# Patient Record
Sex: Female | Born: 1940 | Race: White | Hispanic: Yes | Marital: Single | Smoking: Never smoker
Health system: Southern US, Community
[De-identification: ages and names within clinical notes are randomized; demographics above are authoritative.]

## PROBLEM LIST (undated history)

## (undated) DIAGNOSIS — E785 Hyperlipidemia, unspecified: Secondary | ICD-10-CM

## (undated) DIAGNOSIS — H409 Unspecified glaucoma: Secondary | ICD-10-CM

## (undated) DIAGNOSIS — T4145XA Adverse effect of unspecified anesthetic, initial encounter: Secondary | ICD-10-CM

## (undated) DIAGNOSIS — T8859XA Other complications of anesthesia, initial encounter: Secondary | ICD-10-CM

## (undated) DIAGNOSIS — K219 Gastro-esophageal reflux disease without esophagitis: Secondary | ICD-10-CM

## (undated) DIAGNOSIS — N189 Chronic kidney disease, unspecified: Secondary | ICD-10-CM

## (undated) DIAGNOSIS — M109 Gout, unspecified: Secondary | ICD-10-CM

## (undated) DIAGNOSIS — I10 Essential (primary) hypertension: Secondary | ICD-10-CM

## (undated) DIAGNOSIS — M1990 Unspecified osteoarthritis, unspecified site: Secondary | ICD-10-CM

## (undated) HISTORY — PX: BREAST SURGERY: SHX581

---

## 2004-07-12 ENCOUNTER — Ambulatory Visit: Payer: Self-pay

## 2004-11-08 ENCOUNTER — Ambulatory Visit: Payer: Self-pay | Admitting: Gastroenterology

## 2005-06-24 ENCOUNTER — Ambulatory Visit: Payer: Self-pay

## 2006-01-01 ENCOUNTER — Ambulatory Visit: Payer: Self-pay | Admitting: Family Medicine

## 2008-08-26 ENCOUNTER — Emergency Department: Payer: Self-pay | Admitting: Emergency Medicine

## 2008-08-30 ENCOUNTER — Ambulatory Visit: Payer: Self-pay | Admitting: Nephrology

## 2008-09-30 HISTORY — PX: BREAST EXCISIONAL BIOPSY: SUR124

## 2009-01-26 ENCOUNTER — Ambulatory Visit: Payer: Self-pay | Admitting: Family Medicine

## 2009-02-01 ENCOUNTER — Ambulatory Visit: Payer: Self-pay | Admitting: Family Medicine

## 2009-03-09 ENCOUNTER — Ambulatory Visit: Payer: Self-pay | Admitting: Surgery

## 2009-03-10 ENCOUNTER — Ambulatory Visit: Payer: Self-pay | Admitting: Gastroenterology

## 2009-03-14 ENCOUNTER — Ambulatory Visit: Payer: Self-pay | Admitting: Surgery

## 2009-07-04 DIAGNOSIS — R42 Dizziness and giddiness: Secondary | ICD-10-CM | POA: Insufficient documentation

## 2010-03-20 ENCOUNTER — Ambulatory Visit: Payer: Self-pay | Admitting: Family

## 2011-01-10 ENCOUNTER — Emergency Department: Payer: Self-pay | Admitting: Internal Medicine

## 2011-05-31 ENCOUNTER — Ambulatory Visit: Payer: Self-pay | Admitting: Family Medicine

## 2011-08-29 ENCOUNTER — Ambulatory Visit: Payer: Self-pay | Admitting: Family Medicine

## 2011-09-03 ENCOUNTER — Ambulatory Visit: Payer: Self-pay

## 2012-12-22 ENCOUNTER — Ambulatory Visit: Payer: Self-pay | Admitting: Primary Care

## 2013-12-01 DIAGNOSIS — L723 Sebaceous cyst: Secondary | ICD-10-CM | POA: Insufficient documentation

## 2013-12-21 DIAGNOSIS — R42 Dizziness and giddiness: Secondary | ICD-10-CM | POA: Insufficient documentation

## 2013-12-23 ENCOUNTER — Ambulatory Visit: Payer: Self-pay | Admitting: Primary Care

## 2015-03-14 ENCOUNTER — Ambulatory Visit
Admission: RE | Admit: 2015-03-14 | Discharge: 2015-03-14 | Disposition: A | Discharge status: Home / Self Care | Payer: Medicare Other | Source: Ambulatory Visit | Attending: Primary Care | Admitting: Primary Care

## 2015-03-14 ENCOUNTER — Ambulatory Visit
Admission: RE | Admit: 2015-03-14 | Discharge: 2015-03-14 | Disposition: A | Discharge status: Home / Self Care | Payer: Medicare Other | Source: Ambulatory Visit | Attending: Physician Assistant | Admitting: Physician Assistant

## 2015-03-14 ENCOUNTER — Other Ambulatory Visit: Payer: Self-pay | Admitting: Physician Assistant

## 2015-03-14 DIAGNOSIS — M25562 Pain in left knee: Secondary | ICD-10-CM

## 2015-03-14 DIAGNOSIS — M1712 Unilateral primary osteoarthritis, left knee: Secondary | ICD-10-CM | POA: Diagnosis not present

## 2015-03-14 DIAGNOSIS — M25462 Effusion, left knee: Secondary | ICD-10-CM | POA: Diagnosis not present

## 2015-03-23 DIAGNOSIS — M810 Age-related osteoporosis without current pathological fracture: Secondary | ICD-10-CM | POA: Insufficient documentation

## 2015-03-28 ENCOUNTER — Other Ambulatory Visit: Payer: Self-pay | Admitting: Primary Care

## 2015-03-28 DIAGNOSIS — M810 Age-related osteoporosis without current pathological fracture: Secondary | ICD-10-CM

## 2015-04-04 ENCOUNTER — Ambulatory Visit
Admission: RE | Admit: 2015-04-04 | Discharge: 2015-04-04 | Disposition: A | Discharge status: Home / Self Care | Payer: Medicare Other | Source: Ambulatory Visit | Attending: Primary Care | Admitting: Primary Care

## 2015-04-04 DIAGNOSIS — Z13820 Encounter for screening for osteoporosis: Secondary | ICD-10-CM | POA: Diagnosis not present

## 2015-04-04 DIAGNOSIS — M85852 Other specified disorders of bone density and structure, left thigh: Secondary | ICD-10-CM | POA: Diagnosis not present

## 2015-04-04 DIAGNOSIS — M810 Age-related osteoporosis without current pathological fracture: Secondary | ICD-10-CM

## 2015-04-06 DIAGNOSIS — M5489 Other dorsalgia: Secondary | ICD-10-CM | POA: Insufficient documentation

## 2015-05-17 ENCOUNTER — Other Ambulatory Visit: Payer: Self-pay | Admitting: Orthopedic Surgery

## 2015-05-17 DIAGNOSIS — M25562 Pain in left knee: Secondary | ICD-10-CM

## 2015-05-17 DIAGNOSIS — M1712 Unilateral primary osteoarthritis, left knee: Secondary | ICD-10-CM

## 2015-05-23 ENCOUNTER — Ambulatory Visit
Admission: RE | Admit: 2015-05-23 | Discharge: 2015-05-23 | Disposition: A | Discharge status: Home / Self Care | Payer: Medicare Other | Source: Ambulatory Visit | Attending: Orthopedic Surgery | Admitting: Orthopedic Surgery

## 2015-05-23 DIAGNOSIS — S83282A Other tear of lateral meniscus, current injury, left knee, initial encounter: Secondary | ICD-10-CM | POA: Diagnosis not present

## 2015-05-23 DIAGNOSIS — M25562 Pain in left knee: Secondary | ICD-10-CM | POA: Diagnosis present

## 2015-05-23 DIAGNOSIS — M1712 Unilateral primary osteoarthritis, left knee: Secondary | ICD-10-CM

## 2015-05-23 DIAGNOSIS — X58XXXA Exposure to other specified factors, initial encounter: Secondary | ICD-10-CM | POA: Diagnosis not present

## 2015-05-23 DIAGNOSIS — M25462 Effusion, left knee: Secondary | ICD-10-CM | POA: Diagnosis not present

## 2015-05-23 DIAGNOSIS — S83242A Other tear of medial meniscus, current injury, left knee, initial encounter: Secondary | ICD-10-CM | POA: Insufficient documentation

## 2015-06-21 DIAGNOSIS — L710 Perioral dermatitis: Secondary | ICD-10-CM | POA: Insufficient documentation

## 2015-09-07 ENCOUNTER — Other Ambulatory Visit: Payer: Self-pay | Admitting: Primary Care

## 2015-09-07 DIAGNOSIS — Z1231 Encounter for screening mammogram for malignant neoplasm of breast: Secondary | ICD-10-CM

## 2015-09-19 ENCOUNTER — Other Ambulatory Visit: Payer: Self-pay | Admitting: Primary Care

## 2015-09-19 ENCOUNTER — Ambulatory Visit
Admission: RE | Admit: 2015-09-19 | Discharge: 2015-09-19 | Disposition: A | Discharge status: Home / Self Care | Payer: Medicare Other | Source: Ambulatory Visit | Attending: Primary Care | Admitting: Primary Care

## 2015-09-19 DIAGNOSIS — Z1231 Encounter for screening mammogram for malignant neoplasm of breast: Secondary | ICD-10-CM

## 2015-09-27 ENCOUNTER — Inpatient Hospital Stay: Admission: RE | Admit: 2015-09-27 | Payer: Medicare Other | Source: Ambulatory Visit

## 2015-10-04 ENCOUNTER — Other Ambulatory Visit: Payer: Self-pay

## 2015-10-04 ENCOUNTER — Encounter
Admission: RE | Admit: 2015-10-04 | Discharge: 2015-10-04 | Disposition: A | Discharge status: Home / Self Care | Payer: Medicare Other | Source: Ambulatory Visit | Attending: Orthopedic Surgery | Admitting: Orthopedic Surgery

## 2015-10-04 DIAGNOSIS — M23201 Derangement of unspecified lateral meniscus due to old tear or injury, left knee: Secondary | ICD-10-CM | POA: Diagnosis not present

## 2015-10-04 DIAGNOSIS — M23222 Derangement of posterior horn of medial meniscus due to old tear or injury, left knee: Secondary | ICD-10-CM | POA: Diagnosis not present

## 2015-10-04 DIAGNOSIS — K219 Gastro-esophageal reflux disease without esophagitis: Secondary | ICD-10-CM | POA: Diagnosis not present

## 2015-10-04 DIAGNOSIS — Z79899 Other long term (current) drug therapy: Secondary | ICD-10-CM | POA: Diagnosis not present

## 2015-10-04 DIAGNOSIS — E785 Hyperlipidemia, unspecified: Secondary | ICD-10-CM | POA: Diagnosis not present

## 2015-10-04 DIAGNOSIS — M23232 Derangement of other medial meniscus due to old tear or injury, left knee: Secondary | ICD-10-CM | POA: Diagnosis not present

## 2015-10-04 DIAGNOSIS — Z881 Allergy status to other antibiotic agents status: Secondary | ICD-10-CM | POA: Diagnosis not present

## 2015-10-04 DIAGNOSIS — M1712 Unilateral primary osteoarthritis, left knee: Secondary | ICD-10-CM | POA: Diagnosis not present

## 2015-10-04 DIAGNOSIS — I10 Essential (primary) hypertension: Secondary | ICD-10-CM | POA: Diagnosis not present

## 2015-10-04 DIAGNOSIS — Z888 Allergy status to other drugs, medicaments and biological substances status: Secondary | ICD-10-CM | POA: Diagnosis not present

## 2015-10-04 DIAGNOSIS — S83012A Lateral subluxation of left patella, initial encounter: Secondary | ICD-10-CM | POA: Diagnosis not present

## 2015-10-04 DIAGNOSIS — M179 Osteoarthritis of knee, unspecified: Secondary | ICD-10-CM | POA: Diagnosis present

## 2015-10-04 DIAGNOSIS — X58XXXA Exposure to other specified factors, initial encounter: Secondary | ICD-10-CM | POA: Diagnosis not present

## 2015-10-04 HISTORY — DX: Adverse effect of unspecified anesthetic, initial encounter: T41.45XA

## 2015-10-04 HISTORY — DX: Essential (primary) hypertension: I10

## 2015-10-04 HISTORY — DX: Gastro-esophageal reflux disease without esophagitis: K21.9

## 2015-10-04 HISTORY — DX: Chronic kidney disease, unspecified: N18.9

## 2015-10-04 HISTORY — DX: Hyperlipidemia, unspecified: E78.5

## 2015-10-04 HISTORY — DX: Other complications of anesthesia, initial encounter: T88.59XA

## 2015-10-04 LAB — CBC
HCT: 33.6 % — ABNORMAL LOW (ref 35.0–47.0)
Hemoglobin: 11.2 g/dL — ABNORMAL LOW (ref 12.0–16.0)
MCH: 28 pg (ref 26.0–34.0)
MCHC: 33.5 g/dL (ref 32.0–36.0)
MCV: 83.5 fL (ref 80.0–100.0)
PLATELETS: 219 10*3/uL (ref 150–440)
RBC: 4.02 MIL/uL (ref 3.80–5.20)
RDW: 13.8 % (ref 11.5–14.5)
WBC: 7.6 10*3/uL (ref 3.6–11.0)

## 2015-10-04 NOTE — Patient Instructions (Signed)
  Your procedure is scheduled on: 10/05/15 Thurs Report to Day Surgery.2nd floor medical mall   Remember: Instructions that are not followed completely may result in serious medical risk, up to and including death, or upon the discretion of your surgeon and anesthesiologist your surgery may need to be rescheduled.    _x___ 1. Do not eat food or drink liquids after midnight. No gum chewing or hard candies.     ____ 2. No Alcohol for 24 hours before or after surgery.   ____ 3. Bring all medications with you on the day of surgery if instructed.    _x___ 4. Notify your doctor if there is any change in your medical condition     (cold, fever, infections).     Do not wear jewelry, make-up, hairpins, clips or nail polish.  Do not wear lotions, powders, or perfumes. You may wear deodorant.  Do not shave 48 hours prior to surgery. Men may shave face and neck.  Do not bring valuables to the hospital.    [facility] is not responsible for any belongings or valuables.               Contacts, dentures or bridgework may not be worn into surgery.  Leave your suitcase in the car. After surgery it may be brought to your room.  For patients admitted to the hospital, discharge time is determined by your                treatment team.   Patients discharged the day of surgery will not be allowed to drive home.   Please read over the following fact sheets that you were given:      _x___ Take these medicines the morning of surgery with A SIP OF WATER:    1. diltiazem (DILACOR XR) 240 MG 24 hr capsule  2. omeprazole (PRILOSEC) 40 MG capsule  3.   4.  5.  6.  ____ Fleet Enema (as directed)   _x___ Use CHG Soap as directed  ____ Use inhalers on the day of surgery  ____ Stop metformin 2 days prior to surgery    ____ Take 1/2 of usual insulin dose the night before surgery and none on the morning of surgery.   ____ Stop Coumadin/Plavix/aspirin on   __x__ Stop Anti-inflammatories on  today   __x__ Stop supplements until after surgery.    ____ Bring C-Pap to the hospital.

## 2015-10-05 ENCOUNTER — Encounter
Admission: RE | Disposition: A | Discharge status: Home / Self Care | Payer: Self-pay | Source: Ambulatory Visit | Attending: Orthopedic Surgery

## 2015-10-05 ENCOUNTER — Ambulatory Visit: Payer: Medicare Other | Admitting: Anesthesiology

## 2015-10-05 ENCOUNTER — Ambulatory Visit
Admission: RE | Admit: 2015-10-05 | Discharge: 2015-10-05 | Disposition: A | Discharge status: Home / Self Care | Payer: Medicare Other | Source: Ambulatory Visit | Attending: Orthopedic Surgery | Admitting: Orthopedic Surgery

## 2015-10-05 DIAGNOSIS — M23201 Derangement of unspecified lateral meniscus due to old tear or injury, left knee: Secondary | ICD-10-CM | POA: Insufficient documentation

## 2015-10-05 DIAGNOSIS — X58XXXA Exposure to other specified factors, initial encounter: Secondary | ICD-10-CM | POA: Insufficient documentation

## 2015-10-05 DIAGNOSIS — Z881 Allergy status to other antibiotic agents status: Secondary | ICD-10-CM | POA: Insufficient documentation

## 2015-10-05 DIAGNOSIS — Z79899 Other long term (current) drug therapy: Secondary | ICD-10-CM | POA: Insufficient documentation

## 2015-10-05 DIAGNOSIS — E785 Hyperlipidemia, unspecified: Secondary | ICD-10-CM | POA: Insufficient documentation

## 2015-10-05 DIAGNOSIS — M23232 Derangement of other medial meniscus due to old tear or injury, left knee: Secondary | ICD-10-CM | POA: Insufficient documentation

## 2015-10-05 DIAGNOSIS — M1712 Unilateral primary osteoarthritis, left knee: Secondary | ICD-10-CM | POA: Insufficient documentation

## 2015-10-05 DIAGNOSIS — K219 Gastro-esophageal reflux disease without esophagitis: Secondary | ICD-10-CM | POA: Insufficient documentation

## 2015-10-05 DIAGNOSIS — I10 Essential (primary) hypertension: Secondary | ICD-10-CM | POA: Insufficient documentation

## 2015-10-05 DIAGNOSIS — Z888 Allergy status to other drugs, medicaments and biological substances status: Secondary | ICD-10-CM | POA: Insufficient documentation

## 2015-10-05 DIAGNOSIS — M23222 Derangement of posterior horn of medial meniscus due to old tear or injury, left knee: Secondary | ICD-10-CM | POA: Insufficient documentation

## 2015-10-05 DIAGNOSIS — S83012A Lateral subluxation of left patella, initial encounter: Secondary | ICD-10-CM | POA: Insufficient documentation

## 2015-10-05 HISTORY — PX: KNEE ARTHROSCOPY: SHX127

## 2015-10-05 SURGERY — ARTHROSCOPY, KNEE
Anesthesia: General | Laterality: Left | Wound class: Clean

## 2015-10-05 MED ORDER — MIDAZOLAM HCL 2 MG/2ML IJ SOLN
INTRAMUSCULAR | Status: DC | PRN
  Administered 2015-10-05: 2 mg via INTRAVENOUS

## 2015-10-05 MED ORDER — LIDOCAINE HCL (CARDIAC) 20 MG/ML IV SOLN
INTRAVENOUS | Status: DC | PRN
  Administered 2015-10-05: 30 mg via INTRAVENOUS

## 2015-10-05 MED ORDER — DEXAMETHASONE SODIUM PHOSPHATE 10 MG/ML IJ SOLN
INTRAMUSCULAR | Status: DC | PRN
  Administered 2015-10-05: 4 mg via INTRAVENOUS

## 2015-10-05 MED ORDER — ONDANSETRON HCL 4 MG/2ML IJ SOLN: 4.0000 mg | Freq: Four times a day (QID) | INTRAMUSCULAR | Status: DC | PRN

## 2015-10-05 MED ORDER — PROMETHAZINE HCL 25 MG/ML IJ SOLN
INTRAMUSCULAR | Status: AC
  Filled 2015-10-05: qty 1

## 2015-10-05 MED ORDER — SODIUM CHLORIDE 0.9 % IJ SOLN
INTRAMUSCULAR | Status: AC
  Filled 2015-10-05: qty 10

## 2015-10-05 MED ORDER — KETOROLAC TROMETHAMINE 30 MG/ML IJ SOLN
INTRAMUSCULAR | Status: DC | PRN
  Administered 2015-10-05: 30 mg via INTRAVENOUS

## 2015-10-05 MED ORDER — BUPIVACAINE-EPINEPHRINE (PF) 0.5% -1:200000 IJ SOLN
INTRAMUSCULAR | Status: AC
  Filled 2015-10-05: qty 30

## 2015-10-05 MED ORDER — FENTANYL CITRATE (PF) 100 MCG/2ML IJ SOLN
INTRAMUSCULAR | Status: AC
  Administered 2015-10-05: 25 ug via INTRAVENOUS
  Filled 2015-10-05: qty 2

## 2015-10-05 MED ORDER — FENTANYL CITRATE (PF) 100 MCG/2ML IJ SOLN
INTRAMUSCULAR | Status: DC | PRN
  Administered 2015-10-05: 100 ug via INTRAVENOUS

## 2015-10-05 MED ORDER — ONDANSETRON HCL 4 MG PO TABS: 4.0000 mg | ORAL_TABLET | Freq: Four times a day (QID) | ORAL | Status: DC | PRN

## 2015-10-05 MED ORDER — HYDROCODONE-ACETAMINOPHEN 5-325 MG PO TABS: 1.0000 | ORAL_TABLET | ORAL | Status: DC | PRN

## 2015-10-05 MED ORDER — SODIUM CHLORIDE 0.9 % IV SOLN: INTRAVENOUS | Status: DC

## 2015-10-05 MED ORDER — METOCLOPRAMIDE HCL 5 MG/ML IJ SOLN: 5.0000 mg | Freq: Three times a day (TID) | INTRAMUSCULAR | Status: DC | PRN

## 2015-10-05 MED ORDER — LACTATED RINGERS IV SOLN
INTRAVENOUS | Status: DC
  Administered 2015-10-05 (×2): via INTRAVENOUS

## 2015-10-05 MED ORDER — FENTANYL CITRATE (PF) 100 MCG/2ML IJ SOLN
25.0000 ug | INTRAMUSCULAR | Status: DC | PRN
  Administered 2015-10-05 (×2): 25 ug via INTRAVENOUS

## 2015-10-05 MED ORDER — ONDANSETRON HCL 4 MG/2ML IJ SOLN
INTRAMUSCULAR | Status: DC | PRN
  Administered 2015-10-05: 4 mg via INTRAVENOUS

## 2015-10-05 MED ORDER — PROPOFOL 10 MG/ML IV BOLUS
INTRAVENOUS | Status: DC | PRN
  Administered 2015-10-05: 150 mg via INTRAVENOUS

## 2015-10-05 MED ORDER — HYDROCODONE-ACETAMINOPHEN 5-325 MG PO TABS: 1.0000 | ORAL_TABLET | Freq: Four times a day (QID) | ORAL | Status: DC | PRN

## 2015-10-05 MED ORDER — PROMETHAZINE HCL 25 MG/ML IJ SOLN
6.2500 mg | Freq: Once | INTRAMUSCULAR | Status: AC
  Administered 2015-10-05: 6.25 mg via INTRAVENOUS

## 2015-10-05 MED ORDER — PROMETHAZINE HCL 25 MG/ML IJ SOLN
INTRAMUSCULAR | Status: AC
  Administered 2015-10-05: 6.25 mg via INTRAVENOUS
  Filled 2015-10-05: qty 1

## 2015-10-05 MED ORDER — ONDANSETRON HCL 4 MG/2ML IJ SOLN
INTRAMUSCULAR | Status: AC
  Administered 2015-10-05: 4 mg via INTRAVENOUS
  Filled 2015-10-05: qty 2

## 2015-10-05 MED ORDER — ONDANSETRON HCL 4 MG/2ML IJ SOLN
4.0000 mg | Freq: Once | INTRAMUSCULAR | Status: AC | PRN
  Administered 2015-10-05: 4 mg via INTRAVENOUS

## 2015-10-05 MED ORDER — BUPIVACAINE-EPINEPHRINE (PF) 0.5% -1:200000 IJ SOLN
INTRAMUSCULAR | Status: DC | PRN
  Administered 2015-10-05: 30 mL

## 2015-10-05 MED ORDER — METOCLOPRAMIDE HCL 10 MG PO TABS: 5.0000 mg | ORAL_TABLET | Freq: Three times a day (TID) | ORAL | Status: DC | PRN

## 2015-10-05 SURGICAL SUPPLY — 28 items
BANDAGE ACE 4X5 VEL STRL LF (GAUZE/BANDAGES/DRESSINGS) ×3 IMPLANT
BANDAGE ELASTIC 4 LF NS (GAUZE/BANDAGES/DRESSINGS) ×3 IMPLANT
BLADE FULL RADIUS 3.5 (BLADE) IMPLANT
BLADE INCISOR PLUS 4.5 (BLADE) ×3 IMPLANT
BLADE SHAVER 4.5 DBL SERAT CV (CUTTER) IMPLANT
BLADE SHAVER 4.5X7 STR FR (MISCELLANEOUS) IMPLANT
CHLORAPREP W/TINT 26ML (MISCELLANEOUS) ×3 IMPLANT
CUTTER AGGRESSIVE+ 3.5 (CUTTER) IMPLANT
GAUZE PETRO XEROFOAM 1X8 (MISCELLANEOUS) ×3 IMPLANT
GAUZE SPONGE 4X4 12PLY STRL (GAUZE/BANDAGES/DRESSINGS) ×3 IMPLANT
GLOVE BIOGEL PI IND STRL 9 (GLOVE) ×1 IMPLANT
GLOVE BIOGEL PI INDICATOR 9 (GLOVE) ×2
GLOVE SURG ORTHO 9.0 STRL STRW (GLOVE) ×3 IMPLANT
GOWN SPECIALTY ULTRA XL (MISCELLANEOUS) ×3 IMPLANT
GOWN STRL REUS W/ TWL LRG LVL3 (GOWN DISPOSABLE) ×1 IMPLANT
GOWN STRL REUS W/TWL LRG LVL3 (GOWN DISPOSABLE) ×2
IV LACTATED RINGER IRRG 3000ML (IV SOLUTION) ×8
IV LR IRRIG 3000ML ARTHROMATIC (IV SOLUTION) ×4 IMPLANT
KIT RM TURNOVER STRD PROC AR (KITS) ×3 IMPLANT
MANIFOLD NEPTUNE II (INSTRUMENTS) ×3 IMPLANT
PACK ARTHROSCOPY KNEE (MISCELLANEOUS) ×3 IMPLANT
SET TUBE SUCT SHAVER OUTFL 24K (TUBING) ×3 IMPLANT
SET TUBE TIP INTRA-ARTICULAR (MISCELLANEOUS) ×3 IMPLANT
SUT ETHILON 4-0 (SUTURE) ×2
SUT ETHILON 4-0 FS2 18XMFL BLK (SUTURE) ×1
SUTURE ETHLN 4-0 FS2 18XMF BLK (SUTURE) ×1 IMPLANT
TUBING ARTHRO INFLOW-ONLY STRL (TUBING) ×3 IMPLANT
WAND HAND CNTRL MULTIVAC 50 (MISCELLANEOUS) ×3 IMPLANT

## 2015-10-05 NOTE — OR Nursing (Signed)
2+ pulses in both feet.

## 2015-10-05 NOTE — Transfer of Care (Signed)
Immediate Anesthesia Transfer of Care Note  Patient: Leah Davies  Procedure(s) Performed: Procedure(s): ARTHROSCOPY KNEE partial medial and lateral meniscectomy, lateral release for sublux patella (Left)  Patient Location: PACU  Anesthesia Type:General  Level of Consciousness: alert  and oriented  Airway & Oxygen Therapy: Patient Spontanous Breathing and Patient connected to face mask oxygen  Post-op Assessment: Report given to RN and Post -op Vital signs reviewed and stable  Post vital signs: Reviewed and stable  Last Vitals:  Filed Vitals:   10/05/15 1045 10/05/15 1415  BP: 141/59 114/52  Pulse: 80 68  Temp: 36.7 C 36.2 C  Resp: 16 16    Complications: No apparent anesthesia complications

## 2015-10-05 NOTE — Anesthesia Postprocedure Evaluation (Signed)
Anesthesia Post Note  Patient: Leah Davies  Procedure(s) Performed: Procedure(s) (LRB): ARTHROSCOPY KNEE partial medial and lateral meniscectomy, lateral release for sublux patella (Left)  Patient location during evaluation: PACU Anesthesia Type: General Level of consciousness: awake Pain management: pain level controlled Vital Signs Assessment: post-procedure vital signs reviewed and stable Respiratory status: spontaneous breathing Anesthetic complications: no    Last Vitals:  Filed Vitals:   10/05/15 1415 10/05/15 1430  BP: 114/52 133/57  Pulse: 68 67  Temp: 36.2 C   Resp: 16 15    Last Pain:  Filed Vitals:   10/05/15 1436  PainSc: Asleep                 VAN STAVEREN,Abbagail Scaff

## 2015-10-05 NOTE — Discharge Instructions (Addendum)
Keep dressing clean and dry. Weight-bear as tolerated on left leg Take one aspirin a day 81 or 325 mg.  Mantenga el vendaje limpio y seco.  Puede poner peso en la pierna izquierda segn lo tolere. Tome una aspirina de 81 o 325 mg. cada da.    Anestesia general, adultos, cuidados posteriores (General Anesthesia, Adult, Care After) Siga estas instrucciones durante las prximas semanas. Estas indicaciones le proporcionan informacin acerca de cmo deber cuidarse despus del procedimiento. El mdico tambin podr darle instrucciones ms especficas. El tratamiento se ha planificado de acuerdo a las prcticas mdicas actuales, pero a veces se producen problemas. Comunquese con el mdico si tiene algn problema o tiene dudas despus del procedimiento. QU ESPERAR DESPUS DEL PROCEDIMIENTO Despus del procedimiento es habitual experimentar:  Somnolencia.  Nuseas y vmitos. INSTRUCCIONES PARA EL CUIDADO EN EL HOGAR  Durante las primeras 24 horas luego de la anestesia general:  Haga que una persona responsable se quede con usted.  No conduzca un automvil. Si est solo, no viaje en transporte pblico.  No beba alcohol.  No tome medicamentos que no le haya recetado su mdico.  No firme documentos importantes ni tome decisiones trascendentes.  Puede reanudar su dieta y sus actividades normales segn le haya indicado el mdico.  Cambie los vendajes (apsitos) tal como se le indic.  Si tiene preguntas o se le presenta algn problema relacionado con la anestesia general, comunquese con el hospital y pida por el anestesista o anestesilogo de [place]. SOLICITE ATENCIN MDICA SI:  Tiene nuseas y [place] posterior a la anestesia.  Le aparece una erupcin cutnea. SOLICITE ATENCIN MDICA DE INMEDIATO SI:   Tiene dificultad para respirar.  Siente dolor en el pecho.  Tiene algn problema alrgico.   Esta informacin no tiene como fin reemplazar el consejo del  mdico. Asegrese de hacerle al mdico cualquier pregunta que tenga.   Document Released: 09/16/2005 Document Revised: 10/07/2014 Elsevier Interactive Patient Education [employer].

## 2015-10-05 NOTE — Anesthesia Preprocedure Evaluation (Addendum)
Anesthesia Evaluation  Patient identified by MRN, date of birth, ID band Patient awake    Reviewed: Allergy & Precautions, NPO status , Patient's Chart, lab work & pertinent test results  Airway Mallampati: III       Dental  (+) Implants   Pulmonary neg pulmonary ROS,    Pulmonary exam normal        Cardiovascular Exercise Tolerance: Good hypertension, Pt. on medications  Rhythm:Regular Rate:Normal     Neuro/Psych negative neurological ROS     GI/Hepatic Neg liver ROS, GERD  ,  Endo/Other  negative endocrine ROS  Renal/GU      Musculoskeletal   Abdominal Normal abdominal exam  (+)   Peds  Hematology negative hematology ROS (+)   Anesthesia Other Findings   Reproductive/Obstetrics                            Anesthesia Physical Anesthesia Plan  ASA: III  Anesthesia Plan: General   Post-op Pain Management:    Induction: Intravenous  Airway Management Planned: LMA  Additional Equipment:   Intra-op Plan:   Post-operative Plan: Extubation in OR  Informed Consent: I have reviewed the patients History and Physical, chart, labs and discussed the procedure including the risks, benefits and alternatives for the proposed anesthesia with the patient or authorized representative who has indicated his/her understanding and acceptance.     Plan Discussed with: CRNA  Anesthesia Plan Comments:         Anesthesia Quick Evaluation

## 2015-10-05 NOTE — OR Nursing (Signed)
Dr Rudene Christians in discussed consent for blood transfusion refusal. Patient and family consent to no blood products because she is Jehovah Witness.  Consent signed.

## 2015-10-05 NOTE — H&P (Signed)
Reviewed paper H+P, will be scanned into chart. No changes noted.  

## 2015-10-05 NOTE — Op Note (Signed)
10/05/2015  2:16 PM  PATIENT:  Leah Davies  75 y.o. 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 PROCEDURE:  Procedure(s): ARTHROSCOPY KNEE partial medial and lateral meniscectomy, lateral release for sublux patella (Left)  SURGEON: Laurene Footman, MD  ASSISTANTS: None  ANESTHESIA:   general  EBL:  Total I/O In: 700 [I.V.:700] Out: -   BLOOD ADMINISTERED:none  DRAINS: none   LOCAL MEDICATIONS USED:  MARCAINE     SPECIMEN:  No Specimen  DISPOSITION OF SPECIMEN:  N/A  COUNTS:  YES  TOURNIQUET:   13 minutes at 300 mmHg  IMPLANTS: None  DICTATION: .Dragon Dictation patient brought the operating room and after adequate general anesthesia was obtained the left leg was placed in the arthroscopic leg holder with a tourniquet and the leg prepped and draped in the usual sterile fashion. After appropriate patient identification and timeout procedures were completed, an inferolateral portal was made and on and initial inspection the patella was subluxed laterally with a tight lateral retinaculum with moderate central osteoarthritis with fissuring down to the bone. Coming around medially and inferior medial portal was made on probing there is a complex tear of the posterior and middle thirds of the meniscus with both radial and horizontal tears involving the inner half of the meniscus. The articular cartilage showed fissuring and grade 2-3 changes but no bone exposed. The notch showed anterior cruciate ligament intact with moderate synovitis. Going laterally there is a very complex tear there is of the lateral meniscus and had a  partial discoid appearance and had detachment in its midportion off the capsule so a partial meniscectomy was carried out resecting most of the middle third of the meniscus. The articular cartilage is relatively spared a few  areas of articular cartilage loss. At this point the lateral release was carried out and the patella tracked much better after lateral release. The knee was thoroughly irrigated until clear. Tourniquet was raised prior to the lateral release to allow for better visualization and tourniquet was let down prior to close the case. Wounds were closed with simple 4-0 nylon followed by injection of a total of 30 cc half percent Sensorcaine with epinephrine into the portals and area of the lateral release. Xeroform 4 x 4 web roll and Ace wrap applied   PLAN OF CARE: Discharge to home after PACU  PATIENT DISPOSITION:  PACU - hemodynamically stable.

## 2015-10-06 ENCOUNTER — Encounter: Payer: Self-pay | Admitting: Orthopedic Surgery

## 2016-06-26 DIAGNOSIS — R140 Abdominal distension (gaseous): Secondary | ICD-10-CM | POA: Insufficient documentation

## 2016-10-07 ENCOUNTER — Encounter: Payer: Self-pay | Admitting: Emergency Medicine

## 2016-10-07 ENCOUNTER — Inpatient Hospital Stay
Admission: EM | Admit: 2016-10-07 | Discharge: 2016-10-09 | DRG: 683 | Disposition: A | Discharge status: Home / Self Care | Payer: Medicare Other | Attending: Internal Medicine | Admitting: Internal Medicine

## 2016-10-07 ENCOUNTER — Emergency Department: Payer: Medicare Other

## 2016-10-07 ENCOUNTER — Inpatient Hospital Stay: Payer: Medicare Other

## 2016-10-07 DIAGNOSIS — R110 Nausea: Secondary | ICD-10-CM

## 2016-10-07 DIAGNOSIS — T464X5A Adverse effect of angiotensin-converting-enzyme inhibitors, initial encounter: Secondary | ICD-10-CM | POA: Diagnosis present

## 2016-10-07 DIAGNOSIS — J40 Bronchitis, not specified as acute or chronic: Secondary | ICD-10-CM | POA: Diagnosis present

## 2016-10-07 DIAGNOSIS — N183 Chronic kidney disease, stage 3 (moderate): Secondary | ICD-10-CM | POA: Diagnosis present

## 2016-10-07 DIAGNOSIS — I472 Ventricular tachycardia: Secondary | ICD-10-CM | POA: Diagnosis not present

## 2016-10-07 DIAGNOSIS — I129 Hypertensive chronic kidney disease with stage 1 through stage 4 chronic kidney disease, or unspecified chronic kidney disease: Secondary | ICD-10-CM | POA: Diagnosis present

## 2016-10-07 DIAGNOSIS — N179 Acute kidney failure, unspecified: Secondary | ICD-10-CM

## 2016-10-07 DIAGNOSIS — E875 Hyperkalemia: Secondary | ICD-10-CM | POA: Diagnosis present

## 2016-10-07 DIAGNOSIS — N289 Disorder of kidney and ureter, unspecified: Secondary | ICD-10-CM

## 2016-10-07 DIAGNOSIS — K219 Gastro-esophageal reflux disease without esophagitis: Secondary | ICD-10-CM | POA: Diagnosis present

## 2016-10-07 DIAGNOSIS — R55 Syncope and collapse: Secondary | ICD-10-CM | POA: Diagnosis not present

## 2016-10-07 DIAGNOSIS — R262 Difficulty in walking, not elsewhere classified: Secondary | ICD-10-CM

## 2016-10-07 DIAGNOSIS — I959 Hypotension, unspecified: Secondary | ICD-10-CM | POA: Diagnosis present

## 2016-10-07 DIAGNOSIS — Z23 Encounter for immunization: Secondary | ICD-10-CM | POA: Diagnosis present

## 2016-10-07 DIAGNOSIS — E860 Dehydration: Secondary | ICD-10-CM | POA: Diagnosis present

## 2016-10-07 LAB — COMPREHENSIVE METABOLIC PANEL
ALK PHOS: 101 U/L (ref 38–126)
ALT: 16 U/L (ref 14–54)
AST: 21 U/L (ref 15–41)
Albumin: 3.6 g/dL (ref 3.5–5.0)
Anion gap: 6 (ref 5–15)
BILIRUBIN TOTAL: 0.7 mg/dL (ref 0.3–1.2)
BUN: 61 mg/dL — AB (ref 6–20)
CHLORIDE: 111 mmol/L (ref 101–111)
CO2: 20 mmol/L — AB (ref 22–32)
CREATININE: 3.93 mg/dL — AB (ref 0.44–1.00)
Calcium: 8 mg/dL — ABNORMAL LOW (ref 8.9–10.3)
GFR calc Af Amer: 12 mL/min — ABNORMAL LOW (ref >60)
GFR calc non Af Amer: 10 mL/min — ABNORMAL LOW (ref >60)
GLUCOSE: 124 mg/dL — AB (ref 65–99)
Potassium: 6 mmol/L — ABNORMAL HIGH (ref 3.5–5.1)
SODIUM: 137 mmol/L (ref 135–145)
Total Protein: 7 g/dL (ref 6.5–8.1)

## 2016-10-07 LAB — CBC WITH DIFFERENTIAL/PLATELET
Basophils Absolute: 0 10*3/uL (ref 0–0.1)
Basophils Relative: 0 %
EOS ABS: 0 10*3/uL (ref 0–0.7)
Eosinophils Relative: 0 %
HEMATOCRIT: 33.3 % — AB (ref 35.0–47.0)
HEMOGLOBIN: 10.9 g/dL — AB (ref 12.0–16.0)
LYMPHS ABS: 3.4 10*3/uL (ref 1.0–3.6)
Lymphocytes Relative: 45 %
MCH: 28.6 pg (ref 26.0–34.0)
MCHC: 32.8 g/dL (ref 32.0–36.0)
MCV: 87.3 fL (ref 80.0–100.0)
MONO ABS: 0.6 10*3/uL (ref 0.2–0.9)
Monocytes Relative: 8 %
NEUTROS PCT: 47 %
Neutro Abs: 3.5 10*3/uL (ref 1.4–6.5)
Platelets: 184 10*3/uL (ref 150–440)
RBC: 3.81 MIL/uL (ref 3.80–5.20)
RDW: 13.8 % (ref 11.5–14.5)
WBC: 7.6 10*3/uL (ref 3.6–11.0)

## 2016-10-07 LAB — URINALYSIS, COMPLETE (UACMP) WITH MICROSCOPIC
BILIRUBIN URINE: NEGATIVE
Bacteria, UA: NONE SEEN
Ketones, ur: NEGATIVE mg/dL
NITRITE: NEGATIVE
PROTEIN: 30 mg/dL — AB
Specific Gravity, Urine: 1.006 (ref 1.005–1.030)
pH: 6 (ref 5.0–8.0)

## 2016-10-07 LAB — POTASSIUM
POTASSIUM: 5.2 mmol/L — AB (ref 3.5–5.1)
POTASSIUM: 4.5 mmol/L (ref 3.5–5.1)

## 2016-10-07 LAB — GLUCOSE, CAPILLARY: Glucose-Capillary: 204 mg/dL — ABNORMAL HIGH (ref 65–99)

## 2016-10-07 LAB — TROPONIN I

## 2016-10-07 LAB — MAGNESIUM: MAGNESIUM: 1.9 mg/dL (ref 1.7–2.4)

## 2016-10-07 MED ORDER — BISACODYL 5 MG PO TBEC: 5.0000 mg | DELAYED_RELEASE_TABLET | Freq: Every day | ORAL | Status: DC | PRN

## 2016-10-07 MED ORDER — SODIUM POLYSTYRENE SULFONATE 15 GM/60ML PO SUSP
30.0000 g | Freq: Once | ORAL | Status: AC
  Administered 2016-10-07: 30 g via ORAL
  Filled 2016-10-07: qty 120

## 2016-10-07 MED ORDER — DEXTROSE 50 % IV SOLN
50.0000 g | Freq: Once | INTRAVENOUS | Status: AC
  Administered 2016-10-07: 50 g via INTRAVENOUS
  Filled 2016-10-07: qty 100

## 2016-10-07 MED ORDER — SODIUM BICARBONATE 8.4 % IV SOLN
50.0000 meq | Freq: Once | INTRAVENOUS | Status: AC
  Administered 2016-10-07: 50 meq via INTRAVENOUS
  Filled 2016-10-07: qty 50

## 2016-10-07 MED ORDER — KETOROLAC TROMETHAMINE 15 MG/ML IJ SOLN: 15.0000 mg | Freq: Four times a day (QID) | INTRAMUSCULAR | Status: DC | PRN

## 2016-10-07 MED ORDER — PANTOPRAZOLE SODIUM 40 MG PO TBEC
80.0000 mg | DELAYED_RELEASE_TABLET | Freq: Every day | ORAL | Status: DC
  Administered 2016-10-08 – 2016-10-09 (×2): 80 mg via ORAL
  Filled 2016-10-07 (×2): qty 2

## 2016-10-07 MED ORDER — ACETAMINOPHEN 325 MG PO TABS
650.0000 mg | ORAL_TABLET | Freq: Four times a day (QID) | ORAL | Status: DC | PRN
  Administered 2016-10-08: 650 mg via ORAL
  Filled 2016-10-07: qty 2

## 2016-10-07 MED ORDER — DEXTROSE 5 % IV BOLUS
1000.0000 mL | Freq: Once | INTRAVENOUS | Status: AC
Start: 2016-10-07 — End: 2016-10-07
  Administered 2016-10-07: 1000 mL via INTRAVENOUS

## 2016-10-07 MED ORDER — ACETAMINOPHEN 650 MG RE SUPP
650.0000 mg | Freq: Four times a day (QID) | RECTAL | Status: DC | PRN
Start: 2016-10-07 — End: 2016-10-09

## 2016-10-07 MED ORDER — INSULIN ASPART 100 UNIT/ML IV SOLN
10.0000 [IU] | Freq: Once | INTRAVENOUS | Status: AC
  Administered 2016-10-07: 10 [IU] via INTRAVENOUS
  Filled 2016-10-07: qty 0.1

## 2016-10-07 MED ORDER — SODIUM CHLORIDE 0.9% FLUSH
3.0000 mL | Freq: Two times a day (BID) | INTRAVENOUS | Status: DC
  Administered 2016-10-07 – 2016-10-08 (×3): 3 mL via INTRAVENOUS

## 2016-10-07 MED ORDER — TRAMADOL HCL 50 MG PO TABS: 50.0000 mg | ORAL_TABLET | Freq: Four times a day (QID) | ORAL | Status: DC | PRN

## 2016-10-07 MED ORDER — ONDANSETRON HCL 4 MG/2ML IJ SOLN: 4.0000 mg | Freq: Four times a day (QID) | INTRAMUSCULAR | Status: DC | PRN

## 2016-10-07 MED ORDER — SODIUM CHLORIDE 0.9 % IV SOLN
INTRAVENOUS | Status: DC
  Administered 2016-10-07 – 2016-10-09 (×3): via INTRAVENOUS

## 2016-10-07 MED ORDER — MECLIZINE HCL 25 MG PO TABS: 25.0000 mg | ORAL_TABLET | Freq: Three times a day (TID) | ORAL | Status: DC | PRN

## 2016-10-07 MED ORDER — HEPARIN SODIUM (PORCINE) 5000 UNIT/ML IJ SOLN
5000.0000 [IU] | Freq: Three times a day (TID) | INTRAMUSCULAR | Status: DC
  Administered 2016-10-07 – 2016-10-09 (×4): 5000 [IU] via SUBCUTANEOUS
  Filled 2016-10-07 (×5): qty 1

## 2016-10-07 MED ORDER — HYDROCOD POLST-CPM POLST ER 10-8 MG/5ML PO SUER
5.0000 mL | Freq: Two times a day (BID) | ORAL | Status: DC | PRN
  Administered 2016-10-08 – 2016-10-09 (×2): 5 mL via ORAL
  Filled 2016-10-07 (×2): qty 5

## 2016-10-07 MED ORDER — AZITHROMYCIN 250 MG PO TABS
250.0000 mg | ORAL_TABLET | Freq: Every day | ORAL | Status: AC
  Administered 2016-10-08 – 2016-10-09 (×2): 250 mg via ORAL
  Filled 2016-10-07 (×2): qty 1

## 2016-10-07 MED ORDER — ONDANSETRON HCL 4 MG PO TABS: 4.0000 mg | ORAL_TABLET | Freq: Four times a day (QID) | ORAL | Status: DC | PRN

## 2016-10-07 MED ORDER — ONDANSETRON HCL 4 MG/2ML IJ SOLN
4.0000 mg | Freq: Once | INTRAMUSCULAR | Status: AC
  Administered 2016-10-07: 4 mg via INTRAVENOUS
  Filled 2016-10-07: qty 2

## 2016-10-07 MED ORDER — PNEUMOCOCCAL VAC POLYVALENT 25 MCG/0.5ML IJ INJ
0.5000 mL | INJECTION | INTRAMUSCULAR | Status: AC
  Administered 2016-10-08: 0.5 mL via INTRAMUSCULAR
  Filled 2016-10-07: qty 0.5

## 2016-10-07 MED ORDER — FAMOTIDINE IN NACL 20-0.9 MG/50ML-% IV SOLN
20.0000 mg | Freq: Once | INTRAVENOUS | Status: AC
  Administered 2016-10-07: 20 mg via INTRAVENOUS
  Filled 2016-10-07: qty 50

## 2016-10-07 MED ORDER — CALCIUM GLUCONATE 10 % IV SOLN
1.0000 g | Freq: Once | INTRAVENOUS | Status: AC
  Administered 2016-10-07: 1 g via INTRAVENOUS
  Filled 2016-10-07: qty 10

## 2016-10-07 MED ORDER — INSULIN ASPART 100 UNIT/ML ~~LOC~~ SOLN
SUBCUTANEOUS | Status: AC
  Filled 2016-10-07: qty 10

## 2016-10-07 MED ORDER — LACTATED RINGERS IV SOLN
INTRAVENOUS | Status: AC
  Administered 2016-10-07: 14:00:00 via INTRAVENOUS

## 2016-10-07 MED ORDER — AZITHROMYCIN 250 MG PO TABS: 250.0000 mg | ORAL_TABLET | Freq: Every day | ORAL | Status: DC

## 2016-10-07 MED ORDER — DEXTROSE 50 % IV SOLN
25.0000 mL | Freq: Once | INTRAVENOUS | Status: AC
  Administered 2016-10-07: 25 mL via INTRAVENOUS

## 2016-10-07 MED ORDER — SENNOSIDES-DOCUSATE SODIUM 8.6-50 MG PO TABS: 1.0000 | ORAL_TABLET | Freq: Every evening | ORAL | Status: DC | PRN

## 2016-10-07 MED ORDER — INSULIN ASPART 100 UNIT/ML ~~LOC~~ SOLN
10.0000 [IU] | Freq: Once | SUBCUTANEOUS | Status: AC
  Administered 2016-10-07: 10 [IU] via INTRAVENOUS
  Filled 2016-10-07: qty 10

## 2016-10-07 NOTE — ED Notes (Signed)
Given something to drink. Ok per dr Joni Fears

## 2016-10-07 NOTE — ED Triage Notes (Signed)
Arrived alert and pale via ems.  Reports syncopal episode

## 2016-10-07 NOTE — H&P (Addendum)
[place] [facility] [place] NAME: Leah Davies    MR#:  [id number]  DATE OF BIRTH:  August 09, 1941  DATE OF ADMISSION:  10/07/2016  PRIMARY CARE PHYSICIAN: Freddy Finner, NP   REQUESTING/REFERRING PHYSICIAN: Dr. Joni Fears  CHIEF COMPLAINT:   Weakness HISTORY OF PRESENT ILLNESS:  Leah Davies  is a 76 y.o. female with a known history of Chronic kidney disease stage III with a baseline creatinine of 2.2 and essential hypertension who presents with above complaint. Patient was seen at current total clinic about a week ago and prescribed Z-Pak, Tessalon and Zanaflex for sore throat, body aches and congestion. Since that time she has had decreased appetite, increasing cough, weakness in this morning she had a syncopal episode. Her blood pressure on arrival was 80/50. She is also noted to have acute kidney injury and hyperkalemia.. She reports she has been taking her medications despite not feeling well over the past week. She is given D50 and insulin in the emergency room as well as IV fluids.  She follows at [facility] nephrology. Yes  PAST MEDICAL HISTORY:   Past Medical History:  Diagnosis Date  . Chronic kidney disease    renal insufficiency  . Complication of anesthesia    Vital signs low during colonoscopy  . Elevated lipids   . GERD (gastroesophageal reflux disease)   . Hypertension     PAST SURGICAL HISTORY:   Past Surgical History:  Procedure Laterality Date  . BREAST EXCISIONAL BIOPSY Left 2010   benign  . BREAST SURGERY     Lt breast biopsy  . KNEE ARTHROSCOPY Left 10/05/2015   Procedure: ARTHROSCOPY KNEE partial medial and lateral meniscectomy, lateral release for sublux patella;  Surgeon: Hessie Knows, MD;  Location: [facility] ORS;  Service: Orthopedics;  Laterality: Left;    SOCIAL HISTORY:   Social History  Substance Use Topics  . Smoking status: Never Smoker  . Smokeless tobacco: Never Used  . Alcohol use Yes     Comment: rare    FAMILY  HISTORY:  No history of CAD  DRUG ALLERGIES:  No Known Allergies  REVIEW OF SYSTEMS:   Review of Systems  Constitutional: Positive for malaise/fatigue. Negative for chills and fever.  HENT: Negative.  Negative for ear discharge, ear pain, hearing loss, nosebleeds and sore throat.   Eyes: Negative.  Negative for blurred vision and pain.  Respiratory: Positive for cough. Negative for hemoptysis, shortness of breath and wheezing.   Cardiovascular: Negative.  Negative for chest pain, palpitations and leg swelling.  Gastrointestinal: Positive for nausea and vomiting. Negative for abdominal pain, blood in stool and diarrhea.  Genitourinary: Negative.  Negative for dysuria.  Musculoskeletal: Negative.  Negative for back pain.  Skin: Negative.   Neurological: Positive for weakness. Negative for dizziness, tremors, speech change, focal weakness, seizures and headaches.  Endo/Heme/Allergies: Negative.  Does not bruise/bleed easily.  Psychiatric/Behavioral: Negative.  Negative for depression, hallucinations and suicidal ideas.    MEDICATIONS AT HOME:   Prior to Admission medications   Medication Sig Start Date End Date Taking? Authorizing Provider  azithromycin (ZITHROMAX) 250 MG tablet Take 250-500 mg by mouth daily. Take 2 tablets (500 mg) by mouth on day 1, then take 1 tablet (250 mg) on days 2-5. 10/05/16 10/10/16 Yes Historical Provider, MD  chlorpheniramine-HYDROcodone (TUSSIONEX) 10-8 MG/5ML SUER Take 5 mLs by mouth every 12 (twelve) hours as needed for cough. 10/05/16 10/15/16 Yes Historical Provider, MD  lisinopril (PRINIVIL,ZESTRIL) 10 MG tablet Take 10 mg  by mouth daily.   Yes Historical Provider, MD  meclizine (ANTIVERT) 25 MG tablet Take 25 mg by mouth 3 (three) times daily as needed for dizziness. Reported on 10/05/2015   Yes Historical Provider, MD  omeprazole (PRILOSEC) 40 MG capsule Take 40 mg by mouth daily.   Yes Historical Provider, MD  tiZANidine (ZANAFLEX) 4 MG tablet Take 4 mg by  mouth at bedtime. 10/05/16  Yes Historical Provider, MD  HYDROcodone-acetaminophen (NORCO) 5-325 MG tablet Take 1-2 tablets by mouth every 6 (six) hours as needed for moderate pain. Patient not taking: Reported on 10/07/2016 10/05/15   Hessie Knows, MD      VITAL SIGNS:  Blood pressure (!) 148/62, pulse 68, temperature 97.9 F (36.6 C), temperature source Oral, resp. rate 12, height 5\' 2"  (1.575 m), weight 68 kg (150 lb), SpO2 98 %.  PHYSICAL EXAMINATION:   Physical Exam  Constitutional: She is oriented to person, place, and time and well-developed, well-nourished, and in no distress. No distress.  HENT:  Head: Normocephalic.  Eyes: No scleral icterus.  Neck: Normal range of motion. Neck supple. No JVD present. No tracheal deviation present.  Cardiovascular: Normal rate, regular rhythm and normal heart sounds.  Exam reveals no gallop and no friction rub.   No murmur heard. Pulmonary/Chest: Effort normal and breath sounds normal. No respiratory distress. She has no wheezes. She has no rales. She exhibits no tenderness.  Abdominal: Soft. Bowel sounds are normal. She exhibits no distension and no mass. There is no tenderness. There is no rebound and no guarding.  Musculoskeletal: Normal range of motion. She exhibits no edema.  Neurological: She is alert and oriented to person, place, and time.  Skin: Skin is warm. No rash noted. No erythema.  Psychiatric: Affect and judgment normal.      LABORATORY PANEL:   CBC  Recent Labs Lab 10/07/16 1155  WBC 7.6  HGB 10.9*  HCT 33.3*  PLT 184   ------------------------------------------------------------------------------------------------------------------  Chemistries   Recent Labs Lab 10/07/16 1155  NA 137  K 6.0*  CL 111  CO2 20*  GLUCOSE 124*  BUN 61*  CREATININE 3.93*  CALCIUM 8.0*  AST 21  ALT 16  ALKPHOS 101  BILITOT 0.7    ------------------------------------------------------------------------------------------------------------------  Cardiac Enzymes  Recent Labs Lab 10/07/16 1155  TROPONINI <0.03   ------------------------------------------------------------------------------------------------------------------  RADIOLOGY:  Dg Chest Portable 1 View  Result Date: 10/07/2016 CLINICAL DATA:  Initial evaluation for acute cough, congestion, sore throat, body aches. EXAM: PORTABLE CHEST 1 VIEW COMPARISON:  Previous radiograph from 03/09/2009. FINDINGS: Allowing for AP technique, cardiac and mediastinal silhouettes felt to be stable in size and contour, and within normal limits. Lungs normally inflated. No focal infiltrates. No pulmonary edema or pleural effusion. No pneumothorax. No acute osseus abnormality. IMPRESSION: No radiographic evidence for active cardiopulmonary disease. Electronically Signed   By: Jeannine Boga M.D.   On: 10/07/2016 13:06    EKG:   NSR no elevated T waves  IMPRESSION AND PLAN:   76 year old female with history of chronic kidney disease stage III and hypertension who presents with weakness and found to have acute kidney injury on chronic kidney failure and hyperkalemia.  1. Acute on chronic kidney failure: This is most likely due to decreased appetite and lisinopril. Continue IV fluids Stop lisinopril and avoid nephrotoxic agents If creatinine does not improve then order renal ultrasound and [facility] nephrology consult in a.m.  2 hyperkalemia: This is due to problem #1 and lisinopril  This has been treated  in the emergency room Repeat potassium later this afternoon 3. Hypotension: Start IV fluids and hold hypertensive medications   4. History of hypertension: Hold hypertensive medications  5. Vasovagal syncope due to dehydration and hypotension   All the records are reviewed and case discussed with ED provider. Management plans discussed with the patient and she is in  agreement  CODE STATUS: FULL  TOTAL TIME TAKING CARE OF THIS PATIENT: 45 minutes.    Ellan Tess M.D on 10/07/2016 at 2:17 PM  Between 7am to 6pm - Pager - [phone]  After 6pm go to [email or web address] - password [email or web address] [facility]  Office  [phone]  CC: Primary care physician; Freddy Finner, NP

## 2016-10-07 NOTE — Progress Notes (Signed)
Patient with NSVT asympto K still 6 repete insulin, dextrose kayexalate and K tx tele ECHO  And cards consult

## 2016-10-07 NOTE — ED Provider Notes (Signed)
[facility] Emergency Department Provider Note  ____________________________________________  Time seen: Approximately 1:58 PM  I have reviewed the triage vital signs and the nursing notes.   HISTORY  Chief Complaint Loss of Consciousness    HPI Leah Davies is a 76 y.o. female brought to the ED due to syncope today. Blood pressure 80/50 on arrival. Patient reports that she was seen by her doctor about a week ago due to cough congestion sore throat and body aches. Diagnosed with bronchitis and viral illness and given Zanaflex Tussionex and Z-Pak. Since then, she is been very low energy and felt sedated with the Zanaflex and Tussionex and has been unable to get up out of bed over the last couple of days. She has continued taking her antihypertensives even though she has not been able to eat or drink much last couple of days due to her overall sedation level.  This morning she got up to go to the bathroom, and shortly after standing and starting to walk she got lightheaded and passed out.  Denies chest pain or shortness of breath. Denies fever or chills. No vision changes numbness tingling or weakness. No neck pain.      Labs 09/03/16 in clinic: Component Name   Sodium 144  Potassium 5.7  Chloride 112  CO2 20  BUN 35   Creatinine 2.3  BUN/Creatinine Ratio 15  GFR MDRD Non Af Amer 21  GFR MDRD Af Amer 25  Anion Gap 12  Glucose 87  Calcium 8.6  Phosphorus 3.6   CBC Component Name Wbc 7 Hb 10.8 hct 33 plt 230    Past Medical History:  Diagnosis Date  . Chronic kidney disease    renal insufficiency  . Complication of anesthesia    Vital signs low during colonoscopy  . Elevated lipids   . GERD (gastroesophageal reflux disease)   . Hypertension      Patient Active Problem List   Diagnosis Date Noted  . Acute kidney insufficiency 10/07/2016     Past Surgical History:  Procedure Laterality Date  . BREAST EXCISIONAL BIOPSY Left 2010     benign  . BREAST SURGERY     Lt breast biopsy  . KNEE ARTHROSCOPY Left 10/05/2015   Procedure: ARTHROSCOPY KNEE partial medial and lateral meniscectomy, lateral release for sublux patella;  Surgeon: Hessie Knows, MD;  Location: [facility] ORS;  Service: Orthopedics;  Laterality: Left;     Prior to Admission medications   Medication Sig Start Date End Date Taking? Authorizing Provider  azithromycin (ZITHROMAX) 250 MG tablet Take 250-500 mg by mouth daily. Take 2 tablets (500 mg) by mouth on day 1, then take 1 tablet (250 mg) on days 2-5. 10/05/16 10/10/16 Yes Historical Provider, MD  chlorpheniramine-HYDROcodone (TUSSIONEX) 10-8 MG/5ML SUER Take 5 mLs by mouth every 12 (twelve) hours as needed for cough. 10/05/16 10/15/16 Yes Historical Provider, MD  lisinopril (PRINIVIL,ZESTRIL) 10 MG tablet Take 10 mg by mouth daily.   Yes Historical Provider, MD  meclizine (ANTIVERT) 25 MG tablet Take 25 mg by mouth 3 (three) times daily as needed for dizziness. Reported on 10/05/2015   Yes Historical Provider, MD  omeprazole (PRILOSEC) 40 MG capsule Take 40 mg by mouth daily.   Yes Historical Provider, MD  tiZANidine (ZANAFLEX) 4 MG tablet Take 4 mg by mouth at bedtime. 10/05/16  Yes Historical Provider, MD  HYDROcodone-acetaminophen (NORCO) 5-325 MG tablet Take 1-2 tablets by mouth every 6 (six) hours as needed for moderate pain. Patient not  taking: Reported on 10/07/2016 10/05/15   Hessie Knows, MD     Allergies Patient has no known allergies.   No family history on file.  Social History Social History  Substance Use Topics  . Smoking status: Never Smoker  . Smokeless tobacco: Never Used  . Alcohol use Yes     Comment: rare    Review of Systems  Constitutional:   No fever or chills.  ENT:   No sore throat. No rhinorrhea. Cardiovascular:   No chest pain. Respiratory:   No dyspnea Positive nonproductive cough. Gastrointestinal:   Negative for abdominal pain, positive vomiting.  Genitourinary:   Negative  for dysuria or difficulty urinating. Decreased urine output Musculoskeletal:   Negative for focal pain or swelling positive myalgia Neurological:   Negative for headaches 10-point ROS otherwise negative.  ____________________________________________   PHYSICAL EXAM:  VITAL SIGNS: ED Triage Vitals  Enc Vitals Group     BP 10/07/16 1148 (!) 95/52     Pulse Rate 10/07/16 1148 69     Resp 10/07/16 1148 14     Temp 10/07/16 1148 97.9 F (36.6 C)     Temp Source 10/07/16 1148 Oral     SpO2 10/07/16 1148 94 %     Weight 10/07/16 1149 150 lb (68 kg)     Height 10/07/16 1149 5\' 2"  (1.575 m)     Head Circumference --      Peak Flow --      Pain Score 10/07/16 1149 2     Pain Loc --      Pain Edu? --      Excl. in [place]? --     Vital signs reviewed, nursing assessments reviewed.   Constitutional:   Alert and oriented.Ill-appearing. Eyes:   No scleral icterus. No conjunctival pallor. PERRL. EOMI.  No nystagmus. ENT   Head:   Normocephalic and atraumatic.   Nose:   No congestion/rhinnorhea. No septal hematoma   Mouth/Throat:   Dry mucous membranes, no pharyngeal erythema. No peritonsillar mass.    Neck:   No stridor. No SubQ emphysema. No meningismus. Hematological/Lymphatic/Immunilogical:   No cervical lymphadenopathy. Cardiovascular:   RRR. Symmetric bilateral radial and DP pulses.  No murmurs.  Respiratory:   Normal respiratory effort without tachypnea nor retractions. Breath sounds are clear and equal bilaterally. No wheezes/rales/rhonchi. Gastrointestinal:   Soft and nontender. Non distended. There is no CVA tenderness.  No rebound, rigidity, or guarding. Genitourinary:   deferred Musculoskeletal:   Nontender with normal range of motion in all extremities. No joint effusions.  No lower extremity tenderness.  No edema. Neurologic:   Normal speech and language.  CN 2-10 normal. Motor grossly intact. No gross focal neurologic deficits are appreciated.  Skin:    Skin  is warm, dry and intact. No rash noted.  No petechiae, purpura, or bullae.  ____________________________________________    LABS (pertinent positives/negatives) (all labs ordered are listed, but only abnormal results are displayed) Labs Reviewed  CBC WITH DIFFERENTIAL/PLATELET - Abnormal; Notable for the following:       Result Value   Hemoglobin 10.9 (*)    HCT 33.3 (*)    All other components within normal limits  COMPREHENSIVE METABOLIC PANEL - Abnormal; Notable for the following:    Potassium 6.0 (*)    CO2 20 (*)    Glucose, Bld 124 (*)    BUN 61 (*)    Creatinine, Ser 3.93 (*)    Calcium 8.0 (*)    GFR calc non  Af Amer 10 (*)    GFR calc Af Amer 12 (*)    All other components within normal limits  GLUCOSE, CAPILLARY - Abnormal; Notable for the following:    Glucose-Capillary 204 (*)    All other components within normal limits  URINE CULTURE  TROPONIN I  URINALYSIS, COMPLETE (UACMP) WITH MICROSCOPIC   ____________________________________________   EKG  Interpreted by me  Date: 10/07/2016  Rate: 65  Rhythm: normal sinus rhythm  QRS Axis: normal  Intervals: normal  ST/T Wave abnormalities: normal  Conduction Disutrbances: none  Narrative Interpretation: unremarkable      ____________________________________________    RADIOLOGY  Chest x-ray unremarkable  ____________________________________________   PROCEDURES Procedures CRITICAL CARE Performed by: Joni Fears, Kelcy Baeten   Total critical care time: 35 minutes  Critical care time was exclusive of separately billable procedures and treating other patients.  Critical care was necessary to treat or prevent imminent or life-threatening deterioration.  Critical care was time spent personally by me on the following activities: development of treatment plan with patient and/or surrogate as well as nursing, discussions with consultants, evaluation of patient's response to treatment, examination of  patient, obtaining history from patient or surrogate, ordering and performing treatments and interventions, ordering and review of laboratory studies, ordering and review of radiographic studies, pulse oximetry and re-evaluation of patient's condition.  ____________________________________________   INITIAL IMPRESSION / ASSESSMENT AND PLAN / ED COURSE  Pertinent labs & imaging results that were available during my care of the patient were reviewed by me and considered in my medical decision making (see chart for details).  Patient presents with severe fatigue clinical dehydration in the setting of poor oral intake and likely viral illness. With syncope and hypotension, patient was given aggressive IV fluids while checking labs. She is not diabetic so I gave her D5 as well for calorie replacement since she has been having poor oral intake.  Labs reveal a creatinine of 3.9 from a baseline of about 2. Potassium is 6.0. Given the acute renal failure with elevated potassium, she's not having any dysrhythmia or EKG changes, but we will need to treat this with continued aggressive IV hydration as well as calcium bicarbonate and insulin and D50 to temporize until return of kidney function can clear out excess potassium. She does not appear to warrant emergent hemodialysis at this point.  Case discussed with the hospitalist at 2:00 PM for admission.   Clinical Course    ____________________________________________   FINAL CLINICAL IMPRESSION(S) / ED DIAGNOSES  Final diagnoses:  Acute renal failure, unspecified acute renal failure type (HCC)  Hyperkalemia  Dehydration  Syncope and collapse      New Prescriptions   No medications on file     Portions of this note were generated with dragon dictation software. Dictation errors may occur despite best attempts at proofreading.    Carrie Mew, MD 10/07/16 1440

## 2016-10-07 NOTE — ED Notes (Signed)
Pt arrived via EMS.  Reports cough, congestion, sore throat a\nd bodyaches.  Saw MD and put on xanaflex, tussinex and zpack.  .  Reports poor intake x 1 wk.  Took meds on an empty stomach this am and says "they are too strong". Pt had syncopal episode.  bp 80/50 on arrival. IV fluids going.

## 2016-10-07 NOTE — ED Notes (Signed)
Pt will now go to telemetry unit.

## 2016-10-07 NOTE — ED Notes (Signed)
Diet tray ordered for pt 

## 2016-10-07 NOTE — ED Notes (Signed)
Secretary paged dr mody to notify of runs of vtach.

## 2016-10-07 NOTE — ED Notes (Signed)
Dr. Stafford at bedside.  

## 2016-10-07 NOTE — ED Notes (Signed)
Pt up to restroom at this time to have BM.

## 2016-10-07 NOTE — ED Notes (Signed)
Spoke with dr mody. Will redraw K and keep pt in ED at this time per DR MODY

## 2016-10-07 NOTE — ED Notes (Signed)
Dr mody paged. Pt having runs of vtach

## 2016-10-07 NOTE — ED Notes (Signed)
Pt aware of need for urine. Has not urinated yet.

## 2016-10-08 ENCOUNTER — Inpatient Hospital Stay
Admit: 2016-10-08 | Discharge: 2016-10-08 | Disposition: A | Discharge status: Home / Self Care | Payer: Medicare Other | Attending: Internal Medicine | Admitting: Internal Medicine

## 2016-10-08 LAB — BASIC METABOLIC PANEL
Anion gap: 8 (ref 5–15)
BUN: 46 mg/dL — AB (ref 6–20)
CO2: 23 mmol/L (ref 22–32)
CREATININE: 2.91 mg/dL — AB (ref 0.44–1.00)
Calcium: 8.2 mg/dL — ABNORMAL LOW (ref 8.9–10.3)
Chloride: 110 mmol/L (ref 101–111)
GFR calc Af Amer: 17 mL/min — ABNORMAL LOW (ref >60)
GFR, EST NON AFRICAN AMERICAN: 15 mL/min — AB (ref >60)
GLUCOSE: 95 mg/dL (ref 65–99)
Potassium: 4.5 mmol/L (ref 3.5–5.1)
SODIUM: 141 mmol/L (ref 135–145)

## 2016-10-08 LAB — ECHOCARDIOGRAM COMPLETE
HEIGHTINCHES: 58 in
WEIGHTICAEL: 2411.2 [oz_av]

## 2016-10-08 LAB — URINE CULTURE

## 2016-10-08 MED ORDER — IPRATROPIUM-ALBUTEROL 0.5-2.5 (3) MG/3ML IN SOLN
3.0000 mL | Freq: Four times a day (QID) | RESPIRATORY_TRACT | Status: DC | PRN
  Administered 2016-10-08 (×2): 3 mL via RESPIRATORY_TRACT
  Filled 2016-10-08 (×2): qty 3

## 2016-10-08 MED ORDER — ENSURE ENLIVE PO LIQD
237.0000 mL | Freq: Two times a day (BID) | ORAL | Status: DC
  Administered 2016-10-08 – 2016-10-09 (×2): 237 mL via ORAL

## 2016-10-08 NOTE — Progress Notes (Signed)
[facility] [place] NAME: Leah Davies    MR#:  [id number]  DATE OF BIRTH:  03-Jul-1941  SUBJECTIVE:  CHIEF COMPLAINT:   Chief Complaint  Patient presents with  . Loss of Consciousness     Patient had some cough and shortness of breath with progressive weakness, found to have acute on chronic renal failure.   Hyperkalemia- which is resolved now. REVIEW OF SYSTEMS:  CONSTITUTIONAL: No fever, fatigue or weakness.  EYES: No blurred or double vision.  EARS, NOSE, AND THROAT: No tinnitus or ear pain.  RESPIRATORY: Positive for cough, shortness of breath, wheezing, no hemoptysis.  CARDIOVASCULAR: No chest pain, orthopnea, edema.  GASTROINTESTINAL: No nausea, vomiting, diarrhea or abdominal pain.  GENITOURINARY: No dysuria, hematuria.  ENDOCRINE: No polyuria, nocturia,  HEMATOLOGY: No anemia, easy bruising or bleeding SKIN: No rash or lesion. MUSCULOSKELETAL: No joint pain or arthritis.   NEUROLOGIC: No tingling, numbness, weakness.  PSYCHIATRY: No anxiety or depression.   ROS  DRUG ALLERGIES:  No Known Allergies  VITALS:  Blood pressure (!) 122/52, pulse 84, temperature 97.9 F (36.6 C), temperature source Oral, resp. rate 18, height 4\' 10"  (1.473 m), weight 68.4 kg (150 lb 11.2 oz), SpO2 91 %.  PHYSICAL EXAMINATION:  GENERAL:  76 y.o.-year-old patient lying in the bed with no acute distress.  EYES: Pupils equal, round, reactive to light and accommodation. No scleral icterus. Extraocular muscles intact.  HEENT: Head atraumatic, normocephalic. Oropharynx and nasopharynx clear.  NECK:  Supple, no jugular venous distention. No thyroid enlargement, no tenderness.  LUNGS: Normal breath sounds bilaterally, Some wheezing, no crepitation. No use of accessory muscles of respiration.  CARDIOVASCULAR: S1, S2 normal. No murmurs, rubs, or gallops.  ABDOMEN: Soft, nontender, nondistended. Bowel sounds present. No organomegaly or mass.  EXTREMITIES: No  pedal edema, cyanosis, or clubbing.  NEUROLOGIC: Cranial nerves II through XII are intact. Muscle strength 5/5 in all extremities. Sensation intact. Gait not checked.  PSYCHIATRIC: The patient is alert and oriented x 3.  SKIN: No obvious rash, lesion, or ulcer.   Physical Exam LABORATORY PANEL:   CBC  Recent Labs Lab 10/07/16 1155  WBC 7.6  HGB 10.9*  HCT 33.3*  PLT 184   ------------------------------------------------------------------------------------------------------------------  Chemistries   Recent Labs Lab 10/07/16 1155 10/07/16 1542  10/08/16 0443  NA 137  --   --  650  K DUPLICATE REQUEST  6.0* 5.2*  < > 4.5  CL 111  --   --  110  CO2 20*  --   --  23  GLUCOSE 124*  --   --  95  BUN 61*  --   --  46*  CREATININE 3.93*  --   --  2.91*  CALCIUM 8.0*  --   --  8.2*  MG DUPLICATE REQUEST 1.9  --   --   AST 21  --   --   --   ALT 16  --   --   --   ALKPHOS 101  --   --   --   BILITOT 0.7  --   --   --   < > = values in this interval not displayed. ------------------------------------------------------------------------------------------------------------------  Cardiac Enzymes  Recent Labs Lab 10/07/16 1155  TROPONINI <0.03   ------------------------------------------------------------------------------------------------------------------  RADIOLOGY:  Dg Abd 1 View  Result Date: 10/07/2016 CLINICAL DATA:  Acute onset of left lower quadrant abdominal pain and abdominal distention. Initial encounter. EXAM: ABDOMEN - 1 VIEW COMPARISON:  None. FINDINGS: The visualized bowel gas pattern is unremarkable. Scattered air and stool filled loops of colon are seen; no abnormal dilatation of small bowel loops is seen to suggest small bowel obstruction. No free intra-abdominal air is identified, though evaluation for free air is limited on a single supine view. The visualized osseous structures are within normal limits; the sacroiliac joints are unremarkable in  appearance. IMPRESSION: Unremarkable bowel gas pattern; no free intra-abdominal air seen. Small amount of stool noted in the colon. Electronically Signed   By: Garald Balding M.D.   On: 10/07/2016 23:07   Dg Chest Portable 1 View  Result Date: 10/07/2016 CLINICAL DATA:  Initial evaluation for acute cough, congestion, sore throat, body aches. EXAM: PORTABLE CHEST 1 VIEW COMPARISON:  Previous radiograph from 03/09/2009. FINDINGS: Allowing for AP technique, cardiac and mediastinal silhouettes felt to be stable in size and contour, and within normal limits. Lungs normally inflated. No focal infiltrates. No pulmonary edema or pleural effusion. No pneumothorax. No acute osseus abnormality. IMPRESSION: No radiographic evidence for active cardiopulmonary disease. Electronically Signed   By: Jeannine Boga M.D.   On: 10/07/2016 13:06    ASSESSMENT AND PLAN:   Active Problems:   Acute kidney insufficiency   1. Acute on chronic kidney failure: This is most likely due to decreased appetite and lisinopril. Continue IV fluids Stop lisinopril and avoid nephrotoxic agents Improved with IV fluid, continue monitoring.  2 hyperkalemia: This is due to problem #1 and lisinopril  This has been treated in the emergency room Repeat potassium came normal. 3. Hypotension: Start IV fluids and hold hypertensive medications    Stable now.  4. History of hypertension: Hold hypertensive medications  5. Vasovagal syncope due to dehydration and hypotension 6. Bronchitis- azithromycin and nebulized bronchodilator.    All the records are reviewed and case discussed with Care Management/Social Workerr. Management plans discussed with the patient, family and they are in agreement.  CODE STATUS: Full  TOTAL TIME TAKING CARE OF THIS PATIENT: 35 minutes.     POSSIBLE D/C IN 1-2 DAYS, DEPENDING ON CLINICAL CONDITION.   Vaughan Basta M.D on 10/08/2016   Between 7am to 6pm - Pager -  [phone]  After 6pm go to [email or web address] - password [email or web address] [facility]  Office  [phone]  CC: Primary care physician; Freddy Finner, NP  Note: This dictation was prepared with Dragon dictation along with smaller phrase technology. Any transcriptional errors that result from this process are unintentional.

## 2016-10-08 NOTE — Progress Notes (Signed)
*  PRELIMINARY RESULTS* Echocardiogram 2D Echocardiogram has been performed.  Leah Davies 10/08/2016, 9:51 AM

## 2016-10-08 NOTE — Evaluation (Signed)
Physical Therapy Evaluation Patient Details Name: Leah Davies MRN: [id number] DOB: 1940-12-06 Today's Date: 10/08/2016   History of Present Illness  Pt is a 76 y.o. female presenting to hospital s/p syncopal episode and weakness; pt admitted with hyperkalemia and acute kidney injury on chronic kidney failure.  PMH includes CKD stage III, htn.  Clinical Impression  Prior to hospital admission, pt was independent with ambulation.  Pt lives with 2 grandchildren and sleeps in bedroom on 2nd floor of home (6 steps to enter home and 12-13 steps to 2nd floor).  Currently pt is modified independent with bed mobility, SBA with transfers, and CGA with ambulation 160 feet (pt initially using B UE support on IV pole but then transitioned to no UE support and appeared steady without any loss of balance).  Pt's HR noted to be 86-96 bpm during ambulation.  Recommend pt discharge to home with support of family when medically appropriate.  Anticipate 1 more PT session for stairs.  No further PT needs anticipated upon discharge.    Follow Up Recommendations No PT follow up    Equipment Recommendations  None recommended by PT    Recommendations for Other Services       Precautions / Restrictions Precautions Precautions: Fall Restrictions Weight Bearing Restrictions: No      Mobility  Bed Mobility Overal bed mobility: Modified Independent             General bed mobility comments: Supine to/from sit with HOB elevated without difficulty.  Transfers Overall transfer level: Needs assistance Equipment used: None Transfers: Sit to/from Stand Sit to Stand: Supervision         General transfer comment: steady without loss of balance (x3 trials)  Ambulation/Gait Ambulation/Gait assistance: Min guard Ambulation Distance (Feet): 160 Feet Assistive device: None Gait Pattern/deviations: WFL(Within Functional Limits) Gait velocity: mildly decreased   General Gait Details: steady without loss of  balance; pt initially with B UE support on IV pole first 80 feet but then used no UE support next 80 feet  Stairs            Wheelchair Mobility    Modified Rankin (Stroke Patients Only)       Balance Overall balance assessment: Needs assistance Sitting-balance support: No upper extremity supported;Feet supported Sitting balance-Leahy Scale: Normal     Standing balance support: No upper extremity supported Standing balance-Leahy Scale: Good Standing balance comment: dynamic standing reaching within BOS                             Pertinent Vitals/Pain Pain Assessment: No/denies pain    Home Living Family/patient expects to be discharged to:: Private residence Living Arrangements: Other relatives (grandson and granddaughter) Available Help at Discharge: Family Type of Home: House Home Access: Stairs to enter Entrance Stairs-Rails: Psychiatric nurse of Steps: 6 (can only reach one rail at a time) Home Layout: Two level;Bed/bath upstairs Home Equipment: None      Prior Function Level of Independence: Independent         Comments: Pt with recent syncope but otherwise no falls in past 6 months.     Hand Dominance        Extremity/Trunk Assessment   Upper Extremity Assessment Upper Extremity Assessment: Overall WFL for tasks assessed    Lower Extremity Assessment Lower Extremity Assessment: Overall WFL for tasks assessed       Communication   Communication: Interpreter utilized Regulatory affairs officer [employer]  present for entire session)  Cognition Arousal/Alertness: Awake/alert Behavior During Therapy: WFL for tasks assessed/performed Overall Cognitive Status: Within Functional Limits for tasks assessed                      General Comments General comments (skin integrity, edema, etc.): Pt's family present during session.  Nursing cleared pt for participation in physical therapy and adjusted pt's telemetry leads  during session d/t not appearing to be reading appropriately initially.  Pt agreeable to PT session.     Exercises     Assessment/Plan    PT Assessment Patient needs continued PT services  PT Problem List Decreased mobility          PT Treatment Interventions Stair training;Therapeutic activities;Therapeutic exercise;Patient/family education    PT Goals (Current goals can be found in the Care Plan section)  Acute Rehab PT Goals Patient Stated Goal: to go home PT Goal Formulation: With patient Time For Goal Achievement: 10/22/16 Potential to Achieve Goals: Good    Frequency Min 2X/week   Barriers to discharge        Co-evaluation               End of Session Equipment Utilized During Treatment: Gait belt Activity Tolerance: Patient tolerated treatment well Patient left: in bed;with call bell/phone within reach;with bed alarm set;with family/visitor present Nurse Communication: Mobility status;Precautions         Time: 3570-1779 PT Time Calculation (min) (ACUTE ONLY): 27 min   Charges:   PT Evaluation $PT Eval Low Complexity: 1 Procedure     PT G CodesLeitha Bleak 15-Oct-2016, 3:49 PM Leitha Bleak, [place]

## 2016-10-08 NOTE — Consult Note (Signed)
[facility]  CARDIOLOGY CONSULT NOTE  Patient ID: Leah Davies MRN: [id number] DOB/AGE: 1941/04/17 76 y.o.  Admit date: 10/07/2016 Referring Physician Dr. Benjie Karvonen Primary Physician  Dr. 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 She had a syncopal episode and presented to the er where she was noted to have a blood pressure of 80 systollic with acute on chronic renal insuffiency and hyperkalemia. She was given iv fluids and d50 and insulin for her serum K of 6. Her serum troponin was normal. She denied chest pain  And felt somewhat better after the aforementioned maneuvers. cxr revealed no acute cardiopulmonary problems. ekg showed nsr with no arrhythmia or ischemia. She was felt to have had nonsustained vt on telemetry. Currently nsr with no arrhythmia  Review of Systems  Constitutional: Positive for fever, malaise/fatigue and weight loss.  HENT: Negative.   Eyes: Negative.   Respiratory: Negative.   Cardiovascular: Negative.   Gastrointestinal: Positive for abdominal pain.  Genitourinary: Negative.   Musculoskeletal: Negative.   Skin: Negative.   Neurological: Positive for dizziness, loss of consciousness and weakness.  Endo/Heme/Allergies: Negative.   Psychiatric/Behavioral: Negative.     Past Medical History:  Diagnosis Date  . Chronic kidney disease    renal insufficiency  . Complication of anesthesia    Vital signs low during colonoscopy  . Elevated lipids   . GERD (gastroesophageal reflux disease)   . Hypertension     History reviewed. No pertinent family history.  Social History   Social History  . Marital status: Single    Spouse name: N/A  . Number of children: N/A  . Years of education: N/A   Occupational History  . Not  on file.   Social History Main Topics  . Smoking status: Never Smoker  . Smokeless tobacco: Never Used  . Alcohol use Yes     Comment: rare  . Drug use: No  . Sexual activity: Not on file   Other Topics Concern  . Not on file   Social History Narrative  . No narrative on file    Past Surgical History:  Procedure Laterality Date  . BREAST EXCISIONAL BIOPSY Left 2010   benign  . BREAST SURGERY     Lt breast biopsy  . KNEE ARTHROSCOPY Left 10/05/2015   Procedure: ARTHROSCOPY KNEE partial medial and lateral meniscectomy, lateral release for sublux patella;  Surgeon: Hessie Knows, MD;  Location: [facility] ORS;  Service: Orthopedics;  Laterality: Left;     Prescriptions Prior to Admission  Medication Sig Dispense Refill Last Dose  . azithromycin (ZITHROMAX) 250 MG tablet Take 250-500 mg by mouth daily. Take 2 tablets (500 mg) by mouth on day 1, then take 1 tablet (250 mg) on days 2-5.   10/07/2016 at 0800  . chlorpheniramine-HYDROcodone (TUSSIONEX) 10-8 MG/5ML SUER Take 5 mLs by mouth every 12 (twelve) hours as needed for cough.   10/06/2016 at 2000  . lisinopril (PRINIVIL,ZESTRIL) 10 MG tablet Take 10 mg by mouth daily.   10/07/2016 at 0800  . meclizine (ANTIVERT) 25 MG tablet Take 25 mg by mouth 3 (three) times daily as needed for dizziness. Reported on 10/05/2015   prn at prn  . omeprazole (PRILOSEC) 40 MG capsule Take 40 mg by mouth daily.   10/07/2016 at 0800  .  tiZANidine (ZANAFLEX) 4 MG tablet Take 4 mg by mouth at bedtime.   10/06/2016 at 2000  . HYDROcodone-acetaminophen (NORCO) 5-325 MG tablet Take 1-2 tablets by mouth every 6 (six) hours as needed for moderate pain. (Patient not taking: Reported on 10/07/2016) 30 tablet 0 Completed Course at Unknown time    Physical Exam: Blood pressure (!) 122/52, pulse 84, temperature 97.9 F (36.6 C), temperature source Oral, resp. rate 18, height 4\' 10"  (1.473 m), weight 150 lb 11.2 oz (68.4 kg), SpO2 91 %.   Wt Readings from Last 1 Encounters:   10/07/16 150 lb 11.2 oz (68.4 kg)     General appearance: alert and cooperative Resp: clear to auscultation bilaterally Cardio: regular rate and rhythm GI: soft, non-tender; bowel sounds normal; no masses,  no organomegaly Extremities: extremities normal, atraumatic, no cyanosis or edema Pulses: 2+ and symmetric Neurologic: Grossly normal  Labs:   Lab Results  Component Value Date   WBC 7.6 10/07/2016   HGB 10.9 (L) 10/07/2016   HCT 33.3 (L) 10/07/2016   MCV 87.3 10/07/2016   PLT 184 10/07/2016    Recent Labs Lab 10/07/16 1155  10/08/16 0443  NA 314  --  970  K DUPLICATE REQUEST  6.0*  < > 4.5  CL 111  --  110  CO2 20*  --  23  BUN 61*  --  46*  CREATININE 3.93*  --  2.91*  CALCIUM 8.0*  --  8.2*  PROT 7.0  --   --   BILITOT 0.7  --   --   ALKPHOS 101  --   --   ALT 16  --   --   AST 21  --   --   GLUCOSE 124*  --  95  < > = values in this interval not displayed. Lab Results  Component Value Date   TROPONINI <0.03 10/07/2016        ASSESSMENT AND PLAN:  Pt with no prior cardiac history admitted with several days of anorexia, weakness and fatigue with abdominal pain and poor po intake. She had a syncopal episode prompting her presentation to the er where she was noted to have acute on chronic renal insuffiency, with hyperkalemia, hypotension. She was hydrated and given d50 and insulin. She had a brief run of nsvt but currently is in nsr. Will continue to follow electrolytes and hydrate. Echo showed normal lv funciton woth lvh. No high grade valvular abnormality.  Signed: Teodoro Spray MD, [facility] 10/08/2016, 3:18 PM

## 2016-10-08 NOTE — Progress Notes (Signed)
Initial Nutrition Assessment  DOCUMENTATION CODES:   Not applicable  INTERVENTION:  1. Ensure Enlive po BID, each supplement provides 350 kcal and 20 grams of protein  NUTRITION DIAGNOSIS:   Inadequate oral intake related to poor appetite as evidenced by per patient/family report.  GOAL:   Patient will meet greater than or equal to 90% of their needs  MONITOR:   PO intake, I & O's, Labs, Weight trends, Supplement acceptance  REASON FOR ASSESSMENT:   Malnutrition Screening Tool    ASSESSMENT:   Leah Davies  is a 76 y.o. female with a known history of Chronic kidney disease stage III with a baseline creatinine of 2.2 and essential hypertension who presents with weakness  Spoke with patient through daughter translating via cellphone. She exhibits poor appetite x4 days with patient having a "cold." Patient reports 4#/2.6% severe wt loss during this time span. Denies any nausea/vomiting or chewing/swallowing problems. Had eaten ~50% of her tray during my visit, Eggs, Grits, [place] sausage. Patient was agreeable to consuming chocolate ensure during stay.  Nutrition-Focused physical exam completed. Findings are moderate fat depletion, moderate muscle depletion, and no edema.   Labs and medications reviewed: NS @ 136mL/hr  Diet Order:  Diet regular Room service appropriate? Yes; Fluid consistency: Thin  Skin:  Reviewed, no issues  Last BM:  10/07/2016  Height:   Ht Readings from Last 1 Encounters:  10/07/16 4\' 10"  (1.473 m)    Weight:   Wt Readings from Last 1 Encounters:  10/07/16 150 lb 11.2 oz (68.4 kg)    Ideal Body Weight:  40.9 kg  BMI:  Body mass index is 31.5 kg/m.  Estimated Nutritional Needs:   Kcal:  1093-2355 (MSJ x1.2-1.4)  Protein:  48-54 gm (.6-.8 gm/kg)  Fluid:  >/= 1.3L  EDUCATION NEEDS:   No education needs identified at this time  Satira Anis. Alfred Harrel, MS, RD LDN Inpatient Clinical Dietitian Pager [phone]

## 2016-10-09 LAB — BASIC METABOLIC PANEL
Anion gap: 6 (ref 5–15)
Anion gap: 5 (ref 5–15)
BUN: 43 mg/dL — AB (ref 6–20)
BUN: 39 mg/dL — AB (ref 6–20)
CALCIUM: 7.7 mg/dL — AB (ref 8.9–10.3)
CALCIUM: 7.8 mg/dL — AB (ref 8.9–10.3)
CO2: 22 mmol/L (ref 22–32)
CO2: 25 mmol/L (ref 22–32)
CREATININE: 2.6 mg/dL — AB (ref 0.44–1.00)
CREATININE: 2.54 mg/dL — AB (ref 0.44–1.00)
Chloride: 115 mmol/L — ABNORMAL HIGH (ref 101–111)
Chloride: 112 mmol/L — ABNORMAL HIGH (ref 101–111)
GFR calc Af Amer: 20 mL/min — ABNORMAL LOW (ref >60)
GFR calc Af Amer: 20 mL/min — ABNORMAL LOW (ref >60)
GFR, EST NON AFRICAN AMERICAN: 17 mL/min — AB (ref >60)
GFR, EST NON AFRICAN AMERICAN: 18 mL/min — AB (ref >60)
GLUCOSE: 113 mg/dL — AB (ref 65–99)
Glucose, Bld: 97 mg/dL (ref 65–99)
Potassium: 4.6 mmol/L (ref 3.5–5.1)
Potassium: 4.5 mmol/L (ref 3.5–5.1)
SODIUM: 143 mmol/L (ref 135–145)
SODIUM: 142 mmol/L (ref 135–145)

## 2016-10-09 MED ORDER — AMLODIPINE BESYLATE 5 MG PO TABS
5.0000 mg | ORAL_TABLET | Freq: Every day | ORAL | 0 refills | Status: DC
Start: 2016-10-10 — End: 2017-01-02

## 2016-10-09 MED ORDER — AMLODIPINE BESYLATE 5 MG PO TABS
5.0000 mg | ORAL_TABLET | Freq: Every day | ORAL | Status: DC
  Administered 2016-10-09: 5 mg via ORAL
  Filled 2016-10-09: qty 1

## 2016-10-09 MED ORDER — DM-GUAIFENESIN ER 30-600 MG PO TB12: 1.0000 | ORAL_TABLET | Freq: Two times a day (BID) | ORAL | 0 refills | Status: DC

## 2016-10-09 MED ORDER — CEFUROXIME AXETIL 250 MG PO TABS: 250.0000 mg | ORAL_TABLET | Freq: Two times a day (BID) | ORAL | 0 refills | Status: DC

## 2016-10-09 NOTE — Progress Notes (Signed)
Patient BP 180/60. MD notified. Amlodipine 5mg  ordered. Will give and continue to monitor.

## 2016-10-09 NOTE — Progress Notes (Signed)
IV and tele removed from patient. Discharge instructions along with hard copy prescriptions given to patient and patients family. Verbalized understanding. No distress at this time. Family is at bedside and will be transporting patient home.

## 2016-10-12 ENCOUNTER — Emergency Department
Admission: EM | Admit: 2016-10-12 | Discharge: 2016-10-12 | Disposition: A | Discharge status: Home / Self Care | Payer: Medicare Other | Attending: Emergency Medicine | Admitting: Emergency Medicine

## 2016-10-12 ENCOUNTER — Encounter: Payer: Self-pay | Admitting: Emergency Medicine

## 2016-10-12 DIAGNOSIS — Z79899 Other long term (current) drug therapy: Secondary | ICD-10-CM | POA: Diagnosis not present

## 2016-10-12 DIAGNOSIS — N189 Chronic kidney disease, unspecified: Secondary | ICD-10-CM | POA: Diagnosis not present

## 2016-10-12 DIAGNOSIS — R531 Weakness: Secondary | ICD-10-CM | POA: Diagnosis present

## 2016-10-12 DIAGNOSIS — K2900 Acute gastritis without bleeding: Secondary | ICD-10-CM

## 2016-10-12 DIAGNOSIS — I129 Hypertensive chronic kidney disease with stage 1 through stage 4 chronic kidney disease, or unspecified chronic kidney disease: Secondary | ICD-10-CM | POA: Diagnosis not present

## 2016-10-12 DIAGNOSIS — E860 Dehydration: Secondary | ICD-10-CM | POA: Insufficient documentation

## 2016-10-12 LAB — HEPATIC FUNCTION PANEL
ALK PHOS: 115 U/L (ref 38–126)
ALT: 30 U/L (ref 14–54)
AST: 38 U/L (ref 15–41)
Albumin: 3.8 g/dL (ref 3.5–5.0)
Bilirubin, Direct: <0.1 mg/dL — ABNORMAL LOW (ref 0.1–0.5)
TOTAL PROTEIN: 7.4 g/dL (ref 6.5–8.1)
Total Bilirubin: 0.1 mg/dL — ABNORMAL LOW (ref 0.3–1.2)

## 2016-10-12 LAB — URINALYSIS, COMPLETE (UACMP) WITH MICROSCOPIC
BILIRUBIN URINE: NEGATIVE
Bacteria, UA: NONE SEEN
Glucose, UA: NEGATIVE mg/dL
HGB URINE DIPSTICK: NEGATIVE
KETONES UR: NEGATIVE mg/dL
LEUKOCYTES UA: NEGATIVE
NITRITE: NEGATIVE
PROTEIN: 100 mg/dL — AB
Specific Gravity, Urine: 1.01 (ref 1.005–1.030)
Squamous Epithelial / LPF: NONE SEEN
pH: 7 (ref 5.0–8.0)

## 2016-10-12 LAB — CBC
HCT: 33 % — ABNORMAL LOW (ref 35.0–47.0)
HEMOGLOBIN: 11.4 g/dL — AB (ref 12.0–16.0)
MCH: 29 pg (ref 26.0–34.0)
MCHC: 34.6 g/dL (ref 32.0–36.0)
MCV: 83.9 fL (ref 80.0–100.0)
Platelets: 247 10*3/uL (ref 150–440)
RBC: 3.93 MIL/uL (ref 3.80–5.20)
RDW: 12.9 % (ref 11.5–14.5)
WBC: 7.1 10*3/uL (ref 3.6–11.0)

## 2016-10-12 LAB — BASIC METABOLIC PANEL
ANION GAP: 10 (ref 5–15)
BUN: 37 mg/dL — ABNORMAL HIGH (ref 6–20)
CO2: 22 mmol/L (ref 22–32)
Calcium: 8.7 mg/dL — ABNORMAL LOW (ref 8.9–10.3)
Chloride: 106 mmol/L (ref 101–111)
Creatinine, Ser: 2.07 mg/dL — ABNORMAL HIGH (ref 0.44–1.00)
GFR, EST AFRICAN AMERICAN: 26 mL/min — AB (ref >60)
GFR, EST NON AFRICAN AMERICAN: 22 mL/min — AB (ref >60)
Glucose, Bld: 179 mg/dL — ABNORMAL HIGH (ref 65–99)
POTASSIUM: 4.6 mmol/L (ref 3.5–5.1)
Sodium: 138 mmol/L (ref 135–145)

## 2016-10-12 LAB — LIPASE, BLOOD: LIPASE: 42 U/L (ref 11–51)

## 2016-10-12 MED ORDER — SODIUM CHLORIDE 0.9 % IV BOLUS (SEPSIS)
1000.0000 mL | Freq: Once | INTRAVENOUS | Status: AC
  Administered 2016-10-12: 1000 mL via INTRAVENOUS

## 2016-10-12 MED ORDER — FAMOTIDINE IN NACL 20-0.9 MG/50ML-% IV SOLN
20.0000 mg | Freq: Once | INTRAVENOUS | Status: AC
  Administered 2016-10-12: 20 mg via INTRAVENOUS
  Filled 2016-10-12: qty 50

## 2016-10-12 MED ORDER — ALUM & MAG HYDROXIDE-SIMETH 200-200-20 MG/5ML PO SUSP
30.0000 mL | Freq: Once | ORAL | Status: AC
  Administered 2016-10-12: 30 mL via ORAL
  Filled 2016-10-12: qty 30

## 2016-10-12 MED ORDER — FAMOTIDINE 20 MG PO TABS: 20.0000 mg | ORAL_TABLET | Freq: Two times a day (BID) | ORAL | 0 refills | Status: DC

## 2016-10-12 MED ORDER — SUCRALFATE 1 G PO TABS: 1.0000 g | ORAL_TABLET | Freq: Four times a day (QID) | ORAL | 1 refills | Status: DC

## 2016-10-12 MED ORDER — ONDANSETRON 4 MG PO TBDP: 4.0000 mg | ORAL_TABLET | Freq: Three times a day (TID) | ORAL | 0 refills | Status: DC | PRN

## 2016-10-12 MED ORDER — ONDANSETRON HCL 4 MG/2ML IJ SOLN
4.0000 mg | Freq: Once | INTRAMUSCULAR | Status: AC
  Administered 2016-10-12: 4 mg via INTRAVENOUS
  Filled 2016-10-12: qty 2

## 2016-10-12 NOTE — Discharge Summary (Signed)
[facility] [place] NAME: Leah Davies    MR#:  [id number]  DATE OF BIRTH:  02/17/1941  DATE OF ADMISSION:  10/07/2016 ADMITTING PHYSICIAN: Bettey Costa, MD  DATE OF DISCHARGE: 10/09/2016  1:45 PM  PRIMARY CARE PHYSICIAN: Freddy Finner, NP    ADMISSION DIAGNOSIS:  Syncope and collapse [R55] Dehydration [E86.0] Hyperkalemia [E87.5] Acute renal failure, unspecified acute renal failure type ([place]) [N17.9]  DISCHARGE DIAGNOSIS:  Active Problems:   Acute kidney insufficiency   SECONDARY DIAGNOSIS:   Past Medical History:  Diagnosis Date  . Chronic kidney disease    renal insufficiency  . Complication of anesthesia    Vital signs low during colonoscopy  . Elevated lipids   . GERD (gastroesophageal reflux disease)   . Hypertension     HOSPITAL COURSE:   1. Acute on chronic kidney failure: This is most likely due to decreased appetite and lisinopril. Continue IV fluids Stop lisinopril and avoid nephrotoxic agents Improved with IV fluid,   advised to follow with PMD and check renal func in 1 week.  2hyperkalemia: This is due to problem #1 and lisinopril  This has been treated in the emergency room Repeat potassium came normal. 3. Hypotension: Start IV fluids and hold hypertensive medications    Stable now.  4. History of hypertension: Hold hypertensive medications  5. Vasovagal syncope due to dehydration and hypotension 6. Bronchitis- azithromycin and nebulized bronchodilator.  DISCHARGE CONDITIONS:   Stable.  CONSULTS OBTAINED:  Treatment Team:  Teodoro Spray, MD  DRUG ALLERGIES:  No Known Allergies  DISCHARGE MEDICATIONS:   Discharge Medication List as of 10/09/2016  1:00 PM    START taking these medications   Details  amLODipine (NORVASC) 5 MG tablet Take 1 tablet (5 mg total) by mouth daily., Starting Thu 10/10/2016, Print    cefUROXime (CEFTIN) 250 MG tablet Take 1 tablet (250 mg total) by mouth 2  (two) times daily with a meal., Starting Wed 10/09/2016, Print    dextromethorphan-guaiFENesin (MUCINEX DM) 30-600 MG 12hr tablet Take 1 tablet by mouth 2 (two) times daily., Starting Wed 10/09/2016, Print      CONTINUE these medications which have NOT CHANGED   Details  azithromycin (ZITHROMAX) 250 MG tablet Take 250-500 mg by mouth daily. Take 2 tablets (500 mg) by mouth on day 1, then take 1 tablet (250 mg) on days 2-5., Starting Sat 10/05/2016, Until Thu 10/10/2016, Historical Med    chlorpheniramine-HYDROcodone (TUSSIONEX) 10-8 MG/5ML SUER Take 5 mLs by mouth every 12 (twelve) hours as needed for cough., Starting Sat 10/05/2016, Until Tue 10/15/2016, Historical Med    meclizine (ANTIVERT) 25 MG tablet Take 25 mg by mouth 3 (three) times daily as needed for dizziness. Reported on 10/05/2015, Historical Med    omeprazole (PRILOSEC) 40 MG capsule Take 40 mg by mouth daily., Historical Med    tiZANidine (ZANAFLEX) 4 MG tablet Take 4 mg by mouth at bedtime., Starting Sat 10/05/2016, Historical Med      STOP taking these medications     HYDROcodone-acetaminophen (NORCO) 5-325 MG tablet      lisinopril (PRINIVIL,ZESTRIL) 10 MG tablet          DISCHARGE INSTRUCTIONS:    Follow with PMD.  If you experience worsening of your admission symptoms, develop shortness of breath, life threatening emergency, suicidal or homicidal thoughts you must seek medical attention immediately by calling 911 or calling your MD immediately  if symptoms less severe.  You Must read  complete instructions/literature along with all the possible adverse reactions/side effects for all the Medicines you take and that have been prescribed to you. Take any new Medicines after you have completely understood and accept all the possible adverse reactions/side effects.   Please note  You were cared for by a hospitalist during your hospital stay. If you have any questions about your discharge medications or the care you received  while you were in the hospital after you are discharged, you can call the unit and asked to speak with the hospitalist on call if the hospitalist that took care of you is not available. Once you are discharged, your primary care physician will handle any further medical issues. Please note that NO REFILLS for any discharge medications will be authorized once you are discharged, as it is imperative that you return to your primary care physician (or establish a relationship with a primary care physician if you do not have one) for your aftercare needs so that they can reassess your need for medications and monitor your lab values.    Today   CHIEF COMPLAINT:   Chief Complaint  Patient presents with  . Loss of Consciousness    HISTORY OF PRESENT ILLNESS:  Leah Davies  is a 76 y.o. female with a known history of Chronic kidney disease stage III with a baseline creatinine of 2.2 and essential hypertension who presents with above complaint. Patient was [facility] about a [facility] and prescribed Z-Pak, Tessalon and Zanaflex for sore throat, body aches and congestion. Since that time she has had decreased appetite, increasing cough, weakness in this morning she had a syncopal episode. Her blood pressure on arrival was 80/50. She is also noted to have acute kidney injury and hyperkalemia.. She reports she has been taking her medications despite not feeling well over the past week. She is given D50 and insulin in the emergency room as well as IV fluids.  She follows at [facility] nephrology. Yes  VITAL SIGNS:  Blood pressure (!) 158/56, pulse 79, temperature 98.4 F (36.9 C), temperature source Oral, resp. rate 16, height 4\' 10"  (1.473 m), weight 68.4 kg (150 lb 11.2 oz), SpO2 94 %.  I/O:  No intake or output data in the 24 hours ending 10/12/16 0716  PHYSICAL EXAMINATION:   GENERAL:  76 y.o.-year-old patient lying in the bed with no acute distress.  EYES: Pupils equal, round, reactive  to light and accommodation. No scleral icterus. Extraocular muscles intact.  HEENT: Head atraumatic, normocephalic. Oropharynx and nasopharynx clear.  NECK:  Supple, no jugular venous distention. No thyroid enlargement, no tenderness.  LUNGS: Normal breath sounds bilaterally, Some wheezing, no crepitation. No use of accessory muscles of respiration.  CARDIOVASCULAR: S1, S2 normal. No murmurs, rubs, or gallops.  ABDOMEN: Soft, nontender, nondistended. Bowel sounds present. No organomegaly or mass.  EXTREMITIES: No pedal edema, cyanosis, or clubbing.  NEUROLOGIC: Cranial nerves II through XII are intact. Muscle strength 5/5 in all extremities. Sensation intact. Gait not checked.  PSYCHIATRIC: The patient is alert and oriented x 3.  SKIN: No obvious rash, lesion, or ulcer.   DATA REVIEW:   CBC  Recent Labs Lab 10/07/16 1155  WBC 7.6  HGB 10.9*  HCT 33.3*  PLT 184    Chemistries   Recent Labs Lab 10/07/16 1155 10/07/16 1542  10/09/16 1104  NA 137  --   < > 194  K DUPLICATE REQUEST  6.0* 5.2*  < > 4.5  CL 111  --   < >  112*  CO2 20*  --   < > 25  GLUCOSE 124*  --   < > 113*  BUN 61*  --   < > 39*  CREATININE 3.93*  --   < > 2.54*  CALCIUM 8.0*  --   < > 7.8*  MG DUPLICATE REQUEST 1.9  --   --   AST 21  --   --   --   ALT 16  --   --   --   ALKPHOS 101  --   --   --   BILITOT 0.7  --   --   --   < > = values in this interval not displayed.  Cardiac Enzymes  Recent Labs Lab 10/07/16 1155  TROPONINI <0.03    Microbiology Results  Results for orders placed or performed during the hospital encounter of 10/07/16  Urine culture     Status: Abnormal   Collection Time: 10/07/16  2:45 PM  Result Value Ref Range Status   Specimen Description URINE, RANDOM  Final   Special Requests NONE  Final   Culture MULTIPLE SPECIES PRESENT, SUGGEST RECOLLECTION (A)  Final   Report Status 10/08/2016 FINAL  Final    RADIOLOGY:  No results found.  EKG:   Orders placed or  performed during the hospital encounter of 10/07/16  . ED EKG  . ED EKG  . EKG 12-Lead  . EKG 12-Lead  . EKG      Management plans discussed with the patient, family and they are in agreement.  CODE STATUS:  Code Status History    Date Active Date Inactive Code Status Order ID Comments User Context   10/07/2016 10:05 PM 10/09/2016  5:22 PM Full Code [id number]  Bettey Costa, MD Inpatient   10/05/2015  2:25 PM 10/05/2015  7:39 PM Full Code [id number]  Hessie Knows, MD Inpatient      TOTAL TIME TAKING CARE OF THIS PATIENT: 35 minutes.    Vaughan Basta M.D on 10/12/2016 at 7:16 AM  Between 7am to 6pm - Pager - [phone]  After 6pm go to [email or web address] - password [email or web address]  Office  [phone]  CC: Primary care physician; Freddy Finner, NP   Note: This dictation was prepared with Dragon dictation along with smaller phrase technology. Any transcriptional errors that result from this process are unintentional.

## 2016-10-12 NOTE — ED Triage Notes (Signed)
Patient presents to the ED with weakness, dizziness, "not feeling right" since being released from the hospital on Wednesday.  Patient was admitted for a liver/potassium problem.  Patient states she is not wanting to eat.  Family and patient denies any confusion.

## 2016-10-12 NOTE — Discharge Instructions (Signed)
Your blood and urine tests today were unremarkable. Your kidney function is at your normal baseline. Continue taking Zofran for nausea and famotidine for stomach irritation and follow up with your doctor.

## 2016-10-12 NOTE — ED Notes (Signed)
Patient states that she was discharged from our facility on Wednesday for a syncopal episode. Patient states that Thursday into Friday she started to feel weak, patient has had decreased appetite, and nausea.  Patient is also concerned that her blood pressure is high, patients blood pressure medication was recently changed.

## 2016-10-12 NOTE — ED Notes (Signed)
Interpreter requested for patient

## 2016-10-12 NOTE — ED Provider Notes (Signed)
[facility] Department Provider Note  ____________________________________________  Time seen: Approximately 12:34 PM  I have reviewed the triage vital signs and the nursing notes.   HISTORY  Chief Complaint Extremity Weakness; Fatigue; and Dizziness Encounter completed with Spanish interpreter at bedside   HPI Leah Davies is a 76 y.o. female brought to the ED due to generalized weakness today as well as a burning discomfort in her upper abdomen. Patient reports that she was recently hospitalized due to dehydration and poor kidney function and high potassium. In fact I saw her in the emergency department when she presented with this illness. It was primarily related to over sedation from medication she was given to relieve the symptoms of an upper respiratory illness. During the hospitalization, her kidney function and potassium returned to normal with IV fluid hydration. She felt much better. However, she reports that since going home she wants to eat but does not have an appetite. She reports that she feels some discomfort in her stomach when she whenever she eats. She does drink a lot of fruit juice. This morning she drank a glass of orange juice which made her stomach hurt worse. Abdominal pain is nonradiating. Mild to moderate in intensity.  Fever chills chest pain shortness of breath vomiting diarrhea or syncope. No lateralizing neurologic symptoms. No headache  Currently taking Mucinex DM, amlodipine 5, and cefuroxime.     Past Medical History:  Diagnosis Date  . Chronic kidney disease    renal insufficiency  . Complication of anesthesia    Vital signs low during colonoscopy  . Elevated lipids   . GERD (gastroesophageal reflux disease)   . Hypertension      Patient Active Problem List   Diagnosis Date Noted  . Acute kidney insufficiency 10/07/2016     Past Surgical History:  Procedure Laterality Date  . BREAST EXCISIONAL BIOPSY  Left 2010   benign  . BREAST SURGERY     Lt breast biopsy  . KNEE ARTHROSCOPY Left 10/05/2015   Procedure: ARTHROSCOPY KNEE partial medial and lateral meniscectomy, lateral release for sublux patella;  Surgeon: Hessie Knows, MD;  Location: [facility] ORS;  Service: Orthopedics;  Laterality: Left;     Prior to Admission medications   Medication Sig Start Date End Date Taking? Authorizing Provider  amLODipine (NORVASC) 5 MG tablet Take 1 tablet (5 mg total) by mouth daily. 10/10/16  Yes Vaughan Basta, MD  cefUROXime (CEFTIN) 250 MG tablet Take 1 tablet (250 mg total) by mouth 2 (two) times daily with a meal. 10/09/16  Yes Vaughan Basta, MD  chlorpheniramine-HYDROcodone (TUSSIONEX) 10-8 MG/5ML SUER Take 5 mLs by mouth every 12 (twelve) hours as needed for cough. 10/05/16 10/15/16 Yes Historical Provider, MD  dextromethorphan-guaiFENesin (MUCINEX DM) 30-600 MG 12hr tablet Take 1 tablet by mouth 2 (two) times daily. 10/09/16  Yes Vaughan Basta, MD  meclizine (ANTIVERT) 25 MG tablet Take 25 mg by mouth 3 (three) times daily as needed for dizziness. Reported on 10/05/2015   Yes Historical Provider, MD  omeprazole (PRILOSEC) 40 MG capsule Take 40 mg by mouth daily.   Yes Historical Provider, MD  tiZANidine (ZANAFLEX) 4 MG tablet Take 4 mg by mouth at bedtime. 10/05/16  Yes Historical Provider, MD  famotidine (PEPCID) 20 MG tablet Take 1 tablet (20 mg total) by mouth 2 (two) times daily. 10/12/16   Carrie Mew, MD  ondansetron (ZOFRAN ODT) 4 MG disintegrating tablet Take 1 tablet (4 mg total) by mouth every 8 (eight) hours  as needed for nausea or vomiting. 10/12/16   Carrie Mew, MD  sucralfate (CARAFATE) 1 g tablet Take 1 tablet (1 g total) by mouth 4 (four) times daily. 10/12/16   Carrie Mew, MD     Allergies Patient has no known allergies.   No family history on file.  Social History Social History  Substance Use Topics  . Smoking status: Never Smoker  . Smokeless  tobacco: Never Used  . Alcohol use Yes     Comment: rare    Review of Systems  Constitutional:   No fever or chills.  ENT:   No sore throat. No rhinorrhea.Positive sinus pressure. Cardiovascular:   No chest pain. Respiratory:   No dyspnea or cough. Gastrointestinal:   Abdominal pain as above without vomiting and diarrhea.  Genitourinary:   Negative for dysuria or difficulty urinating. Musculoskeletal:   Negative for focal pain or swelling Neurological:   Negative for headaches 10-point ROS otherwise negative.  ____________________________________________   PHYSICAL EXAM:  VITAL SIGNS: ED Triage Vitals  Enc Vitals Group     BP 10/12/16 1026 (!) 160/71     Pulse Rate 10/12/16 1026 89     Resp 10/12/16 1026 17     Temp 10/12/16 1026 98 F (36.7 C)     Temp Source 10/12/16 1026 Oral     SpO2 10/12/16 1026 97 %     Weight 10/12/16 1027 150 lb (68 kg)     Height 10/12/16 1027 4\' 10"  (1.473 m)     Head Circumference --      Peak Flow --      Pain Score 10/12/16 1026 0     Pain Loc --      Pain Edu? --      Excl. in [place]? --     Vital signs reviewed, nursing assessments reviewed.   Constitutional:   Alert and oriented. Well appearing and in no distress. Eyes:   No scleral icterus. No conjunctival pallor. PERRL. EOMI.  No nystagmus. ENT   Head:   Normocephalic and atraumatic.   Nose:   No congestion/rhinnorhea. No septal hematoma   Mouth/Throat:   MMM, no pharyngeal erythema. No peritonsillar mass.    Neck:   No stridor. No SubQ emphysema. No meningismus. Hematological/Lymphatic/Immunilogical:   No cervical lymphadenopathy. Cardiovascular:   RRR. Symmetric bilateral radial and DP pulses.  No murmurs.  Respiratory:   Normal respiratory effort without tachypnea nor retractions. Breath sounds are clear and equal bilaterally. No wheezes/rales/rhonchi. Gastrointestinal:   Soft with mild left upper quadrant tenderness. Non distended. There is no CVA tenderness.  No  rebound, rigidity, or guarding. Genitourinary:   deferred Musculoskeletal:   Nontender with normal range of motion in all extremities. No joint effusions.  No lower extremity tenderness.  No edema. Neurologic:   Normal speech and language.  CN 2-10 normal. Motor grossly intact. No gross focal neurologic deficits are appreciated.  Skin:    Skin is warm, dry and intact. No rash noted.  No petechiae, purpura, or bullae.  ____________________________________________    LABS (pertinent positives/negatives) (all labs ordered are listed, but only abnormal results are displayed) Labs Reviewed  BASIC METABOLIC PANEL - Abnormal; Notable for the following:       Result Value   Glucose, Bld 179 (*)    BUN 37 (*)    Creatinine, Ser 2.07 (*)    Calcium 8.7 (*)    GFR calc non Af Amer 22 (*)    GFR calc Af Wyvonnia Lora  26 (*)    All other components within normal limits  CBC - Abnormal; Notable for the following:    Hemoglobin 11.4 (*)    HCT 33.0 (*)    All other components within normal limits  URINALYSIS, COMPLETE (UACMP) WITH MICROSCOPIC - Abnormal; Notable for the following:    Color, Urine STRAW (*)    APPearance CLEAR (*)    Protein, ur 100 (*)    All other components within normal limits  HEPATIC FUNCTION PANEL - Abnormal; Notable for the following:    Total Bilirubin 0.1 (*)    Bilirubin, Direct <0.1 (*)    All other components within normal limits  LIPASE, BLOOD   ____________________________________________   EKG  Interpreted by me Normal sinus rhythm rate of 84, left axis, normal intervals. Normal QRS ST segments and T waves.  ____________________________________________    RADIOLOGY    ____________________________________________   PROCEDURES Procedures  ____________________________________________   INITIAL IMPRESSION / ASSESSMENT AND PLAN / ED COURSE  Pertinent labs & imaging results that were available during my care of the patient were reviewed by me and  considered in my medical decision making (see chart for details).  Patient well appearing no acute distress, presents with symptoms of gastritis and GERD, likely related to her convalescing viral illness, possibly side effect of the antibiotic she is taking as well. She is very well-appearing.Considering the patient's symptoms, medical history, and physical examination today, I have low suspicion for cholecystitis or biliary pathology, pancreatitis, perforation or bowel obstruction, hernia, intra-abdominal abscess, AAA or dissection, volvulus or intussusception, mesenteric ischemia, or appendicitis.  Labs today are reassuring. Entirely at baseline. Fluids, antacids and feeling better. I'll discharge home with continued symptomatically for Zofran sucralfate and Pepcid. Follow up with primary care. Patient and family counseled extensively on her current symptoms and how to manage them. Return precautions given.     Clinical Course    ____________________________________________   FINAL CLINICAL IMPRESSION(S) / ED DIAGNOSES  Final diagnoses:  Dehydration  Acute gastritis without hemorrhage, unspecified gastritis type      New Prescriptions   FAMOTIDINE (PEPCID) 20 MG TABLET    Take 1 tablet (20 mg total) by mouth 2 (two) times daily.   ONDANSETRON (ZOFRAN ODT) 4 MG DISINTEGRATING TABLET    Take 1 tablet (4 mg total) by mouth every 8 (eight) hours as needed for nausea or vomiting.   SUCRALFATE (CARAFATE) 1 G TABLET    Take 1 tablet (1 g total) by mouth 4 (four) times daily.     Portions of this note were generated with dragon dictation software. Dictation errors may occur despite best attempts at proofreading.    Carrie Mew, MD 10/12/16 [phone]

## 2016-11-18 ENCOUNTER — Encounter (INDEPENDENT_AMBULATORY_CARE_PROVIDER_SITE_OTHER): Payer: Self-pay | Admitting: Vascular Surgery

## 2016-11-18 ENCOUNTER — Ambulatory Visit (INDEPENDENT_AMBULATORY_CARE_PROVIDER_SITE_OTHER): Payer: Medicare Other | Admitting: Vascular Surgery

## 2016-11-18 DIAGNOSIS — I8311 Varicose veins of right lower extremity with inflammation: Secondary | ICD-10-CM | POA: Diagnosis not present

## 2016-11-18 DIAGNOSIS — M79609 Pain in unspecified limb: Secondary | ICD-10-CM | POA: Insufficient documentation

## 2016-11-18 DIAGNOSIS — I8312 Varicose veins of left lower extremity with inflammation: Secondary | ICD-10-CM

## 2016-11-18 DIAGNOSIS — M79604 Pain in right leg: Secondary | ICD-10-CM | POA: Diagnosis not present

## 2016-11-18 DIAGNOSIS — M79605 Pain in left leg: Secondary | ICD-10-CM

## 2016-11-18 DIAGNOSIS — I872 Venous insufficiency (chronic) (peripheral): Secondary | ICD-10-CM | POA: Diagnosis not present

## 2016-11-18 DIAGNOSIS — M7989 Other specified soft tissue disorders: Secondary | ICD-10-CM | POA: Diagnosis not present

## 2016-11-18 NOTE — Progress Notes (Signed)
MRN : [id number]  Leah Davies is a 76 y.o. (21-Nov-1940) female who presents with chief complaint of  Chief Complaint  Patient presents with  . New Evaluation    Varicose veins  .  History of Present Illness:The patient is seen for evaluation of symptomatic varicose veins. The patient relates burning and stinging which worsened steadily throughout the course of the day, particularly with standing. The patient also notes an aching and throbbing pain over the varicosities, particularly with prolonged dependent positions. The symptoms are significantly improved with elevation.  The patient also notes that during hot weather the symptoms are greatly intensified. The patient states the pain from the varicose veins interferes with work, daily exercise, shopping and household maintenance. At this point, the symptoms are persistent and severe enough that they're having a negative impact on lifestyle and are interfering with daily activities.  There is no history of DVT, PE or superficial thrombophlebitis. There is no history of ulceration or hemorrhage. The patient denies a significant family history of varicose veins.  The patient has not worn graduated compression in the past. At the present time the patient has not been using over-the-counter analgesics. There is no history of prior surgical intervention or sclerotherapy.   Current Meds  Medication Sig  . acetaminophen (TYLENOL) 500 MG tablet Take 500 mg by mouth as needed.  Marland Kitchen amLODipine (NORVASC) 5 MG tablet Take 1 tablet (5 mg total) by mouth daily.    Past Medical History:  Diagnosis Date  . Chronic kidney disease    renal insufficiency  . Complication of anesthesia    Vital signs low during colonoscopy  . Elevated lipids   . GERD (gastroesophageal reflux disease)   . Hypertension     Past Surgical History:  Procedure Laterality Date  . BREAST EXCISIONAL BIOPSY Left 2010   benign  . BREAST SURGERY     Lt breast biopsy  .  KNEE ARTHROSCOPY Left 10/05/2015   Procedure: ARTHROSCOPY KNEE partial medial and lateral meniscectomy, lateral release for sublux patella;  Surgeon: Hessie Knows, MD;  Location: [facility] ORS;  Service: Orthopedics;  Laterality: Left;    Social History Social History  Substance Use Topics  . Smoking status: Never Smoker  . Smokeless tobacco: Never Used  . Alcohol use Yes     Comment: rare    Family History No family history on file. No family history of bleeding/clotting disorders, porphyria or autoimmune disease   No Known Allergies   REVIEW OF SYSTEMS (Negative unless checked)  Constitutional: [] Weight loss  [] Fever  [] Chills Cardiac: [] Chest pain   [] Chest pressure   [] Palpitations   [] Shortness of breath when laying flat   [] Shortness of breath with exertion. Vascular:  [] Pain in legs with walking   [] Pain in legs at rest  [] History of DVT   [] Phlebitis   [x] Swelling in legs   [x] Varicose veins   [] Non-healing ulcers Pulmonary:   [] Uses home oxygen   [] Productive cough   [] Hemoptysis   [] Wheeze  [] COPD   [] Asthma Neurologic:  [] Dizziness   [] Seizures   [] History of stroke   [] History of TIA  [] Aphasia   [] Vissual changes   [] Weakness or numbness in arm   [] Weakness or numbness in leg Musculoskeletal:   [] Joint swelling   [] Joint pain   [] Low back pain Hematologic:  [] Easy bruising  [] Easy bleeding   [] Hypercoagulable state   [] Anemic Gastrointestinal:  [] Diarrhea   [] Vomiting  [] Gastroesophageal reflux/heartburn   [] Difficulty swallowing. Genitourinary:  []   Chronic kidney disease   [] Difficult urination  [] Frequent urination   [] Blood in urine Skin:  [] Rashes   [] Ulcers  Psychological:  [] History of anxiety   []  History of major depression.  Physical Examination  Vitals:   11/18/16 1322  BP: (!) 145/75  Pulse: 78  SpO2: (!) 16%  Weight: 157 lb (71.2 kg)  Height: 4\' 11"  (1.499 m)   Body mass index is 31.71 kg/m. Gen: WD/WN, NAD Head: [place]/AT, No temporalis wasting.    Ear/Nose/Throat: Hearing grossly intact, nares w/o erythema or drainage, poor dentition Eyes: PER, EOMI, sclera nonicteric.  Neck: Supple, no masses.  No bruit or JVD.  Pulmonary:  Good air movement, clear to auscultation bilaterally, no use of accessory muscles.  Cardiac: RRR, normal S1, S2, no Murmurs. Vascular: large varicose veins bilaterally Rt>Lt >3mm mild venous stasis changes of the skin Vessel Right Left  Radial Palpable Palpable  Ulnar Palpable Palpable  Brachial Palpable Palpable  Carotid Palpable Palpable  Femoral Palpable Palpable  Popliteal Palpable Palpable  PT Palpable Palpable  DP Palpable Palpable   Gastrointestinal: soft, non-distended. No guarding/no peritoneal signs.  Musculoskeletal: M/S 5/5 throughout.  No deformity or atrophy.  Neurologic: CN 2-12 intact. Pain and light touch intact in extremities.  Symmetrical.  Speech is fluent. Motor exam as listed above. Psychiatric: Judgment intact, Mood & affect appropriate for pt's clinical situation. Dermatologic: venous rashes but no ulcers noted.  No changes consistent with cellulitis. Lymph : No Cervical lymphadenopathy, no lichenification or skin changes of chronic lymphedema.  CBC Lab Results  Component Value Date   WBC 7.1 10/12/2016   HGB 11.4 (L) 10/12/2016   HCT 33.0 (L) 10/12/2016   MCV 83.9 10/12/2016   PLT 247 10/12/2016    BMET    Component Value Date/Time   NA 138 10/12/2016 1055   K 4.6 10/12/2016 1055   CL 106 10/12/2016 1055   CO2 22 10/12/2016 1055   GLUCOSE 179 (H) 10/12/2016 1055   BUN 37 (H) 10/12/2016 1055   CREATININE 2.07 (H) 10/12/2016 1055   CALCIUM 8.7 (L) 10/12/2016 1055   GFRNONAA 22 (L) 10/12/2016 1055   GFRAA 26 (L) 10/12/2016 1055   CrCl cannot be calculated (Patient's most recent lab result is older than the maximum 21 days allowed.).  COAG No results found for: INR, PROTIME  Radiology No results found.  Assessment/Plan 1. Varicose veins of both lower  extremities with inflammation  Recommend:  The patient has large symptomatic varicose veins that are painful and associated with swelling.  I have had a long discussion with the patient regarding  varicose veins and why they cause symptoms.  Patient will begin wearing graduated compression stockings class 1 on a daily basis, beginning first thing in the morning and removing them in the evening. The patient is instructed specifically not to sleep in the stockings.    The patient  will also begin using over-the-counter analgesics such as Motrin 600 mg po TID to help control the symptoms.    In addition, behavioral modification including elevation during the day will be initiated.    Pending the results of these changes the  patient will be reevaluated in three months.   An  ultrasound of the venous system will be obtained.   Further plans will be based on the ultrasound results and whether conservative therapies are successful at eliminating the pain and swelling.  - VAS [place] LOWER EXTREMITY VENOUS REFLUX; Future  2. Pain in both lower extremities See #1  3. Chronic venous insufficiency See #1  4. Swelling of limb See #1    Hortencia Pilar, MD  11/18/2016 2:39 PM

## 2017-01-02 ENCOUNTER — Ambulatory Visit (INDEPENDENT_AMBULATORY_CARE_PROVIDER_SITE_OTHER): Payer: Medicare Other

## 2017-01-02 ENCOUNTER — Ambulatory Visit (INDEPENDENT_AMBULATORY_CARE_PROVIDER_SITE_OTHER): Payer: Medicare Other | Admitting: Vascular Surgery

## 2017-01-02 ENCOUNTER — Encounter (INDEPENDENT_AMBULATORY_CARE_PROVIDER_SITE_OTHER): Payer: Self-pay | Admitting: Vascular Surgery

## 2017-01-02 VITALS — BP 160/79 | HR 79 | Resp 16 | Ht 58.5 in | Wt 162.0 lb

## 2017-01-02 DIAGNOSIS — M79604 Pain in right leg: Secondary | ICD-10-CM

## 2017-01-02 DIAGNOSIS — M79605 Pain in left leg: Secondary | ICD-10-CM

## 2017-01-02 DIAGNOSIS — I8312 Varicose veins of left lower extremity with inflammation: Secondary | ICD-10-CM | POA: Diagnosis not present

## 2017-01-02 DIAGNOSIS — I8311 Varicose veins of right lower extremity with inflammation: Secondary | ICD-10-CM

## 2017-01-02 DIAGNOSIS — I872 Venous insufficiency (chronic) (peripheral): Secondary | ICD-10-CM

## 2017-01-02 NOTE — Progress Notes (Signed)
Subjective:    Patient ID: Leah Davies, female    DOB: May 27, 1941, 76 y.o.   MRN: [id number] Chief Complaint  Patient presents with  . Re-evaluation    Ultrasound follow up   Patient last seen on 11/18/16 for evaluation of symptomatic varicose veins. The patient relates burning and stinging which worsens steadily throughout the course of the day, particularly with standing. The patient also notes an aching and throbbing pain over the varicosities, particularly with prolonged dependent positions. The patient also notes that during hot weather the symptoms are greatly intensified. Since her initial visit, the patient has been wearing medical grade one compression, elevating her legs and remaining active with minimal relief in symptoms requiring the use of OTC anti-inflammatories. The patient states the pain from the varicose veins interferes with work, daily exercise, shopping and household maintenance. At this point, the symptoms are persistent and severe enough that they're having a negative impact on lifestyle and are interfering with daily activities. The patient underwent a bilateral venous duplex which was notable for venous incompetence of the bilateral great saphenous veins. No DVT or SVT.    Review of Systems  Constitutional: Negative.   HENT: Negative.   Eyes: Negative.   Respiratory: Negative.   Cardiovascular: Positive for leg swelling.       Painful varicose veins.   Endocrine: Negative.   Genitourinary: Negative.   Musculoskeletal: Negative.   Skin: Negative.   Allergic/Immunologic: Negative.   Neurological: Negative.   Hematological: Negative.   Psychiatric/Behavioral: Negative.       Objective:   Physical Exam  Constitutional: She is oriented to person, place, and time. She appears well-developed and well-nourished. No distress.  HENT:  Head: Normocephalic and atraumatic.  Eyes: Conjunctivae are normal. Pupils are equal, round, and reactive to light.  Neck: Normal  range of motion.  Cardiovascular: Normal rate, regular rhythm, normal heart sounds and intact distal pulses.   Pulses:      Radial pulses are 2+ on the right side, and 2+ on the left side.       Dorsalis pedis pulses are 2+ on the right side, and 2+ on the left side.       Posterior tibial pulses are 2+ on the right side, and 2+ on the left side.  Pulmonary/Chest: Effort normal.  Musculoskeletal: Normal range of motion. She exhibits edema (Moderate Bilateral Edema).  Neurological: She is alert and oriented to person, place, and time.  Skin: She is not diaphoretic.  >1cm scattered varicose veins noted bilaterally  Psychiatric: She has a normal mood and affect. Her behavior is normal. Judgment and thought content normal.  Vitals reviewed.  BP (!) 160/79 (BP Location: Right Arm)   Pulse 79   Resp 16   Ht 4' 10.5" (1.486 m)   Wt 162 lb (73.5 kg)   BMI 33.28 kg/m   Past Medical History:  Diagnosis Date  . Chronic kidney disease    renal insufficiency  . Complication of anesthesia    Vital signs low during colonoscopy  . Elevated lipids   . GERD (gastroesophageal reflux disease)   . Hypertension    Social History   Social History  . Marital status: Single    Spouse name: N/A  . Number of children: N/A  . Years of education: N/A   Occupational History  . Not on file.   Social History Main Topics  . Smoking status: Never Smoker  . Smokeless tobacco: Never Used  . Alcohol use  Yes     Comment: rare  . Drug use: No  . Sexual activity: Not on file   Other Topics Concern  . Not on file   Social History Narrative  . No narrative on file   Past Surgical History:  Procedure Laterality Date  . BREAST EXCISIONAL BIOPSY Left 2010   benign  . BREAST SURGERY     Lt breast biopsy  . KNEE ARTHROSCOPY Left 10/05/2015   Procedure: ARTHROSCOPY KNEE partial medial and lateral meniscectomy, lateral release for sublux patella;  Surgeon: Hessie Knows, MD;  Location: [facility] ORS;   Service: Orthopedics;  Laterality: Left;   No family history on file.  Allergies  Allergen Reactions  . Amlodipine     Other reaction(s): Unknown  . Azithromycin     Other reaction(s): Unknown      Assessment & Plan:  Patient last seen on 11/18/16 for evaluation of symptomatic varicose veins. The patient relates burning and stinging which worsens steadily throughout the course of the day, particularly with standing. The patient also notes an aching and throbbing pain over the varicosities, particularly with prolonged dependent positions. The patient also notes that during hot weather the symptoms are greatly intensified. Since her initial visit, the patient has been wearing medical grade one compression, elevating her legs and remaining active with minimal relief in symptoms requiring the use of OTC anti-inflammatories. The patient states the pain from the varicose veins interferes with work, daily exercise, shopping and household maintenance. At this point, the symptoms are persistent and severe enough that they're having a negative impact on lifestyle and are interfering with daily activities. The patient underwent a bilateral venous duplex which was notable for venous incompetence of the bilateral great saphenous veins. No DVT or SVT.   1. Chronic venous insufficiency - New Continue conservative therapy for one more month for a total of three months. Patient seem to be failing conservative therapy. We discussed compression and elevation today. Follow up in one month to assess conservative therapy and see if laser ablation is appropriate.  2. Pain in both lower extremities - Stable Patient with significant reflux in bilateral GSV. Continue conservative therapy for one more month for a total of three months. Patient seem to be failing conservative therapy. We discussed compression and elevation today. Follow up in one month to assess conservative therapy and see if laser ablation is  appropriate.   3. Varicose veins of both lower extremities with inflammation - Stable Patient with significant reflux in bilateral GSV. Continue conservative therapy for one more month for a total of three months. Patient seem to be failing conservative therapy. We discussed compression and elevation today. Follow up in one month to assess conservative therapy and see if laser ablation is appropriate.  Current Outpatient Prescriptions on File Prior to Visit  Medication Sig Dispense Refill  . acetaminophen (TYLENOL) 500 MG tablet Take 500 mg by mouth as needed.    . cefUROXime (CEFTIN) 250 MG tablet Take 1 tablet (250 mg total) by mouth 2 (two) times daily with a meal. 10 tablet 0  . dextromethorphan-guaiFENesin (MUCINEX DM) 30-600 MG 12hr tablet Take 1 tablet by mouth 2 (two) times daily. 10 tablet 0  . famotidine (PEPCID) 20 MG tablet Take 1 tablet (20 mg total) by mouth 2 (two) times daily. 60 tablet 0  . meclizine (ANTIVERT) 25 MG tablet Take 25 mg by mouth 3 (three) times daily as needed for dizziness. Reported on 10/05/2015    . omeprazole (PRILOSEC)  40 MG capsule Take 40 mg by mouth daily.    . ondansetron (ZOFRAN ODT) 4 MG disintegrating tablet Take 1 tablet (4 mg total) by mouth every 8 (eight) hours as needed for nausea or vomiting. 20 tablet 0  . sucralfate (CARAFATE) 1 g tablet Take 1 tablet (1 g total) by mouth 4 (four) times daily. 120 tablet 1  . tiZANidine (ZANAFLEX) 4 MG tablet Take 4 mg by mouth at bedtime.     No current facility-administered medications on file prior to visit.     There are no Patient Instructions on file for this visit. No Follow-up on file.   Allisha Harter A Gianne Shugars, PA-C

## 2017-02-13 ENCOUNTER — Other Ambulatory Visit: Payer: Self-pay | Admitting: Neurology

## 2017-02-13 DIAGNOSIS — H9313 Tinnitus, bilateral: Secondary | ICD-10-CM

## 2017-02-17 ENCOUNTER — Ambulatory Visit (INDEPENDENT_AMBULATORY_CARE_PROVIDER_SITE_OTHER): Payer: Medicare Other | Admitting: Vascular Surgery

## 2017-02-17 NOTE — Progress Notes (Deleted)
MRN : [id number]  Leah Davies is a 76 y.o. ([birth year]) female who presents with chief complaint of No chief complaint on file. Marland Kitchen  History of Present Illness: The patient returns for followup evaluation 3 months after the initial visit. The patient continues to have pain in the lower extremities with dependency. The pain is lessened with elevation. Graduated compression stockings, Class I (20-30 mmHg), have been worn but the stockings do not eliminate the leg pain. Over-the-counter analgesics do not improve the symptoms. The degree of discomfort continues to interfere with daily activities. The patient notes the pain in the legs is causing problems with daily exercise, at the workplace and even with household activities and maintenance such as standing in the kitchen preparing meals and doing dishes.   Venous ultrasound shows normal deep venous system, no evidence of acute or chronic DVT.  Superficial reflux is present in the ***  No outpatient prescriptions have been marked as taking for the 02/17/17 encounter (Appointment) with Delana Meyer, Dolores Lory, MD.    Past Medical History:  Diagnosis Date  . Chronic kidney disease    renal insufficiency  . Complication of anesthesia    Vital signs low during colonoscopy  . Elevated lipids   . GERD (gastroesophageal reflux disease)   . Hypertension     Past Surgical History:  Procedure Laterality Date  . BREAST EXCISIONAL BIOPSY Left 2010   benign  . BREAST SURGERY     Lt breast biopsy  . KNEE ARTHROSCOPY Left 10/05/2015   Procedure: ARTHROSCOPY KNEE partial medial and lateral meniscectomy, lateral release for sublux patella;  Surgeon: Hessie Knows, MD;  Location: [facility] ORS;  Service: Orthopedics;  Laterality: Left;    Social History Social History  Substance Use Topics  . Smoking status: Never Smoker  . Smokeless tobacco: Never Used  . Alcohol use Yes     Comment: rare    Family History No family history on file.  Allergies    Allergen Reactions  . Amlodipine     Other reaction(s): Unknown  . Azithromycin     Other reaction(s): Unknown     REVIEW OF SYSTEMS (Negative unless checked)  Constitutional: [] Weight loss  [] Fever  [] Chills Cardiac: [] Chest pain   [] Chest pressure   [] Palpitations   [] Shortness of breath when laying flat   [] Shortness of breath with exertion. Vascular:  [] Pain in legs with walking   [x] Pain in legs with standing  [] History of DVT   [] Phlebitis   [x] Swelling in legs   [x] Varicose veins   [] Non-healing ulcers Pulmonary:   [] Uses home oxygen   [] Productive cough   [] Hemoptysis   [] Wheeze  [] COPD   [] Asthma Neurologic:  [] Dizziness   [] Seizures   [] History of stroke   [] History of TIA  [] Aphasia   [] Vissual changes   [] Weakness or numbness in arm   [] Weakness or numbness in leg Musculoskeletal:   [] Joint swelling   [] Joint pain   [] Low back pain Hematologic:  [] Easy bruising  [] Easy bleeding   [] Hypercoagulable state   [] Anemic Gastrointestinal:  [] Diarrhea   [] Vomiting  [] Gastroesophageal reflux/heartburn   [] Difficulty swallowing. Genitourinary:  [x] Chronic kidney disease   [] Difficult urination  [] Frequent urination   [] Blood in urine Skin:  [] Rashes   [] Ulcers  Psychological:  [] History of anxiety   []  History of major depression.  Physical Examination  There were no vitals filed for this visit. There is no height or weight on file to calculate BMI. Gen: WD/WN, NAD Head:  [place]/AT, No temporalis wasting.  Ear/Nose/Throat: Hearing grossly intact, nares w/o erythema or drainage Eyes: PER, EOMI, sclera nonicteric.  Neck: Supple, no large masses.   Pulmonary:  Good air movement, no audible wheezing bilaterally, no use of accessory muscles.  Cardiac: RRR, no JVD Vascular: *** Vessel Right Left  Radial Palpable Palpable  Ulnar Palpable Palpable  Brachial Palpable Palpable  Carotid Palpable Palpable  Femoral Palpable Palpable  Popliteal Palpable Palpable  PT Palpable Palpable  DP  Palpable Palpable  Gastrointestinal: Non-distended. No guarding/no peritoneal signs.  Musculoskeletal: M/S 5/5 throughout.  No deformity or atrophy.  Neurologic: CN 2-12 intact. Symmetrical.  Speech is fluent. Motor exam as listed above. Psychiatric: Judgment intact, Mood & affect appropriate for pt's clinical situation. Dermatologic: No rashes or ulcers noted.  No changes consistent with cellulitis. Lymph : No lichenification or skin changes of chronic lymphedema.  CBC Lab Results  Component Value Date   WBC 7.1 10/12/2016   HGB 11.4 (L) 10/12/2016   HCT 33.0 (L) 10/12/2016   MCV 83.9 10/12/2016   PLT 247 10/12/2016    BMET    Component Value Date/Time   NA 138 10/12/2016 1055   K 4.6 10/12/2016 1055   CL 106 10/12/2016 1055   CO2 22 10/12/2016 1055   GLUCOSE 179 (H) 10/12/2016 1055   BUN 37 (H) 10/12/2016 1055   CREATININE 2.07 (H) 10/12/2016 1055   CALCIUM 8.7 (L) 10/12/2016 1055   GFRNONAA 22 (L) 10/12/2016 1055   GFRAA 26 (L) 10/12/2016 1055   CrCl cannot be calculated (Patient's most recent lab result is older than the maximum 21 days allowed.).  COAG No results found for: INR, PROTIME  Radiology No results found.  Assessment/Plan 1. Chronic venous insufficiency ***  2. Varicose veins of both lower extremities with inflammation ***  3. Pain in both lower extremities ***  4. Swelling of limb ***    Hortencia Pilar, MD  02/17/2017 10:25 AM

## 2017-02-27 ENCOUNTER — Ambulatory Visit: Payer: Medicare Other

## 2018-03-17 ENCOUNTER — Other Ambulatory Visit: Payer: Self-pay | Admitting: Primary Care

## 2018-03-17 DIAGNOSIS — R42 Dizziness and giddiness: Secondary | ICD-10-CM

## 2018-03-23 ENCOUNTER — Ambulatory Visit
Admission: RE | Admit: 2018-03-23 | Discharge: 2018-03-23 | Disposition: A | Discharge status: Home / Self Care | Payer: Medicare HMO | Source: Ambulatory Visit | Attending: Primary Care | Admitting: Primary Care

## 2018-03-23 DIAGNOSIS — I6522 Occlusion and stenosis of left carotid artery: Secondary | ICD-10-CM | POA: Insufficient documentation

## 2018-03-23 DIAGNOSIS — R42 Dizziness and giddiness: Secondary | ICD-10-CM | POA: Diagnosis present

## 2018-08-06 ENCOUNTER — Other Ambulatory Visit: Payer: Self-pay | Admitting: Primary Care

## 2018-08-06 DIAGNOSIS — Z1231 Encounter for screening mammogram for malignant neoplasm of breast: Secondary | ICD-10-CM

## 2018-09-02 ENCOUNTER — Ambulatory Visit
Admission: RE | Admit: 2018-09-02 | Discharge: 2018-09-02 | Disposition: A | Discharge status: Home / Self Care | Payer: Medicare HMO | Source: Ambulatory Visit | Attending: Primary Care | Admitting: Primary Care

## 2018-09-02 DIAGNOSIS — Z1231 Encounter for screening mammogram for malignant neoplasm of breast: Secondary | ICD-10-CM | POA: Insufficient documentation

## 2019-05-27 ENCOUNTER — Other Ambulatory Visit: Payer: Self-pay | Admitting: Primary Care

## 2019-05-27 DIAGNOSIS — M810 Age-related osteoporosis without current pathological fracture: Secondary | ICD-10-CM

## 2020-02-19 ENCOUNTER — Other Ambulatory Visit: Payer: Self-pay

## 2020-02-19 ENCOUNTER — Emergency Department: Payer: Medicare HMO

## 2020-02-19 ENCOUNTER — Encounter: Payer: Self-pay | Admitting: Emergency Medicine

## 2020-02-19 ENCOUNTER — Inpatient Hospital Stay
Admission: EM | Admit: 2020-02-19 | Discharge: 2020-02-23 | DRG: 683 | Disposition: A | Discharge status: Home / Self Care | Payer: Medicare HMO | Attending: Internal Medicine | Admitting: Internal Medicine

## 2020-02-19 DIAGNOSIS — T461X5A Adverse effect of calcium-channel blockers, initial encounter: Secondary | ICD-10-CM | POA: Diagnosis present

## 2020-02-19 DIAGNOSIS — E875 Hyperkalemia: Secondary | ICD-10-CM | POA: Diagnosis present

## 2020-02-19 DIAGNOSIS — D631 Anemia in chronic kidney disease: Secondary | ICD-10-CM | POA: Diagnosis present

## 2020-02-19 DIAGNOSIS — N2581 Secondary hyperparathyroidism of renal origin: Secondary | ICD-10-CM | POA: Diagnosis present

## 2020-02-19 DIAGNOSIS — Z888 Allergy status to other drugs, medicaments and biological substances status: Secondary | ICD-10-CM

## 2020-02-19 DIAGNOSIS — Z803 Family history of malignant neoplasm of breast: Secondary | ICD-10-CM

## 2020-02-19 DIAGNOSIS — N390 Urinary tract infection, site not specified: Secondary | ICD-10-CM | POA: Diagnosis present

## 2020-02-19 DIAGNOSIS — B9620 Unspecified Escherichia coli [E. coli] as the cause of diseases classified elsewhere: Secondary | ICD-10-CM | POA: Diagnosis present

## 2020-02-19 DIAGNOSIS — Z20822 Contact with and (suspected) exposure to covid-19: Secondary | ICD-10-CM | POA: Diagnosis present

## 2020-02-19 DIAGNOSIS — Z87440 Personal history of urinary (tract) infections: Secondary | ICD-10-CM

## 2020-02-19 DIAGNOSIS — E872 Acidosis: Secondary | ICD-10-CM | POA: Diagnosis present

## 2020-02-19 DIAGNOSIS — I872 Venous insufficiency (chronic) (peripheral): Secondary | ICD-10-CM | POA: Diagnosis present

## 2020-02-19 DIAGNOSIS — K219 Gastro-esophageal reflux disease without esophagitis: Secondary | ICD-10-CM | POA: Diagnosis present

## 2020-02-19 DIAGNOSIS — N289 Disorder of kidney and ureter, unspecified: Secondary | ICD-10-CM | POA: Diagnosis not present

## 2020-02-19 DIAGNOSIS — R001 Bradycardia, unspecified: Secondary | ICD-10-CM | POA: Diagnosis present

## 2020-02-19 DIAGNOSIS — N179 Acute kidney failure, unspecified: Secondary | ICD-10-CM | POA: Diagnosis present

## 2020-02-19 DIAGNOSIS — I129 Hypertensive chronic kidney disease with stage 1 through stage 4 chronic kidney disease, or unspecified chronic kidney disease: Secondary | ICD-10-CM

## 2020-02-19 DIAGNOSIS — Z79899 Other long term (current) drug therapy: Secondary | ICD-10-CM | POA: Diagnosis not present

## 2020-02-19 DIAGNOSIS — N170 Acute kidney failure with tubular necrosis: Secondary | ICD-10-CM | POA: Diagnosis not present

## 2020-02-19 DIAGNOSIS — R531 Weakness: Secondary | ICD-10-CM

## 2020-02-19 DIAGNOSIS — N185 Chronic kidney disease, stage 5: Secondary | ICD-10-CM | POA: Diagnosis present

## 2020-02-19 DIAGNOSIS — R112 Nausea with vomiting, unspecified: Secondary | ICD-10-CM | POA: Diagnosis not present

## 2020-02-19 DIAGNOSIS — N184 Chronic kidney disease, stage 4 (severe): Secondary | ICD-10-CM

## 2020-02-19 DIAGNOSIS — I10 Essential (primary) hypertension: Secondary | ICD-10-CM | POA: Diagnosis not present

## 2020-02-19 DIAGNOSIS — Z881 Allergy status to other antibiotic agents status: Secondary | ICD-10-CM

## 2020-02-19 DIAGNOSIS — D638 Anemia in other chronic diseases classified elsewhere: Secondary | ICD-10-CM | POA: Diagnosis present

## 2020-02-19 DIAGNOSIS — I120 Hypertensive chronic kidney disease with stage 5 chronic kidney disease or end stage renal disease: Secondary | ICD-10-CM | POA: Diagnosis present

## 2020-02-19 DIAGNOSIS — N178 Other acute kidney failure: Secondary | ICD-10-CM | POA: Diagnosis not present

## 2020-02-19 LAB — CBC
HCT: 27.1 % — ABNORMAL LOW (ref 36.0–46.0)
Hemoglobin: 9 g/dL — ABNORMAL LOW (ref 12.0–15.0)
MCH: 28.5 pg (ref 26.0–34.0)
MCHC: 33.2 g/dL (ref 30.0–36.0)
MCV: 85.8 fL (ref 80.0–100.0)
Platelets: 231 10*3/uL (ref 150–400)
RBC: 3.16 MIL/uL — ABNORMAL LOW (ref 3.87–5.11)
RDW: 13.2 % (ref 11.5–15.5)
WBC: 12.6 10*3/uL — ABNORMAL HIGH (ref 4.0–10.5)
nRBC: 0 % (ref 0.0–0.2)

## 2020-02-19 LAB — URINALYSIS, COMPLETE (UACMP) WITH MICROSCOPIC
Bilirubin Urine: NEGATIVE
Glucose, UA: NEGATIVE mg/dL
Hgb urine dipstick: NEGATIVE
Ketones, ur: NEGATIVE mg/dL
Nitrite: NEGATIVE
Protein, ur: 100 mg/dL — AB
Specific Gravity, Urine: 1.014 (ref 1.005–1.030)
WBC, UA: >50 WBC/hpf — ABNORMAL HIGH (ref 0–5)
pH: 5 (ref 5.0–8.0)

## 2020-02-19 LAB — COMPREHENSIVE METABOLIC PANEL
ALT: 16 U/L (ref 0–44)
AST: 21 U/L (ref 15–41)
Albumin: 4 g/dL (ref 3.5–5.0)
Alkaline Phosphatase: 101 U/L (ref 38–126)
Anion gap: 12 (ref 5–15)
BUN: 75 mg/dL — ABNORMAL HIGH (ref 8–23)
CO2: 18 mmol/L — ABNORMAL LOW (ref 22–32)
Calcium: 7.7 mg/dL — ABNORMAL LOW (ref 8.9–10.3)
Chloride: 103 mmol/L (ref 98–111)
Creatinine, Ser: 5.33 mg/dL — ABNORMAL HIGH (ref 0.44–1.00)
GFR calc Af Amer: 8 mL/min — ABNORMAL LOW (ref >60)
GFR calc non Af Amer: 7 mL/min — ABNORMAL LOW (ref >60)
Glucose, Bld: 170 mg/dL — ABNORMAL HIGH (ref 70–99)
Potassium: 6.1 mmol/L — ABNORMAL HIGH (ref 3.5–5.1)
Sodium: 133 mmol/L — ABNORMAL LOW (ref 135–145)
Total Bilirubin: 0.7 mg/dL (ref 0.3–1.2)
Total Protein: 7.2 g/dL (ref 6.5–8.1)

## 2020-02-19 LAB — SARS CORONAVIRUS 2 BY RT PCR (HOSPITAL ORDER, PERFORMED IN ~~LOC~~ HOSPITAL LAB): SARS Coronavirus 2: NEGATIVE

## 2020-02-19 LAB — TROPONIN I (HIGH SENSITIVITY): Troponin I (High Sensitivity): 9 ng/L (ref <18)

## 2020-02-19 LAB — LIPASE, BLOOD: Lipase: 67 U/L — ABNORMAL HIGH (ref 11–51)

## 2020-02-19 LAB — GLUCOSE, CAPILLARY: Glucose-Capillary: 156 mg/dL — ABNORMAL HIGH (ref 70–99)

## 2020-02-19 MED ORDER — SODIUM CHLORIDE 0.9 % IV SOLN
1.0000 g | INTRAVENOUS | Status: DC
  Administered 2020-02-19 – 2020-02-22 (×4): 1 g via INTRAVENOUS
  Filled 2020-02-19: qty 10
  Filled 2020-02-19 (×2): qty 1
  Filled 2020-02-19: qty 10
  Filled 2020-02-19: qty 1

## 2020-02-19 MED ORDER — ALUM & MAG HYDROXIDE-SIMETH 200-200-20 MG/5ML PO SUSP
30.0000 mL | Freq: Once | ORAL | Status: AC
  Administered 2020-02-19: 30 mL via ORAL
  Filled 2020-02-19: qty 30

## 2020-02-19 MED ORDER — LIDOCAINE VISCOUS HCL 2 % MT SOLN
15.0000 mL | Freq: Once | OROMUCOSAL | Status: AC
  Administered 2020-02-19: 15 mL via ORAL
  Filled 2020-02-19: qty 15

## 2020-02-19 MED ORDER — SODIUM BICARBONATE 8.4 % IV SOLN
50.0000 meq | Freq: Once | INTRAVENOUS | Status: AC
  Administered 2020-02-19: 50 meq via INTRAVENOUS
  Filled 2020-02-19: qty 50

## 2020-02-19 MED ORDER — SODIUM CHLORIDE 0.9 % IV BOLUS
1000.0000 mL | Freq: Once | INTRAVENOUS | Status: AC
  Administered 2020-02-19: 1000 mL via INTRAVENOUS

## 2020-02-19 MED ORDER — ATORVASTATIN CALCIUM 80 MG PO TABS
80.0000 mg | ORAL_TABLET | Freq: Every day | ORAL | Status: DC
  Administered 2020-02-20 – 2020-02-23 (×4): 80 mg via ORAL
  Filled 2020-02-19 (×4): qty 1

## 2020-02-19 MED ORDER — PANTOPRAZOLE SODIUM 40 MG PO TBEC
40.0000 mg | DELAYED_RELEASE_TABLET | Freq: Every day | ORAL | Status: DC
  Administered 2020-02-20 – 2020-02-23 (×4): 40 mg via ORAL
  Filled 2020-02-19 (×4): qty 1

## 2020-02-19 MED ORDER — HEPARIN SODIUM (PORCINE) 5000 UNIT/ML IJ SOLN
5000.0000 [IU] | Freq: Three times a day (TID) | INTRAMUSCULAR | Status: DC
  Administered 2020-02-19 – 2020-02-23 (×11): 5000 [IU] via SUBCUTANEOUS
  Filled 2020-02-19 (×11): qty 1

## 2020-02-19 MED ORDER — INSULIN ASPART 100 UNIT/ML IV SOLN
5.0000 [IU] | Freq: Once | INTRAVENOUS | Status: AC
  Administered 2020-02-19: 5 [IU] via INTRAVENOUS
  Filled 2020-02-19: qty 0.05

## 2020-02-19 MED ORDER — SODIUM CHLORIDE 0.9 % IV SOLN
1.0000 g | Freq: Once | INTRAVENOUS | Status: AC
  Administered 2020-02-19: 1 g via INTRAVENOUS
  Filled 2020-02-19: qty 10

## 2020-02-19 MED ORDER — DEXTROSE 50 % IV SOLN
1.0000 | Freq: Once | INTRAVENOUS | Status: AC
  Administered 2020-02-19: 50 mL via INTRAVENOUS
  Filled 2020-02-19: qty 50

## 2020-02-19 MED ORDER — SODIUM CHLORIDE 0.9% FLUSH
3.0000 mL | Freq: Once | INTRAVENOUS | Status: AC
  Administered 2020-02-20: 3 mL via INTRAVENOUS

## 2020-02-19 NOTE — H&P (Signed)
History and Physical    Leah Davies MWU:[id number] DOB: 05/04/1941 DOA: 02/19/2020  PCP: Freddy Finner, NP  Patient coming from: Home, daughter at bedside  I have personally briefly reviewed patient's old medical records in [place]  Chief Complaint: Nausea, vomiting and diarrhea with dizziness  HPI: Leah Davies is a 79 y.o. female with medical history significant for Hx of HTN, CKD stage IV and chronic venous insufficiency who presents with concerns of nausea, vomiting diarrhea and dizziness.  Patient is Spanish-speaking but would like her daughter at bedside to translate rather than having an interpreter. Daughter reports that patient had increased to her diltiazem 2 days ago and has been compliant with it.  Then today prior to taking her morning dose she began to feel dizzy, nauseous and fatigue.  She then thought it was due to her high blood pressure and took her second dose of diltiazem and felt more dizzy and began to have nausea, vomiting and diarrhea.  She had about 3 episodes of each.  She also notes epigastric abdominal pain although that has been ongoing for about 3 weeks.  She has history of heartburn but does not take her medication for that daily.  Also notes dysuria when urinating in the ED today.  She denies any fever or sick contact.  Denies any chest pain or shortness of breath.  In the ED, She was afebrile, bradycardic down to high 40s showing a junctional rhythm with frequent PVCs, normotensive on room air.   CBC showed leukocytosis of 12.6, anemia with hemoglobin of 9 from 10.6 in December. Negative FOBT.   Sodium of 133, K of 6.1, glucose of 170, creatinine of 5.33 with last one in March at 4.38, calcium 7.7  UA shows large leukocyte and negative nitrite with rare bacteria and greater than 50 WBC.  The abdomen and pelvis showed no acute abdominal pelvic abnormalities.  5 units Insulin and D50, IV calcium gluconate and Bicarb 30meQ given in the ED. Also  received 1L of IV fluids.  Review of Systems:  Constitutional: No Weight Change, No Fever ENT/Mouth: No sore throat, No Rhinorrhea Eyes: No Eye Pain, No Vision Changes Cardiovascular: No Chest Pain, no SOB Respiratory: No Cough, No Sputum, No Wheezing, no Dyspnea  Gastrointestinal: + Nausea, + Vomiting, + Diarrhea, No Constipation, + Pain Genitourinary: no Urinary Incontinence, No Urgency, No Flank Pain Musculoskeletal: No Arthralgias, No Myalgias Skin: No Skin Lesions, No Pruritus, Neuro: no Weakness, No Numbness,   Psych: No Anxiety/Panic, No Depression, no decrease appetite Heme/Lymph: No Bruising, No Bleeding  Past Medical History:  Diagnosis Date  . Chronic kidney disease    renal insufficiency  . Complication of anesthesia    Vital signs low during colonoscopy  . Elevated lipids   . GERD (gastroesophageal reflux disease)   . Hypertension     Past Surgical History:  Procedure Laterality Date  . BREAST EXCISIONAL BIOPSY Left 2010   benign  . BREAST SURGERY     Lt breast biopsy  . KNEE ARTHROSCOPY Left 10/05/2015   Procedure: ARTHROSCOPY KNEE partial medial and lateral meniscectomy, lateral release for sublux patella;  Surgeon: Hessie Knows, MD;  Location: [facility] ORS;  Service: Orthopedics;  Laterality: Left;     reports that she has never smoked. She has never used smokeless tobacco. She reports current alcohol use. She reports that she does not use drugs.  Allergies  Allergen Reactions  . Amlodipine     Other reaction(s): Unknown  . Azithromycin  Other reaction(s): Unknown    Family History  Problem Relation Age of Onset  . Breast cancer Sister 58     Prior to Admission medications   Medication Sig Start Date End Date Taking? Authorizing Provider  acetaminophen (TYLENOL) 500 MG tablet Take 500 mg by mouth as needed.    [provider]  atorvastatin (LIPITOR) 80 MG tablet 80 mg. 05/19/13   [provider]  cefUROXime (CEFTIN) 250 MG tablet  Take 1 tablet (250 mg total) by mouth 2 (two) times daily with a meal. 10/09/16   Vaughan Basta, MD  colchicine 0.6 MG tablet 0.6 mg. Reported on 01/15/2016 01/27/14   [provider]  dextromethorphan-guaiFENesin (MUCINEX DM) 30-600 MG 12hr tablet Take 1 tablet by mouth 2 (two) times daily. 10/09/16   Vaughan Basta, MD  diltiazem (CARDIZEM CD) 300 MG 24 hr capsule 240 mg. 04/13/13   [provider]  DOCOSAHEXAENOIC ACID PO Take by mouth.    [provider]  famotidine (PEPCID) 20 MG tablet Take 1 tablet (20 mg total) by mouth 2 (two) times daily. 10/12/16   Carrie Mew, MD  lisinopril (PRINIVIL,ZESTRIL) 10 MG tablet Take by mouth.    [provider]  meclizine (ANTIVERT) 25 MG tablet Take 25 mg by mouth 3 (three) times daily as needed for dizziness. Reported on 10/05/2015    [provider]  omeprazole (PRILOSEC) 40 MG capsule Take 40 mg by mouth daily.    [provider]  ondansetron (ZOFRAN ODT) 4 MG disintegrating tablet Take 1 tablet (4 mg total) by mouth every 8 (eight) hours as needed for nausea or vomiting. 10/12/16   Carrie Mew, MD  pantoprazole (PROTONIX) 40 MG tablet Take by mouth. 09/10/16   [provider]  polyethylene glycol powder (GLYCOLAX/MIRALAX) powder Take by mouth. 09/10/16   [provider]  sucralfate (CARAFATE) 1 g tablet Take 1 tablet (1 g total) by mouth 4 (four) times daily. 10/12/16   Carrie Mew, MD  tiZANidine (ZANAFLEX) 4 MG tablet Take 4 mg by mouth at bedtime. 10/05/16   [provider]  traMADol (ULTRAM) 50 MG tablet Take by mouth. 05/15/15   [provider]    Physical Exam: Vitals:   02/19/20 1801 02/19/20 2007  BP: (!) 142/61 (!) 145/131  Pulse: (!) 59 (!) 48  Resp: 16 18  Temp: 98.2 F (36.8 C)   TempSrc: Oral   SpO2: 100% 97%    Constitutional: NAD, calm, comfortable, nontoxic fatigue appearing elderly female laying flat in bed Vitals:    02/19/20 1801 02/19/20 2007  BP: (!) 142/61 (!) 145/131  Pulse: (!) 59 (!) 48  Resp: 16 18  Temp: 98.2 F (36.8 C)   TempSrc: Oral   SpO2: 100% 97%   Eyes: PERRL, lids and conjunctivae normal ENMT: Mucous membranes are moist. Posterior pharynx clear of any exudate or lesions. Neck: normal, supple Respiratory: clear to auscultation bilaterally, no wheezing, no crackles. Normal respiratory effort on room air. No accessory muscle use.  Cardiovascular: Regular rate and rhythm although would be bradycardiac with PVCs at times on telemetry, no murmurs / rubs / gallops. No extremity edema. 2+ pedal pulses.  Abdomen: mild epigastric tenderness, no masses palpated. Bowel sounds positive.  Musculoskeletal: no clubbing / cyanosis. No joint deformity upper and lower extremities. Good ROM, no contractures. Normal muscle tone.  Skin: no rashes, lesions, ulcers. No induration Neurologic: CN 2-12 grossly intact. Sensation intact. Strength 5/5 in all 4.  Psychiatric: Normal judgment and insight.  Alert and oriented x 3. Normal mood.     Labs on Admission: I have personally reviewed following labs and imaging studies  CBC: Recent Labs  Lab 02/19/20 1810  WBC 12.6*  HGB 9.0*  HCT 27.1*  MCV 85.8  PLT 166   Basic Metabolic Panel: Recent Labs  Lab 02/19/20 1810  NA 133*  K 6.1*  CL 103  CO2 18*  GLUCOSE 170*  BUN 75*  CREATININE 5.33*  CALCIUM 7.7*   GFR: CrCl cannot be calculated (Unknown ideal weight.). Liver Function Tests: Recent Labs  Lab 02/19/20 1810  AST 21  ALT 16  ALKPHOS 101  BILITOT 0.7  PROT 7.2  ALBUMIN 4.0   Recent Labs  Lab 02/19/20 1810  LIPASE 67*   No results for input(s): AMMONIA in the last 168 hours. Coagulation Profile: No results for input(s): INR, PROTIME in the last 168 hours. Cardiac Enzymes: No results for input(s): CKTOTAL, CKMB, CKMBINDEX, TROPONINI in the last 168 hours. BNP (last 3 results) No results for input(s): PROBNP in the last  8760 hours. HbA1C: No results for input(s): HGBA1C in the last 72 hours. CBG: No results for input(s): GLUCAP in the last 168 hours. Lipid Profile: No results for input(s): CHOL, HDL, LDLCALC, TRIG, CHOLHDL, LDLDIRECT in the last 72 hours. Thyroid Function Tests: No results for input(s): TSH, T4TOTAL, FREET4, T3FREE, THYROIDAB in the last 72 hours. Anemia Panel: No results for input(s): VITAMINB12, FOLATE, FERRITIN, TIBC, IRON, RETICCTPCT in the last 72 hours. Urine analysis:    Component Value Date/Time   COLORURINE YELLOW (A) 02/19/2020 1809   APPEARANCEUR CLOUDY (A) 02/19/2020 1809   LABSPEC 1.014 02/19/2020 1809   PHURINE 5.0 02/19/2020 1809   GLUCOSEU NEGATIVE 02/19/2020 1809   HGBUR NEGATIVE 02/19/2020 1809   BILIRUBINUR NEGATIVE 02/19/2020 1809   KETONESUR NEGATIVE 02/19/2020 1809   PROTEINUR 100 (A) 02/19/2020 1809   NITRITE NEGATIVE 02/19/2020 1809   LEUKOCYTESUR LARGE (A) 02/19/2020 1809    Radiological Exams on Admission: CT ABDOMEN PELVIS WO CONTRAST  Result Date: 02/19/2020 CLINICAL DATA:  Nausea and vomiting. EXAM: CT ABDOMEN AND PELVIS WITHOUT CONTRAST TECHNIQUE: Multidetector CT imaging of the abdomen and pelvis was performed following the standard protocol without IV contrast. COMPARISON:  None. FINDINGS: Lower chest: The lung bases are clear. The heart size is normal. There are coronary artery calcifications. Hepatobiliary: The liver is normal. Normal gallbladder.There is no biliary ductal dilation. Pancreas: Normal contours without ductal dilatation. No peripancreatic fluid collection. Spleen: Unremarkable. Adrenals/Urinary Tract: --Adrenal glands: Unremarkable. --Right kidney/ureter: No hydronephrosis or radiopaque kidney stones. --Left kidney/ureter: No hydronephrosis or radiopaque kidney stones. --Urinary bladder: Unremarkable. Stomach/Bowel: --Stomach/Duodenum: No hiatal hernia or other gastric abnormality. Normal duodenal course and caliber. --Small bowel:  Unremarkable. --Colon: Rectosigmoid diverticulosis without acute inflammation. --Appendix: Normal. Vascular/Lymphatic: Atherosclerotic calcification is present within the non-aneurysmal abdominal aorta, without hemodynamically significant stenosis. --No retroperitoneal lymphadenopathy. --No mesenteric lymphadenopathy. --No pelvic or inguinal lymphadenopathy. Reproductive: Unremarkable Other: No ascites or free air. There is a fat containing umbilical hernia. Musculoskeletal. No acute displaced fractures. IMPRESSION: 1. No acute abdominopelvic abnormality. 2. Rectosigmoid diverticulosis without acute inflammation. Aortic Atherosclerosis (ICD10-I70.0). Electronically Signed   By: Constance Holster M.D.   On: 02/19/2020 20:30      Assessment/Plan  Bradycardia Due to elevated diltiazem dose. Hold diltiazem for now.  Monitor with continuous monitoring Also check orthostatic vital signs  Acute on chronic kidney disease stage IV  creatinine of 5.33 with last one in March at 4.38 Has received 1L  of NS fluid in ED Avoid nephrotoxic agent  Hyperkalemia  5 units Insulin and D50, IV calcium gluconate and Bicarb 73meQ given in the ED Repeat BMP in the morning  UTI Patient notes dysuria and UA showing large leukocyte, negative nitrite with rare bacteria and greater than 50 WBC Will start Rocephin.  Urine culture pending.  Hypertension Holding diltiazem. She did not tolerate lisinopril due to hyperkalemia or amlodipine due to lower extremity edema in the past. Might need to transition to hydralazine if increase diltiazem is causing bradycardia.   Hypocalcemia due to CKD On Vit D supplementation  GERD Likely cause of her epigastric pain.  Lipase was slightly elevated but had negative CT abdomen and pelvis scan. She received GI cocktail in the ED and had improvement with her pain Continue PPI  Anemia of chronic disease Stable with hemoglobin of 9  DVT prophylaxis:.Heparin subcu  code Status:  Full Family Communication: Plan discussed with patient and daughter at bedside  disposition Plan: Home with at least 2 midnight stays  Consults called:  Admission status: inpatient  Status is: Inpatient  Remains inpatient appropriate because:Inpatient level of care appropriate due to severity of illness   Dispo: The patient is from: Home              Anticipated d/c is to: Home              Anticipated d/c date is: 2 days              Patient currently is not medically stable to d/c.          Orene Desanctis DO [facility]   If 7PM-7AM, please contact night-coverage [email or web address]   02/19/2020, 10:06 PM

## 2020-02-19 NOTE — ED Notes (Signed)
Provider states no intervention needs to be given on BP of 136/42. RN will continue to monitor and assess pt.

## 2020-02-19 NOTE — ED Notes (Signed)
Provider notified of blood pressure of 136/42

## 2020-02-19 NOTE — ED Notes (Addendum)
Pt has daughter at bedside. Daughter states pt may need dialysis. Pt states abdominal pain. Daughter states pt has had nausuea, vomiting and diarrhea that started today. Daughter has stated pain and ringing to the right ear as well.

## 2020-02-19 NOTE — ED Triage Notes (Signed)
Pt to ED via POV for N/V/D. Pt states that she recently started a new BP medication. Pt took the third dose this morning and got very dizzy then she started having diarrhea and vomiting. Pt states that she feels very weak. Pt is in NAD and was ambulatory to triage.

## 2020-02-19 NOTE — ED Notes (Signed)
First nurse note- MD switched BP meds and now pt feeling sick with NVD per family.  Ambulatory.

## 2020-02-19 NOTE — ED Provider Notes (Signed)
[facility] Emergency Department Provider Note  ____________________________________________  Time seen: Approximately 7:48 PM  I have reviewed the triage vital signs and the nursing notes.   HISTORY  Chief Complaint Emesis and Diarrhea    HPI Leah Davies is a 79 y.o. female that presents to the emergency department for evaluation of nausea, vomiting, diarrhea, abdominal discomfort today.  Patient has had 3 episodes of vomiting and 3 episodes of loose stools today.  No blood in vomit or diarrhea.  No fevers.  Patient sees nephrology for CKD.  Last appointment was 2 days ago.  Her diltiazem was increased.  Yesterday she had medication with food.  She had medication around 10 AM without any food and thinks this may be what was causing her symptoms.  Patient has a headache.  Patient feels like she has GERD currently.  No fevers, shortness of breath, chest pain.  Past Medical History:  Diagnosis Date  . Chronic kidney disease    renal insufficiency  . Complication of anesthesia    Vital signs low during colonoscopy  . Elevated lipids   . GERD (gastroesophageal reflux disease)   . Hypertension     Patient Active Problem List   Diagnosis Date Noted  . Varicose veins of both lower extremities with inflammation 11/18/2016  . Pain in limb 11/18/2016  . Chronic venous insufficiency 11/18/2016  . Swelling of limb 11/18/2016  . Acute kidney insufficiency 10/07/2016    Past Surgical History:  Procedure Laterality Date  . BREAST EXCISIONAL BIOPSY Left 2010   benign  . BREAST SURGERY     Lt breast biopsy  . KNEE ARTHROSCOPY Left 10/05/2015   Procedure: ARTHROSCOPY KNEE partial medial and lateral meniscectomy, lateral release for sublux patella;  Surgeon: Hessie Knows, MD;  Location: [facility] ORS;  Service: Orthopedics;  Laterality: Left;    Prior to Admission medications   Medication Sig Start Date End Date Taking? Authorizing Provider  acetaminophen (TYLENOL)  500 MG tablet Take 500 mg by mouth as needed.    [provider]  atorvastatin (LIPITOR) 80 MG tablet 80 mg. 05/19/13   [provider]  cefUROXime (CEFTIN) 250 MG tablet Take 1 tablet (250 mg total) by mouth 2 (two) times daily with a meal. 10/09/16   Vaughan Basta, MD  colchicine 0.6 MG tablet 0.6 mg. Reported on 01/15/2016 01/27/14   [provider]  dextromethorphan-guaiFENesin (MUCINEX DM) 30-600 MG 12hr tablet Take 1 tablet by mouth 2 (two) times daily. 10/09/16   Vaughan Basta, MD  diltiazem (CARDIZEM CD) 300 MG 24 hr capsule 240 mg. 04/13/13   [provider]  DOCOSAHEXAENOIC ACID PO Take by mouth.    [provider]  famotidine (PEPCID) 20 MG tablet Take 1 tablet (20 mg total) by mouth 2 (two) times daily. 10/12/16   Carrie Mew, MD  lisinopril (PRINIVIL,ZESTRIL) 10 MG tablet Take by mouth.    [provider]  meclizine (ANTIVERT) 25 MG tablet Take 25 mg by mouth 3 (three) times daily as needed for dizziness. Reported on 10/05/2015    [provider]  omeprazole (PRILOSEC) 40 MG capsule Take 40 mg by mouth daily.    [provider]  ondansetron (ZOFRAN ODT) 4 MG disintegrating tablet Take 1 tablet (4 mg total) by mouth every 8 (eight) hours as needed for nausea or vomiting. 10/12/16   Carrie Mew, MD  pantoprazole (PROTONIX) 40 MG tablet Take by mouth. 09/10/16   [provider]  polyethylene glycol powder (GLYCOLAX/MIRALAX)  powder Take by mouth. 09/10/16   [provider]  sucralfate (CARAFATE) 1 g tablet Take 1 tablet (1 g total) by mouth 4 (four) times daily. 10/12/16   Carrie Mew, MD  tiZANidine (ZANAFLEX) 4 MG tablet Take 4 mg by mouth at bedtime. 10/05/16   [provider]  traMADol (ULTRAM) 50 MG tablet Take by mouth. 05/15/15   [provider]    Allergies Amlodipine and Azithromycin  Family History  Problem Relation Age of Onset  . Breast cancer  Sister 37    Social History Social History   Tobacco Use  . Smoking status: Never Smoker  . Smokeless tobacco: Never Used  Substance Use Topics  . Alcohol use: Yes    Comment: rare  . Drug use: No     Review of Systems  Constitutional: No fever/chills Cardiovascular: No chest pain. Respiratory: No cough. No SOB. Gastrointestinal: Positive for nausea, vomiting, abdominal discomfort. Genitourinary: Negative for dysuria. Musculoskeletal: Negative for musculoskeletal pain. Skin: Negative for rash, abrasions, lacerations, ecchymosis. Neurological: Positive for headache.   ____________________________________________   PHYSICAL EXAM:  VITAL SIGNS: ED Triage Vitals  Enc Vitals Group     BP 02/19/20 1801 (!) 142/61     Pulse Rate 02/19/20 1801 (!) 59     Resp 02/19/20 1801 16     Temp 02/19/20 1801 98.2 F (36.8 C)     Temp Source 02/19/20 1801 Oral     SpO2 02/19/20 1801 100 %     Weight --      Height --      Head Circumference --      Peak Flow --      Pain Score 02/19/20 1807 2     Pain Loc --      Pain Edu? --      Excl. in [place]? --      Constitutional: Alert and oriented. Well appearing and in no acute distress. Eyes: Conjunctivae are normal. PERRL. EOMI. Head: Atraumatic. ENT:      Ears:      Nose: No congestion/rhinnorhea.      Mouth/Throat: Mucous membranes are moist.  Neck: No stridor. Cardiovascular: Normal rate, regular rhythm.  Good peripheral circulation. Respiratory: Normal respiratory effort without tachypnea or retractions. Lungs CTAB. Good air entry to the bases with no decreased or absent breath sounds. Gastrointestinal: Bowel sounds 4 quadrants. Soft and nontender to palpation. No guarding or rigidity. No palpable masses. No distention.  Musculoskeletal: Full range of motion to all extremities. No gross deformities appreciated. Neurologic:  Normal speech and language. No gross focal neurologic deficits are appreciated.  Skin:  Skin is warm,  dry and intact. No rash noted. Psychiatric: Mood and affect are normal. Speech and behavior are normal. Patient exhibits appropriate insight and judgement.   ____________________________________________   LABS (all labs ordered are listed, but only abnormal results are displayed)  Labs Reviewed  LIPASE, BLOOD - Abnormal; Notable for the following components:      Result Value   Lipase 67 (*)    All other components within normal limits  COMPREHENSIVE METABOLIC PANEL - Abnormal; Notable for the following components:   Sodium 133 (*)    Potassium 6.1 (*)    CO2 18 (*)    Glucose, Bld 170 (*)    BUN 75 (*)    Creatinine, Ser 5.33 (*)    Calcium 7.7 (*)    GFR calc non Af Amer 7 (*)    GFR calc Af Amer 8 (*)  All other components within normal limits  CBC - Abnormal; Notable for the following components:   WBC 12.6 (*)    RBC 3.16 (*)    Hemoglobin 9.0 (*)    HCT 27.1 (*)    All other components within normal limits  URINALYSIS, COMPLETE (UACMP) WITH MICROSCOPIC - Abnormal; Notable for the following components:   Color, Urine YELLOW (*)    APPearance CLOUDY (*)    Protein, ur 100 (*)    Leukocytes,Ua LARGE (*)    WBC, UA >50 (*)    Bacteria, UA RARE (*)    Non Squamous Epithelial PRESENT (*)    All other components within normal limits  URINE CULTURE  TROPONIN I (HIGH SENSITIVITY)   ____________________________________________  EKG   ____________________________________________  RADIOLOGY Robinette Haines, personally viewed and evaluated these images (plain radiographs) as part of my medical decision making, as well as reviewing the written report by the radiologist.  CT ABDOMEN PELVIS WO CONTRAST  Result Date: 02/19/2020 CLINICAL DATA:  Nausea and vomiting. EXAM: CT ABDOMEN AND PELVIS WITHOUT CONTRAST TECHNIQUE: Multidetector CT imaging of the abdomen and pelvis was performed following the standard protocol without IV contrast. COMPARISON:  None. FINDINGS: Lower  chest: The lung bases are clear. The heart size is normal. There are coronary artery calcifications. Hepatobiliary: The liver is normal. Normal gallbladder.There is no biliary ductal dilation. Pancreas: Normal contours without ductal dilatation. No peripancreatic fluid collection. Spleen: Unremarkable. Adrenals/Urinary Tract: --Adrenal glands: Unremarkable. --Right kidney/ureter: No hydronephrosis or radiopaque kidney stones. --Left kidney/ureter: No hydronephrosis or radiopaque kidney stones. --Urinary bladder: Unremarkable. Stomach/Bowel: --Stomach/Duodenum: No hiatal hernia or other gastric abnormality. Normal duodenal course and caliber. --Small bowel: Unremarkable. --Colon: Rectosigmoid diverticulosis without acute inflammation. --Appendix: Normal. Vascular/Lymphatic: Atherosclerotic calcification is present within the non-aneurysmal abdominal aorta, without hemodynamically significant stenosis. --No retroperitoneal lymphadenopathy. --No mesenteric lymphadenopathy. --No pelvic or inguinal lymphadenopathy. Reproductive: Unremarkable Other: No ascites or free air. There is a fat containing umbilical hernia. Musculoskeletal. No acute displaced fractures. IMPRESSION: 1. No acute abdominopelvic abnormality. 2. Rectosigmoid diverticulosis without acute inflammation. Aortic Atherosclerosis (ICD10-I70.0). Electronically Signed   By: Constance Holster M.D.   On: 02/19/2020 20:30    ____________________________________________    PROCEDURES  Procedure(s) performed:    Procedures    Medications  sodium chloride flush (NS) 0.9 % injection 3 mL (0 mLs Intravenous Hold 02/19/20 2006)  sodium chloride 0.9 % bolus 1,000 mL (has no administration in time range)  alum & mag hydroxide-simeth (MAALOX/MYLANTA) 200-200-20 MG/5ML suspension 30 mL (30 mLs Oral Given 02/19/20 2048)    And  lidocaine (XYLOCAINE) 2 % viscous mouth solution 15 mL (15 mLs Oral Given 02/19/20 2048)      ____________________________________________   INITIAL IMPRESSION / ASSESSMENT AND PLAN / ED COURSE  Pertinent labs & imaging results that were available during my care of the patient were reviewed by me and considered in my medical decision making (see chart for details).  Review of the [place] CSRS was performed in accordance of the [place] prior to dispensing any controlled drugs.  Differential diagnosis includes, but is not limited to, biliary disease (biliary colic, acute cholecystitis, cholangitis, choledocholithiasis, etc), intrathoracic causes for epigastric abdominal pain including ACS, gastritis, duodenitis, pancreatitis, small bowel or large bowel obstruction, abdominal aortic aneurysm, hernia, and ulcer(s).  Patient presents to the emergency department for evaluation of nausea, vomiting, abdominal discomfort, diarrhea today.  Hemoglobin 9.0.  Hemoccult is negative.  Potassium elevated at 6.1.  BUN 75, creatinine 5.33, which are  increased from previous lab work in December of BUN of 7, creatinine of 4.11.  Current GFR is 7, decreased from previous GFR in December 2011.  EKG appears to be in a junctional rhythm.  Patient was given IV fluids, EKG was repeated.  Calcium, insulin, bicarb was ordered.  Patient will be admitted for AKI, hyperkalemia, EKG changes.   OLIA HINDERLITER was evaluated in Emergency Department on 02/19/2020 for the symptoms described in the history of present illness. She was evaluated in the context of the global COVID-19 pandemic, which necessitated consideration that the patient might be at risk for infection with the SARS-CoV-2 virus that causes COVID-19. Institutional protocols and algorithms that pertain to the evaluation of patients at risk for COVID-19 are in a state of rapid change based on information released by regulatory bodies including the CDC and federal and state organizations. These policies and algorithms were followed during the patient's care in the  ED.   ____________________________________________  FINAL CLINICAL IMPRESSION(S) / ED DIAGNOSES  Final diagnoses:  None      NEW MEDICATIONS STARTED DURING THIS VISIT:  ED Discharge Orders    None          This chart was dictated using voice recognition software/Dragon. Despite best efforts to proofread, errors can occur which can change the meaning. Any change was purely unintentional.    Laban Emperor, PA-C 02/19/20 2129    Nance Pear, MD 02/19/20 [id number]

## 2020-02-19 NOTE — ED Notes (Signed)
Pt given a blanket. Family notified to have pt wave at first nurse if she needs something. Family notified again they needed to wait outside per policy.

## 2020-02-20 ENCOUNTER — Other Ambulatory Visit: Payer: Self-pay

## 2020-02-20 ENCOUNTER — Inpatient Hospital Stay: Payer: Medicare HMO

## 2020-02-20 LAB — BASIC METABOLIC PANEL
Anion gap: 8 (ref 5–15)
BUN: 75 mg/dL — ABNORMAL HIGH (ref 8–23)
CO2: 22 mmol/L (ref 22–32)
Calcium: 8.1 mg/dL — ABNORMAL LOW (ref 8.9–10.3)
Chloride: 107 mmol/L (ref 98–111)
Creatinine, Ser: 5.5 mg/dL — ABNORMAL HIGH (ref 0.44–1.00)
GFR calc Af Amer: 8 mL/min — ABNORMAL LOW (ref >60)
GFR calc non Af Amer: 7 mL/min — ABNORMAL LOW (ref >60)
Glucose, Bld: 106 mg/dL — ABNORMAL HIGH (ref 70–99)
Potassium: 5.5 mmol/L — ABNORMAL HIGH (ref 3.5–5.1)
Sodium: 137 mmol/L (ref 135–145)

## 2020-02-20 LAB — VITAMIN B12: Vitamin B-12: 300 pg/mL (ref 180–914)

## 2020-02-20 LAB — RETICULOCYTES
Immature Retic Fract: 15.6 % (ref 2.3–15.9)
RBC.: 2.83 MIL/uL — ABNORMAL LOW (ref 3.87–5.11)
Retic Count, Absolute: 47.8 10*3/uL (ref 19.0–186.0)
Retic Ct Pct: 1.7 % (ref 0.4–3.1)

## 2020-02-20 LAB — IRON AND TIBC
Iron: 67 ug/dL (ref 28–170)
Saturation Ratios: 32 % — ABNORMAL HIGH (ref 10.4–31.8)
TIBC: 210 ug/dL — ABNORMAL LOW (ref 250–450)
UIBC: 143 ug/dL

## 2020-02-20 LAB — CBC
HCT: 24.4 % — ABNORMAL LOW (ref 36.0–46.0)
Hemoglobin: 8.1 g/dL — ABNORMAL LOW (ref 12.0–15.0)
MCH: 28.3 pg (ref 26.0–34.0)
MCHC: 33.2 g/dL (ref 30.0–36.0)
MCV: 85.3 fL (ref 80.0–100.0)
Platelets: 199 10*3/uL (ref 150–400)
RBC: 2.86 MIL/uL — ABNORMAL LOW (ref 3.87–5.11)
RDW: 13.2 % (ref 11.5–15.5)
WBC: 7.9 10*3/uL (ref 4.0–10.5)
nRBC: 0 % (ref 0.0–0.2)

## 2020-02-20 LAB — PHOSPHORUS: Phosphorus: 5.3 mg/dL — ABNORMAL HIGH (ref 2.5–4.6)

## 2020-02-20 LAB — FERRITIN: Ferritin: 90 ng/mL (ref 11–307)

## 2020-02-20 LAB — FOLATE: Folate: 10.4 ng/mL (ref >5.9)

## 2020-02-20 MED ORDER — ONDANSETRON 4 MG PO TBDP
4.0000 mg | ORAL_TABLET | Freq: Three times a day (TID) | ORAL | Status: DC | PRN
  Filled 2020-02-20: qty 1

## 2020-02-20 MED ORDER — HYDRALAZINE HCL 50 MG PO TABS
50.0000 mg | ORAL_TABLET | Freq: Three times a day (TID) | ORAL | Status: DC
  Administered 2020-02-20 – 2020-02-21 (×2): 50 mg via ORAL
  Filled 2020-02-20 (×2): qty 1

## 2020-02-20 MED ORDER — SODIUM ZIRCONIUM CYCLOSILICATE 10 G PO PACK
10.0000 g | PACK | Freq: Once | ORAL | Status: AC
  Administered 2020-02-20: 10 g via ORAL
  Filled 2020-02-20: qty 1

## 2020-02-20 MED ORDER — SODIUM CHLORIDE 0.9 % IV SOLN: INTRAVENOUS | Status: DC

## 2020-02-20 MED ORDER — ONDANSETRON HCL 4 MG/2ML IJ SOLN: 4.0000 mg | Freq: Three times a day (TID) | INTRAMUSCULAR | Status: DC | PRN

## 2020-02-20 MED ORDER — ACETAMINOPHEN 325 MG PO TABS
650.0000 mg | ORAL_TABLET | Freq: Four times a day (QID) | ORAL | Status: DC | PRN
  Administered 2020-02-20 – 2020-02-23 (×5): 650 mg via ORAL
  Filled 2020-02-20 (×5): qty 2

## 2020-02-20 NOTE — Progress Notes (Signed)
Patient off floor for Renal Ultrasound

## 2020-02-20 NOTE — Plan of Care (Signed)
Discussed with patient and family members regarding UTI and Renal disease evaluation. Teaching packet given to them in [place]. Patient seen by Renal consult and explanation for purpose of Renal Ultrasound today.

## 2020-02-20 NOTE — ED Notes (Signed)
Charge RN states she called report to the floor.

## 2020-02-20 NOTE — Consult Note (Signed)
Leah Davies MRN: [id number] DOB/AGE: 02/03/1941 79 y.o. Primary Care Physician:Rubio, Janett Billow, NP Admit date: 02/19/2020 Chief Complaint:  Chief Complaint  Patient presents with  . Emesis  . Diarrhea  Patient does not speak any English the patient daughter who is bilingual is present in the room and has complicated the patient  HPI: Patient is a 79 year old female of [place] descent with a past medical history of hypertension, CKD stage IV, chronic venous insufficiency who came to the ER with chief complaint of nausea vomiting diarrhea and dizziness.  History of present illness dates back to past to 3 days ago when patient correlates her symptoms to increase the dose of diltiazem. Patient says that she started feeling dizzy and nauseous after her diltiazem dose was increased.  Patient thought this was secondary to her increased blood pressure so she took the dose of diltiazem. Patient also complained of heartburn and dysuria.  Upon evaluation in the ER patient was found to be bradycardic with heart rate down into 40s and the UA showed large leukocytes. Nephrology was consulted for acute kidney injury. Patient was seen today on second floor Patient offers no complaint of chest pain or shortness of breath No complaint of fever cough or chills No complaint of recent Covid contacts No complaint of lower extremity edema No complaint of malaise No complaint of change in energy No complaint of metallic taste I discussed patient kidney related issues at length.  Patient daughter was present in the room.  She helped me communicate with the patient  Past Medical History:  Diagnosis Date  . Chronic kidney disease    renal insufficiency  . Complication of anesthesia    Vital signs low during colonoscopy  . Elevated lipids   . GERD (gastroesophageal reflux disease)   . Hypertension         Family History  Problem Relation Age of Onset  . Breast cancer Sister 15    Social History:   reports that she has never smoked. She has never used smokeless tobacco. She reports current alcohol use. She reports that she does not use drugs.   Allergies:  Allergies  Allergen Reactions  . Amlodipine     Other reaction(s): Unknown  . Azithromycin     Other reaction(s): Unknown    Medications Prior to Admission  Medication Sig Dispense Refill  . acetaminophen (TYLENOL) 500 MG tablet Take 500 mg by mouth as needed.    . meclizine (ANTIVERT) 25 MG tablet Take 25 mg by mouth 3 (three) times daily as needed for dizziness. Reported on 10/05/2015    . omeprazole (PRILOSEC) 40 MG capsule Take 40 mg by mouth daily as needed.     Marland Kitchen atorvastatin (LIPITOR) 80 MG tablet 80 mg.    . cefUROXime (CEFTIN) 250 MG tablet Take 1 tablet (250 mg total) by mouth 2 (two) times daily with a meal. (Patient not taking: Reported on 02/19/2020) 10 tablet 0  . colchicine 0.6 MG tablet 0.6 mg. Reported on 01/15/2016    . dextromethorphan-guaiFENesin (MUCINEX DM) 30-600 MG 12hr tablet Take 1 tablet by mouth 2 (two) times daily. (Patient not taking: Reported on 02/19/2020) 10 tablet 0  . diltiazem (CARDIZEM CD) 300 MG 24 hr capsule 240 mg.    . diltiazem (TIAZAC) 180 MG 24 hr capsule Take 1 capsule by mouth in the morning and at bedtime.    . DOCOSAHEXAENOIC ACID PO Take by mouth.    . famotidine (PEPCID) 20 MG tablet Take 1 tablet (20  mg total) by mouth 2 (two) times daily. (Patient not taking: Reported on 02/19/2020) 60 tablet 0  . lisinopril (PRINIVIL,ZESTRIL) 10 MG tablet Take by mouth.    . ondansetron (ZOFRAN ODT) 4 MG disintegrating tablet Take 1 tablet (4 mg total) by mouth every 8 (eight) hours as needed for nausea or vomiting. (Patient not taking: Reported on 02/19/2020) 20 tablet 0  . pantoprazole (PROTONIX) 40 MG tablet Take by mouth.    . polyethylene glycol powder (GLYCOLAX/MIRALAX) powder Take by mouth.    . sucralfate (CARAFATE) 1 g tablet Take 1 tablet (1 g total) by mouth 4 (four) times daily. (Patient  not taking: Reported on 02/19/2020) 120 tablet 1  . tiZANidine (ZANAFLEX) 4 MG tablet Take 4 mg by mouth at bedtime.    . traMADol (ULTRAM) 50 MG tablet Take by mouth.         [id number] from the symptoms mentioned above,there are no other symptoms referable to all systems reviewed.  Marland Kitchen atorvastatin  80 mg Oral Daily  . heparin  5,000 Units Subcutaneous Q8H  . pantoprazole  40 mg Oral Daily  . sodium zirconium cyclosilicate  10 g Oral Once       Physical Exam: Vital signs in last 24 hours: Temp:  [98.2 F (36.8 C)-98.4 F (36.9 C)] 98.4 F (36.9 C) (05/23 0545) Pulse Rate:  [44-120] 77 (05/23 0545) Resp:  [14-18] 14 (05/23 0545) BP: (136-174)/(42-131) 156/51 (05/23 0545) SpO2:  [91 %-100 %] 99 % (05/23 0545) Weight:  [74.3 kg] 74.3 kg (05/23 0025) Weight change:  Last BM Date: (No diarrhea/BM this morning)  Intake/Output from previous day: No intake/output data recorded. No intake/output data recorded.   Physical Exam: General- pt is awake,alert, oriented to time place and person Resp- No acute REsp distress, CTA B/L NO Rhonchi CVS- S1S2 regular in rate and rhythm GIT- BS+, soft, NT, ND EXT- NO LE Edema, Cyanosis CNS- CN 2-12 grossly intact. Moving all 4 extremities Psych- normal mood and affect    Lab Results: CBC Recent Labs    02/19/20 1810 02/20/20 0602  WBC 12.6* 7.9  HGB 9.0* 8.1*  HCT 27.1* 24.4*  PLT 231 199    BMET Recent Labs    02/19/20 1810 02/20/20 0602  NA 133* 137  K 6.1* 5.5*  CL 103 107  CO2 18* 22  GLUCOSE 170* 106*  BUN 75* 75*  CREATININE 5.33* 5.50*  CALCIUM 7.7* 8.1*     Creatinine trend 2021 5.3--5.5 2020 3.3 --4.1 2019 2.4--3.0 2018 2.6--3.9-AKI 2017 2.3  MICRO Recent Results (from the past 240 hour(s))  SARS Coronavirus 2 by RT PCR (hospital order, performed in [facility] hospital lab) Nasopharyngeal Nasopharyngeal Swab     Status: None   Collection Time: 02/19/20  9:27 PM   Specimen: Nasopharyngeal Swab   Result Value Ref Range Status   SARS Coronavirus 2 NEGATIVE NEGATIVE Final    Comment: (NOTE) SARS-CoV-2 target nucleic acids are NOT DETECTED. The SARS-CoV-2 RNA is generally detectable in upper and lower respiratory specimens during the acute phase of infection. The lowest concentration of SARS-CoV-2 viral copies this assay can detect is 250 copies / mL. A negative result does not preclude SARS-CoV-2 infection and should not be used as the sole basis for treatment or other patient management decisions.  A negative result may occur with improper specimen collection / handling, submission of specimen other than nasopharyngeal swab, presence of viral mutation(s) within the areas targeted by this assay, and inadequate number of  viral copies (<250 copies / mL). A negative result must be combined with clinical observations, patient history, and epidemiological information. Fact Sheet for Patients:   StrictlyIdeas.no Fact Sheet for Healthcare Providers: BankingDealers.co.za This test is not yet approved or cleared  by the [place] FDA and has been authorized for detection and/or diagnosis of SARS-CoV-2 by FDA under an Emergency Use Authorization (EUA).  This EUA will remain in effect (meaning this test can be used) for the duration of the COVID-19 declaration under Section 564(b)(1) of the Act, 21 U.S.C. section 360bbb-3(b)(1), unless the authorization is terminated or revoked sooner. Performed at [facility], [address] [place], [place] [address]       Lab Results  Component Value Date   CALCIUM 8.1 (L) 02/20/2020      Impression: 1)Renal  AKI secondary to ATN AKI secondary to hypotension and ACE on board AKI on CKD versus CKD progression Patient has CKD stage IV. Patient has CKD secondary to hypertension Possible contribution from age associated decline/unresolved ATN Patient CKD has been marked with  episodes of AKI. Patient CKD stage  4 going back to 2017. Patient follows up with [facility]  Educated patient about the current progression of her CKD and possible need for renal placement therapy  2)HTN Blood pressure is at goal for the acute state   3)Anemia of chronic disease  HGb is not at goal (9--11) We will initiate anemia work-up  4) secondary hyperparathyroidism CKD Mineral-Bone Disorder Patient has history of secondary hyperparathyroidism with PTH being above 170 going back 2017 We will check phosphorus levels  5) bradycardia Secondary to diltiazem Now better  6) electrolytes Hyperkalemia Now better   NOrmonatremic  7)Acid base Patient had non-anion gap acidosis Co2 now at goal     Plan:  Will initiate anemia work-up We will ask for phosphorus levels Agree with the current IV fluids Agree with stopping lisinopril We will ask for strict INO's We will ask for bladder scan to rule out possible causes as well We will ask for renal ultrasound No need for renal replacement therapy today I did educate patient about possible need elucidation and possible need for access placement as an outpatient patient and daughter voiced understanding      Manpreet s Theador Hawthorne 02/20/2020, 10:45 AM

## 2020-02-20 NOTE — Progress Notes (Signed)
PROGRESS NOTE    Leah Davies  XIP:[id number] DOB: 03-02-1941 DOA: 02/19/2020 PCP: Freddy Finner, NP      Assessment & Plan:   Active Problems:   Acute kidney insufficiency   Intractable vomiting with nausea   CKD stage 4 secondary to hypertension (HCC)   Hyperkalemia   Acute lower UTI   Essential hypertension   GERD (gastroesophageal reflux disease)   Anemia of chronic disease   Hypocalcemia   Bradycardia: secondary to diltiazem use. Hold diltiazem for now. Continue on tele  AKI on CKD IV: baseline Cr is unknown possibly around 2.5/2.6 as per EMR review. Continue on IVFs. Avoid nephrotoxic meds. Nephro consulted  Hyperkalemia: s/p insulin and D50, IV calcium gluconate and bicarb 11meQ given in the ED. Will give lokelma x 1   UTI: continue on IV rocephin. Urine culture pending.  Hypertension: holding diltiazem. Unable to tolerate lisinopril due to hyperkalemia or amlodipine due to lower extremity edema in the past. Started on hydralazine and will see how pt tolerates it   Hypocalcemia: due to CKD. S/p Ca gluconate given  GERD: likely cause of her epigastric pain.  Lipase was slightly elevated but had negative CT abdomen and pelvis scan. Continue PPI  Anemia of chronic disease: stable. No need for a transfusion at this time. Will continue to monitor    DVT prophylaxis: heparin  Code Status: full  Family Communication: discussed pt's care w/ pt's family who is at bedside and answered her questions  Disposition Plan: depends on PT/OT recs  Consultants:      Procedures:    Antimicrobials:   Subjective: Pt c/o fatigue   Objective: Vitals:   02/19/20 2350 02/20/20 0017 02/20/20 0025 02/20/20 0545  BP:  (!) 174/87  (!) 156/51  Pulse: (!) 50 (!) 120  77  Resp: 14   14  Temp:    98.4 F (36.9 C)  TempSrc:    Oral  SpO2: 96% 98%  99%  Weight:   74.3 kg   Height:   4\' 11"  (1.499 m)    No intake or output data in the 24 hours ending 02/20/20  0746 Filed Weights   02/20/20 0025  Weight: 74.3 kg    Examination:  General exam: Appears calm and comfortable  Respiratory system: Clear to auscultation. Respiratory effort normal. Cardiovascular system: S1 & S2 +. No rubs, gallops or clicks.  Gastrointestinal system: Abdomen is nondistended, soft and nontender. Normal bowel sounds heard. Central nervous system: Alert and oriented. Moves all 4 extremities  Psychiatry: Judgement and insight appear normal. Flat mood and affect.     Data Reviewed: I have personally reviewed following labs and imaging studies  CBC: Recent Labs  Lab 02/19/20 1810 02/20/20 0602  WBC 12.6* 7.9  HGB 9.0* 8.1*  HCT 27.1* 24.4*  MCV 85.8 85.3  PLT 231 673   Basic Metabolic Panel: Recent Labs  Lab 02/19/20 1810 02/20/20 0602  NA 133* 137  K 6.1* 5.5*  CL 103 107  CO2 18* 22  GLUCOSE 170* 106*  BUN 75* 75*  CREATININE 5.33* 5.50*  CALCIUM 7.7* 8.1*   GFR: Estimated Creatinine Clearance: 7.5 mL/min (A) (by C-G formula based on SCr of 5.5 mg/dL (H)). Liver Function Tests: Recent Labs  Lab 02/19/20 1810  AST 21  ALT 16  ALKPHOS 101  BILITOT 0.7  PROT 7.2  ALBUMIN 4.0   Recent Labs  Lab 02/19/20 1810  LIPASE 67*   No results for input(s): AMMONIA in the last  168 hours. Coagulation Profile: No results for input(s): INR, PROTIME in the last 168 hours. Cardiac Enzymes: No results for input(s): CKTOTAL, CKMB, CKMBINDEX, TROPONINI in the last 168 hours. BNP (last 3 results) No results for input(s): PROBNP in the last 8760 hours. HbA1C: No results for input(s): HGBA1C in the last 72 hours. CBG: Recent Labs  Lab 02/19/20 2258  GLUCAP 156*   Lipid Profile: No results for input(s): CHOL, HDL, LDLCALC, TRIG, CHOLHDL, LDLDIRECT in the last 72 hours. Thyroid Function Tests: No results for input(s): TSH, T4TOTAL, FREET4, T3FREE, THYROIDAB in the last 72 hours. Anemia Panel: No results for input(s): VITAMINB12, FOLATE, FERRITIN,  TIBC, IRON, RETICCTPCT in the last 72 hours. Sepsis Labs: No results for input(s): PROCALCITON, LATICACIDVEN in the last 168 hours.  Recent Results (from the past 240 hour(s))  SARS Coronavirus 2 by RT PCR (hospital order, performed in [facility] hospital lab) Nasopharyngeal Nasopharyngeal Swab     Status: None   Collection Time: 02/19/20  9:27 PM   Specimen: Nasopharyngeal Swab  Result Value Ref Range Status   SARS Coronavirus 2 NEGATIVE NEGATIVE Final    Comment: (NOTE) SARS-CoV-2 target nucleic acids are NOT DETECTED. The SARS-CoV-2 RNA is generally detectable in upper and lower respiratory specimens during the acute phase of infection. The lowest concentration of SARS-CoV-2 viral copies this assay can detect is 250 copies / mL. A negative result does not preclude SARS-CoV-2 infection and should not be used as the sole basis for treatment or other patient management decisions.  A negative result may occur with improper specimen collection / handling, submission of specimen other than nasopharyngeal swab, presence of viral mutation(s) within the areas targeted by this assay, and inadequate number of viral copies (<250 copies / mL). A negative result must be combined with clinical observations, patient history, and epidemiological information. Fact Sheet for Patients:   StrictlyIdeas.no Fact Sheet for Healthcare Providers: BankingDealers.co.za This test is not yet approved or cleared  by the [place] FDA and has been authorized for detection and/or diagnosis of SARS-CoV-2 by FDA under an Emergency Use Authorization (EUA).  This EUA will remain in effect (meaning this test can be used) for the duration of the COVID-19 declaration under Section 564(b)(1) of the Act, 21 U.S.C. section 360bbb-3(b)(1), unless the authorization is terminated or revoked sooner. Performed at [facility], [address], [place], [place]  [address]          Radiology Studies: CT ABDOMEN PELVIS WO CONTRAST  Result Date: 02/19/2020 CLINICAL DATA:  Nausea and vomiting. EXAM: CT ABDOMEN AND PELVIS WITHOUT CONTRAST TECHNIQUE: Multidetector CT imaging of the abdomen and pelvis was performed following the standard protocol without IV contrast. COMPARISON:  None. FINDINGS: Lower chest: The lung bases are clear. The heart size is normal. There are coronary artery calcifications. Hepatobiliary: The liver is normal. Normal gallbladder.There is no biliary ductal dilation. Pancreas: Normal contours without ductal dilatation. No peripancreatic fluid collection. Spleen: Unremarkable. Adrenals/Urinary Tract: --Adrenal glands: Unremarkable. --Right kidney/ureter: No hydronephrosis or radiopaque kidney stones. --Left kidney/ureter: No hydronephrosis or radiopaque kidney stones. --Urinary bladder: Unremarkable. Stomach/Bowel: --Stomach/Duodenum: No hiatal hernia or other gastric abnormality. Normal duodenal course and caliber. --Small bowel: Unremarkable. --Colon: Rectosigmoid diverticulosis without acute inflammation. --Appendix: Normal. Vascular/Lymphatic: Atherosclerotic calcification is present within the non-aneurysmal abdominal aorta, without hemodynamically significant stenosis. --No retroperitoneal lymphadenopathy. --No mesenteric lymphadenopathy. --No pelvic or inguinal lymphadenopathy. Reproductive: Unremarkable Other: No ascites or free air. There is a fat containing umbilical hernia. Musculoskeletal. No acute displaced  fractures. IMPRESSION: 1. No acute abdominopelvic abnormality. 2. Rectosigmoid diverticulosis without acute inflammation. Aortic Atherosclerosis (ICD10-I70.0). Electronically Signed   By: Constance Holster M.D.   On: 02/19/2020 20:30        Scheduled Meds: . atorvastatin  80 mg Oral Daily  . heparin  5,000 Units Subcutaneous Q8H  . pantoprazole  40 mg Oral Daily  . sodium chloride flush  3 mL Intravenous Once   Continuous  Infusions: . cefTRIAXone (ROCEPHIN)  IV Stopped (02/20/20 0001)     LOS: 1 day    Time spent: 31 mins     Wyvonnia Dusky, MD [facility] Pager [phone]  If 7PM-7AM, please contact night-coverage [email or web address] 02/20/2020, 7:46 AM

## 2020-02-21 LAB — BASIC METABOLIC PANEL
Anion gap: 10 (ref 5–15)
BUN: 57 mg/dL — ABNORMAL HIGH (ref 8–23)
CO2: 18 mmol/L — ABNORMAL LOW (ref 22–32)
Calcium: 8 mg/dL — ABNORMAL LOW (ref 8.9–10.3)
Chloride: 111 mmol/L (ref 98–111)
Creatinine, Ser: 5.06 mg/dL — ABNORMAL HIGH (ref 0.44–1.00)
GFR calc Af Amer: 9 mL/min — ABNORMAL LOW (ref >60)
GFR calc non Af Amer: 8 mL/min — ABNORMAL LOW (ref >60)
Glucose, Bld: 95 mg/dL (ref 70–99)
Potassium: 4.6 mmol/L (ref 3.5–5.1)
Sodium: 139 mmol/L (ref 135–145)

## 2020-02-21 LAB — CBC
HCT: 27.7 % — ABNORMAL LOW (ref 36.0–46.0)
Hemoglobin: 9 g/dL — ABNORMAL LOW (ref 12.0–15.0)
MCH: 28.6 pg (ref 26.0–34.0)
MCHC: 32.5 g/dL (ref 30.0–36.0)
MCV: 87.9 fL (ref 80.0–100.0)
Platelets: 201 10*3/uL (ref 150–400)
RBC: 3.15 MIL/uL — ABNORMAL LOW (ref 3.87–5.11)
RDW: 13.2 % (ref 11.5–15.5)
WBC: 8 10*3/uL (ref 4.0–10.5)
nRBC: 0 % (ref 0.0–0.2)

## 2020-02-21 LAB — MAGNESIUM: Magnesium: 2.3 mg/dL (ref 1.7–2.4)

## 2020-02-21 MED ORDER — HYDRALAZINE HCL 20 MG/ML IJ SOLN
10.0000 mg | Freq: Once | INTRAMUSCULAR | Status: AC
  Administered 2020-02-21: 10 mg via INTRAVENOUS
  Filled 2020-02-21: qty 1

## 2020-02-21 MED ORDER — DILTIAZEM HCL ER COATED BEADS 180 MG PO CP24
180.0000 mg | ORAL_CAPSULE | Freq: Every day | ORAL | Status: DC
  Administered 2020-02-21 – 2020-02-22 (×2): 180 mg via ORAL
  Filled 2020-02-21 (×2): qty 1

## 2020-02-21 MED ORDER — LABETALOL HCL 5 MG/ML IV SOLN
10.0000 mg | Freq: Once | INTRAVENOUS | Status: AC
  Administered 2020-02-21: 10 mg via INTRAVENOUS
  Filled 2020-02-21: qty 4

## 2020-02-21 MED ORDER — ISOSORBIDE MONONITRATE ER 30 MG PO TB24
30.0000 mg | ORAL_TABLET | Freq: Every day | ORAL | Status: DC
  Administered 2020-02-21 – 2020-02-23 (×3): 30 mg via ORAL
  Filled 2020-02-21 (×3): qty 1

## 2020-02-21 MED ORDER — HYDRALAZINE HCL 50 MG PO TABS
100.0000 mg | ORAL_TABLET | Freq: Three times a day (TID) | ORAL | Status: DC
  Administered 2020-02-21 – 2020-02-23 (×7): 100 mg via ORAL
  Filled 2020-02-21 (×7): qty 2

## 2020-02-21 NOTE — Progress Notes (Signed)
PROGRESS NOTE    Leah Davies  ZGY:[id number] DOB: 08-08-1941 DOA: 02/19/2020 PCP: Freddy Finner, NP      Assessment & Plan:   Active Problems:   Acute kidney insufficiency   Intractable vomiting with nausea   CKD stage 4 secondary to hypertension (HCC)   Hyperkalemia   Acute lower UTI   Essential hypertension   GERD (gastroesophageal reflux disease)   Anemia of chronic disease   Hypocalcemia   AKI on CKD IV: baseline Cr is unknown possibly around 2.5/2.6 as per EMR review. Continue on IVFs. Cr is trending down today. Avoid nephrotoxic meds. Renal US is consistent w/ medical renal disease & 2 cysts, 1 in each kidney. Nephro following and recs apprec.   Hyperkalemia: s/p insulin and D50, IV calcium gluconate and bicarb 46meQ given in the ED. S/p lokelma x 1. Resolved  UTI: continue on IV rocephin. Urine culture growing e. coli, sens pending  Hypertension: continue diltiazem at decreased dose, hydralazine & started on imdur. Unable to tolerate lisinopril due to hyperkalemia or amlodipine due to lower extremity edema in the past.   Bradycardia: resolved  Hypocalcemia: due to CKD. S/p Ca gluconate given  GERD: likely cause of her epigastric pain.  Lipase was slightly elevated but had negative CT abdomen and pelvis scan. Continue PPI  Anemia of chronic disease: stable. No need for a transfusion at this time. Will continue to monitor    DVT prophylaxis: heparin  Code Status: full  Family Communication: discussed pt's care w/ pt's family who is at bedside and answered her questions  Disposition Plan: depends on PT/OT recs  Status is: Inpatient  Remains inpatient appropriate because:Persistent severe electrolyte disturbances and IV treatments appropriate due to intensity of illness or inability to take PO   Dispo: The patient is from: Home              Anticipated d/c is to: Home vs home health vs SNF              Anticipated d/c date is: 2 days  Patient currently is not medically stable to d/c.         Consultants:      Procedures:    Antimicrobials:   Subjective: Pt c/o malaise    Objective: Vitals:   02/21/20 0216 02/21/20 0351 02/21/20 0414 02/21/20 0521  BP: (!) 184/55 (!) 195/60 (!) 180/56 (!) 148/52  Pulse: 82 83 81 71  Resp:  18 16 19   Temp:   98.2 F (36.8 C) 97.9 F (36.6 C)  TempSrc:   Oral Oral  SpO2:  98% 98% 97%  Weight:    74.7 kg  Height:        Intake/Output Summary (Last 24 hours) at 02/21/2020 0747 Last data filed at 02/20/2020 1956 Gross per 24 hour  Intake 753.83 ml  Output 700 ml  Net 53.83 ml   Filed Weights   02/20/20 0025 02/21/20 0521  Weight: 74.3 kg 74.7 kg    Examination:  General exam: Appears calm and comfortable  Respiratory system: Clear to auscultation. Respiratory effort normal. Cardiovascular system: S1 & S2 +. No rubs, gallops or clicks.  Gastrointestinal system: Abdomen is nondistended, soft and nontender. Hypoactive bowel sounds heard. Central nervous system: Alert and oriented. Moves all 4 extremities  Psychiatry: Judgement and insight appear normal. Flat mood and affect.     Data Reviewed: I have personally reviewed following labs and imaging studies  CBC: Recent Labs  Lab 02/19/20 1810 02/20/20 0602  02/21/20 0309  WBC 12.6* 7.9 8.0  HGB 9.0* 8.1* 9.0*  HCT 27.1* 24.4* 27.7*  MCV 85.8 85.3 87.9  PLT 231 199 767   Basic Metabolic Panel: Recent Labs  Lab 02/19/20 1810 02/20/20 0602 02/21/20 0309  NA 133* 137 139  K 6.1* 5.5* 4.6  CL 103 107 111  CO2 18* 22 18*  GLUCOSE 170* 106* 95  BUN 75* 75* 57*  CREATININE 5.33* 5.50* 5.06*  CALCIUM 7.7* 8.1* 8.0*  MG  --   --  2.3  PHOS  --  5.3*  --    GFR: Estimated Creatinine Clearance: 8.2 mL/min (A) (by C-G formula based on SCr of 5.06 mg/dL (H)). Liver Function Tests: Recent Labs  Lab 02/19/20 1810  AST 21  ALT 16  ALKPHOS 101  BILITOT 0.7  PROT 7.2  ALBUMIN 4.0   Recent  Labs  Lab 02/19/20 1810  LIPASE 67*   No results for input(s): AMMONIA in the last 168 hours. Coagulation Profile: No results for input(s): INR, PROTIME in the last 168 hours. Cardiac Enzymes: No results for input(s): CKTOTAL, CKMB, CKMBINDEX, TROPONINI in the last 168 hours. BNP (last 3 results) No results for input(s): PROBNP in the last 8760 hours. HbA1C: No results for input(s): HGBA1C in the last 72 hours. CBG: Recent Labs  Lab 02/19/20 2258  GLUCAP 156*   Lipid Profile: No results for input(s): CHOL, HDL, LDLCALC, TRIG, CHOLHDL, LDLDIRECT in the last 72 hours. Thyroid Function Tests: No results for input(s): TSH, T4TOTAL, FREET4, T3FREE, THYROIDAB in the last 72 hours. Anemia Panel: Recent Labs    02/20/20 0602  VITAMINB12 300  FOLATE 10.4  FERRITIN 90  TIBC 210*  IRON 67  RETICCTPCT 1.7   Sepsis Labs: No results for input(s): PROCALCITON, LATICACIDVEN in the last 168 hours.  Recent Results (from the past 240 hour(s))  Urine culture     Status: Abnormal (Preliminary result)   Collection Time: 02/19/20  8:38 PM   Specimen: Urine, Random  Result Value Ref Range Status   Specimen Description   Final    URINE, RANDOM Performed at [facility], [address], [place], [place] [address]    Special Requests   Final    NONE Performed at [facility], [address] [place], [place] [address]    Culture (A)  Final    >=100,000 COLONIES/mL Lonell Grandchild NEGATIVE RODS SUSCEPTIBILITIES TO FOLLOW Performed at [facility], [place] [address] [place], [place] [address]    Report Status PENDING  Incomplete  SARS Coronavirus 2 by RT PCR (hospital order, performed in [facility] hospital lab) Nasopharyngeal Nasopharyngeal Swab     Status: None   Collection Time: 02/19/20  9:27 PM   Specimen: Nasopharyngeal Swab  Result Value Ref Range Status   SARS Coronavirus 2 NEGATIVE NEGATIVE Final    Comment: (NOTE) SARS-CoV-2 target nucleic acids are NOT  DETECTED. The SARS-CoV-2 RNA is generally detectable in upper and lower respiratory specimens during the acute phase of infection. The lowest concentration of SARS-CoV-2 viral copies this assay can detect is 250 copies / mL. A negative result does not preclude SARS-CoV-2 infection and should not be used as the sole basis for treatment or other patient management decisions.  A negative result may occur with improper specimen collection / handling, submission of specimen other than nasopharyngeal swab, presence of viral mutation(s) within the areas targeted by this assay, and inadequate number of viral copies (<250 copies / mL). A negative  result must be combined with clinical observations, patient history, and epidemiological information. Fact Sheet for Patients:   StrictlyIdeas.no Fact Sheet for Healthcare Providers: BankingDealers.co.za This test is not yet approved or cleared  by the [place] FDA and has been authorized for detection and/or diagnosis of SARS-CoV-2 by FDA under an Emergency Use Authorization (EUA).  This EUA will remain in effect (meaning this test can be used) for the duration of the COVID-19 declaration under Section 564(b)(1) of the Act, 21 U.S.C. section 360bbb-3(b)(1), unless the authorization is terminated or revoked sooner. Performed at [facility], [address], [place], [place] [address]          Radiology Studies: CT ABDOMEN PELVIS WO CONTRAST  Result Date: 02/19/2020 CLINICAL DATA:  Nausea and vomiting. EXAM: CT ABDOMEN AND PELVIS WITHOUT CONTRAST TECHNIQUE: Multidetector CT imaging of the abdomen and pelvis was performed following the standard protocol without IV contrast. COMPARISON:  None. FINDINGS: Lower chest: The lung bases are clear. The heart size is normal. There are coronary artery calcifications. Hepatobiliary: The liver is normal. Normal gallbladder.There is no biliary ductal  dilation. Pancreas: Normal contours without ductal dilatation. No peripancreatic fluid collection. Spleen: Unremarkable. Adrenals/Urinary Tract: --Adrenal glands: Unremarkable. --Right kidney/ureter: No hydronephrosis or radiopaque kidney stones. --Left kidney/ureter: No hydronephrosis or radiopaque kidney stones. --Urinary bladder: Unremarkable. Stomach/Bowel: --Stomach/Duodenum: No hiatal hernia or other gastric abnormality. Normal duodenal course and caliber. --Small bowel: Unremarkable. --Colon: Rectosigmoid diverticulosis without acute inflammation. --Appendix: Normal. Vascular/Lymphatic: Atherosclerotic calcification is present within the non-aneurysmal abdominal aorta, without hemodynamically significant stenosis. --No retroperitoneal lymphadenopathy. --No mesenteric lymphadenopathy. --No pelvic or inguinal lymphadenopathy. Reproductive: Unremarkable Other: No ascites or free air. There is a fat containing umbilical hernia. Musculoskeletal. No acute displaced fractures. IMPRESSION: 1. No acute abdominopelvic abnormality. 2. Rectosigmoid diverticulosis without acute inflammation. Aortic Atherosclerosis (ICD10-I70.0). Electronically Signed   By: Constance Holster M.D.   On: 02/19/2020 20:30   US RENAL  Result Date: 02/20/2020 CLINICAL DATA:  ATN. EXAM: RENAL / URINARY TRACT ULTRASOUND COMPLETE COMPARISON:  None. FINDINGS: Right Kidney: Renal measurements: 10.1 x 5.5 x 5.2 cm = volume: 152 mL. Diffusely increased cortical echogenicity. There is a 2.2 x 2.1 x 2.1 cm complex cystic mass in the upper pole of the right kidney. No evidence of hydronephrosis. Left Kidney: Renal measurements: 8.8 x 5.9 x 4.1 cm = volume: 125 mL. Diffusely increased cortical echogenicity. There is a 1.2 x 0.9 x 1.1 cm benign-appearing cyst in the lower pole of the left kidney. No evidence of hydronephrosis. Bladder: Appears normal for degree of bladder distention. Other: None. IMPRESSION: Bilateral echogenic kidneys, consistent  with medical renal disease. 2.2 cm complicated benign appearing cyst in the upper pole of the right kidney. Benign-appearing cyst in the lower pole of the left kidney measures 1.2 cm. Electronically Signed   By: Fidela Salisbury M.D.   On: 02/20/2020 15:08        Scheduled Meds: . atorvastatin  80 mg Oral Daily  . heparin  5,000 Units Subcutaneous Q8H  . hydrALAZINE  50 mg Oral Q8H  . pantoprazole  40 mg Oral Daily   Continuous Infusions: . sodium chloride 75 mL/hr at 02/21/20 0126  . cefTRIAXone (ROCEPHIN)  IV 1 g (02/20/20 2128)     LOS: 2 days    Time spent: 30 mins     Wyvonnia Dusky, MD [facility] Pager [phone]  If 7PM-7AM, please contact night-coverage [email or web address] 02/21/2020, 7:47 AM

## 2020-02-21 NOTE — Progress Notes (Signed)
PT Cancellation Note  Patient Details Name: Leah Davies MRN: [id number] DOB: October 13, 1940   Cancelled Treatment:    Reason Eval/Treat Not Completed: Medical issues which prohibited therapy(Chart reviewed- most recent BP: 189/25mmHg. Will defer PT evaluation until later date/time once pt is medically appropriate.)  10:29 AM, 02/21/20 [name], PT, DPT Physical Therapist - [facility]  [phone] ([place])    [place] 02/21/2020, 10:29 AM

## 2020-02-21 NOTE — Progress Notes (Signed)
[place], [place] 02/21/20  Subjective:   Hospital day # 2  Conversation through [place] interpreter.  Daughter in the room Overall she is feeling well.  Able to eat half of her lunch today without nausea or vomiting. No shortness of breath No leg edema Serum creatinine remains critically elevated but stable Renal: 05/23 0701 - 05/24 0700 In: 753.8 [P.O.:360; I.V.:393.8] Out: 700 [Urine:700] Lab Results  Component Value Date   CREATININE 5.06 (H) 02/21/2020   CREATININE 5.50 (H) 02/20/2020   CREATININE 5.33 (H) 02/19/2020     Objective:  Vital signs in last 24 hours:  Temp:  [97.9 F (36.6 C)-98.4 F (36.9 C)] 98.4 F (36.9 C) (05/24 1152) Pulse Rate:  [71-83] 72 (05/24 1200) Resp:  [16-19] 18 (05/24 1152) BP: (148-207)/(52-157) 187/58 (05/24 1200) SpO2:  [97 %-99 %] 99 % (05/24 1152) Weight:  [74.7 kg] 74.7 kg (05/24 0521)  Weight change: 0.395 kg Filed Weights   02/20/20 0025 02/21/20 0521  Weight: 74.3 kg 74.7 kg    Intake/Output:    Intake/Output Summary (Last 24 hours) at 02/21/2020 1235 Last data filed at 02/21/2020 0935 Gross per 24 hour  Intake 1263.83 ml  Output 700 ml  Net 563.83 ml    Physical Exam: General:  No acute distress, laying in the bed  HEENT  anicteric, moist oral mucous membrane  Pulm/lungs  normal breathing effort, lungs are clear to auscultation  CVS/Heart  regular rhythm, no rub or gallop  Abdomen:   Soft, nontender  Extremities:  No peripheral edema  Neurologic:  Alert, oriented, able to follow commands  Skin:  No acute rashes     Basic Metabolic Panel:  Recent Labs  Lab 02/19/20 1810 02/20/20 0602 02/21/20 0309  NA 133* 137 139  K 6.1* 5.5* 4.6  CL 103 107 111  CO2 18* 22 18*  GLUCOSE 170* 106* 95  BUN 75* 75* 57*  CREATININE 5.33* 5.50* 5.06*  CALCIUM 7.7* 8.1* 8.0*  MG  --   --  2.3  PHOS  --  5.3*  --      CBC: Recent Labs  Lab 02/19/20 1810 02/20/20 0602 02/21/20 0309   WBC 12.6* 7.9 8.0  HGB 9.0* 8.1* 9.0*  HCT 27.1* 24.4* 27.7*  MCV 85.8 85.3 87.9  PLT 231 199 201     No results found for: HEPBSAG, HEPBSAB, HEPBIGM    Microbiology:  Recent Results (from the past 240 hour(s))  Urine culture     Status: Abnormal (Preliminary result)   Collection Time: 02/19/20  8:38 PM   Specimen: Urine, Random  Result Value Ref Range Status   Specimen Description   Final    URINE, RANDOM Performed at [facility], [address] [place], [place] [address]    Special Requests   Final    NONE Performed at [facility], [address], [place], [place] [address]    Culture (A)  Final    >=100,000 COLONIES/mL ESCHERICHIA COLI SUSCEPTIBILITIES TO FOLLOW Performed at [facility], [address] [place], [place] [address]    Report Status PENDING  Incomplete  SARS Coronavirus 2 by RT PCR (hospital order, performed in [facility] hospital lab) Nasopharyngeal Nasopharyngeal Swab     Status: None   Collection Time: 02/19/20  9:27 PM   Specimen: Nasopharyngeal Swab  Result Value Ref Range Status   SARS Coronavirus 2 NEGATIVE NEGATIVE Final    Comment: (NOTE) SARS-CoV-2 target nucleic acids are NOT  DETECTED. The SARS-CoV-2 RNA is generally detectable in upper and lower respiratory specimens during the acute phase of infection. The lowest concentration of SARS-CoV-2 viral copies this assay can detect is 250 copies / mL. A negative result does not preclude SARS-CoV-2 infection and should not be used as the sole basis for treatment or other patient management decisions.  A negative result may occur with improper specimen collection / handling, submission of specimen other than nasopharyngeal swab, presence of viral mutation(s) within the areas targeted by this assay, and inadequate number of viral copies (<250 copies / mL). A negative result must be combined with clinical observations, patient history, and epidemiological  information. Fact Sheet for Patients:   StrictlyIdeas.no Fact Sheet for Healthcare Providers: BankingDealers.co.za This test is not yet approved or cleared  by the [place] FDA and has been authorized for detection and/or diagnosis of SARS-CoV-2 by FDA under an Emergency Use Authorization (EUA).  This EUA will remain in effect (meaning this test can be used) for the duration of the COVID-19 declaration under Section 564(b)(1) of the Act, 21 U.S.C. section 360bbb-3(b)(1), unless the authorization is terminated or revoked sooner. Performed at [facility], [address] [place], [place] [address]     Coagulation Studies: No results for input(s): LABPROT, INR in the last 72 hours.  Urinalysis: Recent Labs    02/19/20 1809  COLORURINE YELLOW*  LABSPEC 1.014  PHURINE 5.0  GLUCOSEU NEGATIVE  HGBUR NEGATIVE  BILIRUBINUR NEGATIVE  KETONESUR NEGATIVE  PROTEINUR 100*  NITRITE NEGATIVE  LEUKOCYTESUR LARGE*      Imaging: CT ABDOMEN PELVIS WO CONTRAST  Result Date: 02/19/2020 CLINICAL DATA:  Nausea and vomiting. EXAM: CT ABDOMEN AND PELVIS WITHOUT CONTRAST TECHNIQUE: Multidetector CT imaging of the abdomen and pelvis was performed following the standard protocol without IV contrast. COMPARISON:  None. FINDINGS: Lower chest: The lung bases are clear. The heart size is normal. There are coronary artery calcifications. Hepatobiliary: The liver is normal. Normal gallbladder.There is no biliary ductal dilation. Pancreas: Normal contours without ductal dilatation. No peripancreatic fluid collection. Spleen: Unremarkable. Adrenals/Urinary Tract: --Adrenal glands: Unremarkable. --Right kidney/ureter: No hydronephrosis or radiopaque kidney stones. --Left kidney/ureter: No hydronephrosis or radiopaque kidney stones. --Urinary bladder: Unremarkable. Stomach/Bowel: --Stomach/Duodenum: No hiatal hernia or other gastric abnormality. Normal  duodenal course and caliber. --Small bowel: Unremarkable. --Colon: Rectosigmoid diverticulosis without acute inflammation. --Appendix: Normal. Vascular/Lymphatic: Atherosclerotic calcification is present within the non-aneurysmal abdominal aorta, without hemodynamically significant stenosis. --No retroperitoneal lymphadenopathy. --No mesenteric lymphadenopathy. --No pelvic or inguinal lymphadenopathy. Reproductive: Unremarkable Other: No ascites or free air. There is a fat containing umbilical hernia. Musculoskeletal. No acute displaced fractures. IMPRESSION: 1. No acute abdominopelvic abnormality. 2. Rectosigmoid diverticulosis without acute inflammation. Aortic Atherosclerosis (ICD10-I70.0). Electronically Signed   By: Constance Holster M.D.   On: 02/19/2020 20:30   US RENAL  Result Date: 02/20/2020 CLINICAL DATA:  ATN. EXAM: RENAL / URINARY TRACT ULTRASOUND COMPLETE COMPARISON:  None. FINDINGS: Right Kidney: Renal measurements: 10.1 x 5.5 x 5.2 cm = volume: 152 mL. Diffusely increased cortical echogenicity. There is a 2.2 x 2.1 x 2.1 cm complex cystic mass in the upper pole of the right kidney. No evidence of hydronephrosis. Left Kidney: Renal measurements: 8.8 x 5.9 x 4.1 cm = volume: 125 mL. Diffusely increased cortical echogenicity. There is a 1.2 x 0.9 x 1.1 cm benign-appearing cyst in the lower pole of the left kidney. No evidence of hydronephrosis. Bladder: Appears normal for degree of bladder distention. Other: None. IMPRESSION: Bilateral echogenic kidneys,  consistent with medical renal disease. 2.2 cm complicated benign appearing cyst in the upper pole of the right kidney. Benign-appearing cyst in the lower pole of the left kidney measures 1.2 cm. Electronically Signed   By: Fidela Salisbury M.D.   On: 02/20/2020 15:08     Medications:   . sodium chloride 75 mL/hr at 02/21/20 0126  . cefTRIAXone (ROCEPHIN)  IV 1 g (02/20/20 2128)   . atorvastatin  80 mg Oral Daily  . diltiazem  180 mg  Oral Daily  . heparin  5,000 Units Subcutaneous Q8H  . hydrALAZINE  100 mg Oral Q8H  . isosorbide mononitrate  30 mg Oral Daily  . pantoprazole  40 mg Oral Daily   acetaminophen, ondansetron (ZOFRAN) IV, ondansetron  Assessment/ Plan:  79 y.o. female with hypertension, chronic kidney disease stage IV, chronic venous insufficiency    admitted on 02/19/2020 for Hyperkalemia [E87.5] Weakness [R53.1] Acute kidney injury ([place]) [N17.9] Intractable vomiting with nausea [R11.2] Non-intractable vomiting with nausea, unspecified vomiting type [R11.2]  #Acute kidney injury on chronic kidney disease stage V  Last outpatient creatinine of 4.12/GFR 10 in December 2020 Patient was last seen at [facility] nephrology clinic in [place] in May 2021 Patient does not have an access at this time No overt uremic symptoms. No acute indication for dialysis She will follow-up with her nephrology team as outpatient  #Hypertension Patient has severe hypertension Blood pressure medications are being adjusted Current regimen includes diltiazem 180 mg daily Hydralazine 100 mg 3 times a day Imdur 30 mg daily    LOS: 2 Leah Davies 5/24/202112:35 PM  [place], [place]  Note: This note was prepared with Dragon dictation. Any transcription errors are unintentional

## 2020-02-21 NOTE — Progress Notes (Signed)
OT Cancellation Note  Patient Details Name: KEELYN MONJARAS MRN: [id number] DOB: 1941/01/09   Cancelled Treatment:    Reason Eval/Treat Not Completed: Medical issues which prohibited therapy. OT order received and chart reviewed. Most recent BP: 187/61mmHg. Will hold OT evaluation this date and initiate services as appropriate.   Dessie Coma, M.S. OTR/L  02/21/20, 1:56 PM

## 2020-02-22 LAB — URINE CULTURE: Culture: >=100000 — AB

## 2020-02-22 LAB — BASIC METABOLIC PANEL
Anion gap: 7 (ref 5–15)
BUN: 54 mg/dL — ABNORMAL HIGH (ref 8–23)
CO2: 21 mmol/L — ABNORMAL LOW (ref 22–32)
Calcium: 7.8 mg/dL — ABNORMAL LOW (ref 8.9–10.3)
Chloride: 113 mmol/L — ABNORMAL HIGH (ref 98–111)
Creatinine, Ser: 5.24 mg/dL — ABNORMAL HIGH (ref 0.44–1.00)
GFR calc Af Amer: 8 mL/min — ABNORMAL LOW (ref >60)
GFR calc non Af Amer: 7 mL/min — ABNORMAL LOW (ref >60)
Glucose, Bld: 95 mg/dL (ref 70–99)
Potassium: 4.7 mmol/L (ref 3.5–5.1)
Sodium: 141 mmol/L (ref 135–145)

## 2020-02-22 LAB — CBC
HCT: 25.4 % — ABNORMAL LOW (ref 36.0–46.0)
Hemoglobin: 8.1 g/dL — ABNORMAL LOW (ref 12.0–15.0)
MCH: 28.6 pg (ref 26.0–34.0)
MCHC: 31.9 g/dL (ref 30.0–36.0)
MCV: 89.8 fL (ref 80.0–100.0)
Platelets: 195 10*3/uL (ref 150–400)
RBC: 2.83 MIL/uL — ABNORMAL LOW (ref 3.87–5.11)
RDW: 13.2 % (ref 11.5–15.5)
WBC: 8.5 10*3/uL (ref 4.0–10.5)
nRBC: 0 % (ref 0.0–0.2)

## 2020-02-22 MED ORDER — HYDRALAZINE HCL 20 MG/ML IJ SOLN: 10.0000 mg | INTRAMUSCULAR | Status: DC | PRN

## 2020-02-22 MED ORDER — WITCH HAZEL-GLYCERIN EX PADS
MEDICATED_PAD | CUTANEOUS | Status: DC | PRN
  Filled 2020-02-22: qty 100

## 2020-02-22 MED ORDER — METOPROLOL SUCCINATE ER 50 MG PO TB24
50.0000 mg | ORAL_TABLET | Freq: Every day | ORAL | Status: DC
  Administered 2020-02-22 – 2020-02-23 (×2): 50 mg via ORAL
  Filled 2020-02-22 (×2): qty 1

## 2020-02-22 NOTE — Evaluation (Signed)
Physical Therapy Evaluation Patient Details Name: Leah Davies MRN: [id number] DOB: 1941-07-26 Today's Date: 02/22/2020   History of Present Illness  79 y.o. female with medical history significant for Hx of HTN, CKD stage IV and chronic venous insufficiency who presents with concerns of nausea, vomiting diarrhea and dizziness.  Clinical Impression  PT arrived to pt's room and (with assist from daughter) was finishing taking a shower.  She had no issues with balance, prolonged ambulation or strength and from a PT stand-point has no needs.  Daughter present and (per pt request) acting as interpreter t/o the session, states that she is at/near her baseline, feels good about going home (once medically cleared) and agrees that she does not require further PT intervention.  Will sign off and complete PT orders, no needs.    Follow Up Recommendations No PT follow up    Equipment Recommendations  None recommended by PT    Recommendations for Other Services       Precautions / Restrictions Precautions Precautions: None Restrictions Weight Bearing Restrictions: No      Mobility  Bed Mobility Overal bed mobility: Independent             General bed mobility comments: easily gets to EOB  Transfers Overall transfer level: Independent Equipment used: None                Ambulation/Gait Ambulation/Gait assistance: Modified independent (Device/Increase time) Gait Distance (Feet): 200 Feet Assistive device: None       General Gait Details: Pt able to ambulate with good confidence and safety, no fatigue or other issues  Stairs            Wheelchair Mobility    Modified Rankin (Stroke Patients Only)       Balance Overall balance assessment: Independent                                           Pertinent Vitals/Pain Pain Assessment: No/denies pain    Home Living Family/patient expects to be discharged to:: Private residence Living  Arrangements: Children;Other relatives Available Help at Discharge: Family;Available 24 hours/day   Home Access: Level entry     Home Layout: One level        Prior Function Level of Independence: Independent         Comments: Pt normally able to remain active and independent in the home     Hand Dominance        Extremity/Trunk Assessment   Upper Extremity Assessment Upper Extremity Assessment: Overall WFL for tasks assessed    Lower Extremity Assessment Lower Extremity Assessment: Overall WFL for tasks assessed       Communication   Communication: Prefers language other than English(requests to have daughter act as interp)  Cognition Arousal/Alertness: Awake/alert Behavior During Therapy: WFL for tasks assessed/performed Overall Cognitive Status: Within Functional Limits for tasks assessed                                        General Comments      Exercises     Assessment/Plan    PT Assessment Patent does not need any further PT services  PT Problem List         PT Treatment Interventions      PT Goals (  Current goals can be found in the Care Plan section)  Acute Rehab PT Goals Patient Stated Goal: go home PT Goal Formulation: All assessment and education complete, DC therapy    Frequency     Barriers to discharge        Co-evaluation               AM-PAC PT "6 Clicks" Mobility  Outcome Measure Help needed turning from your back to your side while in a flat bed without using bedrails?: None Help needed moving from lying on your back to sitting on the side of a flat bed without using bedrails?: None Help needed moving to and from a bed to a chair (including a wheelchair)?: None Help needed standing up from a chair using your arms (e.g., wheelchair or bedside chair)?: None Help needed to walk in hospital room?: None Help needed climbing 3-5 steps with a railing? : None 6 Click Score: 24    End of Session Equipment  Utilized During Treatment: Gait belt Activity Tolerance: Patient tolerated treatment well Patient left: in chair;with family/visitor present        Time: 1052-1110 PT Time Calculation (min) (ACUTE ONLY): 18 min   Charges:   PT Evaluation $PT Eval Low Complexity: 1 Low          Kreg Shropshire, DPT 02/22/2020, 12:37 PM

## 2020-02-22 NOTE — Progress Notes (Signed)
OT Cancellation Note  Patient Details Name: Leah Davies MRN: [id number] DOB: 04-05-1941   Cancelled Treatment:    Reason Eval/Treat Not Completed: OT screened, no needs identified, will sign off. OT received and chart reviewed order. Per conversation with PT, pt at baseline for ADL. No skilled acute OT needs identified. Will sign off, please re-consult if needs arise. Thanks!   Jerilynn Birkenhead, OTS 02/22/20, 11:36 AM

## 2020-02-22 NOTE — Progress Notes (Addendum)
PROGRESS NOTE    Leah Davies  ION:[id number] DOB: [birth year] DOA: 02/19/2020 PCP: Freddy Finner, NP      Assessment & Plan:   Active Problems:   Acute kidney insufficiency   Intractable vomiting with nausea   CKD stage 4 secondary to hypertension (HCC)   Hyperkalemia   Acute lower UTI   Essential hypertension   GERD (gastroesophageal reflux disease)   Anemia of chronic disease   Hypocalcemia   AKI on CKD IV: baseline Cr is unknown possibly around 2.5/2.6 as per EMR review.  Cr is labile. Avoid nephrotoxic meds. Renal US is consistent w/ medical renal disease & 2 cysts, 1 in each kidney. Nephro following and recs apprec.   Hyperkalemia: s/p insulin and D50, IV calcium gluconate and bicarb 74meQ given in the ED. S/p lokelma x 1. Resolved  UTI: continue on IV rocephin. Urine culture growing e. coli.  Hypertension: uncontrolled. Continue hydralazine, imdur & start metoprolol.  Unable to tolerate lisinopril due to hyperkalemia or amlodipine due to lower extremity edema in the past and unable to tolerate diltiazem now secondary to causing dizziness. Plan to d/c home on hydralazine, imdur & metoprolol if pt is able to tolerate all of these medications.    Bradycardia: resolved  Hypocalcemia: due to CKD. S/p Ca gluconate given  GERD: likely cause of her epigastric pain.  Lipase was slightly elevated but had negative CT abdomen and pelvis scan. Continue PPI  Anemia of chronic disease: stable. No need for a transfusion at this time. Will continue to monitor    DVT prophylaxis: heparin  Code Status: full  Family Communication: discussed pt's care w/ pt's family who is at bedside and answered her questions  Disposition Plan: d/c home. Unlikely any barriers  Status is: Inpatient  Remains inpatient appropriate because:Persistent severe electrolyte disturbances and IV treatments appropriate due to intensity of illness or inability to take PO   Dispo: The patient is from:  Home              Anticipated d/c is to: Home               Anticipated d/c date is: 1 day               Patient currently is not medically stable to d/c.         Consultants:      Procedures:    Antimicrobials:   Subjective: Pt c/o dizziness  Objective: Vitals:   02/21/20 2005 02/21/20 2316 02/22/20 0149 02/22/20 0500  BP: (!) 144/56 (!) 175/68 (!) 131/57 (!) 147/54  Pulse: 79 83 70 71  Resp:   18   Temp: 98.3 F (36.8 C)  98.7 F (37.1 C) 98.1 F (36.7 C)  TempSrc: Oral  Oral Oral  SpO2: 97%  100% 98%  Weight:    71.9 kg  Height:        Intake/Output Summary (Last 24 hours) at 02/22/2020 0750 Last data filed at 02/22/2020 0525 Gross per 24 hour  Intake 750 ml  Output 200 ml  Net 550 ml   Filed Weights   02/20/20 0025 02/21/20 0521 02/22/20 0500  Weight: 74.3 kg 74.7 kg 71.9 kg    Examination:  General exam: Appears calm and comfortable  Respiratory system: Clear to auscultation. Respiratory effort normal. No rales Cardiovascular system: S1 & S2 +. No rubs, gallops or clicks.  Gastrointestinal system: Abdomen is nondistended, soft and nontender. Normal bowel sounds heard. Central nervous system: Alert and oriented.  Moves all 4 extremities  Psychiatry: Judgement and insight appear normal. Flat mood and affect.     Data Reviewed: I have personally reviewed following labs and imaging studies  CBC: Recent Labs  Lab 02/19/20 1810 02/20/20 0602 02/21/20 0309 02/22/20 0423  WBC 12.6* 7.9 8.0 8.5  HGB 9.0* 8.1* 9.0* 8.1*  HCT 27.1* 24.4* 27.7* 25.4*  MCV 85.8 85.3 87.9 89.8  PLT 231 199 201 657   Basic Metabolic Panel: Recent Labs  Lab 02/19/20 1810 02/20/20 0602 02/21/20 0309 02/22/20 0423  NA 133* 137 139 141  K 6.1* 5.5* 4.6 4.7  CL 103 107 111 113*  CO2 18* 22 18* 21*  GLUCOSE 170* 106* 95 95  BUN 75* 75* 57* 54*  CREATININE 5.33* 5.50* 5.06* 5.24*  CALCIUM 7.7* 8.1* 8.0* 7.8*  MG  --   --  2.3  --   PHOS  --  5.3*  --   --      GFR: Estimated Creatinine Clearance: 7.8 mL/min (A) (by C-G formula based on SCr of 5.24 mg/dL (H)). Liver Function Tests: Recent Labs  Lab 02/19/20 1810  AST 21  ALT 16  ALKPHOS 101  BILITOT 0.7  PROT 7.2  ALBUMIN 4.0   Recent Labs  Lab 02/19/20 1810  LIPASE 67*   No results for input(s): AMMONIA in the last 168 hours. Coagulation Profile: No results for input(s): INR, PROTIME in the last 168 hours. Cardiac Enzymes: No results for input(s): CKTOTAL, CKMB, CKMBINDEX, TROPONINI in the last 168 hours. BNP (last 3 results) No results for input(s): PROBNP in the last 8760 hours. HbA1C: No results for input(s): HGBA1C in the last 72 hours. CBG: Recent Labs  Lab 02/19/20 2258  GLUCAP 156*   Lipid Profile: No results for input(s): CHOL, HDL, LDLCALC, TRIG, CHOLHDL, LDLDIRECT in the last 72 hours. Thyroid Function Tests: No results for input(s): TSH, T4TOTAL, FREET4, T3FREE, THYROIDAB in the last 72 hours. Anemia Panel: Recent Labs    02/20/20 0602  VITAMINB12 300  FOLATE 10.4  FERRITIN 90  TIBC 210*  IRON 67  RETICCTPCT 1.7   Sepsis Labs: No results for input(s): PROCALCITON, LATICACIDVEN in the last 168 hours.  Recent Results (from the past 240 hour(s))  Urine culture     Status: Abnormal (Preliminary result)   Collection Time: 02/19/20  8:38 PM   Specimen: Urine, Random  Result Value Ref Range Status   Specimen Description   Final    URINE, RANDOM Performed at [facility], [address], [place], [place] [address]    Special Requests   Final    NONE Performed at [facility], [address] [place], [place] [address]    Culture (A)  Final    >=100,000 COLONIES/mL ESCHERICHIA COLI SUSCEPTIBILITIES TO FOLLOW Performed at [facility], [place] [address], [place], [place] [address]    Report Status PENDING  Incomplete  SARS Coronavirus 2 by RT PCR (hospital order, performed in [facility] hospital lab) Nasopharyngeal  Nasopharyngeal Swab     Status: None   Collection Time: 02/19/20  9:27 PM   Specimen: Nasopharyngeal Swab  Result Value Ref Range Status   SARS Coronavirus 2 NEGATIVE NEGATIVE Final    Comment: (NOTE) SARS-CoV-2 target nucleic acids are NOT DETECTED. The SARS-CoV-2 RNA is generally detectable in upper and lower respiratory specimens during the acute phase of infection. The lowest concentration of SARS-CoV-2 viral copies this assay can detect is 250 copies / mL. A negative result  does not preclude SARS-CoV-2 infection and should not be used as the sole basis for treatment or other patient management decisions.  A negative result may occur with improper specimen collection / handling, submission of specimen other than nasopharyngeal swab, presence of viral mutation(s) within the areas targeted by this assay, and inadequate number of viral copies (<250 copies / mL). A negative result must be combined with clinical observations, patient history, and epidemiological information. Fact Sheet for Patients:   StrictlyIdeas.no Fact Sheet for Healthcare Providers: BankingDealers.co.za This test is not yet approved or cleared  by the [place] FDA and has been authorized for detection and/or diagnosis of SARS-CoV-2 by FDA under an Emergency Use Authorization (EUA).  This EUA will remain in effect (meaning this test can be used) for the duration of the COVID-19 declaration under Section 564(b)(1) of the Act, 21 U.S.C. section 360bbb-3(b)(1), unless the authorization is terminated or revoked sooner. Performed at [facility], [address], [place], [place] [address]          Radiology Studies: US RENAL  Result Date: 02/20/2020 CLINICAL DATA:  ATN. EXAM: RENAL / URINARY TRACT ULTRASOUND COMPLETE COMPARISON:  None. FINDINGS: Right Kidney: Renal measurements: 10.1 x 5.5 x 5.2 cm = volume: 152 mL. Diffusely increased cortical  echogenicity. There is a 2.2 x 2.1 x 2.1 cm complex cystic mass in the upper pole of the right kidney. No evidence of hydronephrosis. Left Kidney: Renal measurements: 8.8 x 5.9 x 4.1 cm = volume: 125 mL. Diffusely increased cortical echogenicity. There is a 1.2 x 0.9 x 1.1 cm benign-appearing cyst in the lower pole of the left kidney. No evidence of hydronephrosis. Bladder: Appears normal for degree of bladder distention. Other: None. IMPRESSION: Bilateral echogenic kidneys, consistent with medical renal disease. 2.2 cm complicated benign appearing cyst in the upper pole of the right kidney. Benign-appearing cyst in the lower pole of the left kidney measures 1.2 cm. Electronically Signed   By: Fidela Salisbury M.D.   On: 02/20/2020 15:08        Scheduled Meds: . atorvastatin  80 mg Oral Daily  . diltiazem  180 mg Oral Daily  . heparin  5,000 Units Subcutaneous Q8H  . hydrALAZINE  100 mg Oral Q8H  . isosorbide mononitrate  30 mg Oral Daily  . pantoprazole  40 mg Oral Daily   Continuous Infusions: . cefTRIAXone (ROCEPHIN)  IV 1 g (02/21/20 2008)     LOS: 3 days    Time spent: 33 mins     Wyvonnia Dusky, MD [facility] Pager [phone]  If 7PM-7AM, please contact night-coverage [email or web address] 02/22/2020, 7:50 AM

## 2020-02-22 NOTE — Care Management Important Message (Signed)
Important Message  Patient Details  Name: Leah Davies MRN: [id number] Date of Birth: 1941/08/14   Medicare Important Message Given:  Yes     Dannette Barbara 02/22/2020, 11:14 AM

## 2020-02-23 DIAGNOSIS — N178 Other acute kidney failure: Secondary | ICD-10-CM

## 2020-02-23 DIAGNOSIS — I10 Essential (primary) hypertension: Secondary | ICD-10-CM | POA: Diagnosis present

## 2020-02-23 DIAGNOSIS — N185 Chronic kidney disease, stage 5: Secondary | ICD-10-CM | POA: Diagnosis present

## 2020-02-23 LAB — CBC
HCT: 24.6 % — ABNORMAL LOW (ref 36.0–46.0)
Hemoglobin: 8 g/dL — ABNORMAL LOW (ref 12.0–15.0)
MCH: 28.5 pg (ref 26.0–34.0)
MCHC: 32.5 g/dL (ref 30.0–36.0)
MCV: 87.5 fL (ref 80.0–100.0)
Platelets: 178 10*3/uL (ref 150–400)
RBC: 2.81 MIL/uL — ABNORMAL LOW (ref 3.87–5.11)
RDW: 13.7 % (ref 11.5–15.5)
WBC: 8.6 10*3/uL (ref 4.0–10.5)
nRBC: 0 % (ref 0.0–0.2)

## 2020-02-23 LAB — BASIC METABOLIC PANEL
Anion gap: 8 (ref 5–15)
BUN: 52 mg/dL — ABNORMAL HIGH (ref 8–23)
CO2: 20 mmol/L — ABNORMAL LOW (ref 22–32)
Calcium: 7.9 mg/dL — ABNORMAL LOW (ref 8.9–10.3)
Chloride: 109 mmol/L (ref 98–111)
Creatinine, Ser: 5.34 mg/dL — ABNORMAL HIGH (ref 0.44–1.00)
GFR calc Af Amer: 8 mL/min — ABNORMAL LOW (ref >60)
GFR calc non Af Amer: 7 mL/min — ABNORMAL LOW (ref >60)
Glucose, Bld: 100 mg/dL — ABNORMAL HIGH (ref 70–99)
Potassium: 4.8 mmol/L (ref 3.5–5.1)
Sodium: 137 mmol/L (ref 135–145)

## 2020-02-23 MED ORDER — HYDRALAZINE HCL 100 MG PO TABS: 100.0000 mg | ORAL_TABLET | Freq: Three times a day (TID) | ORAL | 1 refills | Status: DC

## 2020-02-23 MED ORDER — ISOSORBIDE MONONITRATE ER 30 MG PO TB24: 30.0000 mg | ORAL_TABLET | Freq: Every day | ORAL | 1 refills | Status: DC

## 2020-02-23 MED ORDER — METOPROLOL SUCCINATE ER 50 MG PO TB24: 50.0000 mg | ORAL_TABLET | Freq: Every day | ORAL | 1 refills | Status: DC

## 2020-02-23 NOTE — Discharge Summary (Signed)
Physician Discharge Summary  Leah Davies OBS:[id number] DOB: 1941-01-07 DOA: 02/19/2020  PCP: Freddy Finner, NP  Admit date: 02/19/2020 Discharge date: 02/23/2020  Admitted From: Home Disposition: Home  Recommendations for Outpatient Follow-up:  1. Follow up with PCP and nephrology in 1-2 weeks.  Home Health: None Equipment/Devices: None  Discharge Condition: Fair CODE STATUS: Full code Diet recommendation: Renal/low-sodium    Discharge Diagnoses:  Principal Problem:   Acute renal failure superimposed on stage 5 chronic kidney disease, not on chronic dialysis (HCC)  Active Problems:   Intractable vomiting with nausea   Hyperkalemia   Acute lower UTI   GERD (gastroesophageal reflux disease)   Anemia of chronic disease   Hypocalcemia   Uncontrolled hypertension  Brief narrative/HPI 79 year old Hispanic female with chronic kidney disease stage IV (creatinine of 4.1 as per last labs in December 2020), hypertension, chronic venous insufficiency presented with nausea, vomiting, diarrhea and dizziness.  As per daughter patient had her Cardizem dose increased recently. In the ED she was found to be bradycardic with heart rate down to 40s with junctional rhythm and frequent PVCs.  Blood work showed potassium of 6.1, creatinine of 5.33, calcium 7.7, WBC of 12.6 and hemoglobin of nine (from 10.6 in 08/2019). [place] history of UTI.  Patient given IV insulin with D50, IV calcium gluconate, IV bicarb and admitted to medical floor for acute on chronic kidney disease stage V.   Hospital course  Principal problem Acute kidney injury on chronic kidney disease stage V Creatinine stays at 5.3 (was 4.1 in 08/2019).  Being followed by [facility] nephrology clinic in [place] (last seen this month).  Does not have HD access.  Spoke with daughter in length and they are undecided on patient getting dialysis.  No signs of uremia and nephrology recommends no indication for acute dialysis. Recommends that she  follow-up with her nephrologist as outpatient.  Informed daughter Leah Davies) on the phone and explained in detail.  She needs to follow-up with her PCP and nephrologist in 1-2 weeks need repeat renal function.  Instructed to avoid NSAIDs.  Active problems Uncontrolled hypertension Blood pressure medications adjusted and added Toprol 50 mg daily, hydralazine 100 mg 3 times daily and Imdur 30 mg daily.  Controlled at present.  Was on Cardizem as outpatient and dose recently increased with episode of bradycardia in the ED.  Cardizem now discontinued.  Acute hyperkalemia Secondary to AKI.  Resolved with intervention in the ED (IV insulin, calcium gluconate) followed by [facility].  Lisinopril discontinued.  UTI Treated with 5 days of IV Rocephin.  No further dysuria or abdominal pain.  GERD Continue PPI  Sinus bradycardia In the ED with heart rate going down to 40s.  Was taking higher dose of Cardizem.  Resolved and heart rate stable during hospital stay.  Now tolerating Toprol.  Hypocalcemia Secondary to CKD.  Received IV calcium gluconate.  Anemia of chronic kidney disease Stable.  No indication for transfusion.  Follow as outpatient  Procedure: Renal ultrasound Consult: Nephrology Family communication: Discussed at length with daughter on the phone  Disposition: Home  Discharge Instructions   Allergies as of 02/23/2020      Reactions   Amlodipine    Other reaction(s): Unknown   Azithromycin    Other reaction(s): Unknown      Medication List    STOP taking these medications   cefUROXime 250 MG tablet Commonly known as: Ceftin   colchicine 0.6 MG tablet   dextromethorphan-guaiFENesin 30-600 MG 12hr tablet Commonly known as: MUCINEX  DM   diltiazem 180 MG 24 hr capsule Commonly known as: TIAZAC   diltiazem 300 MG 24 hr capsule Commonly known as: CARDIZEM CD   DOCOSAHEXAENOIC ACID PO   famotidine 20 MG tablet Commonly known as: PEPCID   lisinopril 10 MG  tablet Commonly known as: ZESTRIL   meclizine 25 MG tablet Commonly known as: ANTIVERT   ondansetron 4 MG disintegrating tablet Commonly known as: Zofran ODT   pantoprazole 40 MG tablet Commonly known as: PROTONIX   polyethylene glycol powder 17 GM/SCOOP powder Commonly known as: GLYCOLAX/MIRALAX   sucralfate 1 g tablet Commonly known as: Carafate   traMADol 50 MG tablet Commonly known as: ULTRAM     TAKE these medications   acetaminophen 500 MG tablet Commonly known as: TYLENOL Take 500 mg by mouth as needed.   atorvastatin 80 MG tablet Commonly known as: LIPITOR 80 mg.   hydrALAZINE 100 MG tablet Commonly known as: APRESOLINE Take 1 tablet (100 mg total) by mouth every 8 (eight) hours.   isosorbide mononitrate 30 MG 24 hr tablet Commonly known as: IMDUR Take 1 tablet (30 mg total) by mouth daily. Start taking on: Feb 24, 2020   metoprolol succinate 50 MG 24 hr tablet Commonly known as: TOPROL-XL Take 1 tablet (50 mg total) by mouth daily. Take with or immediately following a meal. Start taking on: Feb 24, 2020   omeprazole 40 MG capsule Commonly known as: PRILOSEC Take 40 mg by mouth daily as needed.   tiZANidine 4 MG tablet Commonly known as: ZANAFLEX Take 4 mg by mouth at bedtime.      Follow-up Information    Freddy Finner, NP. Schedule an appointment as soon as possible for a visit in 1 week(s).   Specialty: Nurse Practitioner Contact information: [place] [address] [phone]        Murlean Iba, MD Follow up in 2 week(s).   Specialty: Nephrology Contact information: [place] [address] [phone]          Allergies  Allergen Reactions  . Amlodipine     Other reaction(s): Unknown  . Azithromycin     Other reaction(s): Unknown        Procedures/Studies: CT ABDOMEN PELVIS WO CONTRAST  Result Date: 02/19/2020 CLINICAL DATA:  Nausea and vomiting. EXAM: CT  ABDOMEN AND PELVIS WITHOUT CONTRAST TECHNIQUE: Multidetector CT imaging of the abdomen and pelvis was performed following the standard protocol without IV contrast. COMPARISON:  None. FINDINGS: Lower chest: The lung bases are clear. The heart size is normal. There are coronary artery calcifications. Hepatobiliary: The liver is normal. Normal gallbladder.There is no biliary ductal dilation. Pancreas: Normal contours without ductal dilatation. No peripancreatic fluid collection. Spleen: Unremarkable. Adrenals/Urinary Tract: --Adrenal glands: Unremarkable. --Right kidney/ureter: No hydronephrosis or radiopaque kidney stones. --Left kidney/ureter: No hydronephrosis or radiopaque kidney stones. --Urinary bladder: Unremarkable. Stomach/Bowel: --Stomach/Duodenum: No hiatal hernia or other gastric abnormality. Normal duodenal course and caliber. --Small bowel: Unremarkable. --Colon: Rectosigmoid diverticulosis without acute inflammation. --Appendix: Normal. Vascular/Lymphatic: Atherosclerotic calcification is present within the non-aneurysmal abdominal aorta, without hemodynamically significant stenosis. --No retroperitoneal lymphadenopathy. --No mesenteric lymphadenopathy. --No pelvic or inguinal lymphadenopathy. Reproductive: Unremarkable Other: No ascites or free air. There is a fat containing umbilical hernia. Musculoskeletal. No acute displaced fractures. IMPRESSION: 1. No acute abdominopelvic abnormality. 2. Rectosigmoid diverticulosis without acute inflammation. Aortic Atherosclerosis (ICD10-I70.0). Electronically Signed   By: Constance Holster M.D.   On: 02/19/2020 20:30   US RENAL  Result  Date: 02/20/2020 CLINICAL DATA:  ATN. EXAM: RENAL / URINARY TRACT ULTRASOUND COMPLETE COMPARISON:  None. FINDINGS: Right Kidney: Renal measurements: 10.1 x 5.5 x 5.2 cm = volume: 152 mL. Diffusely increased cortical echogenicity. There is a 2.2 x 2.1 x 2.1 cm complex cystic mass in the upper pole of the right kidney. No  evidence of hydronephrosis. Left Kidney: Renal measurements: 8.8 x 5.9 x 4.1 cm = volume: 125 mL. Diffusely increased cortical echogenicity. There is a 1.2 x 0.9 x 1.1 cm benign-appearing cyst in the lower pole of the left kidney. No evidence of hydronephrosis. Bladder: Appears normal for degree of bladder distention. Other: None. IMPRESSION: Bilateral echogenic kidneys, consistent with medical renal disease. 2.2 cm complicated benign appearing cyst in the upper pole of the right kidney. Benign-appearing cyst in the lower pole of the left kidney measures 1.2 cm. Electronically Signed   By: Fidela Salisbury M.D.   On: 02/20/2020 15:08       Subjective:  Seen and examined with daughter at bedside.  Denies any nausea, vomiting abdominal pain.  Denies any difficulty with urination.  Discharge Exam: Vitals:   02/23/20 1102 02/23/20 1505  BP: (!) 125/46 (!) 128/56  Pulse: 64 65  Resp: 16 16  Temp: 98.3 F (36.8 C) 98.8 F (37.1 C)  SpO2: 97% 98%   Vitals:   02/23/20 0456 02/23/20 0837 02/23/20 1102 02/23/20 1505  BP: (!) 134/46 133/63 (!) 125/46 (!) 128/56  Pulse: 68 72 64 65  Resp:  18 16 16   Temp: 98 F (36.7 C) 98.4 F (36.9 C) 98.3 F (36.8 C) 98.8 F (37.1 C)  TempSrc: Oral Oral Oral Oral  SpO2: 99% 96% 97% 98%  Weight: 71.7 kg     Height:        General: Elderly female not in distress HEENT: Moist mucosa, supple neck Chest: Clear bilaterally CVs: Normal S1-S2 GI: Soft, nondistended or nontender Musculoskeletal: Warm, trace edema CNS: Alert and oriented, no tremors    The results of significant diagnostics from this hospitalization (including imaging, microbiology, ancillary and laboratory) are listed below for reference.     Microbiology: Recent Results (from the past 240 hour(s))  Urine culture     Status: Abnormal   Collection Time: 02/19/20  8:38 PM   Specimen: Urine, Random  Result Value Ref Range Status   Specimen Description   Final    URINE,  RANDOM Performed at [facility], [address] [place], [place] [address]    Special Requests   Final    NONE Performed at [facility], [place], [place] [address]    Culture >=100,000 COLONIES/mL ESCHERICHIA COLI (A)  Final   Report Status 02/22/2020 FINAL  Final   Organism ID, Bacteria ESCHERICHIA COLI (A)  Final      Susceptibility   Escherichia coli - MIC*    AMPICILLIN <=2 SENSITIVE Sensitive     CEFAZOLIN <=4 SENSITIVE Sensitive     CEFTRIAXONE <=1 SENSITIVE Sensitive     CIPROFLOXACIN <=0.25 SENSITIVE Sensitive     GENTAMICIN >=16 RESISTANT Resistant     IMIPENEM <=0.25 SENSITIVE Sensitive     NITROFURANTOIN <=16 SENSITIVE Sensitive     TRIMETH/SULFA >=320 RESISTANT Resistant     AMPICILLIN/SULBACTAM <=2 SENSITIVE Sensitive     PIP/TAZO <=4 SENSITIVE Sensitive     * >=100,000 COLONIES/mL ESCHERICHIA COLI  SARS Coronavirus 2 by RT PCR (hospital order, performed in [facility] hospital lab) Nasopharyngeal Nasopharyngeal Swab  Status: None   Collection Time: 02/19/20  9:27 PM   Specimen: Nasopharyngeal Swab  Result Value Ref Range Status   SARS Coronavirus 2 NEGATIVE NEGATIVE Final    Comment: (NOTE) SARS-CoV-2 target nucleic acids are NOT DETECTED. The SARS-CoV-2 RNA is generally detectable in upper and lower respiratory specimens during the acute phase of infection. The lowest concentration of SARS-CoV-2 viral copies this assay can detect is 250 copies / mL. A negative result does not preclude SARS-CoV-2 infection and should not be used as the sole basis for treatment or other patient management decisions.  A negative result may occur with improper specimen collection / handling, submission of specimen other than nasopharyngeal swab, presence of viral mutation(s) within the areas targeted by this assay, and inadequate number of viral copies (<250 copies / mL). A negative result must be combined with clinical observations, patient  history, and epidemiological information. Fact Sheet for Patients:   StrictlyIdeas.no Fact Sheet for Healthcare Providers: BankingDealers.co.za This test is not yet approved or cleared  by the [place] FDA and has been authorized for detection and/or diagnosis of SARS-CoV-2 by FDA under an Emergency Use Authorization (EUA).  This EUA will remain in effect (meaning this test can be used) for the duration of the COVID-19 declaration under Section 564(b)(1) of the Act, 21 U.S.C. section 360bbb-3(b)(1), unless the authorization is terminated or revoked sooner. Performed at [facility], [address] [place],  [address]      Labs: BNP (last 3 results) No results for input(s): BNP in the last 8760 hours. Basic Metabolic Panel: Recent Labs  Lab 02/19/20 1810 02/20/20 0602 02/21/20 0309 02/22/20 0423 02/23/20 0546  NA 133* 137 139 141 137  K 6.1* 5.5* 4.6 4.7 4.8  CL 103 107 111 113* 109  CO2 18* 22 18* 21* 20*  GLUCOSE 170* 106* 95 95 100*  BUN 75* 75* 57* 54* 52*  CREATININE 5.33* 5.50* 5.06* 5.24* 5.34*  CALCIUM 7.7* 8.1* 8.0* 7.8* 7.9*  MG  --   --  2.3  --   --   PHOS  --  5.3*  --   --   --    Liver Function Tests: Recent Labs  Lab 02/19/20 1810  AST 21  ALT 16  ALKPHOS 101  BILITOT 0.7  PROT 7.2  ALBUMIN 4.0   Recent Labs  Lab 02/19/20 1810  LIPASE 67*   No results for input(s): AMMONIA in the last 168 hours. CBC: Recent Labs  Lab 02/19/20 1810 02/20/20 0602 02/21/20 0309 02/22/20 0423 02/23/20 0546  WBC 12.6* 7.9 8.0 8.5 8.6  HGB 9.0* 8.1* 9.0* 8.1* 8.0*  HCT 27.1* 24.4* 27.7* 25.4* 24.6*  MCV 85.8 85.3 87.9 89.8 87.5  PLT 231 199 201 195 178   Cardiac Enzymes: No results for input(s): CKTOTAL, CKMB, CKMBINDEX, TROPONINI in the last 168 hours. BNP: Invalid input(s): POCBNP CBG: Recent Labs  Lab 02/19/20 2258  GLUCAP 156*   D-Dimer No results for input(s): DDIMER in  the last 72 hours. Hgb A1c No results for input(s): HGBA1C in the last 72 hours. Lipid Profile No results for input(s): CHOL, HDL, LDLCALC, TRIG, CHOLHDL, LDLDIRECT in the last 72 hours. Thyroid function studies No results for input(s): TSH, T4TOTAL, T3FREE, THYROIDAB in the last 72 hours.  Invalid input(s): FREET3 Anemia work up No results for input(s): VITAMINB12, FOLATE, FERRITIN, TIBC, IRON, RETICCTPCT in the last 72 hours. Urinalysis    Component Value Date/Time   COLORURINE YELLOW (A) 02/19/2020 1809  APPEARANCEUR CLOUDY (A) 02/19/2020 1809   LABSPEC 1.014 02/19/2020 1809   PHURINE 5.0 02/19/2020 1809   GLUCOSEU NEGATIVE 02/19/2020 1809   HGBUR NEGATIVE 02/19/2020 1809   BILIRUBINUR NEGATIVE 02/19/2020 1809   KETONESUR NEGATIVE 02/19/2020 1809   PROTEINUR 100 (A) 02/19/2020 1809   NITRITE NEGATIVE 02/19/2020 1809   LEUKOCYTESUR LARGE (A) 02/19/2020 1809   Sepsis Labs Invalid input(s): PROCALCITONIN,  WBC,  LACTICIDVEN Microbiology Recent Results (from the past 240 hour(s))  Urine culture     Status: Abnormal   Collection Time: 02/19/20  8:38 PM   Specimen: Urine, Random  Result Value Ref Range Status   Specimen Description   Final    URINE, RANDOM Performed at [facility], [address] [place], [place] [address]    Special Requests   Final    NONE Performed at [facility], [address] [place], [place] [address]    Culture >=100,000 COLONIES/mL ESCHERICHIA COLI (A)  Final   Report Status 02/22/2020 FINAL  Final   Organism ID, Bacteria ESCHERICHIA COLI (A)  Final      Susceptibility   Escherichia coli - MIC*    AMPICILLIN <=2 SENSITIVE Sensitive     CEFAZOLIN <=4 SENSITIVE Sensitive     CEFTRIAXONE <=1 SENSITIVE Sensitive     CIPROFLOXACIN <=0.25 SENSITIVE Sensitive     GENTAMICIN >=16 RESISTANT Resistant     IMIPENEM <=0.25 SENSITIVE Sensitive     NITROFURANTOIN <=16 SENSITIVE Sensitive     TRIMETH/SULFA >=320 RESISTANT  Resistant     AMPICILLIN/SULBACTAM <=2 SENSITIVE Sensitive     PIP/TAZO <=4 SENSITIVE Sensitive     * >=100,000 COLONIES/mL ESCHERICHIA COLI  SARS Coronavirus 2 by RT PCR (hospital order, performed in [place] hospital lab) Nasopharyngeal Nasopharyngeal Swab     Status: None   Collection Time: 02/19/20  9:27 PM   Specimen: Nasopharyngeal Swab  Result Value Ref Range Status   SARS Coronavirus 2 NEGATIVE NEGATIVE Final    Comment: (NOTE) SARS-CoV-2 target nucleic acids are NOT DETECTED. The SARS-CoV-2 RNA is generally detectable in upper and lower respiratory specimens during the acute phase of infection. The lowest concentration of SARS-CoV-2 viral copies this assay can detect is 250 copies / mL. A negative result does not preclude SARS-CoV-2 infection and should not be used as the sole basis for treatment or other patient management decisions.  A negative result may occur with improper specimen collection / handling, submission of specimen other than nasopharyngeal swab, presence of viral mutation(s) within the areas targeted by this assay, and inadequate number of viral copies (<250 copies / mL). A negative result must be combined with clinical observations, patient history, and epidemiological information. Fact Sheet for Patients:   StrictlyIdeas.no Fact Sheet for Healthcare Providers: BankingDealers.co.za This test is not yet approved or cleared  by the [place] FDA and has been authorized for detection and/or diagnosis of SARS-CoV-2 by FDA under an Emergency Use Authorization (EUA).  This EUA will remain in effect (meaning this test can be used) for the duration of the COVID-19 declaration under Section 564(b)(1) of the Act, 21 U.S.C. section 360bbb-3(b)(1), unless the authorization is terminated or revoked sooner. Performed at [facility], [address], [place], [place] [address]      Time coordinating  discharge: 35 minutes  SIGNED:   Louellen Molder, MD  [facility] 02/23/2020, 3:34 PM Pager   If 7PM-7AM, please contact night-coverage [email or web address] Password [email or web address]

## 2020-02-23 NOTE — Progress Notes (Signed)
[place], [place] 02/23/20  Subjective:   Hospital day # 4  Conversation through [place] interpreter.  Daughter in the room Overall she is feeling well.  Able to eat better without nausea or vomiting. No shortness of breath No leg edema Serum creatinine remains critically elevated with small variation Renal: 05/25 0701 - 05/26 0700 In: 720 [P.O.:720] Out: 0  Lab Results  Component Value Date   CREATININE 5.34 (H) 02/23/2020   CREATININE 5.24 (H) 02/22/2020   CREATININE 5.06 (H) 02/21/2020     Objective:  Vital signs in last 24 hours:  Temp:  [98 F (36.7 C)-98.9 F (37.2 C)] 98.3 F (36.8 C) (05/26 1102) Pulse Rate:  [64-81] 64 (05/26 1102) Resp:  [16-18] 16 (05/26 1102) BP: (125-149)/(46-63) 125/46 (05/26 1102) SpO2:  [96 %-99 %] 97 % (05/26 1102) Weight:  [71.7 kg] 71.7 kg (05/26 0456)  Weight change: -0.181 kg Filed Weights   02/21/20 0521 02/22/20 0500 02/23/20 0456  Weight: 74.7 kg 71.9 kg 71.7 kg    Intake/Output:    Intake/Output Summary (Last 24 hours) at 02/23/2020 1137 Last data filed at 02/22/2020 2033 Gross per 24 hour  Intake 480 ml  Output 0 ml  Net 480 ml    Physical Exam: General:  No acute distress, laying in the bed  HEENT  anicteric, moist oral mucous membrane  Pulm/lungs  normal breathing effort, lungs are clear to auscultation  CVS/Heart  regular rhythm, no rub or gallop  Abdomen:   Soft, nontender  Extremities:  No peripheral edema  Neurologic:  Alert, oriented, able to follow commands  Skin:  No acute rashes     Basic Metabolic Panel:  Recent Labs  Lab 02/19/20 1810 02/19/20 1810 02/20/20 0602 02/20/20 0602 02/21/20 0309 02/22/20 0423 02/23/20 0546  NA 133*  --  137  --  139 141 137  K 6.1*  --  5.5*  --  4.6 4.7 4.8  CL 103  --  107  --  111 113* 109  CO2 18*  --  22  --  18* 21* 20*  GLUCOSE 170*  --  106*  --  95 95 100*  BUN 75*  --  75*  --  57* 54* 52*  CREATININE 5.33*  --   5.50*  --  5.06* 5.24* 5.34*  CALCIUM 7.7*   < > 8.1*   < > 8.0* 7.8* 7.9*  MG  --   --   --   --  2.3  --   --   PHOS  --   --  5.3*  --   --   --   --    < > = values in this interval not displayed.     CBC: Recent Labs  Lab 02/19/20 1810 02/20/20 0602 02/21/20 0309 02/22/20 0423 02/23/20 0546  WBC 12.6* 7.9 8.0 8.5 8.6  HGB 9.0* 8.1* 9.0* 8.1* 8.0*  HCT 27.1* 24.4* 27.7* 25.4* 24.6*  MCV 85.8 85.3 87.9 89.8 87.5  PLT 231 199 201 195 178     No results found for: HEPBSAG, HEPBSAB, HEPBIGM    Microbiology:  Recent Results (from the past 240 hour(s))  Urine culture     Status: Abnormal   Collection Time: 02/19/20  8:38 PM   Specimen: Urine, Random  Result Value Ref Range Status   Specimen Description   Final    URINE, RANDOM Performed at [facility], [address], [place], [place] [address]  Special Requests   Final    NONE Performed at [facility], [place], [place] [address]    Culture >=100,000 COLONIES/mL ESCHERICHIA COLI (A)  Final   Report Status 02/22/2020 FINAL  Final   Organism ID, Bacteria ESCHERICHIA COLI (A)  Final      Susceptibility   Escherichia coli - MIC*    AMPICILLIN <=2 SENSITIVE Sensitive     CEFAZOLIN <=4 SENSITIVE Sensitive     CEFTRIAXONE <=1 SENSITIVE Sensitive     CIPROFLOXACIN <=0.25 SENSITIVE Sensitive     GENTAMICIN >=16 RESISTANT Resistant     IMIPENEM <=0.25 SENSITIVE Sensitive     NITROFURANTOIN <=16 SENSITIVE Sensitive     TRIMETH/SULFA >=320 RESISTANT Resistant     AMPICILLIN/SULBACTAM <=2 SENSITIVE Sensitive     PIP/TAZO <=4 SENSITIVE Sensitive     * >=100,000 COLONIES/mL ESCHERICHIA COLI  SARS Coronavirus 2 by RT PCR (hospital order, performed in [place] hospital lab) Nasopharyngeal Nasopharyngeal Swab     Status: None   Collection Time: 02/19/20  9:27 PM   Specimen: Nasopharyngeal Swab  Result Value Ref Range Status   SARS Coronavirus 2 NEGATIVE NEGATIVE Final    Comment:  (NOTE) SARS-CoV-2 target nucleic acids are NOT DETECTED. The SARS-CoV-2 RNA is generally detectable in upper and lower respiratory specimens during the acute phase of infection. The lowest concentration of SARS-CoV-2 viral copies this assay can detect is 250 copies / mL. A negative result does not preclude SARS-CoV-2 infection and should not be used as the sole basis for treatment or other patient management decisions.  A negative result may occur with improper specimen collection / handling, submission of specimen other than nasopharyngeal swab, presence of viral mutation(s) within the areas targeted by this assay, and inadequate number of viral copies (<250 copies / mL). A negative result must be combined with clinical observations, patient history, and epidemiological information. Fact Sheet for Patients:   StrictlyIdeas.no Fact Sheet for Healthcare Providers: BankingDealers.co.za This test is not yet approved or cleared  by the [place] FDA and has been authorized for detection and/or diagnosis of SARS-CoV-2 by FDA under an Emergency Use Authorization (EUA).  This EUA will remain in effect (meaning this test can be used) for the duration of the COVID-19 declaration under Section 564(b)(1) of the Act, 21 U.S.C. section 360bbb-3(b)(1), unless the authorization is terminated or revoked sooner. Performed at [facility], [address] [place], [place] [address]     Coagulation Studies: No results for input(s): LABPROT, INR in the last 72 hours.  Urinalysis: No results for input(s): COLORURINE, LABSPEC, PHURINE, GLUCOSEU, HGBUR, BILIRUBINUR, KETONESUR, PROTEINUR, UROBILINOGEN, NITRITE, LEUKOCYTESUR in the last 72 hours.  Invalid input(s): APPERANCEUR    Imaging: No results found.   Medications:   . cefTRIAXone (ROCEPHIN)  IV 1 g (02/22/20 2131)   . atorvastatin  80 mg Oral Daily  . heparin  5,000 Units  Subcutaneous Q8H  . hydrALAZINE  100 mg Oral Q8H  . isosorbide mononitrate  30 mg Oral Daily  . metoprolol succinate  50 mg Oral Daily  . pantoprazole  40 mg Oral Daily   acetaminophen, hydrALAZINE, ondansetron (ZOFRAN) IV, ondansetron, witch hazel-glycerin  Assessment/ Plan:  79 y.o. female with hypertension, chronic kidney disease stage IV, chronic venous insufficiency    admitted on 02/19/2020 for Hyperkalemia [E87.5] Weakness [R53.1] Acute kidney injury ([place]) [N17.9] Intractable vomiting with nausea [R11.2] Non-intractable vomiting with nausea, unspecified vomiting type [R11.2]  #Acute kidney injury on chronic kidney  disease stage V  Last outpatient creatinine of 4.12/GFR 10 in December 2020 Patient was last seen at [facility] nephrology clinic in [place] in May 2021 Patient does not have an access at this time No overt uremic symptoms. No acute indication for dialysis She will follow-up with her nephrology team as outpatient  #Hypertension Patient has severe hypertension Blood pressure medications are being adjusted Current regimen includes Toprol-XL 50 mg daily Hydralazine 100 mg 3 times a day Imdur 30 mg daily Blood pressure and volume control are acceptable    LOS: [place] 5/26/202111:37 AM  [place], [place]  Note: This note was prepared with Dragon dictation. Any transcription errors are unintentional

## 2020-02-23 NOTE — Progress Notes (Addendum)
Discharge instructions explained to pt and pts daughter via interpreter / verbalized an understanding/ iv and tele removed/ pt refused wheelchair/ ambulated off unit with daughter

## 2020-02-23 NOTE — Discharge Instructions (Signed)
Enfermedad renal crnica en adultos Chronic Kidney Disease, Adult La enfermedad renal crnica (ERC) se produce cuando los riones se daan lentamente durante un perodo prolongado. Los riones son un par de rganos que desempean muchas funciones importantes en el cuerpo, que incluyen las siguientes:  Eliminar desechos y el exceso de lquido de la sangre para producir orina.  Producir hormonas que [employer] cantidad de lquido [employer] tejidos y los vasos sanguneos.  Mantener la cantidad correcta de lquidos y de sustancias qumicas en el cuerpo. Un pequeo dao renal puede no causar problemas, pero un dao mayor puede dificultar o impedir que los riones funcionen de la [place]. Si no se toman medidas para frenar el dao renal o evitar que empeore, los riones pueden dejar de funcionar de forma permanente (enfermedad renal terminal o ERT). [place] veces, la ERC no desaparece, pero a menudo puede controlarse. Por lo general, las personas con ERC pueden tener una vida normal. Cules son las causas? Las causas ms frecuentes de esta afeccin son la diabetes y la presin arterial alta (hipertensin). Algunas otras causas son las siguientes:  Enfermedades cardacas y de los vasos sanguneos (cardiovasculares).  Enfermedades renales, por ejemplo: ? Glomerulonefritis. ? Nefritis intersticial. ? Enfermedad renal poliqustica. ? Enfermedad vascular renal.  Enfermedades que afectan el sistema inmunitario.  Enfermedades genticas.  Medicamentos que daan los riones, como los antinflamatorios no esteroides Literberry).  Estar cerca de sustancias venenosas (txicas) o entrar en contacto con ellas.  Una infeccin urinaria o renal que se repite una y otra vez (recurrente).  Vasculitis. Es la hinchazn (inflamacin) de los vasos sanguneos.  Un problema con el flujo de orina, que puede ser causado por lo siguiente: ? Cncer. ? Clculos renales que se repiten. ? Prstata  agrandada en los hombres. Qu incrementa el riesgo? Es ms probable que tenga esta afeccin en los siguientes casos:  Tiene ms de 60aos.  Es mujer.  Es afroamericano, hispano, asitico, isleo del Pacfico o indgena norteamericano.  Es fumador actual o exfumador.  Es obeso.  Tiene antecedentes familiares de insuficiencia o enfermedad renal.  A menudo toma medicamentos que daan los riones. Cules son los signos o los sntomas? Los sntomas de esta afeccin incluyen los siguientes:  Hinchazn (edema) de la cara, las piernas, los tobillos o los pies.  Cansancio Engineer, manufacturing) y Dispensing optician.  Nuseas o vmitos.  Confusin o dificultad para concentrarse.  Problemas en la miccin, tales como: ? Sensacin de Social research officer, government o ardor al Garment/textile technologist. ? Disminucin de la produccin de [place]. ? Aumento de los deseos de Garment/textile technologist, especialmente por la noche. ? Sangre en la orina.  Contracciones y calambres musculares, especialmente en las piernas.  Falta de aire.  Debilidad.  Prdida del apetito.  Gusto metlico en la boca.  Dificultad para dormir.  Piel seca y que pica.  Bajo recuento de glbulos rojos (anemia).  Palidez en los prpados y la superficie del ojo (conjuntiva). Los sntomas se desarrollan lentamente y pueden no ser evidentes hasta que el dao renal es grave. Es posible tener una enfermedad renal durante aos sin presentar sntomas. Cmo se diagnostica? Esta afeccin se puede diagnosticar en funcin de lo siguiente:  Anlisis de sangre.  Anlisis de [place].  Estudios de diagnstico por imgenes, como una ecografa o una exploracin por tomografa computarizada (TC).  Un estudio en el que se toma una muestra de tejido de los riones para examinarla con un microscopio (biopsia de rin). Los resultados de este estudio ayudarn a  que el mdico determine la gravedad de la ERC. Cmo se trata? No hay cura para la [employer]ov[employer] Enterprises de esta afeccin, [place], en general,  el tratamiento [employer] sntomas y evita o retrasa el progreso de la enfermedad. El tratamiento puede incluir lo siguiente:  Actor en la dieta, como evitar el alcohol, los alimentos salados (sodio) y ricos en potasio, calcio y protenas.  Medicamentos administrados con estos fines: ? Disminuir la presin arterial. ? Controlar el nivel de glucemia. ? Mejorar la anemia. ? Disminuir la hinchazn. ? Proteger los Affiliated Computer Services. ? Mejorar el equilibrio de Environmental consultant.  Eliminar desechos txicos del organismo mediante tipos de dilisis si los riones ya no funcionan (insuficiencia renal).  Tratar otras afecciones que causan la ERC o que la [place]. Siga estas indicaciones en su casa: Medicamentos  Delphi de venta libre y los recetados solamente como se lo haya indicado el mdico. Es posible que sea necesario ajustar la dosis de algunos medicamentos que toma.  No tome ningn medicamento nuevo a menos que lo haya autorizado el mdico. Muchos medicamentos pueden empeorar el dao renal.  No tome ningn suplemento vitamnico y mineral, a menos que lo haya autorizado el mdico. Muchos suplementos nutricionales pueden empeorar el dao renal. Instrucciones generales  Siga la dieta indicada por el mdico.  No consuma ningn producto que contenga nicotina o tabaco, como cigarrillos y Psychologist, sport and exercise. Si necesita ayuda para dejar de fumar, consulte al [employer].  Controle y realice un seguimiento de su presin arterial en casa. Informe los cambios en la presin arterial como se lo haya indicado el mdico.  Si recibe tratamiento para la diabetes, controle y Recruitment consultant un seguimiento de los niveles de Location manager en la sangre (glucemia) como se lo haya indicado el mdico.  Mantenga un peso saludable. Si necesita ayuda para lograrlo, consulte a su mdico.  Comience o contine un plan de ejercicios. Haga ejercicios al menos 75mnutos al da, 5das a la  semana.  Mantenga sus inmunizaciones al dYUM! Brandsse lo haya indicado el mdico.  Concurra a todas las visitas de seguimiento como se lo haya indicado el mdico. Esto es importante. Dnde encontrar ms informacin  Asociacin Americana de Pacientes Renales (American Association of Kidney Patients, AAKP): wBombTimer.gl FLyle([place]: www.kidney.org  Fondo Americano para Problemas Renales ([place]: whttps://mathis.com/ Programa de rehabilitacin de Life Options: www.lifeoptions.org y www.kidneyschool.org Comunquese con un mdico si:  Los sntomas empeoran.  Presenta nuevos sntomas. Solicite ayuda de inmediato si:  Aparecen sntomas de ERT, por ejemplo: ? Dolores de [place] ? Entumecimiento en las manos o en los pies. ? Aparecen hematomas con facilidad. ? Hipo frecuente. ? Dolor en el pecho. ? Falta de aire. ? Falta de mThe PNC Financial  Tiene fiebre.  Disminuye la produccin de o[place]  Siente dolor o tiene sangre al oContinental Airlines Resumen  La enfermedad renal crnica (ERC) se produce cuando los riones se daan lentamente durante un perodo prolongado.  Las causas ms frecuentes de esta afeccin son la diabetes y la presin arterial alta (hipertensin).  No hay cura para la mHovnanian Enterprisesde esta afeccin, pBermuda Dunes en general, el tratamiento aTXU Corpsntomas y evita o retrasa el progreso de la enfermedad. El tratamiento puede incluir una combinacin de medicamentos y cambios en el estilo de vida. Esta informacin no tiene cMarine scientistel consejo del mdico. Asegrese de hacerle al mdico cualquier pregunta que tenga.  Document Revised: 01/14/2017 Document Reviewed: 01/06/2017 Elsevier Patient Education  [place].

## 2020-04-08 ENCOUNTER — Other Ambulatory Visit: Payer: Self-pay

## 2020-04-08 ENCOUNTER — Encounter: Payer: Self-pay | Admitting: Intensive Care

## 2020-04-08 ENCOUNTER — Inpatient Hospital Stay
Admission: EM | Admit: 2020-04-08 | Discharge: 2020-04-13 | DRG: 674 | Disposition: A | Discharge status: Home / Self Care | Payer: Medicare HMO | Attending: Internal Medicine | Admitting: Internal Medicine

## 2020-04-08 DIAGNOSIS — E785 Hyperlipidemia, unspecified: Secondary | ICD-10-CM | POA: Diagnosis present

## 2020-04-08 DIAGNOSIS — N179 Acute kidney failure, unspecified: Secondary | ICD-10-CM | POA: Diagnosis present

## 2020-04-08 DIAGNOSIS — I120 Hypertensive chronic kidney disease with stage 5 chronic kidney disease or end stage renal disease: Secondary | ICD-10-CM | POA: Diagnosis present

## 2020-04-08 DIAGNOSIS — R0989 Other specified symptoms and signs involving the circulatory and respiratory systems: Secondary | ICD-10-CM | POA: Diagnosis present

## 2020-04-08 DIAGNOSIS — Z803 Family history of malignant neoplasm of breast: Secondary | ICD-10-CM

## 2020-04-08 DIAGNOSIS — I10 Essential (primary) hypertension: Secondary | ICD-10-CM

## 2020-04-08 DIAGNOSIS — M109 Gout, unspecified: Secondary | ICD-10-CM | POA: Diagnosis present

## 2020-04-08 DIAGNOSIS — R519 Headache, unspecified: Secondary | ICD-10-CM

## 2020-04-08 DIAGNOSIS — R202 Paresthesia of skin: Secondary | ICD-10-CM | POA: Diagnosis present

## 2020-04-08 DIAGNOSIS — K5900 Constipation, unspecified: Secondary | ICD-10-CM

## 2020-04-08 DIAGNOSIS — E872 Acidosis, unspecified: Secondary | ICD-10-CM

## 2020-04-08 DIAGNOSIS — I160 Hypertensive urgency: Secondary | ICD-10-CM

## 2020-04-08 DIAGNOSIS — D631 Anemia in chronic kidney disease: Secondary | ICD-10-CM | POA: Diagnosis present

## 2020-04-08 DIAGNOSIS — N185 Chronic kidney disease, stage 5: Secondary | ICD-10-CM | POA: Diagnosis present

## 2020-04-08 DIAGNOSIS — Z79899 Other long term (current) drug therapy: Secondary | ICD-10-CM

## 2020-04-08 DIAGNOSIS — R0982 Postnasal drip: Secondary | ICD-10-CM | POA: Diagnosis present

## 2020-04-08 DIAGNOSIS — D638 Anemia in other chronic diseases classified elsewhere: Secondary | ICD-10-CM | POA: Diagnosis present

## 2020-04-08 DIAGNOSIS — R42 Dizziness and giddiness: Secondary | ICD-10-CM | POA: Diagnosis not present

## 2020-04-08 DIAGNOSIS — D696 Thrombocytopenia, unspecified: Secondary | ICD-10-CM | POA: Diagnosis present

## 2020-04-08 DIAGNOSIS — Z888 Allergy status to other drugs, medicaments and biological substances status: Secondary | ICD-10-CM

## 2020-04-08 DIAGNOSIS — E875 Hyperkalemia: Secondary | ICD-10-CM | POA: Diagnosis present

## 2020-04-08 DIAGNOSIS — N186 End stage renal disease: Secondary | ICD-10-CM

## 2020-04-08 DIAGNOSIS — Z20822 Contact with and (suspected) exposure to covid-19: Secondary | ICD-10-CM | POA: Diagnosis present

## 2020-04-08 DIAGNOSIS — N2581 Secondary hyperparathyroidism of renal origin: Secondary | ICD-10-CM | POA: Diagnosis present

## 2020-04-08 DIAGNOSIS — K219 Gastro-esophageal reflux disease without esophagitis: Secondary | ICD-10-CM | POA: Diagnosis present

## 2020-04-08 HISTORY — DX: Gout, unspecified: M10.9

## 2020-04-08 LAB — URINALYSIS, COMPLETE (UACMP) WITH MICROSCOPIC
Bacteria, UA: NONE SEEN
Bilirubin Urine: NEGATIVE
Glucose, UA: NEGATIVE mg/dL
Hgb urine dipstick: NEGATIVE
Ketones, ur: NEGATIVE mg/dL
Leukocytes,Ua: NEGATIVE
Nitrite: NEGATIVE
Protein, ur: 100 mg/dL — AB
Specific Gravity, Urine: 1.006 (ref 1.005–1.030)
Squamous Epithelial / HPF: NONE SEEN (ref 0–5)
pH: 5 (ref 5.0–8.0)

## 2020-04-08 LAB — CBC
HCT: 27.8 % — ABNORMAL LOW (ref 36.0–46.0)
Hemoglobin: 9 g/dL — ABNORMAL LOW (ref 12.0–15.0)
MCH: 28.5 pg (ref 26.0–34.0)
MCHC: 32.4 g/dL (ref 30.0–36.0)
MCV: 88 fL (ref 80.0–100.0)
Platelets: 190 10*3/uL (ref 150–400)
RBC: 3.16 MIL/uL — ABNORMAL LOW (ref 3.87–5.11)
RDW: 13.1 % (ref 11.5–15.5)
WBC: 7.4 10*3/uL (ref 4.0–10.5)
nRBC: 0 % (ref 0.0–0.2)

## 2020-04-08 LAB — BASIC METABOLIC PANEL
Anion gap: 7 (ref 5–15)
BUN: 85 mg/dL — ABNORMAL HIGH (ref 8–23)
CO2: 19 mmol/L — ABNORMAL LOW (ref 22–32)
Calcium: 8.1 mg/dL — ABNORMAL LOW (ref 8.9–10.3)
Chloride: 108 mmol/L (ref 98–111)
Creatinine, Ser: 5.64 mg/dL — ABNORMAL HIGH (ref 0.44–1.00)
GFR calc Af Amer: 8 mL/min — ABNORMAL LOW (ref >60)
GFR calc non Af Amer: 7 mL/min — ABNORMAL LOW (ref >60)
Glucose, Bld: 108 mg/dL — ABNORMAL HIGH (ref 70–99)
Potassium: 6 mmol/L — ABNORMAL HIGH (ref 3.5–5.1)
Sodium: 134 mmol/L — ABNORMAL LOW (ref 135–145)

## 2020-04-08 MED ORDER — SODIUM ZIRCONIUM CYCLOSILICATE 10 G PO PACK
10.0000 g | PACK | Freq: Once | ORAL | Status: AC
  Administered 2020-04-09: 10 g via ORAL
  Filled 2020-04-08: qty 1

## 2020-04-08 MED ORDER — FUROSEMIDE 10 MG/ML IJ SOLN
20.0000 mg | Freq: Once | INTRAMUSCULAR | Status: AC
  Administered 2020-04-09: 20 mg via INTRAVENOUS
  Filled 2020-04-08: qty 4

## 2020-04-08 NOTE — ED Triage Notes (Signed)
Patient c/o lightheadedness and hypertension. Stage 4 kidney disease. Not on dialysis at this time. Grandson reports some of her medicines were recently discontinued. Recent blood work showed high potassium. Patient spanish speaking only.

## 2020-04-08 NOTE — ED Notes (Signed)
Pt's son to registration desk, asking if patient can take her hydralazine, spoke with Dr. Joni Fears, pt Ok to take hydralazine while waiting.

## 2020-04-08 NOTE — ED Notes (Signed)
VS re-checked per son's request due to elevated BP previously. Pt had taken a dose of hydralazine around 20:00.

## 2020-04-09 DIAGNOSIS — E872 Acidosis, unspecified: Secondary | ICD-10-CM

## 2020-04-09 DIAGNOSIS — N185 Chronic kidney disease, stage 5: Secondary | ICD-10-CM | POA: Diagnosis not present

## 2020-04-09 DIAGNOSIS — N179 Acute kidney failure, unspecified: Secondary | ICD-10-CM | POA: Diagnosis not present

## 2020-04-09 DIAGNOSIS — E875 Hyperkalemia: Secondary | ICD-10-CM

## 2020-04-09 DIAGNOSIS — I160 Hypertensive urgency: Secondary | ICD-10-CM

## 2020-04-09 LAB — CBC
HCT: 24.5 % — ABNORMAL LOW (ref 36.0–46.0)
Hemoglobin: 8 g/dL — ABNORMAL LOW (ref 12.0–15.0)
MCH: 28.6 pg (ref 26.0–34.0)
MCHC: 32.7 g/dL (ref 30.0–36.0)
MCV: 87.5 fL (ref 80.0–100.0)
Platelets: 230 10*3/uL (ref 150–400)
RBC: 2.8 MIL/uL — ABNORMAL LOW (ref 3.87–5.11)
RDW: 13.1 % (ref 11.5–15.5)
WBC: 7.2 10*3/uL (ref 4.0–10.5)
nRBC: 0 % (ref 0.0–0.2)

## 2020-04-09 LAB — BASIC METABOLIC PANEL
Anion gap: 10 (ref 5–15)
BUN: 86 mg/dL — ABNORMAL HIGH (ref 8–23)
CO2: 18 mmol/L — ABNORMAL LOW (ref 22–32)
Calcium: 8.3 mg/dL — ABNORMAL LOW (ref 8.9–10.3)
Chloride: 110 mmol/L (ref 98–111)
Creatinine, Ser: 5.36 mg/dL — ABNORMAL HIGH (ref 0.44–1.00)
GFR calc Af Amer: 8 mL/min — ABNORMAL LOW (ref >60)
GFR calc non Af Amer: 7 mL/min — ABNORMAL LOW (ref >60)
Glucose, Bld: 108 mg/dL — ABNORMAL HIGH (ref 70–99)
Potassium: 5 mmol/L (ref 3.5–5.1)
Sodium: 138 mmol/L (ref 135–145)

## 2020-04-09 LAB — SARS CORONAVIRUS 2 BY RT PCR (HOSPITAL ORDER, PERFORMED IN ~~LOC~~ HOSPITAL LAB): SARS Coronavirus 2: NEGATIVE

## 2020-04-09 MED ORDER — ENOXAPARIN SODIUM 30 MG/0.3ML ~~LOC~~ SOLN
30.0000 mg | SUBCUTANEOUS | Status: DC
  Administered 2020-04-09: 30 mg via SUBCUTANEOUS
  Filled 2020-04-09: qty 0.3

## 2020-04-09 MED ORDER — SODIUM BICARBONATE 8.4 % IV SOLN
INTRAVENOUS | Status: DC
  Filled 2020-04-09 (×6): qty 100

## 2020-04-09 MED ORDER — METOPROLOL SUCCINATE ER 50 MG PO TB24
50.0000 mg | ORAL_TABLET | Freq: Every day | ORAL | Status: DC
  Administered 2020-04-09 – 2020-04-13 (×4): 50 mg via ORAL
  Filled 2020-04-09 (×5): qty 1

## 2020-04-09 MED ORDER — HYDRALAZINE HCL 50 MG PO TABS
25.0000 mg | ORAL_TABLET | Freq: Three times a day (TID) | ORAL | Status: DC
  Administered 2020-04-09 – 2020-04-13 (×11): 25 mg via ORAL
  Filled 2020-04-09 (×13): qty 1

## 2020-04-09 MED ORDER — SODIUM ZIRCONIUM CYCLOSILICATE 10 G PO PACK
10.0000 g | PACK | Freq: Every day | ORAL | Status: DC
  Filled 2020-04-09: qty 1

## 2020-04-09 MED ORDER — ACETAMINOPHEN 650 MG RE SUPP: 650.0000 mg | Freq: Four times a day (QID) | RECTAL | Status: DC | PRN

## 2020-04-09 MED ORDER — ACETAMINOPHEN 325 MG PO TABS
650.0000 mg | ORAL_TABLET | Freq: Four times a day (QID) | ORAL | Status: DC | PRN
  Administered 2020-04-09 – 2020-04-10 (×2): 650 mg via ORAL
  Filled 2020-04-09 (×2): qty 2

## 2020-04-09 MED ORDER — ONDANSETRON HCL 4 MG PO TABS: 4.0000 mg | ORAL_TABLET | Freq: Four times a day (QID) | ORAL | Status: DC | PRN

## 2020-04-09 MED ORDER — ONDANSETRON HCL 4 MG/2ML IJ SOLN: 4.0000 mg | Freq: Four times a day (QID) | INTRAMUSCULAR | Status: DC | PRN

## 2020-04-09 MED ORDER — PANTOPRAZOLE SODIUM 40 MG PO TBEC
40.0000 mg | DELAYED_RELEASE_TABLET | Freq: Every day | ORAL | Status: DC
  Administered 2020-04-09 – 2020-04-13 (×5): 40 mg via ORAL
  Filled 2020-04-09 (×5): qty 1

## 2020-04-09 NOTE — ED Notes (Signed)
Meal tray provided.

## 2020-04-09 NOTE — Progress Notes (Signed)
Patient ID: Leah Davies, female   DOB: May 10, 1941, 79 y.o.   MRN: [id number] [facility] PROGRESS NOTE  Leah Davies JYN:[id number] DOB: [birth year] DOA: 04/08/2020 PCP: Freddy Finner, NP  HPI/Subjective: Interpreter used.  Patient came in with high blood pressure and headache.  Found to have worsening kidney function and hyperkalemia.  She feels okay right now.  Objective: Vitals:   04/09/20 0909 04/09/20 1430  BP: (!) 157/51 (!) 189/60  Pulse: 83 62  Resp:  20  Temp:    SpO2:  98%   No intake or output data in the 24 hours ending 04/09/20 1710 Filed Weights   04/08/20 1525  Weight: 70.3 kg    ROS: Review of Systems  Respiratory: Negative for shortness of breath.   Cardiovascular: Negative for chest pain.  Gastrointestinal: Negative for abdominal pain.  Neurological: Negative for headaches.   Exam: Physical Exam HENT:     Nose: No mucosal edema.     Mouth/Throat:     Pharynx: No oropharyngeal exudate.  Eyes:     General: Lids are normal.     Conjunctiva/sclera: Conjunctivae normal.     Pupils: Pupils are equal, round, and reactive to light.  Cardiovascular:     Rate and Rhythm: Normal rate and regular rhythm.     Heart sounds: S1 normal and S2 normal.  Pulmonary:     Effort: No respiratory distress.     Breath sounds: Examination of the right-lower field reveals decreased breath sounds. Examination of the left-lower field reveals decreased breath sounds. Decreased breath sounds present. No wheezing, rhonchi or rales.  Abdominal:     Palpations: Abdomen is soft.     Tenderness: There is no abdominal tenderness.  Musculoskeletal:     Right ankle: No swelling.     Left ankle: No swelling.  Skin:    General: Skin is warm.     Findings: No rash.  Neurological:     Mental Status: She is alert and oriented to person, place, and time.     Cranial Nerves: No cranial nerve deficit.       Data Reviewed: Basic Metabolic Panel: Recent Labs  Lab 04/08/20 1535  04/09/20 0302  NA 134* 138  K 6.0* 5.0  CL 108 110  CO2 19* 18*  GLUCOSE 108* 108*  BUN 85* 86*  CREATININE 5.64* 5.36*  CALCIUM 8.1* 8.3*   CBC: Recent Labs  Lab 04/08/20 1535 04/09/20 0302  WBC 7.4 7.2  HGB 9.0* 8.0*  HCT 27.8* 24.5*  MCV 88.0 87.5  PLT 190 230     Recent Results (from the past 240 hour(s))  SARS Coronavirus 2 by RT PCR (hospital order, performed in [facility] hospital lab) Nasopharyngeal Nasopharyngeal Swab     Status: None   Collection Time: 04/09/20  3:02 AM   Specimen: Nasopharyngeal Swab  Result Value Ref Range Status   SARS Coronavirus 2 NEGATIVE NEGATIVE Final    Comment: (NOTE) SARS-CoV-2 target nucleic acids are NOT DETECTED.  The SARS-CoV-2 RNA is generally detectable in upper and lower respiratory specimens during the acute phase of infection. The lowest concentration of SARS-CoV-2 viral copies this assay can detect is 250 copies / mL. A negative result does not preclude SARS-CoV-2 infection and should not be used as the sole basis for treatment or other patient management decisions.  A negative result may occur with improper specimen collection / handling, submission of specimen other than nasopharyngeal swab, presence of viral mutation(s) within the areas targeted by  this assay, and inadequate number of viral copies (<250 copies / mL). A negative result must be combined with clinical observations, patient history, and epidemiological information.  Fact Sheet for Patients:   StrictlyIdeas.no  Fact Sheet for Healthcare Providers: BankingDealers.co.za  This test is not yet approved or  cleared by the [place] FDA and has been authorized for detection and/or diagnosis of SARS-CoV-2 by FDA under an Emergency Use Authorization (EUA).  This EUA will remain in effect (meaning this test can be used) for the duration of the COVID-19 declaration under Section 564(b)(1) of the Act, 21  U.S.C. section 360bbb-3(b)(1), unless the authorization is terminated or revoked sooner.  Performed at [facility], [address] [place], [place] [address]      Studies: No results found.  Scheduled Meds:  enoxaparin (LOVENOX) injection  30 mg Subcutaneous Q24H   hydrALAZINE  25 mg Oral Q8H   metoprolol succinate  50 mg Oral Daily   pantoprazole  40 mg Oral Daily   [START ON 04/10/2020] sodium zirconium cyclosilicate  10 g Oral Daily   Continuous Infusions:   sodium bicarbonate  infusion 1000 mL 75 mL/hr at 04/09/20 0308    Assessment/Plan:  1. Acute kidney injury on chronic kidney disease stage V.  GFR in dialysis range.  Patient also has hyperkalemia which was treated with Lokelma.  Continue Lokelma.  Nephrology to speak to the patient on whether she wants to start dialysis.  Patient on sodium bicarb. 2. Headache secondary to elevated hypertension 3. Hypertensive urgency initially restarted oral medications.  Blood pressure trending better. 4. Anemia of chronic disease.  Hemoglobin 8.  Continue to monitor    Code Status:     Code Status Orders  (From admission, onward)         Start     Ordered   04/09/20 0136  Full code  Continuous        04/09/20 0139        Code Status History    Date Active Date Inactive Code Status Order ID Comments User Context   02/19/2020 2204 02/23/2020 2327 Full Code [id number]  Orene Desanctis, DO ED   10/07/2016 2205 10/09/2016 1722 Full Code [id number]  Bettey Costa, MD Inpatient   10/05/2015 1425 10/05/2015 1939 Full Code [id number]  Hessie Knows, MD Inpatient   Advance Care Planning Activity     Family Communication: Family at the bedside Disposition Plan: Status is: Observation  Dispo: The patient is from: Home              Anticipated d/c is to: Home              Anticipated d/c date is: Will be dependent on whether nephrology wants to start dialysis on this admission or not.  If they want her to start dialysis on this  admission will need 3 dialysis sessions in an outpatient slot prior to disposition.  If they do not want to start dialysis on this admission will check labs tomorrow morning and then decide upon disposition              Patient currently being treated for acute on chronic kidney injury and hyperkalemia.  Consultants:  Nephrology  Time spent: 28 minutes, via translator  [place]

## 2020-04-09 NOTE — ED Provider Notes (Signed)
[facility] Emergency Department Provider Note  ____________________________________________  Time seen: Approximately 12:37 AM  I have reviewed the triage vital signs and the nursing notes.   HISTORY  Chief Complaint Dizziness and Hypertension   HPI Leah Davies is a 79 y.o. female with a history of chronic kidney disease not on dialysis and, hypertension, hyperlipidemia, gout, GERD who presents for evaluation of elevated blood pressure.  Patient reports that she was feeling unwell today with a sensation of "inflammation of the head" associated with lightheadedness.  When she feels like that is usually because her blood pressure was elevated.  She took her blood pressure which was in the 200s and that prompted the visit to the emergency room.  She denies headache, chest pain, shortness of breath.  Patient has had several of her antihypertensive medications adjusted recently due to hypotension and hyperkalemia.  She is currently on hydralazine 50 mg twice daily and metoprolol once a day only.  She reports normal urine output.  No slurred speech, no facial droop, no unilateral weakness or numbness.   Past Medical History:  Diagnosis Date  . Chronic kidney disease    renal insufficiency  . Complication of anesthesia    Vital signs low during colonoscopy  . Elevated lipids   . GERD (gastroesophageal reflux disease)   . Gout   . Hypertension     Patient Active Problem List   Diagnosis Date Noted  . Acute renal failure superimposed on stage 5 chronic kidney disease, not on chronic dialysis ([place]) 02/23/2020  . Uncontrolled hypertension 02/23/2020  . Intractable vomiting with nausea 02/19/2020  . Hyperkalemia 02/19/2020  . Acute lower UTI 02/19/2020  . Essential hypertension 02/19/2020  . GERD (gastroesophageal reflux disease) 02/19/2020  . Anemia of chronic disease 02/19/2020  . Hypocalcemia 02/19/2020  . Varicose veins of both lower extremities with  inflammation 11/18/2016  . Pain in limb 11/18/2016  . Chronic venous insufficiency 11/18/2016  . Swelling of limb 11/18/2016    Past Surgical History:  Procedure Laterality Date  . BREAST EXCISIONAL BIOPSY Left 2010   benign  . BREAST SURGERY     Lt breast biopsy  . KNEE ARTHROSCOPY Left 10/05/2015   Procedure: ARTHROSCOPY KNEE partial medial and lateral meniscectomy, lateral release for sublux patella;  Surgeon: Hessie Knows, MD;  Location: [facility] ORS;  Service: Orthopedics;  Laterality: Left;    Prior to Admission medications   Medication Sig Start Date End Date Taking? Authorizing Provider  acetaminophen (TYLENOL) 500 MG tablet Take 500 mg by mouth as needed.    [provider]  atorvastatin (LIPITOR) 80 MG tablet 80 mg. 05/19/13   [provider]  hydrALAZINE (APRESOLINE) 100 MG tablet Take 1 tablet (100 mg total) by mouth every 8 (eight) hours. 02/23/20   Dhungel, Flonnie Overman, MD  isosorbide mononitrate (IMDUR) 30 MG 24 hr tablet Take 1 tablet (30 mg total) by mouth daily. 02/24/20   Dhungel, Flonnie Overman, MD  metoprolol succinate (TOPROL-XL) 50 MG 24 hr tablet Take 1 tablet (50 mg total) by mouth daily. Take with or immediately following a meal. 02/24/20   Dhungel, Nishant, MD  omeprazole (PRILOSEC) 40 MG capsule Take 40 mg by mouth daily as needed.     [provider]  tiZANidine (ZANAFLEX) 4 MG tablet Take 4 mg by mouth at bedtime. 10/05/16   [provider]    Allergies Amlodipine and Azithromycin  Family History  Problem Relation Age of Onset  . Breast cancer Sister 62  Social History Social History   Tobacco Use  . Smoking status: Never Smoker  . Smokeless tobacco: Never Used  Substance Use Topics  . Alcohol use: Yes    Comment: rare  . Drug use: No    Review of Systems  Constitutional: Negative for fever. + Lightheadedness Eyes: Negative for visual changes. ENT: Negative for sore throat. Neck: No neck pain  Cardiovascular: Negative  for chest pain. Respiratory: Negative for shortness of breath. Gastrointestinal: Negative for abdominal pain, vomiting or diarrhea. Genitourinary: Negative for dysuria. Musculoskeletal: Negative for back pain. Skin: Negative for rash. Neurological: Negative for headaches, weakness or numbness. Psych: No SI or HI  ____________________________________________   PHYSICAL EXAM:  VITAL SIGNS: Vitals:   04/08/20 1931 04/08/20 2139  BP: (!) 195/68 (!) 183/61  Pulse: 79 77  Resp: 18 18  Temp:    SpO2: 100% 99%    Constitutional: Alert and oriented. Well appearing and in no apparent distress. HEENT:      Head: Normocephalic and atraumatic.         Eyes: Conjunctivae are normal. Sclera is non-icteric.       Mouth/Throat: Mucous membranes are moist.       Neck: Supple with no signs of meningismus. Cardiovascular: Regular rate and rhythm. No murmurs, gallops, or rubs. 2+ symmetrical distal pulses are present in all extremities. No JVD. Respiratory: Normal respiratory effort. Lungs are clear to auscultation bilaterally. No wheezes, crackles, or rhonchi.  Gastrointestinal: Soft, non tender Musculoskeletal: Nontender with normal range of motion in all extremities. No edema, cyanosis, or erythema of extremities. Neurologic: Normal speech and language. Face is symmetric. Moving all extremities. No gross focal neurologic deficits are appreciated. Skin: Skin is warm, dry and intact. No rash noted. Psychiatric: Mood and affect are normal. Speech and behavior are normal.  ____________________________________________   LABS (all labs ordered are listed, but only abnormal results are displayed)  Labs Reviewed  BASIC METABOLIC PANEL - Abnormal; Notable for the following components:      Result Value   Sodium 134 (*)    Potassium 6.0 (*)    CO2 19 (*)    Glucose, Bld 108 (*)    BUN 85 (*)    Creatinine, Ser 5.64 (*)    Calcium 8.1 (*)    GFR calc non Af Amer 7 (*)    GFR calc Af Amer 8  (*)    All other components within normal limits  CBC - Abnormal; Notable for the following components:   RBC 3.16 (*)    Hemoglobin 9.0 (*)    HCT 27.8 (*)    All other components within normal limits  URINALYSIS, COMPLETE (UACMP) WITH MICROSCOPIC - Abnormal; Notable for the following components:   Color, Urine COLORLESS (*)    APPearance CLEAR (*)    Protein, ur 100 (*)    All other components within normal limits   ____________________________________________  EKG  ED ECG REPORT I, Rudene Re, the attending physician, personally viewed and interpreted this ECG.  Normal sinus rhythm, rate of 80, normal intervals, no peak T waves, normal axis, no ST elevations or depressions. ____________________________________________  RADIOLOGY  none  ____________________________________________   PROCEDURES  Procedure(s) performed:yes .1-3 Lead EKG Interpretation Performed by: Rudene Re, MD Authorized by: Rudene Re, MD     Interpretation: non-specific     ECG rate assessment: normal     Rhythm: sinus rhythm     Ectopy: none     Critical Care performed:  None ____________________________________________  INITIAL IMPRESSION / ASSESSMENT AND PLAN / ED COURSE  79 y.o. female with a history of chronic kidney disease not on dialysis and, hypertension, hyperlipidemia, gout, GERD who presents for evaluation of elevated blood pressure.  History is gathered from patient and her son was at bedside.  Old medical records reviewed.  Several recent changes on her antihypertensives.  Patient is well-appearing with systolics between 459 and 205.  She is neurologically intact.  No chest pain or shortness of breath.  EKG showing no acute ischemic changes.  Labs showing hyperkalemia (K = 6) with no EKG changes.  Stable creatinine at 5.6.  Will give a dose of IV Lasix and Lokelma and bring patient in for further management since he will take find balance to be able to get  her pressure under control, electrolytes under control without worsening her kidney function.  Patient placed on telemetry for close monitoring.       _____________________________________________ Please note:  Patient was evaluated in Emergency Department today for the symptoms described in the history of present illness. Patient was evaluated in the context of the global COVID-19 pandemic, which necessitated consideration that the patient might be at risk for infection with the SARS-CoV-2 virus that causes COVID-19. Institutional protocols and algorithms that pertain to the evaluation of patients at risk for COVID-19 are in a state of rapid change based on information released by regulatory bodies including the CDC and federal and state organizations. These policies and algorithms were followed during the patient's care in the ED.  Some ED evaluations and interventions may be delayed as a result of limited staffing during the pandemic.   [place] Controlled Substance Database was reviewed by me. ____________________________________________   FINAL CLINICAL IMPRESSION(S) / ED DIAGNOSES   Final diagnoses:  Hyperkalemia  Malignant hypertension      NEW MEDICATIONS STARTED DURING THIS VISIT:  ED Discharge Orders    None       Note:  This document was prepared using Dragon voice recognition software and may include unintentional dictation errors.    Alfred Levins, [place], MD 04/09/20 [phone]

## 2020-04-09 NOTE — H&P (Signed)
History and Physical    Leah Davies:[id number] DOB: Sep 13, 1941 DOA: 04/08/2020  PCP: Freddy Finner, NP   Patient coming from: home  I have personally briefly reviewed patient's old medical records in [place]  Chief Complaint: Dizziness  HPI: Leah Davies is a 79 y.o. 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  She checked her blood pressure with systolic over 497 and presented to the emergency room.  She denied chest pains or shortness of breath.  Denies one-sided weakness numbness tingling.  Had a mild headache, denies blurred vision.  She states that her blood pressure has been difficult control in the outpatient and has had several recent medication adjustments ED Course: On arrival, blood pressure was 195/68 with otherwise normal vitals.  EKG showed normal sinus rhythm.  Blood work significant for creatinine 5.64, above baseline of 5, with bicarb of 19 and potassium of 6.  Hemoglobin 9 which is about her baseline.  Urinalysis unremarkable.  Patient was treated with [facility] in the emergency room.  Hospitalist consulted for admission.  Review of Systems: As per HPI otherwise all other systems on review of systems negative.    Past Medical History:  Diagnosis Date   Chronic kidney disease    renal insufficiency   Complication of anesthesia    Vital signs low during colonoscopy   Elevated lipids    GERD (gastroesophageal reflux disease)    Gout    Hypertension     Past Surgical History:  Procedure Laterality Date   BREAST EXCISIONAL BIOPSY Left 2010   benign   BREAST SURGERY     Lt breast biopsy   KNEE ARTHROSCOPY Left 10/05/2015   Procedure: ARTHROSCOPY KNEE partial medial and lateral meniscectomy, lateral release for sublux patella;  Surgeon: Hessie Knows, MD;   Location: [facility] ORS;  Service: Orthopedics;  Laterality: Left;     reports that she has never smoked. She has never used smokeless tobacco. She reports current alcohol use. She reports that she does not use drugs.  Allergies  Allergen Reactions   Amlodipine     Other reaction(s): Unknown   Azithromycin     Other reaction(s): Unknown    Family History  Problem Relation Age of Onset   Breast cancer Sister 42      Prior to Admission medications   Medication Sig Start Date End Date Taking? Authorizing Provider  acetaminophen (TYLENOL) 500 MG tablet Take 500 mg by mouth as needed.    [provider]  atorvastatin (LIPITOR) 80 MG tablet 80 mg. 05/19/13   [provider]  hydrALAZINE (APRESOLINE) 100 MG tablet Take 1 tablet (100 mg total) by mouth every 8 (eight) hours. 02/23/20   Dhungel, Flonnie Overman, MD  isosorbide mononitrate (IMDUR) 30 MG 24 hr tablet Take 1 tablet (30 mg total) by mouth daily. 02/24/20   Dhungel, Flonnie Overman, MD  metoprolol succinate (TOPROL-XL) 50 MG 24 hr tablet Take 1 tablet (50 mg total) by mouth daily. Take with or immediately following a meal. 02/24/20   Dhungel, Nishant, MD  omeprazole (PRILOSEC) 40 MG capsule Take 40 mg by mouth daily as needed.     [provider]  tiZANidine (ZANAFLEX) 4 MG tablet Take 4 mg by mouth at bedtime. 10/05/16   [provider]    Physical Exam: Vitals:  04/08/20 2352 04/09/20 0010 04/09/20 0030 04/09/20 0100  BP: (!) 206/71 (!) 178/64 (!) 182/60 (!) 189/69  Pulse: 77 72 73 76  Resp: 17 16 19 20   Temp:      TempSrc:      SpO2: 99% 99% 99% 100%  Weight:         Vitals:   04/08/20 2352 04/09/20 0010 04/09/20 0030 04/09/20 0100  BP: (!) 206/71 (!) 178/64 (!) 182/60 (!) 189/69  Pulse: 77 72 73 76  Resp: 17 16 19 20   Temp:      TempSrc:      SpO2: 99% 99% 99% 100%  Weight:          Constitutional: Alert and oriented x 3 . Not in any apparent distress HEENT:      Head: Normocephalic and  atraumatic.         Eyes: PERLA, EOMI, Conjunctivae are normal. Sclera is non-icteric.       Mouth/Throat: Mucous membranes are moist.       Neck: Supple with no signs of meningismus. Cardiovascular: Regular rate and rhythm. No murmurs, gallops, or rubs. 2+ symmetrical distal pulses are present . No JVD. No LE edema Respiratory: Respiratory effort normal .Lungs sounds clear bilaterally. No wheezes, crackles, or rhonchi.  Gastrointestinal: Soft, non tender, and non distended with positive bowel sounds. No rebound or guarding. Genitourinary: No CVA tenderness. Musculoskeletal: Nontender with normal range of motion in all extremities. No cyanosis, or erythema of extremities. Neurologic: Normal speech and language. Face is symmetric. Moving all extremities. No gross focal neurologic deficits . Skin: Skin is warm, dry.  No rash or ulcers Psychiatric: Mood and affect are normal Speech and behavior are normal   Labs on Admission: I have personally reviewed following labs and imaging studies  CBC: Recent Labs  Lab 04/08/20 1535  WBC 7.4  HGB 9.0*  HCT 27.8*  MCV 88.0  PLT 283   Basic Metabolic Panel: Recent Labs  Lab 04/08/20 1535  NA 134*  K 6.0*  CL 108  CO2 19*  GLUCOSE 108*  BUN 85*  CREATININE 5.64*  CALCIUM 8.1*   GFR: Estimated Creatinine Clearance: 7.1 mL/min (A) (by C-G formula based on SCr of 5.64 mg/dL (H)). Liver Function Tests: No results for input(s): AST, ALT, ALKPHOS, BILITOT, PROT, ALBUMIN in the last 168 hours. No results for input(s): LIPASE, AMYLASE in the last 168 hours. No results for input(s): AMMONIA in the last 168 hours. Coagulation Profile: No results for input(s): INR, PROTIME in the last 168 hours. Cardiac Enzymes: No results for input(s): CKTOTAL, CKMB, CKMBINDEX, TROPONINI in the last 168 hours. BNP (last 3 results) No results for input(s): PROBNP in the last 8760 hours. HbA1C: No results for input(s): HGBA1C in the last 72 hours. CBG: No  results for input(s): GLUCAP in the last 168 hours. Lipid Profile: No results for input(s): CHOL, HDL, LDLCALC, TRIG, CHOLHDL, LDLDIRECT in the last 72 hours. Thyroid Function Tests: No results for input(s): TSH, T4TOTAL, FREET4, T3FREE, THYROIDAB in the last 72 hours. Anemia Panel: No results for input(s): VITAMINB12, FOLATE, FERRITIN, TIBC, IRON, RETICCTPCT in the last 72 hours. Urine analysis:    Component Value Date/Time   COLORURINE COLORLESS (A) 04/08/2020 1535   APPEARANCEUR CLEAR (A) 04/08/2020 1535   LABSPEC 1.006 04/08/2020 1535   PHURINE 5.0 04/08/2020 1535   GLUCOSEU NEGATIVE 04/08/2020 1535   HGBUR NEGATIVE 04/08/2020 1535   BILIRUBINUR NEGATIVE 04/08/2020 1535   KETONESUR NEGATIVE 04/08/2020 1535   PROTEINUR  100 (A) 04/08/2020 1535   NITRITE NEGATIVE 04/08/2020 1535   LEUKOCYTESUR NEGATIVE 04/08/2020 1535    Radiological Exams on Admission: No results found.  EKG: Independently reviewed. Interpretation : Normal sinus rhythm with no acute ST-T wave changes.  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  No peaking of T waves -Received Lokelma in the emergency room, as well as a dose of Lasix 20 -Followed outpatient by nephrology -IV hydration with bicarb -Nephrology consult requested    Anemia of chronic kidney disease -Hemoglobin of 9 which is her baseline    Hypertensive urgency, with history of labile blood pressure -SBP at home over 200.  195 by arrival -Given patient reported labile hypertension, will start with low-dose hydralazine 25, 3 times daily and continue home  metoprolol     DVT prophylaxis: Renally adjusted Lovenox Code Status: full code  Family Communication:  none  Disposition Plan: Back to previous home environment Consults called: Nephrology Status: Observation    Athena Masse MD [facility]     04/09/2020, 1:40 AM

## 2020-04-09 NOTE — Consult Note (Signed)
[facility] CONSULT NOTE    Date: 04/09/2020                  Patient Name:  Leah Davies  MRN: [id number]  DOB: September 30, 1941  Age / Sex: 79 y.o., female         PCP: Freddy Finner, NP                 Service Requesting Consult:  Hospitalist                 Reason for Consult:  Evaluation management of chronic kidney disease stage V and hypertension            History of Present Illness: Patient is a 79 y.o. female with a PMHx of chronic kidney disease stage V seen both by [facility] nephrology as well as [facility], anemia of chronic kidney disease, hyperlipidemia, GERD, gout, hypertension, who was admitted to [facility] on 04/08/2020 for evaluation of dizziness and headache.  At home the patient noticed systolic blood pressure greater than 200.  Upon arrival here her blood pressure was 195/68.  Her creatinine was found to be 5.64 with an EGFR of 7.  She is following with [facility] nephrology as well as [facility] recently.  She has been told that she would likely need renal placement therapy in the relative near future.  She does not have an underlying AV access in place.  Interview was conducted with the aid of an online interpreter.   Medications: Outpatient medications: Medications Prior to Admission  Medication Sig Dispense Refill Last Dose  . acetaminophen (TYLENOL) 500 MG tablet Take 500 mg by mouth as needed.   prn at prn  . alum & mag hydroxide-simeth (MAALOX/MYLANTA) 200-200-20 MG/5ML suspension Take 5 mLs by mouth every 6 (six) hours as needed.   prn at prn  . colchicine 0.6 MG tablet Take 0.6 mg by mouth daily as needed.   prn at prn  . hydrALAZINE (APRESOLINE) 100 MG tablet Take 1 tablet (100 mg total) by mouth every 8 (eight) hours. 90 tablet 1 04/08/2020 at 1800  . isosorbide mononitrate (IMDUR) 30 MG 24 hr tablet Take 1 tablet (30 mg total) by mouth daily. 30 tablet 1 04/08/2020 at 1000  . Omeprazole 20 MG TBEC Take 20 mg by mouth daily as needed.    prn at  prn  . aliskiren (TEKTURNA) 150 MG tablet Take 150 mg by mouth daily. (Patient not taking: Reported on 04/09/2020)   Not Taking at Unknown time  . atorvastatin (LIPITOR) 80 MG tablet 80 mg. (Patient not taking: Reported on 04/09/2020)   Not Taking at Unknown time  . metoprolol succinate (TOPROL-XL) 50 MG 24 hr tablet Take 1 tablet (50 mg total) by mouth daily. Take with or immediately following a meal. (Patient not taking: Reported on 04/09/2020) 30 tablet 1 Not Taking at Unknown time  . tiZANidine (ZANAFLEX) 4 MG tablet Take 4 mg by mouth at bedtime. (Patient not taking: Reported on 04/09/2020)   Not Taking at Unknown time    Current medications: Current Facility-Administered Medications  Medication Dose Route Frequency Provider Last Rate Last Admin  . acetaminophen (TYLENOL) tablet 650 mg  650 mg Oral Q6H PRN Athena Masse, MD       Or  . acetaminophen (TYLENOL) suppository 650 mg  650 mg Rectal Q6H PRN Athena Masse, MD      . enoxaparin (LOVENOX) injection 30 mg  30 mg Subcutaneous Q24H  Athena Masse, MD   30 mg at 04/09/20 0303  . hydrALAZINE (APRESOLINE) tablet 25 mg  25 mg Oral Q8H Athena Masse, MD   25 mg at 04/09/20 1431  . metoprolol succinate (TOPROL-XL) 24 hr tablet 50 mg  50 mg Oral Daily Athena Masse, MD   50 mg at 04/09/20 0907  . ondansetron (ZOFRAN) tablet 4 mg  4 mg Oral Q6H PRN Athena Masse, MD       Or  . ondansetron [facility]) injection 4 mg  4 mg Intravenous Q6H PRN Athena Masse, MD      . pantoprazole (PROTONIX) EC tablet 40 mg  40 mg Oral Daily Loletha Grayer, MD   40 mg at 04/09/20 1431  . sodium bicarbonate 100 mEq in dextrose 5 % 1,000 mL infusion   Intravenous Continuous Athena Masse, MD 75 mL/hr at 04/09/20 1800 Rate Verify at 04/09/20 1800  . [START ON 04/10/2020] sodium zirconium cyclosilicate (LOKELMA) packet 10 g  10 g Oral Daily Loletha Grayer, MD          Allergies: Allergies  Allergen Reactions  . Amlodipine     Other reaction(s):  Unknown  . Azithromycin     Other reaction(s): Unknown      Past Medical History: Past Medical History:  Diagnosis Date  . Chronic kidney disease    renal insufficiency  . Complication of anesthesia    Vital signs low during colonoscopy  . Elevated lipids   . GERD (gastroesophageal reflux disease)   . Gout   . Hypertension      Past Surgical History: Past Surgical History:  Procedure Laterality Date  . BREAST EXCISIONAL BIOPSY Left 2010   benign  . BREAST SURGERY     Lt breast biopsy  . KNEE ARTHROSCOPY Left 10/05/2015   Procedure: ARTHROSCOPY KNEE partial medial and lateral meniscectomy, lateral release for sublux patella;  Surgeon: Hessie Knows, MD;  Location: [facility] ORS;  Service: Orthopedics;  Laterality: Left;     Family History: Family History  Problem Relation Age of Onset  . Breast cancer Sister 36     Social History: Social History   Socioeconomic History  . Marital status: Single    Spouse name: Not on file  . Number of children: Not on file  . Years of education: Not on file  . Highest education level: Not on file  Occupational History  . Not on file  Tobacco Use  . Smoking status: Never Smoker  . Smokeless tobacco: Never Used  Substance and Sexual Activity  . Alcohol use: Yes    Comment: rare  . Drug use: No  . Sexual activity: Not on file  Other Topics Concern  . Not on file  Social History Narrative  . Not on file   Social Determinants of Health   Financial Resource Strain:   . Difficulty of Paying Living Expenses:   Food Insecurity:   . Worried About Charity fundraiser in the Last Year:   . Arboriculturist in the Last Year:   Transportation Needs:   . Film/video editor (Medical):   Marland Kitchen Lack of Transportation (Non-Medical):   Physical Activity:   . Days of Exercise per Week:   . Minutes of Exercise per Session:   Stress:   . Feeling of Stress :   Social Connections:   . Frequency of Communication with Friends and Family:    . Frequency of Social Gatherings with Friends and Family:   .  Attends Religious Services:   . Active Member of Clubs or Organizations:   . Attends Archivist Meetings:   Marland Kitchen Marital Status:   Intimate Partner Violence:   . Fear of Current or Ex-Partner:   . Emotionally Abused:   Marland Kitchen Physically Abused:   . Sexually Abused:      Review of Systems: Review of Systems  Constitutional: Positive for malaise/fatigue. Negative for chills and fever.  HENT: Negative for congestion, hearing loss and tinnitus.   Eyes: Negative for blurred vision and double vision.  Respiratory: Negative for cough, sputum production and shortness of breath.   Cardiovascular: Negative for chest pain, palpitations and orthopnea.  Gastrointestinal: Negative for diarrhea, nausea and vomiting.  Genitourinary: Negative for dysuria, frequency and urgency.  Musculoskeletal: Negative for myalgias.  Skin: Negative for itching and rash.  Neurological: Positive for dizziness and headaches. Negative for focal weakness.  Endo/Heme/Allergies: Negative for polydipsia. Does not bruise/bleed easily.  Psychiatric/Behavioral: Negative for depression. The patient is not nervous/anxious.      Vital Signs: Blood pressure (!) 189/60, pulse 62, temperature 98.5 F (36.9 C), temperature source Oral, resp. rate 20, weight 70.3 kg, SpO2 98 %.  Weight trends: Filed Weights   04/08/20 1525  Weight: 70.3 kg    Physical Exam: General: NAD, laying in bed comfortably  Head: Normocephalic, atraumatic.  Eyes: Anicteric, EOMI  Nose: Mucous membranes moist, not inflammed, nonerythematous.  Throat: Oropharynx nonerythematous, no exudate appreciated.   Neck: Supple, trachea midline.  Lungs:  Normal respiratory effort. Clear to auscultation BL without crackles or wheezes.  Heart: RRR. S1 and S2 normal without gallop, murmur, or rubs.  Abdomen:  BS normoactive. Soft, Nondistended, non-tender.  No masses or organomegaly.   Extremities: No pretibial edema.  Neurologic: A&O X3, Motor strength is 5/5 in the all 4 extremities  Skin: No visible rashes, scars.    Lab results: Basic Metabolic Panel: Recent Labs  Lab 04/08/20 1535 04/09/20 0302  NA 134* 138  K 6.0* 5.0  CL 108 110  CO2 19* 18*  GLUCOSE 108* 108*  BUN 85* 86*  CREATININE 5.64* 5.36*  CALCIUM 8.1* 8.3*    Liver Function Tests: No results for input(s): AST, ALT, ALKPHOS, BILITOT, PROT, ALBUMIN in the last 168 hours. No results for input(s): LIPASE, AMYLASE in the last 168 hours. No results for input(s): AMMONIA in the last 168 hours.  CBC: Recent Labs  Lab 04/08/20 1535 04/09/20 0302  WBC 7.4 7.2  HGB 9.0* 8.0*  HCT 27.8* 24.5*  MCV 88.0 87.5  PLT 190 230    Cardiac Enzymes: No results for input(s): CKTOTAL, CKMB, CKMBINDEX, TROPONINI in the last 168 hours.  BNP: Invalid input(s): POCBNP  CBG: No results for input(s): GLUCAP in the last 168 hours.  Microbiology: Results for orders placed or performed during the hospital encounter of 04/08/20  SARS Coronavirus 2 by RT PCR (hospital order, performed in [place] hospital lab) Nasopharyngeal Nasopharyngeal Swab     Status: None   Collection Time: 04/09/20  3:02 AM   Specimen: Nasopharyngeal Swab  Result Value Ref Range Status   SARS Coronavirus 2 NEGATIVE NEGATIVE Final    Comment: (NOTE) SARS-CoV-2 target nucleic acids are NOT DETECTED.  The SARS-CoV-2 RNA is generally detectable in upper and lower respiratory specimens during the acute phase of infection. The lowest concentration of SARS-CoV-2 viral copies this assay can detect is 250 copies / mL. A negative result does not preclude SARS-CoV-2 infection and should not be used as  the sole basis for treatment or other patient management decisions.  A negative result may occur with improper specimen collection / handling, submission of specimen other than nasopharyngeal swab, presence of viral mutation(s) within  the areas targeted by this assay, and inadequate number of viral copies (<250 copies / mL). A negative result must be combined with clinical observations, patient history, and epidemiological information.  Fact Sheet for Patients:   StrictlyIdeas.no  Fact Sheet for Healthcare Providers: BankingDealers.co.za  This test is not yet approved or  cleared by the [place] FDA and has been authorized for detection and/or diagnosis of SARS-CoV-2 by FDA under an Emergency Use Authorization (EUA).  This EUA will remain in effect (meaning this test can be used) for the duration of the COVID-19 declaration under Section 564(b)(1) of the Act, 21 U.S.C. section 360bbb-3(b)(1), unless the authorization is terminated or revoked sooner.  Performed at [facility], [address] [place], [place] [address]     Coagulation Studies: No results for input(s): LABPROT, INR in the last 72 hours.  Urinalysis: Recent Labs    04/08/20 1535  COLORURINE COLORLESS*  LABSPEC 1.006  PHURINE 5.0  GLUCOSEU NEGATIVE  HGBUR NEGATIVE  BILIRUBINUR NEGATIVE  KETONESUR NEGATIVE  PROTEINUR 100*  NITRITE NEGATIVE  LEUKOCYTESUR NEGATIVE      Imaging:  No results found.   Assessment & Plan: Pt is a 79 y.o. female with a PMHx of chronic kidney disease stage V seen both by [facility] nephrology as well as [facility], anemia of chronic kidney disease, hyperlipidemia, GERD, gout, hypertension, who was admitted to [facility] on 04/08/2020 for evaluation of dizziness and headache.   1.  Chronic kidney disease stage V.  Patient has been followed both by [facility] nephrology as well as [place] nephrology recently.  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  We spent considerable time today talking about the need for renal replacement therapy given difficult to control hypertension.  We have recommended initiation of  renal replacement therapy.  Therefore we will request vascular surgery to place PermCath.  She is also considering the possibility of peritoneal dialysis.  Patient and her family were informed regarding this.    2.  Hypertension.  Blood pressure still quite high.  She is currently on hydralazine and metoprolol.  We will discontinue sodium bicarbonate infusion as this may be contributing to her hypertension now.  Patient with allergy to amlodipine.  3.  Anemia of chronic kidney disease.  Hemoglobin currently 8.0.  Consider Epogen once we start the patient on dialysis.  4.  Secondary hyperparathyroidism.  We will more fully evaluate bone mineral metabolism parameters.

## 2020-04-10 ENCOUNTER — Inpatient Hospital Stay
Admission: EM | Disposition: A | Discharge status: Home / Self Care | Payer: Self-pay | Source: Home / Self Care | Attending: Internal Medicine

## 2020-04-10 ENCOUNTER — Other Ambulatory Visit (INDEPENDENT_AMBULATORY_CARE_PROVIDER_SITE_OTHER): Payer: Self-pay | Admitting: Vascular Surgery

## 2020-04-10 DIAGNOSIS — R519 Headache, unspecified: Secondary | ICD-10-CM | POA: Diagnosis not present

## 2020-04-10 DIAGNOSIS — Z888 Allergy status to other drugs, medicaments and biological substances status: Secondary | ICD-10-CM | POA: Diagnosis not present

## 2020-04-10 DIAGNOSIS — I120 Hypertensive chronic kidney disease with stage 5 chronic kidney disease or end stage renal disease: Secondary | ICD-10-CM | POA: Diagnosis present

## 2020-04-10 DIAGNOSIS — R42 Dizziness and giddiness: Secondary | ICD-10-CM | POA: Diagnosis present

## 2020-04-10 DIAGNOSIS — K219 Gastro-esophageal reflux disease without esophagitis: Secondary | ICD-10-CM | POA: Diagnosis present

## 2020-04-10 DIAGNOSIS — Z79899 Other long term (current) drug therapy: Secondary | ICD-10-CM | POA: Diagnosis not present

## 2020-04-10 DIAGNOSIS — E785 Hyperlipidemia, unspecified: Secondary | ICD-10-CM | POA: Diagnosis present

## 2020-04-10 DIAGNOSIS — R0982 Postnasal drip: Secondary | ICD-10-CM | POA: Diagnosis present

## 2020-04-10 DIAGNOSIS — M109 Gout, unspecified: Secondary | ICD-10-CM | POA: Diagnosis present

## 2020-04-10 DIAGNOSIS — D638 Anemia in other chronic diseases classified elsewhere: Secondary | ICD-10-CM | POA: Diagnosis not present

## 2020-04-10 DIAGNOSIS — E875 Hyperkalemia: Secondary | ICD-10-CM | POA: Diagnosis not present

## 2020-04-10 DIAGNOSIS — Z20822 Contact with and (suspected) exposure to covid-19: Secondary | ICD-10-CM | POA: Diagnosis present

## 2020-04-10 DIAGNOSIS — R202 Paresthesia of skin: Secondary | ICD-10-CM | POA: Diagnosis present

## 2020-04-10 DIAGNOSIS — I160 Hypertensive urgency: Secondary | ICD-10-CM | POA: Diagnosis present

## 2020-04-10 DIAGNOSIS — D631 Anemia in chronic kidney disease: Secondary | ICD-10-CM | POA: Diagnosis present

## 2020-04-10 DIAGNOSIS — N2581 Secondary hyperparathyroidism of renal origin: Secondary | ICD-10-CM | POA: Diagnosis present

## 2020-04-10 DIAGNOSIS — Z803 Family history of malignant neoplasm of breast: Secondary | ICD-10-CM | POA: Diagnosis not present

## 2020-04-10 DIAGNOSIS — N186 End stage renal disease: Secondary | ICD-10-CM

## 2020-04-10 DIAGNOSIS — K5900 Constipation, unspecified: Secondary | ICD-10-CM | POA: Diagnosis present

## 2020-04-10 DIAGNOSIS — N179 Acute kidney failure, unspecified: Secondary | ICD-10-CM | POA: Diagnosis present

## 2020-04-10 DIAGNOSIS — D696 Thrombocytopenia, unspecified: Secondary | ICD-10-CM | POA: Diagnosis present

## 2020-04-10 DIAGNOSIS — I10 Essential (primary) hypertension: Secondary | ICD-10-CM | POA: Diagnosis not present

## 2020-04-10 DIAGNOSIS — R0989 Other specified symptoms and signs involving the circulatory and respiratory systems: Secondary | ICD-10-CM | POA: Diagnosis present

## 2020-04-10 DIAGNOSIS — N185 Chronic kidney disease, stage 5: Secondary | ICD-10-CM | POA: Diagnosis not present

## 2020-04-10 DIAGNOSIS — E872 Acidosis: Secondary | ICD-10-CM | POA: Diagnosis present

## 2020-04-10 HISTORY — PX: DIALYSIS/PERMA CATHETER INSERTION: CATH118288

## 2020-04-10 LAB — BASIC METABOLIC PANEL
Anion gap: 10 (ref 5–15)
BUN: 75 mg/dL — ABNORMAL HIGH (ref 8–23)
CO2: 28 mmol/L (ref 22–32)
Calcium: 7.9 mg/dL — ABNORMAL LOW (ref 8.9–10.3)
Chloride: 102 mmol/L (ref 98–111)
Creatinine, Ser: 5.19 mg/dL — ABNORMAL HIGH (ref 0.44–1.00)
GFR calc Af Amer: 9 mL/min — ABNORMAL LOW (ref >60)
GFR calc non Af Amer: 7 mL/min — ABNORMAL LOW (ref >60)
Glucose, Bld: 115 mg/dL — ABNORMAL HIGH (ref 70–99)
Potassium: 4.3 mmol/L (ref 3.5–5.1)
Sodium: 140 mmol/L (ref 135–145)

## 2020-04-10 LAB — HEPATITIS B CORE ANTIBODY, TOTAL: Hep B Core Total Ab: NONREACTIVE

## 2020-04-10 LAB — PHOSPHORUS: Phosphorus: 4.1 mg/dL (ref 2.5–4.6)

## 2020-04-10 SURGERY — DIALYSIS/PERMA CATHETER INSERTION
Anesthesia: Moderate Sedation

## 2020-04-10 MED ORDER — LIDOCAINE HCL (PF) 1 % IJ SOLN
5.0000 mL | INTRAMUSCULAR | Status: DC | PRN
  Filled 2020-04-10: qty 5

## 2020-04-10 MED ORDER — MIDAZOLAM HCL 5 MG/5ML IJ SOLN
INTRAMUSCULAR | Status: AC
  Filled 2020-04-10: qty 5

## 2020-04-10 MED ORDER — HEPARIN SODIUM (PORCINE) 5000 UNIT/ML IJ SOLN
5000.0000 [IU] | Freq: Three times a day (TID) | INTRAMUSCULAR | Status: DC
  Administered 2020-04-11 – 2020-04-13 (×7): 5000 [IU] via SUBCUTANEOUS
  Filled 2020-04-10 (×7): qty 1

## 2020-04-10 MED ORDER — HEPARIN SODIUM (PORCINE) 10000 UNIT/ML IJ SOLN
INTRAMUSCULAR | Status: AC
  Filled 2020-04-10: qty 1

## 2020-04-10 MED ORDER — FENTANYL CITRATE (PF) 100 MCG/2ML IJ SOLN
INTRAMUSCULAR | Status: DC | PRN
  Administered 2020-04-10: 50 ug via INTRAVENOUS

## 2020-04-10 MED ORDER — SODIUM CHLORIDE 0.9 % IV SOLN: 100.0000 mL | INTRAVENOUS | Status: DC | PRN

## 2020-04-10 MED ORDER — FLUTICASONE PROPIONATE 50 MCG/ACT NA SUSP
2.0000 | Freq: Every day | NASAL | Status: DC
  Administered 2020-04-11 – 2020-04-13 (×3): 2 via NASAL
  Filled 2020-04-10: qty 16

## 2020-04-10 MED ORDER — ONDANSETRON HCL 4 MG/2ML IJ SOLN: 4.0000 mg | Freq: Four times a day (QID) | INTRAMUSCULAR | Status: DC | PRN

## 2020-04-10 MED ORDER — LIDOCAINE-PRILOCAINE 2.5-2.5 % EX CREA
1.0000 "application " | TOPICAL_CREAM | CUTANEOUS | Status: DC | PRN
  Filled 2020-04-10: qty 5

## 2020-04-10 MED ORDER — HYDRALAZINE HCL 20 MG/ML IJ SOLN
10.0000 mg | Freq: Once | INTRAMUSCULAR | Status: AC
  Administered 2020-04-10: 12:00:00 10 mg via INTRAVENOUS
  Filled 2020-04-10: qty 1

## 2020-04-10 MED ORDER — CHLORHEXIDINE GLUCONATE CLOTH 2 % EX PADS
6.0000 | MEDICATED_PAD | Freq: Every day | CUTANEOUS | Status: DC
  Administered 2020-04-10 – 2020-04-13 (×3): 6 via TOPICAL

## 2020-04-10 MED ORDER — ONDANSETRON HCL 4 MG/2ML IJ SOLN
INTRAMUSCULAR | Status: AC
  Administered 2020-04-10: 4 mg via INTRAVENOUS
  Filled 2020-04-10: qty 2

## 2020-04-10 MED ORDER — DIPHENHYDRAMINE HCL 50 MG/ML IJ SOLN: 50.0000 mg | Freq: Once | INTRAMUSCULAR | Status: DC | PRN

## 2020-04-10 MED ORDER — HEPARIN SODIUM (PORCINE) 1000 UNIT/ML DIALYSIS
1000.0000 [IU] | INTRAMUSCULAR | Status: DC | PRN
  Administered 2020-04-12: 1000 [IU] via INTRAVENOUS_CENTRAL
  Filled 2020-04-10 (×2): qty 1

## 2020-04-10 MED ORDER — ALTEPLASE 2 MG IJ SOLR
2.0000 mg | Freq: Once | INTRAMUSCULAR | Status: DC | PRN
  Filled 2020-04-10: qty 2

## 2020-04-10 MED ORDER — PENTAFLUOROPROP-TETRAFLUOROETH EX AERO
1.0000 "application " | INHALATION_SPRAY | CUTANEOUS | Status: DC | PRN
  Filled 2020-04-10: qty 30

## 2020-04-10 MED ORDER — MIDAZOLAM HCL 2 MG/ML PO SYRP: 8.0000 mg | ORAL_SOLUTION | Freq: Once | ORAL | Status: DC | PRN

## 2020-04-10 MED ORDER — FAMOTIDINE 20 MG PO TABS: 40.0000 mg | ORAL_TABLET | Freq: Once | ORAL | Status: DC | PRN

## 2020-04-10 MED ORDER — FENTANYL CITRATE (PF) 100 MCG/2ML IJ SOLN
INTRAMUSCULAR | Status: AC
  Filled 2020-04-10: qty 2

## 2020-04-10 MED ORDER — SODIUM CHLORIDE 0.9 % IV SOLN: INTRAVENOUS | Status: DC

## 2020-04-10 MED ORDER — HYDROMORPHONE HCL 1 MG/ML IJ SOLN: 1.0000 mg | Freq: Once | INTRAMUSCULAR | Status: DC | PRN

## 2020-04-10 MED ORDER — METHYLPREDNISOLONE SODIUM SUCC 125 MG IJ SOLR: 125.0000 mg | Freq: Once | INTRAMUSCULAR | Status: DC | PRN

## 2020-04-10 MED ORDER — CEFAZOLIN SODIUM-DEXTROSE 1-4 GM/50ML-% IV SOLN
1.0000 g | Freq: Once | INTRAVENOUS | Status: AC
  Administered 2020-04-10: 1 g via INTRAVENOUS

## 2020-04-10 MED ORDER — MIDAZOLAM HCL 2 MG/2ML IJ SOLN
INTRAMUSCULAR | Status: DC | PRN
  Administered 2020-04-10: 2 mg via INTRAVENOUS

## 2020-04-10 SURGICAL SUPPLY — 9 items
CATH CANNON HEMO 15FR 19 (HEMODIALYSIS SUPPLIES) ×2 IMPLANT
DERMABOND ADVANCED (GAUZE/BANDAGES/DRESSINGS) ×2
DERMABOND ADVANCED .7 DNX12 (GAUZE/BANDAGES/DRESSINGS) IMPLANT
PACK ANGIOGRAPHY (CUSTOM PROCEDURE TRAY) ×2 IMPLANT
SUT MNCRL 4-0 (SUTURE) ×2
SUT MNCRL 4-0 27XMFL (SUTURE) ×1
SUT PROLENE 0 CT 1 30 (SUTURE) ×2 IMPLANT
SUTURE MNCRL 4-0 27XMF (SUTURE) IMPLANT
TOWEL OR 17X26 4PK STRL BLUE (TOWEL DISPOSABLE) ×2 IMPLANT

## 2020-04-10 NOTE — Progress Notes (Signed)
Pt returned to unit from dialysis. Pt complains of nausea and pain at new perma cath site. Stable condition.

## 2020-04-10 NOTE — Progress Notes (Signed)
Pt transported off unit in stable condition with daughter at bedside to Vascular for permacath insertion.

## 2020-04-10 NOTE — Progress Notes (Signed)
[facility]  ROUNDING NOTE   Subjective:  Patient to have permcath placed today as well as first HD treatment. In good spirits. Interview conducted with use of online interpreter.    Objective:  Vital signs in last 24 hours:  Temp:  [97.9 F (36.6 C)-98.7 F (37.1 C)] 98.4 F (36.9 C) (07/12 1334) Pulse Rate:  [61-91] 81 (07/12 1515) Resp:  [8-20] 15 (07/12 1500) BP: (125-190)/(48-102) 133/48 (07/12 1515) SpO2:  [93 %-100 %] 96 % (07/12 1519)  Weight change:  Filed Weights   04/08/20 1525  Weight: 70.3 kg    Intake/Output: I/O last 3 completed shifts: In: 1979.4 [I.V.:1979.4] Out: -    Intake/Output this shift:  No intake/output data recorded.  Physical Exam: General: No acute distress  Head: Normocephalic, atraumatic. Moist oral mucosal membranes  Eyes: Anicteric  Neck: Supple, trachea midline  Lungs:  Clear to auscultation, normal effort  Heart: S1S2 no rubs  Abdomen:  Soft, nontender, bowel sounds present  Extremities: No peripheral edema.  Neurologic: Awake, alert, following commands  Skin: No lesions       Basic Metabolic Panel: Recent Labs  Lab 04/08/20 1535 04/09/20 0302 04/10/20 0736 04/10/20 1234  NA 134* 138 140  --   K 6.0* 5.0 4.3  --   CL 108 110 102  --   CO2 19* 18* 28  --   GLUCOSE 108* 108* 115*  --   BUN 85* 86* 75*  --   CREATININE 5.64* 5.36* 5.19*  --   CALCIUM 8.1* 8.3* 7.9*  --   PHOS  --   --   --  4.1    Liver Function Tests: No results for input(s): AST, ALT, ALKPHOS, BILITOT, PROT, ALBUMIN in the last 168 hours. No results for input(s): LIPASE, AMYLASE in the last 168 hours. No results for input(s): AMMONIA in the last 168 hours.  CBC: Recent Labs  Lab 04/08/20 1535 04/09/20 0302  WBC 7.4 7.2  HGB 9.0* 8.0*  HCT 27.8* 24.5*  MCV 88.0 87.5  PLT 190 230    Cardiac Enzymes: No results for input(s): CKTOTAL, CKMB, CKMBINDEX, TROPONINI in the last 168 hours.  BNP: Invalid input(s):  POCBNP  CBG: No results for input(s): GLUCAP in the last 168 hours.  Microbiology: Results for orders placed or performed during the hospital encounter of 04/08/20  SARS Coronavirus 2 by RT PCR (hospital order, performed in [facility] hospital lab) Nasopharyngeal Nasopharyngeal Swab     Status: None   Collection Time: 04/09/20  3:02 AM   Specimen: Nasopharyngeal Swab  Result Value Ref Range Status   SARS Coronavirus 2 NEGATIVE NEGATIVE Final    Comment: (NOTE) SARS-CoV-2 target nucleic acids are NOT DETECTED.  The SARS-CoV-2 RNA is generally detectable in upper and lower respiratory specimens during the acute phase of infection. The lowest concentration of SARS-CoV-2 viral copies this assay can detect is 250 copies / mL. A negative result does not preclude SARS-CoV-2 infection and should not be used as the sole basis for treatment or other patient management decisions.  A negative result may occur with improper specimen collection / handling, submission of specimen other than nasopharyngeal swab, presence of viral mutation(s) within the areas targeted by this assay, and inadequate number of viral copies (<250 copies / mL). A negative result must be combined with clinical observations, patient history, and epidemiological information.  Fact Sheet for Patients:   StrictlyIdeas.no  Fact Sheet for Healthcare Providers: BankingDealers.co.za  This test is not yet  approved or  cleared by the [place] and has been authorized for detection and/or diagnosis of SARS-CoV-2 by FDA under an Emergency Use Authorization (EUA).  This EUA will remain in effect (meaning this test can be used) for the duration of the COVID-19 declaration under Section 564(b)(1) of the Act, 21 U.S.C. section 360bbb-3(b)(1), unless the authorization is terminated or revoked sooner.  Performed at [facility], [address] [place], [place]  [address]     Coagulation Studies: No results for input(s): LABPROT, INR in the last 72 hours.  Urinalysis: Recent Labs    04/08/20 1535  COLORURINE COLORLESS*  LABSPEC 1.006  PHURINE 5.0  GLUCOSEU NEGATIVE  HGBUR NEGATIVE  BILIRUBINUR NEGATIVE  KETONESUR NEGATIVE  PROTEINUR 100*  NITRITE NEGATIVE  LEUKOCYTESUR NEGATIVE      Imaging: No results found.   Medications:   . sodium chloride    . sodium chloride     . Chlorhexidine Gluconate Cloth  6 each Topical Q0600  . fentaNYL      . fluticasone  2 spray Each Nare Daily  . heparin      . [START ON 04/11/2020] heparin injection (subcutaneous)  5,000 Units Subcutaneous Q8H  . hydrALAZINE  25 mg Oral Q8H  . metoprolol succinate  50 mg Oral Daily  . midazolam      . pantoprazole  40 mg Oral Daily   sodium chloride, sodium chloride, acetaminophen **OR** acetaminophen, alteplase, heparin, HYDROmorphone (DILAUDID) injection, lidocaine (PF), lidocaine-prilocaine, ondansetron **OR** ondansetron (ZOFRAN) IV, ondansetron (ZOFRAN) IV, pentafluoroprop-tetrafluoroeth  Assessment/ Plan:  79 y.o. female with a PMHx of chronic kidney disease stage V seen both by [facility] nephrology as well as [place] nephrology, anemia of chronic kidney disease, hyperlipidemia, GERD, gout, hypertension, who was admitted to [facility] on 04/08/2020 for evaluation of dizziness and headache.   1.  Chronic kidney disease stage V.  Patient has been followed both by [facility] nephrology as well as [place] nephrology recently.   Patient had PermCath placed today. She will have dialysis treatment thereafter. Outpatient hemodialysis center placement pending.  2. Hypertension. Blood pressure currently improved. Maintain the patient on hydralazine and metoprolol. Consider adding ARB once on dialysis.  3. Anemia of chronic kidney disease. Lab Results  Component Value Date   HGB 8.0 (L) 04/09/2020  Once she is on a regular dialysis schedule we will consider starting the patient on  Epogen.  4. Secondary hyperparathyroidism. Phosphorus 4.1 at last check. Continue to monitor bone mineral metabolism parameters.      LOS: 0 Ivey Nembhard 7/12/20213:34 PM

## 2020-04-10 NOTE — Op Note (Signed)
OPERATIVE NOTE    PRE-OPERATIVE DIAGNOSIS: 1. ESRD   POST-OPERATIVE DIAGNOSIS: same as above  PROCEDURE: 1. Ultrasound guidance for vascular access to the right internal jugular vein 2. Fluoroscopic guidance for placement of catheter 3. Placement of a 19 cm tip to cuff tunneled hemodialysis catheter via the right internal jugular vein  SURGEON: Leotis Pain, MD  ANESTHESIA:  Local with Moderate conscious sedation for approximately 15 minutes using 2 mg of Versed and 50 mcg of Fentanyl  ESTIMATED BLOOD LOSS: 5 cc  FLUORO TIME: less than one minute  CONTRAST: none  FINDING(S): 1.  Patent right internal jugular vein  SPECIMEN(S):  None  INDICATIONS:   Leah Davies is a 79 y.o.female who presents with renal failure.  The patient needs long term dialysis access for their ESRD, and a Permcath is necessary.  Risks and benefits are discussed and informed consent is obtained.    DESCRIPTION: After obtaining full informed written consent, the patient was brought back to the vascular suited. The patient's right neck and chest were sterilely prepped and draped in a sterile surgical field was created. Moderate conscious sedation was administered during a face to face encounter with the patient throughout the procedure with my supervision of the RN administering medicines and monitoring the patient's vital signs, pulse oximetry, telemetry and mental status throughout from the start of the procedure until the patient was taken to the recovery room.  The right internal jugular vein was visualized with ultrasound and found to be patent. It was then accessed under direct ultrasound guidance and a permanent image was recorded. A wire was placed. After skin nick and dilatation, the peel-away sheath was placed over the wire. I then turned my attention to an area under the clavicle. Approximately 1-2 fingerbreadths below the clavicle a small counterincision was created and tunneled from the subclavicular  incision to the access site. Using fluoroscopic guidance, a 19 centimeter tip to cuff tunneled hemodialysis catheter was selected, and tunneled from the subclavicular incision to the access site. It was then placed through the peel-away sheath and the peel-away sheath was removed. Using fluoroscopic guidance the catheter tips were parked in the right atrium. The appropriate distal connectors were placed. It withdrew blood well and flushed easily with heparinized saline and a concentrated heparin solution was then placed. It was secured to the chest wall with 2 Prolene sutures. The access incision was closed single 4-0 Monocryl. A 4-0 Monocryl pursestring suture was placed around the exit site. Sterile dressings were placed. The patient tolerated the procedure well and was taken to the recovery room in stable condition.  COMPLICATIONS: None  CONDITION: Stable  Leotis Pain, MD 04/10/2020 2:41 PM   This note was created with Dragon Medical transcription system. Any errors in dictation are purely unintentional.

## 2020-04-10 NOTE — H&P (Signed)
[facility] History & Physical Update  The patient was interviewed and re-examined.  The patient's previous History and Physical has been reviewed and is unchanged.  There is no change in the plan of care. We plan to proceed with the scheduled procedure.  Leotis Pain, MD  04/10/2020, 1:32 PM

## 2020-04-10 NOTE — Progress Notes (Signed)
Patient ID: Leah Davies, female   DOB: 1940-10-10, 79 y.o.   MRN: [id number] [facility] PROGRESS NOTE  ANTIONETTA ATOR JME:[id number] DOB: May 26, 1941 DOA: 04/08/2020 PCP: Freddy Finner, NP  HPI/Subjective: Interpreter used.  Patient complains of a little headache.  Patient complains of little postnasal drip.  Patient had a long discussion last night with nephrologist and they will start dialysis.  Vascular surgery was contacted for dialysis catheter.  Objective: Vitals:   04/10/20 0759 04/10/20 1004  BP: (!) 190/68 (!) 184/62  Pulse: 62 61  Resp: 20 16  Temp: 97.9 F (36.6 C) 98.5 F (36.9 C)  SpO2: 98% 96%    Intake/Output Summary (Last 24 hours) at 04/10/2020 1153 Last data filed at 04/10/2020 0533 Gross per 24 hour  Intake 1979.43 ml  Output --  Net 1979.43 ml   Filed Weights   04/08/20 1525  Weight: 70.3 kg    ROS: Review of Systems  HENT: Positive for congestion.   Respiratory: Negative for shortness of breath.   Cardiovascular: Negative for chest pain.  Gastrointestinal: Negative for abdominal pain.  Neurological: Positive for headaches.   Exam: Physical Exam HENT:     Head: Normocephalic.     Nose: No mucosal edema.     Mouth/Throat:     Pharynx: No oropharyngeal exudate.  Eyes:     General: Lids are normal.     Conjunctiva/sclera: Conjunctivae normal.     Pupils: Pupils are equal, round, and reactive to light.  Cardiovascular:     Rate and Rhythm: Normal rate and regular rhythm.     Heart sounds: Normal heart sounds, S1 normal and S2 normal.  Pulmonary:     Breath sounds: No decreased breath sounds, wheezing, rhonchi or rales.  Abdominal:     Palpations: Abdomen is soft.     Tenderness: There is no abdominal tenderness.  Musculoskeletal:     Right ankle: No swelling.     Left ankle: No swelling.  Skin:    General: Skin is warm.     Findings: No rash.  Neurological:     Mental Status: She is alert and oriented to person, place, and time.        Data Reviewed: Basic Metabolic Panel: Recent Labs  Lab 04/08/20 1535 04/09/20 0302 04/10/20 0736  NA 134* 138 140  K 6.0* 5.0 4.3  CL 108 110 102  CO2 19* 18* 28  GLUCOSE 108* 108* 115*  BUN 85* 86* 75*  CREATININE 5.64* 5.36* 5.19*  CALCIUM 8.1* 8.3* 7.9*   CBC: Recent Labs  Lab 04/08/20 1535 04/09/20 0302  WBC 7.4 7.2  HGB 9.0* 8.0*  HCT 27.8* 24.5*  MCV 88.0 87.5  PLT 190 230     Recent Results (from the past 240 hour(s))  SARS Coronavirus 2 by RT PCR (hospital order, performed in [facility] hospital lab) Nasopharyngeal Nasopharyngeal Swab     Status: None   Collection Time: 04/09/20  3:02 AM   Specimen: Nasopharyngeal Swab  Result Value Ref Range Status   SARS Coronavirus 2 NEGATIVE NEGATIVE Final    Comment: (NOTE) SARS-CoV-2 target nucleic acids are NOT DETECTED.  The SARS-CoV-2 RNA is generally detectable in upper and lower respiratory specimens during the acute phase of infection. The lowest concentration of SARS-CoV-2 viral copies this assay can detect is 250 copies / mL. A negative result does not preclude SARS-CoV-2 infection and should not be used as the sole basis for treatment or other patient management decisions.  A  negative result may occur with improper specimen collection / handling, submission of specimen other than nasopharyngeal swab, presence of viral mutation(s) within the areas targeted by this assay, and inadequate number of viral copies (<250 copies / mL). A negative result must be combined with clinical observations, patient history, and epidemiological information.  Fact Sheet for Patients:   StrictlyIdeas.no  Fact Sheet for Healthcare Providers: BankingDealers.co.za  This test is not yet approved or  cleared by the [place] FDA and has been authorized for detection and/or diagnosis of SARS-CoV-2 by FDA under an Emergency Use Authorization (EUA).  This EUA will  remain in effect (meaning this test can be used) for the duration of the COVID-19 declaration under Section 564(b)(1) of the Act, 21 U.S.C. section 360bbb-3(b)(1), unless the authorization is terminated or revoked sooner.  Performed at [facility], [address], [place], [place] [address]      Studies: No results found.  Scheduled Meds: . Chlorhexidine Gluconate Cloth  6 each Topical Q0600  . enoxaparin (LOVENOX) injection  30 mg Subcutaneous Q24H  . hydrALAZINE  25 mg Oral Q8H  . metoprolol succinate  50 mg Oral Daily  . pantoprazole  40 mg Oral Daily   Continuous Infusions:   Assessment/Plan:  1. End-stage renal disease requiring dialysis.  Dialysis catheter will be placed today and first dialysis hopefully this evening.  Patient will require 3 dialysis sessions in an outpatient dialysis slot prior to disposition.  Stop sodium bicarb drip.  Stop Lokelma since potassium is better today and patient will likely have dialysis. 2. Headache secondary to elevated blood pressure 3. Hypertensive urgency on presentation.  Restarted oral medications.  Blood pressure trended better yesterday but more elevated today.  Continue to monitor especially with starting dialysis. 4. Anemia of chronic disease continue to monitor.    Code Status:     Code Status Orders  (From admission, onward)         Start     Ordered   04/09/20 0136  Full code  Continuous        04/09/20 0139        Code Status History    Date Active Date Inactive Code Status Order ID Comments User Context   02/19/2020 2204 02/23/2020 2327 Full Code [id number]  Orene Desanctis, DO ED   10/07/2016 2205 10/09/2016 1722 Full Code [id number]  Bettey Costa, MD Inpatient   10/05/2015 1425 10/05/2015 1939 Full Code [id number]  Hessie Knows, MD Inpatient   Advance Care Planning Activity     Family Communication: Daughter at bedside Disposition Plan: Status is: Inpatient  Dispo: The patient is from: Home               Anticipated d/c is to: Home              Anticipated d/c date is: Earliest would be Wednesday afternoon after third dialysis session but will also need an outpatient dialysis slot.              Patient currently initiated on new dialysis.  Consultants:  Nephrology  Time spent: 27 minutes, via translator.   [place]  [facility] [employer]

## 2020-04-10 NOTE — Consult Note (Signed)
[facility] Vascular Consult Note  MRN : [id number]  Leah Davies is a 79 y.o. (1941/02/11) female who presents with chief complaint of  Chief Complaint  Patient presents with  . Dizziness  . Hypertension   History of Present Illness: Leah Davies is a 79 y.o. female with a history of chronic kidney disease not on dialysis and, hypertension, hyperlipidemia, gout, GERD who presents for evaluation of elevated blood pressure.  Patient reports that she was feeling unwell today with a sensation of "inflammation of the head" associated with lightheadedness.  When she feels like that is usually because her blood pressure was elevated.  She took her blood pressure which was in the 200s and that prompted the visit to the emergency room.  Patient was seen by nephrology yesterday who note known history of chronic kidney disease stage V seen both by [facility] nephrology as well as nephrology. Her creatinine was found to be 5.64 with an EGFR of 7.  Nephrology would like to start dialysis at this time however she does not have an adequate dialysis access.  Vascular surgery was consulted by Dr. Holley Raring for placement of a PermCath.  Current Facility-Administered Medications  Medication Dose Route Frequency Provider Last Rate Last Admin  . fentaNYL (SUBLIMAZE) 100 MCG/2ML injection           . midazolam (VERSED) 5 MG/5ML injection           . [MAR Hold] 0.9 %  sodium chloride infusion  100 mL Intravenous PRN Lateef, Munsoor, MD      . Doug Sou Hold] 0.9 %  sodium chloride infusion  100 mL Intravenous PRN Lateef, Munsoor, MD      . 0.9 %  sodium chloride infusion   Intravenous Continuous Tysen Roesler A, PA-C      . [MAR Hold] acetaminophen (TYLENOL) tablet 650 mg  650 mg Oral Q6H PRN Athena Masse, MD   650 mg at 04/09/20 2049   Or  . [MAR Hold] acetaminophen (TYLENOL) suppository 650 mg  650 mg Rectal Q6H PRN Athena Masse, MD      . Doug Sou Hold] alteplase (CATHFLO ACTIVASE)  injection 2 mg  2 mg Intracatheter Once PRN Holley Raring, Munsoor, MD      . Derrill Memo ON 04/11/2020] ceFAZolin (ANCEF) IVPB 1 g/50 mL premix  1 g Intravenous Once Randee Upchurch A, PA-C      . [MAR Hold] Chlorhexidine Gluconate Cloth 2 % PADS 6 each  6 each Topical Q0600 Holley Raring, Munsoor, MD   6 each at 04/10/20 1135  . diphenhydrAMINE (BENADRYL) injection 50 mg  50 mg Intravenous Once PRN Nawaal Alling A, PA-C      . famotidine (PEPCID) tablet 40 mg  40 mg Oral Once PRN Ceciley Buist, Janalyn Harder, PA-C      . [MAR Hold] fluticasone (FLONASE) 50 MCG/ACT nasal spray 2 spray  2 spray Each Nare Daily Wieting, Richard, MD      . Doug Sou Hold] heparin injection 1,000 Units  1,000 Units Dialysis PRN Anthonette Legato, MD      . Doug Sou Hold] heparin injection 5,000 Units  5,000 Units Subcutaneous Q8H Wieting, Richard, MD      . Doug Sou Hold] hydrALAZINE (APRESOLINE) tablet 25 mg  25 mg Oral Q8H Athena Masse, MD   25 mg at 04/10/20 0759  . [MAR Hold] HYDROmorphone (DILAUDID) injection 1 mg  1 mg Intravenous Once PRN Necie Wilcoxson, Janalyn Harder, PA-C      . [MAR Hold] lidocaine (  PF) (XYLOCAINE) 1 % injection 5 mL  5 mL Intradermal PRN Lateef, Munsoor, MD      . Doug Sou Hold] lidocaine-prilocaine (EMLA) cream 1 application  1 application Topical PRN Lateef, Munsoor, MD      . methylPREDNISolone sodium succinate (SOLU-MEDROL) 125 mg/2 mL injection 125 mg  125 mg Intravenous Once PRN Tracy Gerken, Janalyn Harder, PA-C      . [MAR Hold] metoprolol succinate (TOPROL-XL) 24 hr tablet 50 mg  50 mg Oral Daily Athena Masse, MD   50 mg at 04/09/20 2979  . midazolam (VERSED) 2 MG/ML syrup 8 mg  8 mg Oral Once PRN Asta Corbridge, Janalyn Harder, PA-C      . [MAR Hold] ondansetron (ZOFRAN) tablet 4 mg  4 mg Oral Q6H PRN Athena Masse, MD       Or  . Doug Sou Hold] ondansetron [facility]) injection 4 mg  4 mg Intravenous Q6H PRN Athena Masse, MD      . Doug Sou Hold] ondansetron [facility]) injection 4 mg  4 mg Intravenous Q6H PRN Sherrill Mckamie, Janalyn Harder, PA-C       . [MAR Hold] pantoprazole (PROTONIX) EC tablet 40 mg  40 mg Oral Daily Loletha Grayer, MD   40 mg at 04/10/20 0800  . [MAR Hold] pentafluoroprop-tetrafluoroeth (GEBAUERS) aerosol 1 application  1 application Topical PRN Anthonette Legato, MD       Past Medical History:  Diagnosis Date  . Chronic kidney disease    renal insufficiency  . Complication of anesthesia    Vital signs low during colonoscopy  . Elevated lipids   . GERD (gastroesophageal reflux disease)   . Gout   . Hypertension    Past Surgical History:  Procedure Laterality Date  . BREAST EXCISIONAL BIOPSY Left 2010   benign  . BREAST SURGERY     Lt breast biopsy  . KNEE ARTHROSCOPY Left 10/05/2015   Procedure: ARTHROSCOPY KNEE partial medial and lateral meniscectomy, lateral release for sublux patella;  Surgeon: Hessie Knows, MD;  Location: [facility] ORS;  Service: Orthopedics;  Laterality: Left;   Social History Social History   Tobacco Use  . Smoking status: Never Smoker  . Smokeless tobacco: Never Used  Substance Use Topics  . Alcohol use: Yes    Comment: rare  . Drug use: No   Family History Family History  Problem Relation Age of Onset  . Breast cancer Sister 23  Denies family history of peripheral artery disease, venous disease or renal disease.  Allergies  Allergen Reactions  . Amlodipine     Other reaction(s): Unknown  . Azithromycin     Other reaction(s): Unknown   REVIEW OF SYSTEMS (Negative unless checked)  Constitutional: [] Weight loss  [] Fever  [] Chills Cardiac: [] Chest pain   [] Chest pressure   [] Palpitations   [] Shortness of breath when laying flat   [] Shortness of breath at rest   [] Shortness of breath with exertion. Vascular:  [] Pain in legs with walking   [] Pain in legs at rest   [] Pain in legs when laying flat   [] Claudication   [] Pain in feet when walking  [] Pain in feet at rest  [] Pain in feet when laying flat   [] History of DVT   [] Phlebitis   [] Swelling in legs   [] Varicose veins    [] Non-healing ulcers Pulmonary:   [] Uses home oxygen   [] Productive cough   [] Hemoptysis   [] Wheeze  [] COPD   [] Asthma Neurologic:  [] Dizziness  [] Blackouts   [] Seizures   [] History of stroke   []   History of TIA  [] Aphasia   [] Temporary blindness   [] Dysphagia   [] Weakness or numbness in arms   [] Weakness or numbness in legs Musculoskeletal:  [x] Arthritis   [] Joint swelling   [] Joint pain   [] Low back pain Hematologic:  [] Easy bruising  [] Easy bleeding   [] Hypercoagulable state   [x] Anemic  [] Hepatitis Gastrointestinal:  [] Blood in stool   [] Vomiting blood  [] Gastroesophageal reflux/heartburn   [] Difficulty swallowing. Genitourinary:  [x] Chronic kidney disease   [] Difficult urination  [] Frequent urination  [] Burning with urination   [] Blood in urine Skin:  [] Rashes   [] Ulcers   [] Wounds Psychological:  [] History of anxiety   []  History of major depression.  Physical Examination  Vitals:   04/10/20 0011 04/10/20 0359 04/10/20 0759 04/10/20 1004  BP: (!) 155/65 (!) 161/60 (!) 190/68 (!) 184/62  Pulse: 67 64 62 61  Resp: 17 17 20 16   Temp: 98.2 F (36.8 C) 98 F (36.7 C) 97.9 F (36.6 C) 98.5 F (36.9 C)  TempSrc:   Oral Oral  SpO2: 98% 99% 98% 96%  Weight:       Body mass index is 31.31 kg/m. Gen:  WD/WN, NAD Head: [place]/AT, No temporalis wasting. Prominent temp pulse not noted. Ear/Nose/Throat: Hearing grossly intact, nares w/o erythema or drainage, oropharynx w/o Erythema/Exudate Eyes: Sclera non-icteric, conjunctiva clear Neck: Trachea midline.  No JVD.  Pulmonary:  Good air movement, respirations not labored, equal bilaterally.  Cardiac: RRR, normal S1, S2. Vascular:  Vessel Right Left  Radial Palpable Palpable  Ulnar Palpable Palpable  Brachial Palpable Palpable  Carotid Palpable, without bruit Palpable, without bruit  Aorta Not palpable N/A  Femoral Palpable Palpable  Popliteal Palpable Palpable  PT Palpable Palpable  DP Palpable Palpable   Gastrointestinal: soft,  non-tender/non-distended. No guarding/reflex.  Musculoskeletal: M/S 5/5 throughout.  Extremities without ischemic changes.  No deformity or atrophy. No edema. Neurologic: Sensation grossly intact in extremities.  Symmetrical.  Speech is fluent. Motor exam as listed above. Psychiatric: Judgment intact, Mood & affect appropriate for pt's clinical situation. Dermatologic: No rashes or ulcers noted.  No cellulitis or open wounds. Lymph : No Cervical, Axillary, or Inguinal lymphadenopathy.  CBC Lab Results  Component Value Date   WBC 7.2 04/09/2020   HGB 8.0 (L) 04/09/2020   HCT 24.5 (L) 04/09/2020   MCV 87.5 04/09/2020   PLT 230 04/09/2020   BMET    Component Value Date/Time   NA 140 04/10/2020 0736   K 4.3 04/10/2020 0736   CL 102 04/10/2020 0736   CO2 28 04/10/2020 0736   GLUCOSE 115 (H) 04/10/2020 0736   BUN 75 (H) 04/10/2020 0736   CREATININE 5.19 (H) 04/10/2020 0736   CALCIUM 7.9 (L) 04/10/2020 0736   GFRNONAA 7 (L) 04/10/2020 0736   GFRAA 9 (L) 04/10/2020 0736   Estimated Creatinine Clearance: 7.7 mL/min (A) (by C-G formula based on SCr of 5.19 mg/dL (H)).  COAG No results found for: INR, PROTIME  Radiology No results found.  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  End-stage renal disease: Patient with known history of chronic kidney disease which has progressed to end-stage renal.  Nephrology would like to start dialysis at this time.  The patient does not have an adequate dialysis access to allow for dialysis therefore with we will place a PermCath to allow her to  dialyze in the inpatient outpatient setting while awaiting a more permanent access.  Procedure, risks and benefits explained to the patient.  All questions answered.  Patient wishes to proceed.  2.  Anemia of chronic  disease: Asymptomatic at this time.  This is followed by the patient's primary care physician and nephrologist.  3.  Hypertensive urgency: Patient was originally seen in the emergency department for this. Still hypertensive hopefully will improve after dialysis is started.  Discussed with Dr. Mayme Genta, PA-C  04/10/2020 1:33 PM  This note was created with Dragon medical transcription system.  Any error is purely unintentional

## 2020-04-11 ENCOUNTER — Encounter: Payer: Self-pay | Admitting: Vascular Surgery

## 2020-04-11 ENCOUNTER — Inpatient Hospital Stay: Payer: Medicare HMO

## 2020-04-11 DIAGNOSIS — K5900 Constipation, unspecified: Secondary | ICD-10-CM

## 2020-04-11 LAB — HEPATITIS B SURFACE ANTIBODY, QUANTITATIVE
Hep B S AB Quant (Post): <3.1 m[IU]/mL — ABNORMAL LOW (ref >9.9)
Hep B S AB Quant (Post): <3.1 m[IU]/mL — ABNORMAL LOW (ref >9.9)

## 2020-04-11 LAB — PARATHYROID HORMONE, INTACT (NO CA): PTH: 336 pg/mL — ABNORMAL HIGH (ref 15–65)

## 2020-04-11 LAB — HEPATITIS B SURFACE ANTIGEN: Hepatitis B Surface Ag: NONREACTIVE

## 2020-04-11 MED ORDER — POLYETHYLENE GLYCOL 3350 17 G PO PACK
17.0000 g | PACK | Freq: Every day | ORAL | Status: DC
  Administered 2020-04-11 – 2020-04-13 (×3): 17 g via ORAL
  Filled 2020-04-11 (×3): qty 1

## 2020-04-11 MED ORDER — LOSARTAN POTASSIUM 50 MG PO TABS
50.0000 mg | ORAL_TABLET | Freq: Every day | ORAL | Status: DC
  Administered 2020-04-11 – 2020-04-13 (×3): 50 mg via ORAL
  Filled 2020-04-11 (×3): qty 1

## 2020-04-11 NOTE — Progress Notes (Signed)
HD Start

## 2020-04-11 NOTE — Progress Notes (Signed)
[facility]  ROUNDING NOTE   Subjective:  Patient due for second dialysis treatment today. Tolerated PermCath placement well yesterday.   Objective:  Vital signs in last 24 hours:  Temp:  [97.8 F (36.6 C)-98.9 F (37.2 C)] 97.9 F (36.6 C) (07/13 1010) Pulse Rate:  [64-91] 72 (07/13 1045) Resp:  [8-17] 12 (07/13 1045) BP: (125-182)/(43-102) 176/65 (07/13 1045) SpO2:  [93 %-100 %] 96 % (07/13 1010)  Weight change:  Filed Weights   04/08/20 1525  Weight: 70.3 kg    Intake/Output: I/O last 3 completed shifts: In: 866.3 [I.V.:866.3] Out: 0    Intake/Output this shift:  No intake/output data recorded.  Physical Exam: General: No acute distress  Head: Normocephalic, atraumatic. Moist oral mucosal membranes  Eyes: Anicteric  Neck: Supple, trachea midline  Lungs:  Clear to auscultation, normal effort  Heart: S1S2 no rubs  Abdomen:  Soft, nontender, bowel sounds present  Extremities: No peripheral edema.  Neurologic: Awake, alert, following commands  Skin: No lesions  Access: Right IJ PermCath placed 2/29/7989    Basic Metabolic Panel: Recent Labs  Lab 04/08/20 1535 04/09/20 0302 04/10/20 0736 04/10/20 1234  NA 134* 138 140  --   K 6.0* 5.0 4.3  --   CL 108 110 102  --   CO2 19* 18* 28  --   GLUCOSE 108* 108* 115*  --   BUN 85* 86* 75*  --   CREATININE 5.64* 5.36* 5.19*  --   CALCIUM 8.1* 8.3* 7.9*  --   PHOS  --   --   --  4.1    Liver Function Tests: No results for input(s): AST, ALT, ALKPHOS, BILITOT, PROT, ALBUMIN in the last 168 hours. No results for input(s): LIPASE, AMYLASE in the last 168 hours. No results for input(s): AMMONIA in the last 168 hours.  CBC: Recent Labs  Lab 04/08/20 1535 04/09/20 0302  WBC 7.4 7.2  HGB 9.0* 8.0*  HCT 27.8* 24.5*  MCV 88.0 87.5  PLT 190 230    Cardiac Enzymes: No results for input(s): CKTOTAL, CKMB, CKMBINDEX, TROPONINI in the last 168 hours.  BNP: Invalid input(s): POCBNP  CBG: No  results for input(s): GLUCAP in the last 168 hours.  Microbiology: Results for orders placed or performed during the hospital encounter of 04/08/20  SARS Coronavirus 2 by RT PCR (hospital order, performed in [facility] hospital lab) Nasopharyngeal Nasopharyngeal Swab     Status: None   Collection Time: 04/09/20  3:02 AM   Specimen: Nasopharyngeal Swab  Result Value Ref Range Status   SARS Coronavirus 2 NEGATIVE NEGATIVE Final    Comment: (NOTE) SARS-CoV-2 target nucleic acids are NOT DETECTED.  The SARS-CoV-2 RNA is generally detectable in upper and lower respiratory specimens during the acute phase of infection. The lowest concentration of SARS-CoV-2 viral copies this assay can detect is 250 copies / mL. A negative result does not preclude SARS-CoV-2 infection and should not be used as the sole basis for treatment or other patient management decisions.  A negative result may occur with improper specimen collection / handling, submission of specimen other than nasopharyngeal swab, presence of viral mutation(s) within the areas targeted by this assay, and inadequate number of viral copies (<250 copies / mL). A negative result must be combined with clinical observations, patient history, and epidemiological information.  Fact Sheet for Patients:   StrictlyIdeas.no  Fact Sheet for Healthcare Providers: BankingDealers.co.za  This test is not yet approved or  cleared by the [place]  States FDA and has been authorized for detection and/or diagnosis of SARS-CoV-2 by FDA under an Emergency Use Authorization (EUA).  This EUA will remain in effect (meaning this test can be used) for the duration of the COVID-19 declaration under Section 564(b)(1) of the Act, 21 U.S.C. section 360bbb-3(b)(1), unless the authorization is terminated or revoked sooner.  Performed at [facility], [address] [place], [place] [address]      Coagulation Studies: No results for input(s): LABPROT, INR in the last 72 hours.  Urinalysis: Recent Labs    04/08/20 [place] 1.006  PHURINE 5.0  GLUCOSEU NEGATIVE  HGBUR NEGATIVE  BILIRUBINUR NEGATIVE  KETONESUR NEGATIVE  PROTEINUR 100*  NITRITE NEGATIVE  LEUKOCYTESUR NEGATIVE      Imaging: PERIPHERAL VASCULAR CATHETERIZATION  Result Date: 04/10/2020 See op note    Medications:    sodium chloride     sodium chloride      Chlorhexidine Gluconate Cloth  6 each Topical Q0600   fluticasone  2 spray Each Nare Daily   heparin injection (subcutaneous)  5,000 Units Subcutaneous Q8H   hydrALAZINE  25 mg Oral Q8H   metoprolol succinate  50 mg Oral Daily   pantoprazole  40 mg Oral Daily   sodium chloride, sodium chloride, acetaminophen **OR** acetaminophen, alteplase, heparin, HYDROmorphone (DILAUDID) injection, lidocaine (PF), lidocaine-prilocaine, ondansetron **OR** ondansetron (ZOFRAN) IV, ondansetron (ZOFRAN) IV, pentafluoroprop-tetrafluoroeth  Assessment/ Plan:  79 y.o. female with a PMHx of chronic kidney disease stage V seen both by [facility] nephrology as well as [place] nephrology, anemia of chronic kidney disease, hyperlipidemia, GERD, gout, hypertension, who was admitted to [facility] on 04/08/2020 for evaluation of dizziness and headache.   1.  Chronic kidney disease stage V.  Patient has been followed both by [facility] nephrology as well as [place] nephrology recently.   -PermCath placed yesterday.  Tolerated first dialysis treatment well yesterday.  She will have second dialysis treatment performed today.  Outpatient hemodialysis center placement pending at [place]. [place]. under [facility] nephrology.  2. Hypertension.  Currently maintained on hydralazine and metoprolol.  Add losartan 50 mg daily.  3. Anemia of chronic kidney disease. Lab Results  Component Value Date   HGB 8.0 (L) 04/09/2020  Recommend initiation of Epogen once her normal  dialysis days are known.  4. Secondary hyperparathyroidism.  Continue to periodically monitor bone mineral metabolism parameters.      LOS: 1 Laquon Emel 7/13/202110:59 AM

## 2020-04-11 NOTE — Progress Notes (Signed)
Down to dialysis at this time via  bed

## 2020-04-11 NOTE — Progress Notes (Signed)
Patient ID: Leah Davies, female   DOB: 1940-12-10, 79 y.o.   MRN: [id number] [facility] PROGRESS NOTE  DARCIA LAMPI NIO:[id number] DOB: 12/14/1940 DOA: 04/08/2020 PCP: Freddy Finner, NP  HPI/Subjective: Patient admitted with hyperkalemia headache and accelerated hypertension.  Patient feeling better now.  Blood pressure medications will be given after dialysis today.  Patient sometimes feels some tingling on the top of her head which lasts seconds at a time.  Occasionally has headache.  Having some constipation.  Objective: Vitals:   04/11/20 1230 04/11/20 1240  BP: 119/86 (!) 184/84  Pulse:  67  Resp: 14 14  Temp:  98.1 F (36.7 C)  SpO2:  99%    Intake/Output Summary (Last 24 hours) at 04/11/2020 1413 Last data filed at 04/11/2020 1240 Gross per 24 hour  Intake --  Output 0 ml  Net 0 ml   Filed Weights   04/08/20 1525  Weight: 70.3 kg    ROS: Review of Systems  Respiratory: Negative for shortness of breath.   Cardiovascular: Negative for chest pain.  Gastrointestinal: Positive for constipation. Negative for abdominal pain, nausea and vomiting.  Neurological: Positive for headaches.   Exam: Physical Exam HENT:     Head: Normocephalic.  Eyes:     General: Lids are normal.     Conjunctiva/sclera: Conjunctivae normal.  Cardiovascular:     Rate and Rhythm: Normal rate and regular rhythm.     Heart sounds: Normal heart sounds, S1 normal and S2 normal.  Pulmonary:     Breath sounds: No decreased breath sounds, wheezing, rhonchi or rales.  Abdominal:     Palpations: Abdomen is soft.     Tenderness: There is no abdominal tenderness.  Musculoskeletal:     Right ankle: No swelling.     Left ankle: No swelling.  Skin:    General: Skin is warm.     Findings: No rash.  Neurological:     Mental Status: She is alert and oriented to person, place, and time.       Data Reviewed: Basic Metabolic Panel: Recent Labs  Lab 04/08/20 1535 04/09/20 0302  04/10/20 0736 04/10/20 1234  NA 134* 138 140  --   K 6.0* 5.0 4.3  --   CL 108 110 102  --   CO2 19* 18* 28  --   GLUCOSE 108* 108* 115*  --   BUN 85* 86* 75*  --   CREATININE 5.64* 5.36* 5.19*  --   CALCIUM 8.1* 8.3* 7.9*  --   PHOS  --   --   --  4.1   CBC: Recent Labs  Lab 04/08/20 1535 04/09/20 0302  WBC 7.4 7.2  HGB 9.0* 8.0*  HCT 27.8* 24.5*  MCV 88.0 87.5  PLT 190 230     Recent Results (from the past 240 hour(s))  SARS Coronavirus 2 by RT PCR (hospital order, performed in [facility] hospital lab) Nasopharyngeal Nasopharyngeal Swab     Status: None   Collection Time: 04/09/20  3:02 AM   Specimen: Nasopharyngeal Swab  Result Value Ref Range Status   SARS Coronavirus 2 NEGATIVE NEGATIVE Final    Comment: (NOTE) SARS-CoV-2 target nucleic acids are NOT DETECTED.  The SARS-CoV-2 RNA is generally detectable in upper and lower respiratory specimens during the acute phase of infection. The lowest concentration of SARS-CoV-2 viral copies this assay can detect is 250 copies / mL. A negative result does not preclude SARS-CoV-2 infection and should not be used as the sole  basis for treatment or other patient management decisions.  A negative result may occur with improper specimen collection / handling, submission of specimen other than nasopharyngeal swab, presence of viral mutation(s) within the areas targeted by this assay, and inadequate number of viral copies (<250 copies / mL). A negative result must be combined with clinical observations, patient history, and epidemiological information.  Fact Sheet for Patients:   StrictlyIdeas.no  Fact Sheet for Healthcare Providers: BankingDealers.co.za  This test is not yet approved or  cleared by the [place] FDA and has been authorized for detection and/or diagnosis of SARS-CoV-2 by FDA under an Emergency Use Authorization (EUA).  This EUA will remain in effect  (meaning this test can be used) for the duration of the COVID-19 declaration under Section 564(b)(1) of the Act, 21 U.S.C. section 360bbb-3(b)(1), unless the authorization is terminated or revoked sooner.  Performed at [facility], [address] [place], [place] [address]      Studies: PERIPHERAL VASCULAR CATHETERIZATION  Result Date: 04/10/2020 See op note   Scheduled Meds: . Chlorhexidine Gluconate Cloth  6 each Topical Q0600  . fluticasone  2 spray Each Nare Daily  . heparin injection (subcutaneous)  5,000 Units Subcutaneous Q8H  . hydrALAZINE  25 mg Oral Q8H  . losartan  50 mg Oral Daily  . metoprolol succinate  50 mg Oral Daily  . pantoprazole  40 mg Oral Daily  . polyethylene glycol  17 g Oral Daily   Continuous Infusions: . sodium chloride    . sodium chloride      Assessment/Plan:  1. End-stage renal disease requiring dialysis.  Patient had dialysis catheter placed yesterday.  Seen after second dialysis session here in the hospital today.  Patient will need 1 more dialysis session here in the hospital and an outpatient dialysis slot prior to disposition. 2. Headache likely secondary to hypertensive urgency on presentation.  Patient also feels like he has some tingling on the top of her head that lasts seconds at a time. 3. Hypertensive urgency on presentation.  Patient on oral blood pressure medications.  Blood pressure little high with dialysis today.  Blood pressure medications will be given now. 4. Constipation start MiraLAX 5. Anemia of chronic disease.    Code Status:     Code Status Orders  (From admission, onward)         Start     Ordered   04/09/20 0136  Full code  Continuous        04/09/20 0139        Code Status History    Date Active Date Inactive Code Status Order ID Comments User Context   02/19/2020 2204 02/23/2020 2327 Full Code [id number]  Orene Desanctis, DO ED   10/07/2016 2205 10/09/2016 1722 Full Code [id number]  Bettey Costa, MD  Inpatient   10/05/2015 1425 10/05/2015 1939 Full Code [id number]  Hessie Knows, MD Inpatient   Advance Care Planning Activity     Family Communication: Granddaughter at the bedside Disposition Plan: Status is: Inpatient  Dispo: The patient is from: Home              Anticipated d/c is to: Home              Anticipated d/c date is: Earliest potential will be Wednesday afternoon but must have an outpatient dialysis slot in order to make a disposition              Patient currently being treated for new  dialysis start here in the hospital and will receive third dialysis session tomorrow.  Consultants:  Nephrology  Vascular surgery  Time spent: 28 minutes, via translator.   [place]  [facility] [employer]

## 2020-04-11 NOTE — Progress Notes (Signed)
Aware of this patient, starting hemodialysis outpatient placement. Please contact me with any dialysis placement concerns.  Elvera Bicker Dialysis Coordinator [phone]

## 2020-04-12 DIAGNOSIS — N185 Chronic kidney disease, stage 5: Secondary | ICD-10-CM

## 2020-04-12 DIAGNOSIS — I10 Essential (primary) hypertension: Secondary | ICD-10-CM

## 2020-04-12 DIAGNOSIS — D638 Anemia in other chronic diseases classified elsewhere: Secondary | ICD-10-CM

## 2020-04-12 LAB — BASIC METABOLIC PANEL
Anion gap: 12 (ref 5–15)
BUN: 34 mg/dL — ABNORMAL HIGH (ref 8–23)
CO2: 26 mmol/L (ref 22–32)
Calcium: 8.3 mg/dL — ABNORMAL LOW (ref 8.9–10.3)
Chloride: 102 mmol/L (ref 98–111)
Creatinine, Ser: 3.91 mg/dL — ABNORMAL HIGH (ref 0.44–1.00)
GFR calc Af Amer: 12 mL/min — ABNORMAL LOW (ref >60)
GFR calc non Af Amer: 10 mL/min — ABNORMAL LOW (ref >60)
Glucose, Bld: 94 mg/dL (ref 70–99)
Potassium: 4.5 mmol/L (ref 3.5–5.1)
Sodium: 140 mmol/L (ref 135–145)

## 2020-04-12 LAB — CBC
HCT: 28.6 % — ABNORMAL LOW (ref 36.0–46.0)
Hemoglobin: 9.6 g/dL — ABNORMAL LOW (ref 12.0–15.0)
MCH: 28.9 pg (ref 26.0–34.0)
MCHC: 33.6 g/dL (ref 30.0–36.0)
MCV: 86.1 fL (ref 80.0–100.0)
Platelets: 167 10*3/uL (ref 150–400)
RBC: 3.32 MIL/uL — ABNORMAL LOW (ref 3.87–5.11)
RDW: 13.2 % (ref 11.5–15.5)
WBC: 6.6 10*3/uL (ref 4.0–10.5)
nRBC: 0 % (ref 0.0–0.2)

## 2020-04-12 NOTE — Progress Notes (Signed)
[facility]  ROUNDING NOTE   Subjective:  Patient underwent third dialysis treatment today. Tolerated well. Overall improving.  Objective:  Vital signs in last 24 hours:  Temp:  [97.9 F (36.6 C)-98.9 F (37.2 C)] 98.4 F (36.9 C) (07/14 1250) Pulse Rate:  [64-73] 69 (07/14 1250) Resp:  [12-20] 15 (07/14 1250) BP: (91-180)/(49-87) 113/54 (07/14 1250) SpO2:  [94 %-97 %] 97 % (07/14 0905)  Weight change:  Filed Weights   04/08/20 1525  Weight: 70.3 kg    Intake/Output: No intake/output data recorded.   Intake/Output this shift:  Total I/O In: -  Out: 890 [Other:890]  Physical Exam: General: No acute distress  Head: Normocephalic, atraumatic. Moist oral mucosal membranes  Eyes: Anicteric  Neck: Supple, trachea midline  Lungs:  Clear to auscultation, normal effort  Heart: S1S2 no rubs  Abdomen:  Soft, nontender, bowel sounds present  Extremities: No peripheral edema.  Neurologic: Awake, alert, following commands  Skin: No lesions  Access: Right IJ PermCath placed 3/79/0240    Basic Metabolic Panel: Recent Labs  Lab 04/08/20 1535 04/08/20 1535 04/09/20 0302 04/10/20 0736 04/10/20 1234 04/12/20 0755  NA 134*  --  138 140  --  140  K 6.0*  --  5.0 4.3  --  4.5  CL 108  --  110 102  --  102  CO2 19*  --  18* 28  --  26  GLUCOSE 108*  --  108* 115*  --  94  BUN 85*  --  86* 75*  --  34*  CREATININE 5.64*  --  5.36* 5.19*  --  3.91*  CALCIUM 8.1*   < > 8.3* 7.9*  --  8.3*  PHOS  --   --   --   --  4.1  --    < > = values in this interval not displayed.    Liver Function Tests: No results for input(s): AST, ALT, ALKPHOS, BILITOT, PROT, ALBUMIN in the last 168 hours. No results for input(s): LIPASE, AMYLASE in the last 168 hours. No results for input(s): AMMONIA in the last 168 hours.  CBC: Recent Labs  Lab 04/08/20 1535 04/09/20 0302 04/12/20 0755  WBC 7.4 7.2 6.6  HGB 9.0* 8.0* 9.6*  HCT 27.8* 24.5* 28.6*  MCV 88.0 87.5 86.1  PLT  190 230 167    Cardiac Enzymes: No results for input(s): CKTOTAL, CKMB, CKMBINDEX, TROPONINI in the last 168 hours.  BNP: Invalid input(s): POCBNP  CBG: No results for input(s): GLUCAP in the last 168 hours.  Microbiology: Results for orders placed or performed during the hospital encounter of 04/08/20  SARS Coronavirus 2 by RT PCR (hospital order, performed in [facility] hospital lab) Nasopharyngeal Nasopharyngeal Swab     Status: None   Collection Time: 04/09/20  3:02 AM   Specimen: Nasopharyngeal Swab  Result Value Ref Range Status   SARS Coronavirus 2 NEGATIVE NEGATIVE Final    Comment: (NOTE) SARS-CoV-2 target nucleic acids are NOT DETECTED.  The SARS-CoV-2 RNA is generally detectable in upper and lower respiratory specimens during the acute phase of infection. The lowest concentration of SARS-CoV-2 viral copies this assay can detect is 250 copies / mL. A negative result does not preclude SARS-CoV-2 infection and should not be used as the sole basis for treatment or other patient management decisions.  A negative result may occur with improper specimen collection / handling, submission of specimen other than nasopharyngeal swab, presence of viral mutation(s) within the areas targeted  by this assay, and inadequate number of viral copies (<250 copies / mL). A negative result must be combined with clinical observations, patient history, and epidemiological information.  Fact Sheet for Patients:   StrictlyIdeas.no  Fact Sheet for Healthcare Providers: BankingDealers.co.za  This test is not yet approved or  cleared by the [place] FDA and has been authorized for detection and/or diagnosis of SARS-CoV-2 by FDA under an Emergency Use Authorization (EUA).  This EUA will remain in effect (meaning this test can be used) for the duration of the COVID-19 declaration under Section 564(b)(1) of the Act, 21 U.S.C. section  360bbb-3(b)(1), unless the authorization is terminated or revoked sooner.  Performed at [facility], [address] [place], [place] [address]     Coagulation Studies: No results for input(s): LABPROT, INR in the last 72 hours.  Urinalysis: No results for input(s): COLORURINE, LABSPEC, PHURINE, GLUCOSEU, HGBUR, BILIRUBINUR, KETONESUR, PROTEINUR, UROBILINOGEN, NITRITE, LEUKOCYTESUR in the last 72 hours.  Invalid input(s): APPERANCEUR    Imaging: DG Chest 2 View  Result Date: 04/11/2020 CLINICAL DATA:  End-stage renal disease.  New dialysis starch. EXAM: CHEST - 2 VIEW COMPARISON:  One-view chest x-ray 10/07/2016 FINDINGS: Heart is enlarged, exaggerated by low lung volumes. Atherosclerotic changes are noted at the aortic arch. No edema or effusion is present. No focal airspace disease is evident. Right IJ dialysis catheter terminates at the right atrium. No pneumothorax is present. Soft tissues are otherwise unremarkable. IMPRESSION: 1. Cardiomegaly without failure. 2. Right IJ dialysis catheter terminates at the right atrium. Electronically Signed   By: San Morelle M.D.   On: 04/11/2020 16:39   PERIPHERAL VASCULAR CATHETERIZATION  Result Date: 04/10/2020 See op note    Medications:   . sodium chloride    . sodium chloride     . Chlorhexidine Gluconate Cloth  6 each Topical Q0600  . fluticasone  2 spray Each Nare Daily  . heparin injection (subcutaneous)  5,000 Units Subcutaneous Q8H  . hydrALAZINE  25 mg Oral Q8H  . losartan  50 mg Oral Daily  . metoprolol succinate  50 mg Oral Daily  . pantoprazole  40 mg Oral Daily  . polyethylene glycol  17 g Oral Daily   sodium chloride, sodium chloride, acetaminophen **OR** acetaminophen, alteplase, heparin, HYDROmorphone (DILAUDID) injection, lidocaine (PF), lidocaine-prilocaine, ondansetron **OR** ondansetron (ZOFRAN) IV, ondansetron (ZOFRAN) IV, pentafluoroprop-tetrafluoroeth  Assessment/ Plan:  79 y.o. female  with a PMHx of chronic kidney disease stage V seen both by [facility] nephrology as well as [facility], anemia of chronic kidney disease, hyperlipidemia, GERD, gout, hypertension, who was admitted to [facility] on 04/08/2020 for evaluation of dizziness and headache.   1.  Chronic kidney disease stage V.  Patient has been followed both by [facility] nephrology as well as [place] [facility] nephrology recently.   -Patient underwent third dialysis treatment today.  Tolerated well.  Outpatient hemodialysis center placement pending.  2. Hypertension.  Blood pressure much improved.  Continue hydralazine, metoprolol, and losartan.  3. Anemia of chronic kidney disease. Lab Results  Component Value Date   HGB 9.6 (L) 04/12/2020  Consider starting Epogen once transition to outpatient care.  4. Secondary hyperparathyroidism.  Phosphorus 4.1 and at target..      LOS: 2 Leah Davies 7/14/20212:21 PM

## 2020-04-12 NOTE — Progress Notes (Signed)
PT Cancellation Note  Patient Details Name: EMARI HREHA MRN: [id number] DOB: 03/30/1941   Cancelled Treatment:    Reason Eval/Treat Not Completed: Other (comment). Consult received. Pt currently at HD, unavailable for therapy at this time. Will re-attempt tomorrow.   Chaundra Abreu 04/12/2020, 12:47 PM  Greggory Stallion, PT, DPT [phone]

## 2020-04-12 NOTE — Progress Notes (Signed)
PROGRESS NOTE    Leah Davies  DVV:[id number] DOB: 02-Feb-1941 DOA: 04/08/2020 PCP: Freddy Finner, NP  Assessment & Plan:   Principal Problem:   Acute renal failure superimposed on stage 5 chronic kidney disease, not on chronic dialysis [facility]) Active Problems:   Hyperkalemia   Anemia of chronic disease   Metabolic acidosis   Hypertensive urgency   AKI (acute kidney injury) ([place])   ESRD (end stage renal disease) ([place])   Nonintractable headache   Constipation  ESRD: on HD. Permcath placed on 04/10/20. Nephro following and recs apprec  HTN urgency: resolved but still w/ HTN. 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 No need for a transfusion at this time. Will continue to monitor   GERD: continue on PPI    DVT prophylaxis: heparin  Code Status: full  Family Communication: discussed pt's care w/ Erlene Quan (pt's grandson) via phone and answered his questions Disposition Plan: depends on PT recs   Consultants:   nephro  Vascular surg    Procedures: permcath placement     Antimicrobials:    Subjective: Pt c/o malaise   Objective: Vitals:   04/11/20 1926 04/11/20 2359 04/12/20 0551 04/12/20 0551  BP: (!) 118/53 (!) 175/51 (!) 180/55 (!) 180/55  Pulse: 66 66  64  Resp: 16 16  16   Temp: 98.9 F (37.2 C) 97.9 F (36.6 C)  98.8 F (37.1 C)  TempSrc: Oral Oral  Oral  SpO2: 96% 95%  96%  Weight:        Intake/Output Summary (Last 24 hours) at 04/12/2020 0751 Last data filed at 04/11/2020 1240 Gross per 24 hour  Intake --  Output 0 ml  Net 0 ml   Filed Weights   04/08/20 1525  Weight: 70.3 kg    Examination:  General exam: Appears calm and comfortable  Respiratory system: diminished breath sounds b/l. No wheezes Cardiovascular system: S1 & S2 +. No  rubs, gallops or clicks.  Gastrointestinal system: Abdomen is nondistended, soft and nontender. Normal bowel sounds heard. Central nervous  system: Alert and oriented. Moves all 4 extremities  Psychiatry: Judgement and insight appear normal. Flat mood and affect    Data Reviewed: I have personally reviewed following labs and imaging studies  CBC: Recent Labs  Lab 04/08/20 1535 04/09/20 0302  WBC 7.4 7.2  HGB 9.0* 8.0*  HCT 27.8* 24.5*  MCV 88.0 87.5  PLT 190 626   Basic Metabolic Panel: Recent Labs  Lab 04/08/20 1535 04/09/20 0302 04/10/20 0736 04/10/20 1234  NA 134* 138 140  --   K 6.0* 5.0 4.3  --   CL 108 110 102  --   CO2 19* 18* 28  --   GLUCOSE 108* 108* 115*  --   BUN 85* 86* 75*  --   CREATININE 5.64* 5.36* 5.19*  --   CALCIUM 8.1* 8.3* 7.9*  --   PHOS  --   --   --  4.1   GFR: Estimated Creatinine Clearance: 7.7 mL/min (A) (by C-G formula based on SCr of 5.19 mg/dL (H)). Liver Function Tests: No results for input(s): AST, ALT, ALKPHOS, BILITOT, PROT, ALBUMIN in the last 168 hours. No results for input(s): LIPASE, AMYLASE in the last 168 hours. No results for input(s): AMMONIA in the last 168 hours. Coagulation Profile: No results for input(s): INR, PROTIME in the last 168 hours. Cardiac Enzymes: No results for input(s): CKTOTAL, CKMB, CKMBINDEX, TROPONINI in the last 168 hours.  BNP (last 3 results) No results for input(s): PROBNP in the last 8760 hours. HbA1C: No results for input(s): HGBA1C in the last 72 hours. CBG: No results for input(s): GLUCAP in the last 168 hours. Lipid Profile: No results for input(s): CHOL, HDL, LDLCALC, TRIG, CHOLHDL, LDLDIRECT in the last 72 hours. Thyroid Function Tests: No results for input(s): TSH, T4TOTAL, FREET4, T3FREE, THYROIDAB in the last 72 hours. Anemia Panel: No results for input(s): VITAMINB12, FOLATE, FERRITIN, TIBC, IRON, RETICCTPCT in the last 72 hours. Sepsis Labs: No results for input(s): PROCALCITON, LATICACIDVEN in the last 168 hours.  Recent Results (from the past 240 hour(s))  SARS Coronavirus 2 by RT PCR (hospital order, performed in  [facility] hospital lab) Nasopharyngeal Nasopharyngeal Swab     Status: None   Collection Time: 04/09/20  3:02 AM   Specimen: Nasopharyngeal Swab  Result Value Ref Range Status   SARS Coronavirus 2 NEGATIVE NEGATIVE Final    Comment: (NOTE) SARS-CoV-2 target nucleic acids are NOT DETECTED.  The SARS-CoV-2 RNA is generally detectable in upper and lower respiratory specimens during the acute phase of infection. The lowest concentration of SARS-CoV-2 viral copies this assay can detect is 250 copies / mL. A negative result does not preclude SARS-CoV-2 infection and should not be used as the sole basis for treatment or other patient management decisions.  A negative result may occur with improper specimen collection / handling, submission of specimen other than nasopharyngeal swab, presence of viral mutation(s) within the areas targeted by this assay, and inadequate number of viral copies (<250 copies / mL). A negative result must be combined with clinical observations, patient history, and epidemiological information.  Fact Sheet for Patients:   StrictlyIdeas.no  Fact Sheet for Healthcare Providers: BankingDealers.co.za  This test is not yet approved or  cleared by the [place] FDA and has been authorized for detection and/or diagnosis of SARS-CoV-2 by FDA under an Emergency Use Authorization (EUA).  This EUA will remain in effect (meaning this test can be used) for the duration of the COVID-19 declaration under Section 564(b)(1) of the Act, 21 U.S.C. section 360bbb-3(b)(1), unless the authorization is terminated or revoked sooner.  Performed at [facility], [address], [place], [place] [address]          Radiology Studies: DG Chest 2 View  Result Date: 04/11/2020 CLINICAL DATA:  End-stage renal disease.  New dialysis starch. EXAM: CHEST - 2 VIEW COMPARISON:  One-view chest x-ray 10/07/2016 FINDINGS: Heart is  enlarged, exaggerated by low lung volumes. Atherosclerotic changes are noted at the aortic arch. No edema or effusion is present. No focal airspace disease is evident. Right IJ dialysis catheter terminates at the right atrium. No pneumothorax is present. Soft tissues are otherwise unremarkable. IMPRESSION: 1. Cardiomegaly without failure. 2. Right IJ dialysis catheter terminates at the right atrium. Electronically Signed   By: San Morelle M.D.   On: 04/11/2020 16:39   PERIPHERAL VASCULAR CATHETERIZATION  Result Date: 04/10/2020 See op note       Scheduled Meds: . Chlorhexidine Gluconate Cloth  6 each Topical Q0600  . fluticasone  2 spray Each Nare Daily  . heparin injection (subcutaneous)  5,000 Units Subcutaneous Q8H  . hydrALAZINE  25 mg Oral Q8H  . losartan  50 mg Oral Daily  . metoprolol succinate  50 mg Oral Daily  . pantoprazole  40 mg Oral Daily  . polyethylene glycol  17 g Oral Daily   Continuous Infusions: . sodium chloride    .  sodium chloride       LOS: 2 days    Time spent: 34 mins   Wyvonnia Dusky, MD [facility] Pager [phone]  If 7PM-7AM, please contact night-coverage [email or web address] 04/12/2020, 7:51 AM

## 2020-04-13 LAB — CBC
HCT: 27.4 % — ABNORMAL LOW (ref 36.0–46.0)
Hemoglobin: 9 g/dL — ABNORMAL LOW (ref 12.0–15.0)
MCH: 28.6 pg (ref 26.0–34.0)
MCHC: 32.8 g/dL (ref 30.0–36.0)
MCV: 87 fL (ref 80.0–100.0)
Platelets: 138 10*3/uL — ABNORMAL LOW (ref 150–400)
RBC: 3.15 MIL/uL — ABNORMAL LOW (ref 3.87–5.11)
RDW: 13 % (ref 11.5–15.5)
WBC: 6.7 10*3/uL (ref 4.0–10.5)
nRBC: 0 % (ref 0.0–0.2)

## 2020-04-13 LAB — BASIC METABOLIC PANEL
Anion gap: 9 (ref 5–15)
BUN: 23 mg/dL (ref 8–23)
CO2: 33 mmol/L — ABNORMAL HIGH (ref 22–32)
Calcium: 8 mg/dL — ABNORMAL LOW (ref 8.9–10.3)
Chloride: 95 mmol/L — ABNORMAL LOW (ref 98–111)
Creatinine, Ser: 3.09 mg/dL — ABNORMAL HIGH (ref 0.44–1.00)
GFR calc Af Amer: 16 mL/min — ABNORMAL LOW (ref >60)
GFR calc non Af Amer: 14 mL/min — ABNORMAL LOW (ref >60)
Glucose, Bld: 102 mg/dL — ABNORMAL HIGH (ref 70–99)
Potassium: 4.1 mmol/L (ref 3.5–5.1)
Sodium: 137 mmol/L (ref 135–145)

## 2020-04-13 MED ORDER — LOSARTAN POTASSIUM 50 MG PO TABS: 50.0000 mg | ORAL_TABLET | Freq: Every day | ORAL | 0 refills | Status: DC

## 2020-04-13 MED ORDER — HYDRALAZINE HCL 25 MG PO TABS: 25.0000 mg | ORAL_TABLET | Freq: Three times a day (TID) | ORAL | 0 refills | Status: DC

## 2020-04-13 NOTE — Discharge Summary (Signed)
Physician Discharge Summary  [name] BSW:[id number] DOB: 02/26/1941 DOA: 04/08/2020  PCP: Freddy Finner, NP  Admit date: 04/08/2020 Discharge date: 04/13/2020  Admitted From: home  Disposition:  home  Recommendations for Outpatient Follow-up:  1. Follow up with PCP in 1-2 weeks 2. F/u nephro in 1 week   Home Health: no Equipment/Devices:  Discharge Condition: stable CODE STATUS: full  Diet recommendation: Heart Healthy  Brief/Interim Summary: HPI was taken from Dr. Damita Dunnings: Leah Davies is a 79 y.o. 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  She checked her blood pressure with systolic over 466 and presented to the emergency room.  She denied chest pains or shortness of breath.  Denies one-sided weakness numbness tingling.  Had a mild headache, denies blurred vision.  She states that her blood pressure has been difficult control in the outpatient and has had several recent medication adjustments ED Course: On arrival, blood pressure was 195/68 with otherwise normal vitals.  EKG showed normal sinus rhythm.  Blood work significant for creatinine 5.64, above baseline of 5, with bicarb of 19 and potassium of 6.  Hemoglobin 9 which is about her baseline.  Urinalysis unremarkable.  Patient was treated with [facility] in the emergency room.  Hospitalist consulted for admission.  Hospital Course from Dr. Lenise Herald 7/14-7/15/21: Pt presented w/ HTN urgency as well AKI on CKD. HTN urgency resolved w/ po anti-HTN meds & HD. Pt was started on HD after a permcath was placed by vascular surg. Pt has tolerated HD pretty well so far. Pt was given outpatient HD spot prior to d/c will likely have HD MWF. For more information, please see previous progress notes.   Discharge Diagnoses:  Principal Problem:   Acute  renal failure superimposed on stage 5 chronic kidney disease, not on chronic dialysis (HCC) Active Problems:   Hyperkalemia   Anemia of chronic disease   Metabolic acidosis   Hypertensive urgency   AKI (acute kidney injury) ([place])   ESRD (end stage renal disease) (HCC)   Nonintractable headache   Constipation  ESRD: on HD. Permcath placed on 04/10/20. Given outpatient HD spot today. Nephro following and recs apprec  HTN urgency: resolved but still w/ HTN. 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 No need for a transfusion at this time. Will continue to monitor   GERD: continue on PPI   Thrombocytopenia: etiology unclear. Will continue to monitor   Discharge Instructions  Discharge Instructions    Diet - low sodium heart healthy   Complete by: As directed    Discharge instructions   Complete by: As directed    F/u PCP/regular physician in 1 -2 weeks. F/u nephro in 1 week.   Increase activity slowly   Complete by: As directed    No wound care   Complete by: As directed      Allergies as of 04/13/2020      Reactions   Amlodipine    Other reaction(s): Unknown   Azithromycin    Other reaction(s): Unknown      Medication List    STOP taking these medications   aliskiren 150 MG tablet Commonly known as: TEKTURNA     TAKE these medications   acetaminophen 500 MG tablet Commonly known as: TYLENOL Take 500 mg by  mouth as needed.   alum & mag hydroxide-simeth 200-200-20 MG/5ML suspension Commonly known as: MAALOX/MYLANTA Take 5 mLs by mouth every 6 (six) hours as needed.   atorvastatin 80 MG tablet Commonly known as: LIPITOR 80 mg.   colchicine 0.6 MG tablet Take 0.6 mg by mouth daily as needed.   hydrALAZINE 25 MG tablet Commonly known as: APRESOLINE Take 1 tablet (25 mg total) by mouth every 8 (eight) hours. What changed:   medication strength  how much to take   isosorbide mononitrate 30  MG 24 hr tablet Commonly known as: IMDUR Take 1 tablet (30 mg total) by mouth daily.   losartan 50 MG tablet Commonly known as: COZAAR Take 1 tablet (50 mg total) by mouth daily. Start taking on: April 14, 2020   metoprolol succinate 50 MG 24 hr tablet Commonly known as: TOPROL-XL Take 1 tablet (50 mg total) by mouth daily. Take with or immediately following a meal.   Omeprazole 20 MG Tbec Take 20 mg by mouth daily as needed.   tiZANidine 4 MG tablet Commonly known as: ZANAFLEX Take 4 mg by mouth at bedtime.       Allergies  Allergen Reactions  . Amlodipine     Other reaction(s): Unknown  . Azithromycin     Other reaction(s): Unknown    Consultations:  Nephro, Dr. Holley Raring  Vascular surg, Dr. Lucky Cowboy    Procedures/Studies: DG Chest 2 View  Result Date: 04/11/2020 CLINICAL DATA:  End-stage renal disease.  New dialysis starch. EXAM: CHEST - 2 VIEW COMPARISON:  One-view chest x-ray 10/07/2016 FINDINGS: Heart is enlarged, exaggerated by low lung volumes. Atherosclerotic changes are noted at the aortic arch. No edema or effusion is present. No focal airspace disease is evident. Right IJ dialysis catheter terminates at the right atrium. No pneumothorax is present. Soft tissues are otherwise unremarkable. IMPRESSION: 1. Cardiomegaly without failure. 2. Right IJ dialysis catheter terminates at the right atrium. Electronically Signed   By: San Morelle M.D.   On: 04/11/2020 16:39   PERIPHERAL VASCULAR CATHETERIZATION  Result Date: 04/10/2020 See op note     Subjective: Pt c/o intermittent headaches    Discharge Exam: Vitals:   04/13/20 0850 04/13/20 0853  BP: 120/64 120/64  Pulse: 68 74  Resp:  18  Temp:  98.7 F (37.1 C)  SpO2:  96%   Vitals:   04/13/20 0047 04/13/20 0415 04/13/20 0850 04/13/20 0853  BP: (!) 113/52 (!) 150/53 120/64 120/64  Pulse: 66 63 68 74  Resp: 16 16  18   Temp: 97.9 F (36.6 C) 98.5 F (36.9 C)  98.7 F (37.1 C)  TempSrc: Oral  Oral  Oral  SpO2: 95% 95%  96%  Weight:        General: Pt is alert, awake, not in acute distress Cardiovascular:S1/S2 +, no rubs, no gallops Respiratory: CTA bilaterally, no wheezing, no rhonchi Abdominal: Soft, NT, ND, bowel sounds + Extremities: , no cyanosis    The results of significant diagnostics from this hospitalization (including imaging, microbiology, ancillary and laboratory) are listed below for reference.     Microbiology: Recent Results (from the past 240 hour(s))  SARS Coronavirus 2 by RT PCR (hospital order, performed in [facility] hospital lab) Nasopharyngeal Nasopharyngeal Swab     Status: None   Collection Time: 04/09/20  3:02 AM   Specimen: Nasopharyngeal Swab  Result Value Ref Range Status   SARS Coronavirus 2 NEGATIVE NEGATIVE Final    Comment: (NOTE) SARS-CoV-2 target nucleic acids are  NOT DETECTED.  The SARS-CoV-2 RNA is generally detectable in upper and lower respiratory specimens during the acute phase of infection. The lowest concentration of SARS-CoV-2 viral copies this assay can detect is 250 copies / mL. A negative result does not preclude SARS-CoV-2 infection and should not be used as the sole basis for treatment or other patient management decisions.  A negative result may occur with improper specimen collection / handling, submission of specimen other than nasopharyngeal swab, presence of viral mutation(s) within the areas targeted by this assay, and inadequate number of viral copies (<250 copies / mL). A negative result must be combined with clinical observations, patient history, and epidemiological information.  Fact Sheet for Patients:   StrictlyIdeas.no  Fact Sheet for Healthcare Providers: BankingDealers.co.za  This test is not yet approved or  cleared by the [place] FDA and has been authorized for detection and/or diagnosis of SARS-CoV-2 by FDA under an Emergency Use  Authorization (EUA).  This EUA will remain in effect (meaning this test can be used) for the duration of the COVID-19 declaration under Section 564(b)(1) of the Act, 21 U.S.C. section 360bbb-3(b)(1), unless the authorization is terminated or revoked sooner.  Performed at [facility], [address] [place], [place] [address]      Labs: BNP (last 3 results) No results for input(s): BNP in the last 8760 hours. Basic Metabolic Panel: Recent Labs  Lab 04/08/20 1535 04/09/20 0302 04/10/20 0736 04/10/20 1234 04/12/20 0755 04/13/20 0313  NA 134* 138 140  --  140 137  K 6.0* 5.0 4.3  --  4.5 4.1  CL 108 110 102  --  102 95*  CO2 19* 18* 28  --  26 33*  GLUCOSE 108* 108* 115*  --  94 102*  BUN 85* 86* 75*  --  34* 23  CREATININE 5.64* 5.36* 5.19*  --  3.91* 3.09*  CALCIUM 8.1* 8.3* 7.9*  --  8.3* 8.0*  PHOS  --   --   --  4.1  --   --    Liver Function Tests: No results for input(s): AST, ALT, ALKPHOS, BILITOT, PROT, ALBUMIN in the last 168 hours. No results for input(s): LIPASE, AMYLASE in the last 168 hours. No results for input(s): AMMONIA in the last 168 hours. CBC: Recent Labs  Lab 04/08/20 1535 04/09/20 0302 04/12/20 0755 04/13/20 0313  WBC 7.4 7.2 6.6 6.7  HGB 9.0* 8.0* 9.6* 9.0*  HCT 27.8* 24.5* 28.6* 27.4*  MCV 88.0 87.5 86.1 87.0  PLT 190 230 167 138*   Cardiac Enzymes: No results for input(s): CKTOTAL, CKMB, CKMBINDEX, TROPONINI in the last 168 hours. BNP: Invalid input(s): POCBNP CBG: No results for input(s): GLUCAP in the last 168 hours. D-Dimer No results for input(s): DDIMER in the last 72 hours. Hgb A1c No results for input(s): HGBA1C in the last 72 hours. Lipid Profile No results for input(s): CHOL, HDL, LDLCALC, TRIG, CHOLHDL, LDLDIRECT in the last 72 hours. Thyroid function studies No results for input(s): TSH, T4TOTAL, T3FREE, THYROIDAB in the last 72 hours.  Invalid input(s): FREET3 Anemia work up No results for input(s):  VITAMINB12, FOLATE, FERRITIN, TIBC, IRON, RETICCTPCT in the last 72 hours. Urinalysis    Component Value Date/Time   COLORURINE COLORLESS (A) 04/08/2020 1535   APPEARANCEUR CLEAR (A) 04/08/2020 1535   LABSPEC 1.006 04/08/2020 1535   PHURINE 5.0 04/08/2020 1535   GLUCOSEU NEGATIVE 04/08/2020 1535   HGBUR NEGATIVE 04/08/2020 [place] NEGATIVE 04/08/2020 1535  KETONESUR NEGATIVE 04/08/2020 1535   PROTEINUR 100 (A) 04/08/2020 1535   NITRITE NEGATIVE 04/08/2020 1535   LEUKOCYTESUR NEGATIVE 04/08/2020 1535   Sepsis Labs Invalid input(s): PROCALCITONIN,  WBC,  LACTICIDVEN Microbiology Recent Results (from the past 240 hour(s))  SARS Coronavirus 2 by RT PCR (hospital order, performed in [facility] hospital lab) Nasopharyngeal Nasopharyngeal Swab     Status: None   Collection Time: 04/09/20  3:02 AM   Specimen: Nasopharyngeal Swab  Result Value Ref Range Status   SARS Coronavirus 2 NEGATIVE NEGATIVE Final    Comment: (NOTE) SARS-CoV-2 target nucleic acids are NOT DETECTED.  The SARS-CoV-2 RNA is generally detectable in upper and lower respiratory specimens during the acute phase of infection. The lowest concentration of SARS-CoV-2 viral copies this assay can detect is 250 copies / mL. A negative result does not preclude SARS-CoV-2 infection and should not be used as the sole basis for treatment or other patient management decisions.  A negative result may occur with improper specimen collection / handling, submission of specimen other than nasopharyngeal swab, presence of viral mutation(s) within the areas targeted by this assay, and inadequate number of viral copies (<250 copies / mL). A negative result must be combined with clinical observations, patient history, and epidemiological information.  Fact Sheet for Patients:   StrictlyIdeas.no  Fact Sheet for Healthcare Providers: BankingDealers.co.za  This test is not yet  approved or  cleared by the [place] FDA and has been authorized for detection and/or diagnosis of SARS-CoV-2 by FDA under an Emergency Use Authorization (EUA).  This EUA will remain in effect (meaning this test can be used) for the duration of the COVID-19 declaration under Section 564(b)(1) of the Act, 21 U.S.C. section 360bbb-3(b)(1), unless the authorization is terminated or revoked sooner.  Performed at [facility], [address], [place], [place] [address]      Time coordinating discharge: Over 30 minutes  SIGNED:   Wyvonnia Dusky, MD  [facility] 04/13/2020, 11:28 AM Pager   If 7PM-7AM, please contact night-coverage [email or web address]

## 2020-04-13 NOTE — Care Management Important Message (Signed)
Important Message  Patient Details  Name: Leah Davies MRN: [id number] Date of Birth: [birth year]   Medicare Important Message Given:  Yes     [name] 04/13/2020, 11:17 AM

## 2020-04-13 NOTE — Evaluation (Signed)
Physical Therapy Evaluation Patient Details Name: Leah Davies MRN: [id number] DOB: 1941/03/23 Today's Date: 04/13/2020   History of Present Illness  79 y.o. female with medical history significant for Hx of HTN, CKD stage IV and chronic venous insufficiency who presents with concerns of nausea, vomiting diarrhea and dizziness.  Clinical Impression  Pt did well with mobility, ambulation, stair negotiation and PT, pt and grandson feel good about her ability to safely return home.  BP 152/57 in supine, 101/58 on standing with mild transient lightheadedness.  Pt safe and appropriate with mobility, family available 24/7.  No needs, PT signing off.    Follow Up Recommendations No PT follow up    Equipment Recommendations  None recommended by PT    Recommendations for Other Services       Precautions / Restrictions Precautions Precautions: Fall Restrictions Weight Bearing Restrictions: No      Mobility  Bed Mobility Overal bed mobility: Independent             General bed mobility comments: Pt able to easily and confidently get to EOB  Transfers Overall transfer level: Modified independent Equipment used: Rolling walker (2 wheeled)             General transfer comment: able to rise w/o assist or excessive cuing, good confidence and safety  Ambulation/Gait Ambulation/Gait assistance: Supervision Gait Distance (Feet): 200 Feet Assistive device: Rolling walker (2 wheeled)       General Gait Details: Pt with confident and consistsent speed and cadence, good overall effort with no overt safety issues, vitals stable and appropriate.  Stairs Stairs: Yes Stairs assistance: Supervision Stair Management: One rail Left;Forwards Number of Stairs: 16 General stair comments: Pt was able to negotiate up/down steps with single rail with good safety and confidence.   Wheelchair Mobility    Modified Rankin (Stroke Patients Only)       Balance Overall balance  assessment: Modified Independent                                           Pertinent Vitals/Pain Pain Assessment: No/denies pain    Home Living Family/patient expects to be discharged to:: Private residence Living Arrangements: Children;Other relatives Available Help at Discharge: Family;Available 24 hours/day   Home Access: Stairs to enter Entrance Stairs-Rails: Left Entrance Stairs-Number of Steps: flight Home Layout: One level Home Equipment: Walker - 2 wheels      Prior Function Level of Independence: Independent with assistive device(s)         Comments: Pt normally able to remain active and independent in the home     Hand Dominance        Extremity/Trunk Assessment   Upper Extremity Assessment Upper Extremity Assessment: Overall WFL for tasks assessed    Lower Extremity Assessment Lower Extremity Assessment: Overall WFL for tasks assessed       Communication   Communication: Prefers language other than English (request to have grandson act as interpreter)  Cognition Arousal/Alertness: Awake/alert Behavior During Therapy: WFL for tasks assessed/performed Overall Cognitive Status: Within Functional Limits for tasks assessed                                        General Comments      Exercises     Assessment/Plan  PT Assessment Patent does not need any further PT services  PT Problem List         PT Treatment Interventions      PT Goals (Current goals can be found in the Care Plan section)  Acute Rehab PT Goals Patient Stated Goal: go home PT Goal Formulation: Patient unable to participate in goal setting    Frequency     Barriers to discharge        Co-evaluation               AM-PAC PT "6 Clicks" Mobility  Outcome Measure Help needed turning from your back to your side while in a flat bed without using bedrails?: None Help needed moving from lying on your back to sitting on the side of a  flat bed without using bedrails?: None Help needed moving to and from a bed to a chair (including a wheelchair)?: None Help needed standing up from a chair using your arms (e.g., wheelchair or bedside chair)?: None Help needed to walk in hospital room?: None Help needed climbing 3-5 steps with a railing? : None 6 Click Score: 24    End of Session Equipment Utilized During Treatment: Gait belt Activity Tolerance: Patient tolerated treatment well Patient left: in chair;with call bell/phone within reach;with family/visitor present Nurse Communication: Mobility status PT Visit Diagnosis: Difficulty in walking, not elsewhere classified (R26.2);Muscle weakness (generalized) (M62.81)    Time: 2197-5883 PT Time Calculation (min) (ACUTE ONLY): 24 min   Charges:   PT Evaluation $PT Eval Low Complexity: 1 Low          Kreg Shropshire, DPT 04/13/2020, 11:21 AM

## 2020-04-13 NOTE — Progress Notes (Signed)
Patient accepted at [facility] MWF 6:15am. Patient can start with clinic on Friday 7/16 if medically ready.

## 2020-04-13 NOTE — Progress Notes (Signed)
Patient discharged via private vehicle. Discharge instructions explained and given to patient's family member. No further questions at this time. No s/s of distress noted. IV and tele monitoring removed.

## 2020-06-26 ENCOUNTER — Telehealth (INDEPENDENT_AMBULATORY_CARE_PROVIDER_SITE_OTHER): Payer: Self-pay

## 2020-06-26 ENCOUNTER — Other Ambulatory Visit: Payer: Self-pay

## 2020-06-26 DIAGNOSIS — Z452 Encounter for adjustment and management of vascular access device: Secondary | ICD-10-CM | POA: Diagnosis not present

## 2020-06-26 DIAGNOSIS — Z5321 Procedure and treatment not carried out due to patient leaving prior to being seen by health care provider: Secondary | ICD-10-CM | POA: Insufficient documentation

## 2020-06-26 DIAGNOSIS — T83598A Infection and inflammatory reaction due to other prosthetic device, implant and graft in urinary system, initial encounter: Secondary | ICD-10-CM | POA: Insufficient documentation

## 2020-06-26 LAB — COMPREHENSIVE METABOLIC PANEL
ALT: 11 U/L (ref 0–44)
AST: 14 U/L — ABNORMAL LOW (ref 15–41)
Albumin: 3.9 g/dL (ref 3.5–5.0)
Alkaline Phosphatase: 103 U/L (ref 38–126)
Anion gap: 10 (ref 5–15)
BUN: 68 mg/dL — ABNORMAL HIGH (ref 8–23)
CO2: 21 mmol/L — ABNORMAL LOW (ref 22–32)
Calcium: 8.2 mg/dL — ABNORMAL LOW (ref 8.9–10.3)
Chloride: 106 mmol/L (ref 98–111)
Creatinine, Ser: 5.76 mg/dL — ABNORMAL HIGH (ref 0.44–1.00)
GFR calc Af Amer: 8 mL/min — ABNORMAL LOW (ref >60)
GFR calc non Af Amer: 6 mL/min — ABNORMAL LOW (ref >60)
Glucose, Bld: 146 mg/dL — ABNORMAL HIGH (ref 70–99)
Potassium: 5.4 mmol/L — ABNORMAL HIGH (ref 3.5–5.1)
Sodium: 137 mmol/L (ref 135–145)
Total Bilirubin: 0.6 mg/dL (ref 0.3–1.2)
Total Protein: 7.3 g/dL (ref 6.5–8.1)

## 2020-06-26 LAB — CBC
HCT: 37.4 % (ref 36.0–46.0)
Hemoglobin: 12 g/dL (ref 12.0–15.0)
MCH: 29.9 pg (ref 26.0–34.0)
MCHC: 32.1 g/dL (ref 30.0–36.0)
MCV: 93 fL (ref 80.0–100.0)
Platelets: 218 10*3/uL (ref 150–400)
RBC: 4.02 MIL/uL (ref 3.87–5.11)
RDW: 13.8 % (ref 11.5–15.5)
WBC: 7.4 10*3/uL (ref 4.0–10.5)
nRBC: 0 % (ref 0.0–0.2)

## 2020-06-26 NOTE — ED Triage Notes (Signed)
Pt states during dialysis Friday she noted pain to dialysis catheter to right chest. States she told dialysis nurse at that time but did not receive call till today. States they did not have dialysis today because they want her to have catheter checked for any abnormalities. Pt was sent here by nephrologist to get dialysis treatment and to check for possible new catheter placement.

## 2020-06-26 NOTE — Telephone Encounter (Signed)
A fax was received for the patient to have a permcath exchange. Leah Davies called also and the patient is scheduled with Dr. Delana Meyer for a permcath insert on 06/28/20 with a 2:30 pm arrival time to the [facility]. Covid testing on 06/27/20 between 8-1 pm at the [facility]. Pre-procedure instructions will be faxed to [facility] as he is in contact with the patient's son.

## 2020-06-27 ENCOUNTER — Emergency Department: Payer: Medicare HMO

## 2020-06-27 ENCOUNTER — Emergency Department
Admission: EM | Admit: 2020-06-27 | Discharge: 2020-06-27 | Disposition: A | Discharge status: Home / Self Care | Payer: Medicare HMO | Attending: Emergency Medicine | Admitting: Emergency Medicine

## 2020-06-27 ENCOUNTER — Other Ambulatory Visit (INDEPENDENT_AMBULATORY_CARE_PROVIDER_SITE_OTHER): Payer: Self-pay | Admitting: Nurse Practitioner

## 2020-06-27 ENCOUNTER — Inpatient Hospital Stay: Admission: RE | Admit: 2020-06-27 | Payer: Medicare HMO | Source: Ambulatory Visit

## 2020-06-27 DIAGNOSIS — T83598A Infection and inflammatory reaction due to other prosthetic device, implant and graft in urinary system, initial encounter: Secondary | ICD-10-CM | POA: Diagnosis not present

## 2020-06-28 ENCOUNTER — Telehealth (INDEPENDENT_AMBULATORY_CARE_PROVIDER_SITE_OTHER): Payer: Self-pay

## 2020-06-28 ENCOUNTER — Encounter: Admission: RE | Payer: Self-pay | Source: Home / Self Care

## 2020-06-28 ENCOUNTER — Ambulatory Visit: Admission: RE | Admit: 2020-06-28 | Payer: Medicare HMO | Source: Home / Self Care | Admitting: Vascular Surgery

## 2020-06-28 SURGERY — DIALYSIS/PERMA CATHETER INSERTION
Anesthesia: Moderate Sedation

## 2020-06-28 NOTE — Telephone Encounter (Signed)
Patient did not do their covid test on 06/27/20 so the permcath exchange has been canceled. I contacted Mark at [facility] regarding the patient and he stated that the patient went to the ED yesterday and left AMA and will be going to [facility] this morning.

## 2020-06-29 ENCOUNTER — Other Ambulatory Visit (INDEPENDENT_AMBULATORY_CARE_PROVIDER_SITE_OTHER): Payer: Self-pay | Admitting: Vascular Surgery

## 2020-06-29 DIAGNOSIS — N186 End stage renal disease: Secondary | ICD-10-CM

## 2020-07-05 NOTE — Progress Notes (Signed)
MRN : [id number]  Leah Davies is a 79 y.o. ([birth year]) female who presents with chief complaint of No chief complaint on file. Marland Kitchen  History of Present Illness:    The patient is seen for evaluation of dialysis access.  The patient has recently been started on HD.  No previous access in the arms or legs.  She is right handed.    Current access is via a right IJ catheter which is functioning well.  There have not been any episodes of catheter infection.  The patient denies amaurosis fugax or recent TIA symptoms. There are no recent neurological changes noted. The patient denies claudication symptoms or rest pain symptoms. The patient denies history of DVT, PE or superficial thrombophlebitis. The patient denies recent episodes of angina or shortness of breath.   Duplex ultrasound  For vein mapping shows a 4 mm vein in the antecubital fossa left arm and triphasic arterial flow  No outpatient medications have been marked as taking for the 07/06/20 encounter (Appointment) with Delana Meyer, Dolores Lory, MD.    Past Medical History:  Diagnosis Date  . Chronic kidney disease    renal insufficiency  . Complication of anesthesia    Vital signs low during colonoscopy  . Elevated lipids   . GERD (gastroesophageal reflux disease)   . Gout   . Hypertension     Past Surgical History:  Procedure Laterality Date  . BREAST EXCISIONAL BIOPSY Left 2010   benign  . BREAST SURGERY     Lt breast biopsy  . DIALYSIS/PERMA CATHETER INSERTION N/A 04/10/2020   Procedure: DIALYSIS/PERMA CATHETER INSERTION;  Surgeon: Algernon Huxley, MD;  Location: [facility];  Service: Cardiovascular;  Laterality: N/A;  . KNEE ARTHROSCOPY Left 10/05/2015   Procedure: ARTHROSCOPY KNEE partial medial and lateral meniscectomy, lateral release for sublux patella;  Surgeon: Hessie Knows, MD;  Location: [facility];  Service: Orthopedics;  Laterality: Left;    Social History Social History   Tobacco Use  . Smoking status:  Never Smoker  . Smokeless tobacco: Never Used  Substance Use Topics  . Alcohol use: Yes    Comment: rare  . Drug use: No    Family History Family History  Problem Relation Age of Onset  . Breast cancer Sister 69  No family history of bleeding/clotting disorders, porphyria or autoimmune disease   Allergies  Allergen Reactions  . Amlodipine     Other reaction(s): Unknown  . Azithromycin     Other reaction(s): Unknown     REVIEW OF SYSTEMS (Negative unless checked)  Constitutional: [] Weight loss  [] Fever  [] Chills Cardiac: [] Chest pain   [] Chest pressure   [] Palpitations   [] Shortness of breath when laying flat   [] Shortness of breath with exertion. Vascular:  [] Pain in legs with walking   [] Pain in legs at rest  [] History of DVT   [] Phlebitis   [] Swelling in legs   [x] Varicose veins   [] Non-healing ulcers Pulmonary:   [] Uses home oxygen   [] Productive cough   [] Hemoptysis   [] Wheeze  [] COPD   [] Asthma Neurologic:  [] Dizziness   [] Seizures   [] History of stroke   [] History of TIA  [] Aphasia   [] Vissual changes   [] Weakness or numbness in arm   [] Weakness or numbness in leg Musculoskeletal:   [] Joint swelling   [] Joint pain   [] Low back pain Hematologic:  [] Easy bruising  [] Easy bleeding   [] Hypercoagulable state   [] Anemic Gastrointestinal:  [] Diarrhea   [] Vomiting  [x] Gastroesophageal  reflux/heartburn   [] Difficulty swallowing. Genitourinary:  [x] Chronic kidney disease   [] Difficult urination  [] Frequent urination   [] Blood in urine Skin:  [] Rashes   [] Ulcers  Psychological:  [] History of anxiety   []  History of major depression.  Physical Examination  There were no vitals filed for this visit. There is no height or weight on file to calculate BMI. Gen: WD/WN, NAD Head: [place]/AT, No temporalis wasting.  Ear/Nose/Throat: Hearing grossly intact, nares w/o erythema or drainage, poor dentition Eyes: PER, EOMI, sclera nonicteric.  Neck: Supple, no masses.  No bruit or JVD.    Pulmonary:  Good air movement, clear to auscultation bilaterally, no use of accessory muscles.  Cardiac: RRR, normal S1, S2, no Murmurs. Vascular: right IJ cath CD&I Vessel Right Left  Radial Palpable Palpable  Brachial Palpable Palpable  Gastrointestinal: soft, non-distended. No guarding/no peritoneal signs.  Musculoskeletal: M/S 5/5 throughout.  No deformity or atrophy.  Neurologic: CN 2-12 intact. Pain and light touch intact in extremities.  Symmetrical.  Speech is fluent. Motor exam as listed above. Psychiatric: Judgment intact, Mood & affect appropriate for pt's clinical situation. Dermatologic: No rashes or ulcers noted.  No changes consistent with cellulitis. Lymph : No Cervical lymphadenopathy, no lichenification or skin changes of chronic lymphedema.  CBC Lab Results  Component Value Date   WBC 7.4 06/26/2020   HGB 12.0 06/26/2020   HCT 37.4 06/26/2020   MCV 93.0 06/26/2020   PLT 218 06/26/2020    BMET    Component Value Date/Time   NA 137 06/26/2020 1925   K 5.4 (H) 06/26/2020 1925   CL 106 06/26/2020 1925   CO2 21 (L) 06/26/2020 1925   GLUCOSE 146 (H) 06/26/2020 1925   BUN 68 (H) 06/26/2020 1925   CREATININE 5.76 (H) 06/26/2020 1925   CALCIUM 8.2 (L) 06/26/2020 1925   GFRNONAA 6 (L) 06/26/2020 1925   GFRAA 8 (L) 06/26/2020 1925   Estimated Creatinine Clearance: 6.9 mL/min (A) (by C-G formula based on SCr of 5.76 mg/dL (H)).  COAG No results found for: INR, PROTIME  Radiology DG Chest 2 View  Result Date: 06/27/2020 CLINICAL DATA:  TDC positioning.  Chronic renal disease. EXAM: CHEST - 2 VIEW COMPARISON:  04/11/2020. FINDINGS: Dual-lumen central venous catheter noted with tip over right atrium. Heart size normal. Low lung volumes. Mild bibasilar atelectasis. No focal infiltrate. No pleural effusion or pneumothorax. Mild thoracic spine scoliosis. IMPRESSION: 1.  Dual-lumen central venous catheter with tip over right atrium. 2.  Low lung volumes with mild  bibasilar atelectasis. Electronically Signed   By: Marcello Moores  Register   On: 06/27/2020 10:29     Assessment/Plan 1. ESRD (end stage renal disease) ([place]) Recommend:  At this time the patient does not have appropriate extremity access for dialysis  Patient should have a left brachial cephalic fistula created.  The risks, benefits and alternative therapies were reviewed in detail with the patient.  All questions were answered.  The patient agrees to proceed with surgery.    2. Chronic venous insufficiency No surgery or intervention at this point in time.  I have reviewed my discussion with the patient regarding venous insufficiency and why it causes symptoms. I have discussed with the patient the chronic skin changes that accompany venous insufficiency and the long term sequela such as ulceration. Patient will contnue wearing graduated compression stockings on a daily basis, as this has provided excellent control of his edema. The patient will put the stockings on first thing in the morning and  removing them in the evening. The patient is reminded not to sleep in the stockings.  In addition, behavioral modification including elevation during the day will be initiated. Exercise is strongly encouraged.  Given the patient's good control and lack of any problems regarding the venous insufficiency and lymphedema a lymph pump in not need at this time.    3. Essential hypertension Continue antihypertensive medications as already ordered, these medications have been reviewed and there are no changes at this time.   4. Gastroesophageal reflux disease, unspecified whether esophagitis present Continue PPI as already ordered, this medication has been reviewed and there are no changes at this time.  Avoidence of caffeine and alcohol  Moderate elevation of the head of the bed   Hortencia Pilar, MD  07/05/2020 9:19 AM

## 2020-07-05 NOTE — H&P (View-Only) (Signed)
MRN : [id number]  Leah Davies is a 79 y.o. (1941-01-13) female who presents with chief complaint of No chief complaint on file. Marland Kitchen  History of Present Illness:    The patient is seen for evaluation of dialysis access.  The patient has recently been started on HD.  No previous access in the arms or legs.  She is right handed.    Current access is via a right IJ catheter which is functioning well.  There have not been any episodes of catheter infection.  The patient denies amaurosis fugax or recent TIA symptoms. There are no recent neurological changes noted. The patient denies claudication symptoms or rest pain symptoms. The patient denies history of DVT, PE or superficial thrombophlebitis. The patient denies recent episodes of angina or shortness of breath.   Duplex ultrasound  For vein mapping shows a 4 mm vein in the antecubital fossa left arm and triphasic arterial flow  No outpatient medications have been marked as taking for the 07/06/20 encounter (Appointment) with Delana Meyer, Dolores Lory, MD.    Past Medical History:  Diagnosis Date  . Chronic kidney disease    renal insufficiency  . Complication of anesthesia    Vital signs low during colonoscopy  . Elevated lipids   . GERD (gastroesophageal reflux disease)   . Gout   . Hypertension     Past Surgical History:  Procedure Laterality Date  . BREAST EXCISIONAL BIOPSY Left 2010   benign  . BREAST SURGERY     Lt breast biopsy  . DIALYSIS/PERMA CATHETER INSERTION N/A 04/10/2020   Procedure: DIALYSIS/PERMA CATHETER INSERTION;  Surgeon: Algernon Huxley, MD;  Location: [facility];  Service: Cardiovascular;  Laterality: N/A;  . KNEE ARTHROSCOPY Left 10/05/2015   Procedure: ARTHROSCOPY KNEE partial medial and lateral meniscectomy, lateral release for sublux patella;  Surgeon: Hessie Knows, MD;  Location: [facility];  Service: Orthopedics;  Laterality: Left;    Social History Social History   Tobacco Use  . Smoking status:  Never Smoker  . Smokeless tobacco: Never Used  Substance Use Topics  . Alcohol use: Yes    Comment: rare  . Drug use: No    Family History Family History  Problem Relation Age of Onset  . Breast cancer Sister 54  No family history of bleeding/clotting disorders, porphyria or autoimmune disease   Allergies  Allergen Reactions  . Amlodipine     Other reaction(s): Unknown  . Azithromycin     Other reaction(s): Unknown     REVIEW OF SYSTEMS (Negative unless checked)  Constitutional: [] Weight loss  [] Fever  [] Chills Cardiac: [] Chest pain   [] Chest pressure   [] Palpitations   [] Shortness of breath when laying flat   [] Shortness of breath with exertion. Vascular:  [] Pain in legs with walking   [] Pain in legs at rest  [] History of DVT   [] Phlebitis   [] Swelling in legs   [x] Varicose veins   [] Non-healing ulcers Pulmonary:   [] Uses home oxygen   [] Productive cough   [] Hemoptysis   [] Wheeze  [] COPD   [] Asthma Neurologic:  [] Dizziness   [] Seizures   [] History of stroke   [] History of TIA  [] Aphasia   [] Vissual changes   [] Weakness or numbness in arm   [] Weakness or numbness in leg Musculoskeletal:   [] Joint swelling   [] Joint pain   [] Low back pain Hematologic:  [] Easy bruising  [] Easy bleeding   [] Hypercoagulable state   [] Anemic Gastrointestinal:  [] Diarrhea   [] Vomiting  [x] Gastroesophageal  reflux/heartburn   [] Difficulty swallowing. Genitourinary:  [x] Chronic kidney disease   [] Difficult urination  [] Frequent urination   [] Blood in urine Skin:  [] Rashes   [] Ulcers  Psychological:  [] History of anxiety   []  History of major depression.  Physical Examination  There were no vitals filed for this visit. There is no height or weight on file to calculate BMI. Gen: WD/WN, NAD Head: [place]/AT, No temporalis wasting.  Ear/Nose/Throat: Hearing grossly intact, nares w/o erythema or drainage, poor dentition Eyes: PER, EOMI, sclera nonicteric.  Neck: Supple, no masses.  No bruit or JVD.    Pulmonary:  Good air movement, clear to auscultation bilaterally, no use of accessory muscles.  Cardiac: RRR, normal S1, S2, no Murmurs. Vascular: right IJ cath CD&I Vessel Right Left  Radial Palpable Palpable  Brachial Palpable Palpable  Gastrointestinal: soft, non-distended. No guarding/no peritoneal signs.  Musculoskeletal: M/S 5/5 throughout.  No deformity or atrophy.  Neurologic: CN 2-12 intact. Pain and light touch intact in extremities.  Symmetrical.  Speech is fluent. Motor exam as listed above. Psychiatric: Judgment intact, Mood & affect appropriate for pt's clinical situation. Dermatologic: No rashes or ulcers noted.  No changes consistent with cellulitis. Lymph : No Cervical lymphadenopathy, no lichenification or skin changes of chronic lymphedema.  CBC Lab Results  Component Value Date   WBC 7.4 06/26/2020   HGB 12.0 06/26/2020   HCT 37.4 06/26/2020   MCV 93.0 06/26/2020   PLT 218 06/26/2020    BMET    Component Value Date/Time   NA 137 06/26/2020 1925   K 5.4 (H) 06/26/2020 1925   CL 106 06/26/2020 1925   CO2 21 (L) 06/26/2020 1925   GLUCOSE 146 (H) 06/26/2020 1925   BUN 68 (H) 06/26/2020 1925   CREATININE 5.76 (H) 06/26/2020 1925   CALCIUM 8.2 (L) 06/26/2020 1925   GFRNONAA 6 (L) 06/26/2020 1925   GFRAA 8 (L) 06/26/2020 1925   Estimated Creatinine Clearance: 6.9 mL/min (A) (by C-G formula based on SCr of 5.76 mg/dL (H)).  COAG No results found for: INR, PROTIME  Radiology DG Chest 2 View  Result Date: 06/27/2020 CLINICAL DATA:  TDC positioning.  Chronic renal disease. EXAM: CHEST - 2 VIEW COMPARISON:  04/11/2020. FINDINGS: Dual-lumen central venous catheter noted with tip over right atrium. Heart size normal. Low lung volumes. Mild bibasilar atelectasis. No focal infiltrate. No pleural effusion or pneumothorax. Mild thoracic spine scoliosis. IMPRESSION: 1.  Dual-lumen central venous catheter with tip over right atrium. 2.  Low lung volumes with mild  bibasilar atelectasis. Electronically Signed   By: Marcello Moores  Register   On: 06/27/2020 10:29     Assessment/Plan 1. ESRD (end stage renal disease) ([place]) Recommend:  At this time the patient does not have appropriate extremity access for dialysis  Patient should have a left brachial cephalic fistula created.  The risks, benefits and alternative therapies were reviewed in detail with the patient.  All questions were answered.  The patient agrees to proceed with surgery.    2. Chronic venous insufficiency No surgery or intervention at this point in time.  I have reviewed my discussion with the patient regarding venous insufficiency and why it causes symptoms. I have discussed with the patient the chronic skin changes that accompany venous insufficiency and the long term sequela such as ulceration. Patient will contnue wearing graduated compression stockings on a daily basis, as this has provided excellent control of his edema. The patient will put the stockings on first thing in the morning and  removing them in the evening. The patient is reminded not to sleep in the stockings.  In addition, behavioral modification including elevation during the day will be initiated. Exercise is strongly encouraged.  Given the patient's good control and lack of any problems regarding the venous insufficiency and lymphedema a lymph pump in not need at this time.    3. Essential hypertension Continue antihypertensive medications as already ordered, these medications have been reviewed and there are no changes at this time.   4. Gastroesophageal reflux disease, unspecified whether esophagitis present Continue PPI as already ordered, this medication has been reviewed and there are no changes at this time.  Avoidence of caffeine and alcohol  Moderate elevation of the head of the bed   Hortencia Pilar, MD  07/05/2020 9:19 AM

## 2020-07-06 ENCOUNTER — Ambulatory Visit (INDEPENDENT_AMBULATORY_CARE_PROVIDER_SITE_OTHER): Payer: Medicare HMO

## 2020-07-06 ENCOUNTER — Ambulatory Visit (INDEPENDENT_AMBULATORY_CARE_PROVIDER_SITE_OTHER): Payer: Medicare HMO | Admitting: Vascular Surgery

## 2020-07-06 ENCOUNTER — Telehealth (INDEPENDENT_AMBULATORY_CARE_PROVIDER_SITE_OTHER): Payer: Self-pay

## 2020-07-06 ENCOUNTER — Other Ambulatory Visit: Payer: Self-pay

## 2020-07-06 ENCOUNTER — Encounter (INDEPENDENT_AMBULATORY_CARE_PROVIDER_SITE_OTHER): Payer: Self-pay | Admitting: Vascular Surgery

## 2020-07-06 VITALS — BP 185/96 | HR 63 | Ht 59.0 in | Wt 147.0 lb

## 2020-07-06 DIAGNOSIS — I10 Essential (primary) hypertension: Secondary | ICD-10-CM | POA: Diagnosis not present

## 2020-07-06 DIAGNOSIS — N186 End stage renal disease: Secondary | ICD-10-CM | POA: Diagnosis not present

## 2020-07-06 DIAGNOSIS — I872 Venous insufficiency (chronic) (peripheral): Secondary | ICD-10-CM

## 2020-07-06 DIAGNOSIS — K219 Gastro-esophageal reflux disease without esophagitis: Secondary | ICD-10-CM | POA: Diagnosis not present

## 2020-07-06 NOTE — Telephone Encounter (Signed)
I attempted to contact the patient's daughter as she is the person that schedules all appt's and I was unable to leave a message due to the voice mail not being available.

## 2020-07-10 NOTE — Telephone Encounter (Signed)
I contacted Mark at [employer] and let him know that the patient is scheduled and I will be faxing over her surgery information and mailing it as well. Patient is scheduled with Dr. Delana Meyer for a left AVF on 07/28/20 at the [facility]. Pre-op is on 07/19/20 at 9:00 am at the [facility]. Covid testing on 07/26/20 between 8-1 pm at the [facility]. Pre-surgical instructions will be faxed and mailed.

## 2020-07-10 NOTE — Telephone Encounter (Signed)
I attempted to contact the patient's daughter again today to discuss pre-surgical instructions, dates and times. I was unable to leave a message due to her not having a voicemail box available.

## 2020-07-18 ENCOUNTER — Other Ambulatory Visit (INDEPENDENT_AMBULATORY_CARE_PROVIDER_SITE_OTHER): Payer: Self-pay | Admitting: Nurse Practitioner

## 2020-07-19 ENCOUNTER — Inpatient Hospital Stay: Admission: RE | Admit: 2020-07-19 | Payer: Medicare HMO | Source: Ambulatory Visit

## 2020-07-20 ENCOUNTER — Other Ambulatory Visit: Payer: Self-pay

## 2020-07-20 ENCOUNTER — Encounter
Admission: RE | Admit: 2020-07-20 | Discharge: 2020-07-20 | Disposition: A | Discharge status: Home / Self Care | Payer: Medicare HMO | Source: Ambulatory Visit | Attending: Vascular Surgery | Admitting: Vascular Surgery

## 2020-07-20 NOTE — Patient Instructions (Signed)
Your procedure is scheduled on: Friday July 28, 2020. Su procedimiento est programado para: [facility] 2021. Report to Day Surgery inside [facility] 2nd floor. Presntese a: [facility] 2nd[facility] piso. To find out your arrival time please call [phone] between 1PM - 3PM on Thursday July 27, 2020. Para saber su hora de llegada por favor llame al [phone] Leah Davies la 1PM - 3PM el da: Leah Davies, Antoine, del 2021.  Remember: Instructions that are not followed completely may result in serious medical risk, up to and including death,  or upon the discretion of your surgeon and anesthesiologist your surgery may need to be rescheduled.  Recuerde: Las instrucciones que no se siguen completamente Heritage manager en un riesgo de salud grave, incluyendo hasta  la [place] o a discrecin de su cirujano y Environmental health practitioner, su ciruga se puede posponer.   __X_ 1.DO NOT EAT food after midnight the night before your procedure. No    gum chewing or hard candies. You may drink clear liquids up to 2 hours     before you are scheduled to arrive for your surgery- DO not drink clear     Liquids within 2 hours of the start of your surgery.     Clear Liquids include:    water, apple juice without pulp, clear carbohydrate drink such as    Clearfast of [place]ade, Black Coffee or Tea (Do not add anything to coffee or tea).      NO COMA NADA despus de la medianoche de la noche anterior a su    procedimiento. No coma chicles ni caramelos duros. Puede tomar    lquidos claros hasta 2 horas antes de su hora programada de llegada al     hospital para su procedimiento. No tome lquidos claros durante el     transcurso de las 2 horas de su llegada programada al hospital para su     procedimiento, ya que esto puede llevar a que su procedimiento se    retrase o tenga que volver a Health and safety inspector.  Los lquidos claros incluyen:          - Agua o jugo de [place] sin  pulpa          - Bebidas claras con carbohidratos como ClearFast o Gatorade          - Caf negro o t claro (sin leche, sin cremas, no agregue nada al caf ni al t)  No tome nada que no est en esta lista.  Los pacientes con diabetes tipo 1 y tipo 2 solo deben Agricultural engineer.  Llame a la clnica de [facility] o a la unidad de Same Day Surgery si  tiene alguna pregunta sobre estas instrucciones.              _X__ 2.Do Not Smoke or use e-cigarettes For 24 Hours Prior to Your Surgery.    Do not use any chewable tobacco products for at least 6   hours prior to surgery.    No fume ni use cigarrillos electrnicos durante las 24 horas previas    a su [place].  No use ningn producto de tabaco masticable durante   al menos 6 horas antes de la [place].     __X_ 3. No alcohol for 24 hours before or after surgery.    No tome alcohol durante las 24 horas antes ni despus de la [place].   __x__4. Brush your teeth as normal, Do not swallow any  toothpaste or mouthwash.  day of surgery if instructed.    Cepillese los dientes como usted normalmente lo hace, no se trague la pasta de dientes ni enjuangue buccal.    __x__ 5. Notify your doctor if there is any change in your medical condition (cold,fever, infections).    Informe a su mdico si hay algn cambio en su condicin mdica  (resfriado, fiebre, infecciones).   Do not wear jewelry, make-up, hairpins, clips or nail polish.  No use joyas, maquillajes, pinzas/ganchos para el cabello ni esmalte de uas.  Do not wear lotions, powders, or perfumes. You may wear deodorant.  No use lociones, polvos o perfumes.  Puede usar desodorante.    Do not shave 48 hours prior to surgery. Men may shave face and neck.  No se afeite 48 horas antes de la [place].  Los hombres pueden [employer] cara  y el cuello.   Do not bring valuables to the hospital.   No lleve objetos [place] is not responsible for any belongings or valuables.  [place]  no se hace responsable de ningn tipo de pertenencias u objetos de Geographical information systems officer.               Contacts, dentures or bridgework may not be worn into surgery.  Los lentes de [place], las dentaduras postizas o puentes no se pueden usar en la [place].   Leave your suitcase in the car. After surgery it may be brought to your room.  Deje su maleta en el auto.  Despus de la ciruga podr traerla a su habitacin.   For patients admitted to the hospital, discharge time is determined by your  treatment team.  Para los pacientes que sean ingresados al hospital, el tiempo en el cual se le  dar de alta es determinado por su equipo de [place].   Patients discharged the day of surgery will not be allowed to drive home. A los pacientes que se les da de alta el mismo da de la ciruga no se les permitir conducir a Holiday representative.   __x__ Take these medicines the morning of surgery with A SIP OF WATER:          Tome estas medicinas la maana de la ciruga con UN SORBO DE AGUA:  1. hydrALAZINE (APRESOLINE) 25 MG   2. omeprazole (PRILOSEC) 20 MG   3. metoprolol succinate (TOPROL-XL) 25 MG 24  4. isosorbide mononitrate (IMDUR) 30 MG  5. acetaminophen (TYLENOL) (if needed)    ____ Fleet Enema (as directed)          Enema de Fleet (segn lo indicado)    __x__ Use CHG Soap as directed          Utilice el jabn de CHG segn lo indicado  ____ Use inhalers on the day of surgery          Use los inhaladores el da de la ciruga  ____ Stop metformin 2 days prior to surgery          Deje de tomar el metformin 2 das antes de la ciruga    ____ Take 1/2 of usual insulin dose the night before surgery and none on the morning of surgery           Tome la mitad de la dosis habitual de insulina la noche antes de la [place] y no tome nada en la maana de la             Leisure centre manager  ____ Stop Coumadin/Plavix/aspirin on           Deje de tomar el Coumadin/Plavix/aspirina el da:  __x__ Stop Anti-inflammatories such as  Ibuprofen, Aleve, Advil, naproxen, aspirin and or BC powders.           Deje de tomar antiinflamatorios como Ibuprofen, Aleve, Advil, naproxen, aspirinas, o polvos de BC powders.    __x__ Stop supplements until after surgery            Deje de tomar suplementos hasta despus de la ciruga  __x__ Do not start any herbal supplements before your procedure.           No empiece a tomar supplementos de hierbas antes de su cirujia.

## 2020-07-25 ENCOUNTER — Encounter
Admission: RE | Admit: 2020-07-25 | Discharge: 2020-07-25 | Disposition: A | Discharge status: Home / Self Care | Payer: Medicare HMO | Source: Ambulatory Visit | Attending: Vascular Surgery | Admitting: Vascular Surgery

## 2020-07-25 ENCOUNTER — Other Ambulatory Visit: Payer: Self-pay

## 2020-07-25 DIAGNOSIS — R7989 Other specified abnormal findings of blood chemistry: Secondary | ICD-10-CM | POA: Diagnosis not present

## 2020-07-25 DIAGNOSIS — Z01818 Encounter for other preprocedural examination: Secondary | ICD-10-CM | POA: Diagnosis present

## 2020-07-25 LAB — CBC WITH DIFFERENTIAL/PLATELET
Abs Immature Granulocytes: 0.02 10*3/uL (ref 0.00–0.07)
Basophils Absolute: 0 10*3/uL (ref 0.0–0.1)
Basophils Relative: 0 %
Eosinophils Absolute: 0.1 10*3/uL (ref 0.0–0.5)
Eosinophils Relative: 2 %
HCT: 35.7 % — ABNORMAL LOW (ref 36.0–46.0)
Hemoglobin: 12 g/dL (ref 12.0–15.0)
Immature Granulocytes: 0 %
Lymphocytes Relative: 37 %
Lymphs Abs: 2.8 10*3/uL (ref 0.7–4.0)
MCH: 30 pg (ref 26.0–34.0)
MCHC: 33.6 g/dL (ref 30.0–36.0)
MCV: 89.3 fL (ref 80.0–100.0)
Monocytes Absolute: 0.4 10*3/uL (ref 0.1–1.0)
Monocytes Relative: 6 %
Neutro Abs: 4.2 10*3/uL (ref 1.7–7.7)
Neutrophils Relative %: 55 %
Platelets: 162 10*3/uL (ref 150–400)
RBC: 4 MIL/uL (ref 3.87–5.11)
RDW: 12.7 % (ref 11.5–15.5)
WBC: 7.5 10*3/uL (ref 4.0–10.5)
nRBC: 0 % (ref 0.0–0.2)

## 2020-07-25 LAB — PROTIME-INR
INR: 1 (ref 0.8–1.2)
Prothrombin Time: 12.5 seconds (ref 11.4–15.2)

## 2020-07-25 LAB — APTT: aPTT: 34 seconds (ref 24–36)

## 2020-07-25 LAB — BASIC METABOLIC PANEL
Anion gap: 11 (ref 5–15)
BUN: 28 mg/dL — ABNORMAL HIGH (ref 8–23)
CO2: 25 mmol/L (ref 22–32)
Calcium: 8.6 mg/dL — ABNORMAL LOW (ref 8.9–10.3)
Chloride: 95 mmol/L — ABNORMAL LOW (ref 98–111)
Creatinine, Ser: 3.95 mg/dL — ABNORMAL HIGH (ref 0.44–1.00)
GFR, Estimated: 11 mL/min — ABNORMAL LOW (ref >60)
Glucose, Bld: 94 mg/dL (ref 70–99)
Potassium: 4.9 mmol/L (ref 3.5–5.1)
Sodium: 131 mmol/L — ABNORMAL LOW (ref 135–145)

## 2020-07-25 LAB — TYPE AND SCREEN
ABO/RH(D): O POS
Antibody Screen: NEGATIVE

## 2020-07-25 NOTE — Progress Notes (Signed)
  [facility] Perioperative Services: Pre-Admission/Anesthesia Testing  Abnormal Lab Notification   Date: 07/25/20  Name: SHOUA ULLOA MRN:   [id number]  Re: Abnormal labs noted during PAT appointment   Provider(s) Notified: Delana Meyer Dolores Lory, MD  Notification mode: Routed and/or faxed via [place] LAB VALUE(S): Lab Results  Component Value Date   NA 131 (L) 07/25/2020   BUN 28 (H) 07/25/2020   CREATININE 3.95 (H) 07/25/2020     Notes:  Patient with ESRD diagnosis. Patient recently started hemodialysis via a RIJ catheter. She is scheduled for an AV fistula creation on 07/28/2020.    Na levels trended back to 09/2016 through the present; range 133 - 143 mmol/L.  Creatinine range from 09/2016 through the present has been 2.07 - 5.76 mg/dL.   This is a Community education officer; no formal response is required.  Honor Loh, MSN, APRN, FNP-C, CEN [facility]  Peri-operative Services Nurse Practitioner Phone: [phone] 07/25/20 11:27 AM

## 2020-07-26 ENCOUNTER — Other Ambulatory Visit
Admission: RE | Admit: 2020-07-26 | Discharge: 2020-07-26 | Disposition: A | Discharge status: Home / Self Care | Payer: Medicare HMO | Source: Ambulatory Visit | Attending: Vascular Surgery | Admitting: Vascular Surgery

## 2020-07-26 DIAGNOSIS — Z20822 Contact with and (suspected) exposure to covid-19: Secondary | ICD-10-CM | POA: Diagnosis not present

## 2020-07-26 DIAGNOSIS — Z01812 Encounter for preprocedural laboratory examination: Secondary | ICD-10-CM | POA: Insufficient documentation

## 2020-07-26 LAB — SARS CORONAVIRUS 2 (TAT 6-24 HRS): SARS Coronavirus 2: NEGATIVE

## 2020-07-28 ENCOUNTER — Ambulatory Visit
Admission: RE | Admit: 2020-07-28 | Discharge: 2020-07-28 | Disposition: A | Discharge status: Home / Self Care | Payer: Medicare HMO | Attending: Vascular Surgery | Admitting: Vascular Surgery

## 2020-07-28 ENCOUNTER — Ambulatory Visit: Payer: Medicare HMO | Admitting: Certified Registered"

## 2020-07-28 ENCOUNTER — Encounter
Admission: RE | Disposition: A | Discharge status: Home / Self Care | Payer: Self-pay | Source: Home / Self Care | Attending: Vascular Surgery

## 2020-07-28 ENCOUNTER — Encounter: Payer: Self-pay | Admitting: Vascular Surgery

## 2020-07-28 ENCOUNTER — Other Ambulatory Visit: Payer: Self-pay

## 2020-07-28 DIAGNOSIS — N186 End stage renal disease: Secondary | ICD-10-CM | POA: Diagnosis not present

## 2020-07-28 DIAGNOSIS — K219 Gastro-esophageal reflux disease without esophagitis: Secondary | ICD-10-CM | POA: Diagnosis not present

## 2020-07-28 DIAGNOSIS — I120 Hypertensive chronic kidney disease with stage 5 chronic kidney disease or end stage renal disease: Secondary | ICD-10-CM | POA: Diagnosis present

## 2020-07-28 DIAGNOSIS — I872 Venous insufficiency (chronic) (peripheral): Secondary | ICD-10-CM | POA: Diagnosis not present

## 2020-07-28 DIAGNOSIS — N185 Chronic kidney disease, stage 5: Secondary | ICD-10-CM | POA: Diagnosis not present

## 2020-07-28 HISTORY — PX: AV FISTULA PLACEMENT: SHX1204

## 2020-07-28 LAB — POCT I-STAT, CHEM 8
BUN: 48 mg/dL — ABNORMAL HIGH (ref 8–23)
Calcium, Ion: 0.78 mmol/L — CL (ref 1.15–1.40)
Chloride: 110 mmol/L (ref 98–111)
Creatinine, Ser: 5.5 mg/dL — ABNORMAL HIGH (ref 0.44–1.00)
Glucose, Bld: 85 mg/dL (ref 70–99)
HCT: 35 % — ABNORMAL LOW (ref 36.0–46.0)
Hemoglobin: 11.9 g/dL — ABNORMAL LOW (ref 12.0–15.0)
Potassium: 4.4 mmol/L (ref 3.5–5.1)
Sodium: 133 mmol/L — ABNORMAL LOW (ref 135–145)
TCO2: 18 mmol/L — ABNORMAL LOW (ref 22–32)

## 2020-07-28 LAB — ABO/RH: ABO/RH(D): O POS

## 2020-07-28 SURGERY — ARTERIOVENOUS (AV) FISTULA CREATION
Anesthesia: General | Laterality: Left

## 2020-07-28 MED ORDER — HYDROCODONE-ACETAMINOPHEN 5-325 MG PO TABS
1.0000 | ORAL_TABLET | Freq: Four times a day (QID) | ORAL | 0 refills | Status: DC | PRN
Start: 2020-07-28 — End: 2020-12-25

## 2020-07-28 MED ORDER — PROPOFOL 500 MG/50ML IV EMUL
INTRAVENOUS | Status: AC
  Filled 2020-07-28: qty 50

## 2020-07-28 MED ORDER — FENTANYL CITRATE (PF) 100 MCG/2ML IJ SOLN
INTRAMUSCULAR | Status: DC | PRN
Start: 2020-07-28 — End: 2020-07-28
  Administered 2020-07-28 (×2): 50 ug via INTRAVENOUS

## 2020-07-28 MED ORDER — FENTANYL CITRATE (PF) 100 MCG/2ML IJ SOLN: 25.0000 ug | INTRAMUSCULAR | Status: DC | PRN

## 2020-07-28 MED ORDER — PHENYLEPHRINE HCL (PRESSORS) 10 MG/ML IV SOLN
INTRAVENOUS | Status: DC | PRN
  Administered 2020-07-28: 100 ug via INTRAVENOUS

## 2020-07-28 MED ORDER — SODIUM CHLORIDE 0.9 % IV SOLN: INTRAVENOUS | Status: DC

## 2020-07-28 MED ORDER — OXYCODONE HCL 5 MG PO TABS: 5.0000 mg | ORAL_TABLET | Freq: Once | ORAL | Status: DC | PRN

## 2020-07-28 MED ORDER — OXYCODONE HCL 5 MG/5ML PO SOLN: 5.0000 mg | Freq: Once | ORAL | Status: DC | PRN

## 2020-07-28 MED ORDER — CHLORHEXIDINE GLUCONATE 0.12 % MT SOLN
OROMUCOSAL | Status: AC
  Filled 2020-07-28: qty 15

## 2020-07-28 MED ORDER — GLYCOPYRROLATE 0.2 MG/ML IJ SOLN
INTRAMUSCULAR | Status: DC | PRN
  Administered 2020-07-28: .2 mg via INTRAVENOUS

## 2020-07-28 MED ORDER — PROMETHAZINE HCL 25 MG/ML IJ SOLN
INTRAMUSCULAR | Status: AC
  Filled 2020-07-28: qty 1

## 2020-07-28 MED ORDER — BUPIVACAINE LIPOSOME 1.3 % IJ SUSP
INTRAMUSCULAR | Status: DC | PRN
  Administered 2020-07-28: 17 mL

## 2020-07-28 MED ORDER — CEFAZOLIN SODIUM-DEXTROSE 1-4 GM/50ML-% IV SOLN
INTRAVENOUS | Status: AC
  Filled 2020-07-28: qty 50

## 2020-07-28 MED ORDER — EPHEDRINE SULFATE 50 MG/ML IJ SOLN
INTRAMUSCULAR | Status: DC | PRN
  Administered 2020-07-28: 5 mg via INTRAVENOUS

## 2020-07-28 MED ORDER — MIDAZOLAM HCL 2 MG/2ML IJ SOLN
INTRAMUSCULAR | Status: AC
  Filled 2020-07-28: qty 2

## 2020-07-28 MED ORDER — CHLORHEXIDINE GLUCONATE CLOTH 2 % EX PADS: 6.0000 | MEDICATED_PAD | Freq: Once | CUTANEOUS | Status: DC

## 2020-07-28 MED ORDER — PROMETHAZINE HCL 25 MG/ML IJ SOLN
6.2500 mg | INTRAMUSCULAR | Status: DC | PRN
  Administered 2020-07-28: 6.25 mg via INTRAVENOUS

## 2020-07-28 MED ORDER — FENTANYL CITRATE (PF) 100 MCG/2ML IJ SOLN
INTRAMUSCULAR | Status: AC
  Filled 2020-07-28: qty 2

## 2020-07-28 MED ORDER — ORAL CARE MOUTH RINSE: 15.0000 mL | Freq: Once | OROMUCOSAL | Status: DC

## 2020-07-28 MED ORDER — ROCURONIUM BROMIDE 100 MG/10ML IV SOLN
INTRAVENOUS | Status: DC | PRN
  Administered 2020-07-28: 200 mg via INTRAVENOUS
  Administered 2020-07-28: 40 mg via INTRAVENOUS

## 2020-07-28 MED ORDER — DEXAMETHASONE SODIUM PHOSPHATE 10 MG/ML IJ SOLN
INTRAMUSCULAR | Status: DC | PRN
  Administered 2020-07-28: 10 mg via INTRAVENOUS

## 2020-07-28 MED ORDER — LIDOCAINE HCL (CARDIAC) PF 100 MG/5ML IV SOSY
PREFILLED_SYRINGE | INTRAVENOUS | Status: DC | PRN
  Administered 2020-07-28: 100 mg via INTRAVENOUS

## 2020-07-28 MED ORDER — PROPOFOL 10 MG/ML IV BOLUS
INTRAVENOUS | Status: DC | PRN
  Administered 2020-07-28: 120 mg via INTRAVENOUS

## 2020-07-28 MED ORDER — SUGAMMADEX SODIUM 200 MG/2ML IV SOLN
INTRAVENOUS | Status: DC | PRN
  Administered 2020-07-28: 200 mg via INTRAVENOUS

## 2020-07-28 MED ORDER — CHLORHEXIDINE GLUCONATE 0.12 % MT SOLN: 15.0000 mL | Freq: Once | OROMUCOSAL | Status: DC

## 2020-07-28 MED ORDER — SODIUM CHLORIDE 0.9 % IV SOLN
INTRAVENOUS | Status: DC | PRN
  Administered 2020-07-28: 25 ug/min via INTRAVENOUS

## 2020-07-28 MED ORDER — ONDANSETRON HCL 4 MG/2ML IJ SOLN
INTRAMUSCULAR | Status: DC | PRN
  Administered 2020-07-28: 4 mg via INTRAVENOUS

## 2020-07-28 MED ORDER — ONDANSETRON HCL 4 MG/2ML IJ SOLN: 4.0000 mg | Freq: Four times a day (QID) | INTRAMUSCULAR | Status: DC | PRN

## 2020-07-28 MED ORDER — CEFAZOLIN SODIUM-DEXTROSE 1-4 GM/50ML-% IV SOLN
1.0000 g | INTRAVENOUS | Status: AC
  Administered 2020-07-28: 1 g via INTRAVENOUS

## 2020-07-28 MED ORDER — HYDROMORPHONE HCL 1 MG/ML IJ SOLN: 1.0000 mg | Freq: Once | INTRAMUSCULAR | Status: DC | PRN

## 2020-07-28 SURGICAL SUPPLY — 58 items
ADH SKN CLS APL DERMABOND .7 (GAUZE/BANDAGES/DRESSINGS) ×1
APL PRP STRL LF DISP 70% ISPRP (MISCELLANEOUS) ×1
APPLIER CLIP 11 MED OPEN (CLIP)
APPLIER CLIP 9.375 SM OPEN (CLIP)
APR CLP MED 11 20 MLT OPN (CLIP)
APR CLP SM 9.3 20 MLT OPN (CLIP)
BAG DECANTER FOR FLEXI CONT (MISCELLANEOUS) ×3 IMPLANT
BLADE SURG SZ11 CARB STEEL (BLADE) ×3 IMPLANT
BOOT SUTURE AID YELLOW STND (SUTURE) ×3 IMPLANT
BRUSH SCRUB EZ  4% CHG (MISCELLANEOUS) ×2
BRUSH SCRUB EZ 4% CHG (MISCELLANEOUS) ×1 IMPLANT
CANISTER SUCT 1200ML W/VALVE (MISCELLANEOUS) ×3 IMPLANT
CHLORAPREP W/TINT 26 (MISCELLANEOUS) ×3 IMPLANT
CLIP APPLIE 11 MED OPEN (CLIP) IMPLANT
CLIP APPLIE 9.375 SM OPEN (CLIP) IMPLANT
COVER WAND RF STERILE (DRAPES) ×3 IMPLANT
DERMABOND ADVANCED (GAUZE/BANDAGES/DRESSINGS) ×2
DERMABOND ADVANCED .7 DNX12 (GAUZE/BANDAGES/DRESSINGS) ×1 IMPLANT
DRESSING SURGICEL FIBRLLR 1X2 (HEMOSTASIS) ×1 IMPLANT
DRSG SURGICEL FIBRILLAR 1X2 (HEMOSTASIS) ×3
ELECT CAUTERY BLADE 6.4 (BLADE) ×3 IMPLANT
ELECT REM PT RETURN 9FT ADLT (ELECTROSURGICAL) ×3
ELECTRODE REM PT RTRN 9FT ADLT (ELECTROSURGICAL) ×1 IMPLANT
GEL ULTRASOUND 20GR AQUASONIC (MISCELLANEOUS) IMPLANT
GLOVE BIO SURGEON STRL SZ7 (GLOVE) ×3 IMPLANT
GLOVE INDICATOR 7.5 STRL GRN (GLOVE) ×3 IMPLANT
GLOVE SURG SYN 8.0 (GLOVE) ×3 IMPLANT
GOWN STRL REUS W/ TWL LRG LVL3 (GOWN DISPOSABLE) ×2 IMPLANT
GOWN STRL REUS W/ TWL XL LVL3 (GOWN DISPOSABLE) ×1 IMPLANT
GOWN STRL REUS W/TWL LRG LVL3 (GOWN DISPOSABLE) ×6
GOWN STRL REUS W/TWL XL LVL3 (GOWN DISPOSABLE) ×3
IV NS 500ML (IV SOLUTION) ×3
IV NS 500ML BAXH (IV SOLUTION) ×1 IMPLANT
KIT TURNOVER KIT A (KITS) ×3 IMPLANT
LABEL OR SOLS (LABEL) ×3 IMPLANT
LOOP RED MAXI  1X406MM (MISCELLANEOUS) ×2
LOOP VESSEL MAXI 1X406 RED (MISCELLANEOUS) ×1 IMPLANT
LOOP VESSEL MINI 0.8X406 BLUE (MISCELLANEOUS) ×2 IMPLANT
LOOPS BLUE MINI 0.8X406MM (MISCELLANEOUS) ×4
NEEDLE FILTER BLUNT 18X 1/2SAF (NEEDLE) ×2
NEEDLE FILTER BLUNT 18X1 1/2 (NEEDLE) ×1 IMPLANT
NEEDLE HYPO 30X.5 LL (NEEDLE) IMPLANT
PACK EXTREMITY (MISCELLANEOUS) ×3 IMPLANT
PAD PREP 24X41 OB/GYN DISP (PERSONAL CARE ITEMS) ×3 IMPLANT
STOCKINETTE STRL 4IN 9604848 (GAUZE/BANDAGES/DRESSINGS) ×3 IMPLANT
SUT MNCRL+ 5-0 UNDYED PC-3 (SUTURE) ×1 IMPLANT
SUT MONOCRYL 5-0 (SUTURE) ×2
SUT PROLENE 6 0 BV (SUTURE) ×12 IMPLANT
SUT SILK 2 0 (SUTURE) ×3
SUT SILK 2-0 18XBRD TIE 12 (SUTURE) ×1 IMPLANT
SUT SILK 3 0 (SUTURE) ×3
SUT SILK 3-0 18XBRD TIE 12 (SUTURE) ×1 IMPLANT
SUT SILK 4 0 (SUTURE) ×3
SUT SILK 4-0 18XBRD TIE 12 (SUTURE) ×1 IMPLANT
SUT VIC AB 3-0 SH 27 (SUTURE) ×3
SUT VIC AB 3-0 SH 27X BRD (SUTURE) ×1 IMPLANT
SYR 20ML LL LF (SYRINGE) ×3 IMPLANT
SYR 3ML LL SCALE MARK (SYRINGE) ×3 IMPLANT

## 2020-07-28 NOTE — Interval H&P Note (Signed)
History and Physical Interval Note:  07/28/2020 9:03 AM  Leah Davies  has presented today for surgery, with the diagnosis of ESRD.  The various methods of treatment have been discussed with the patient and family. After consideration of risks, benefits and other options for treatment, the patient has consented to  Procedure(s): ARTERIOVENOUS (AV) FISTULA CREATION (BRACHIAL CEPHALIC ) (Left) as a surgical intervention.  The patient's history has been reviewed, patient examined, no change in status, stable for surgery.  I have reviewed the patient's chart and labs.  Questions were answered to the patient's satisfaction.     Hortencia Pilar

## 2020-07-28 NOTE — Interval H&P Note (Signed)
History and Physical Interval Note:  07/28/2020 9:02 AM  Leah Davies  has presented today for surgery, with the diagnosis of ESRD.  The various methods of treatment have been discussed with the patient and family. After consideration of risks, benefits and other options for treatment, the patient has consented to  Procedure(s): ARTERIOVENOUS (AV) FISTULA CREATION (BRACHIAL CEPHALIC ) (Left) as a surgical intervention.  The patient's history has been reviewed, patient examined, no change in status, stable for surgery.  I have reviewed the patient's chart and labs.  Questions were answered to the patient's satisfaction.     Hortencia Pilar

## 2020-07-28 NOTE — Interval H&P Note (Signed)
History and Physical Interval Note:  07/28/2020 9:01 AM  Leah Davies  has presented today for surgery, with the diagnosis of ESRD.  The various methods of treatment have been discussed with the patient and family. After consideration of risks, benefits and other options for treatment, the patient has consented to  Procedure(s): ARTERIOVENOUS (AV) FISTULA CREATION (BRACHIAL CEPHALIC ) (Left) as a surgical intervention.  The patient's history has been reviewed, patient examined, no change in status, stable for surgery.  I have reviewed the patient's chart and labs.  Questions were answered to the patient's satisfaction.     Hortencia Pilar

## 2020-07-28 NOTE — Op Note (Signed)
     OPERATIVE NOTE   PROCEDURE: left brachial cephalic arteriovenous fistula placement  PRE-OPERATIVE DIAGNOSIS: End Stage Renal Disease  POST-OPERATIVE DIAGNOSIS: End Stage Renal Disease  SURGEON: Hortencia Pilar  ASSISTANT(S): Ms. Hezzie Bump  ANESTHESIA: general  ESTIMATED BLOOD LOSS: <50 cc  FINDING(S): 4 mm cephalic vein  SPECIMEN(S):  none  INDICATIONS:   Leah Davies is a 79 y.o. female who presents with end stage renal disease.  The patient is scheduled for left brachiocephalic arteriovenous fistula placement.  The patient is aware the risks include but are not limited to: bleeding, infection, steal syndrome, nerve damage, ischemic monomelic neuropathy, failure to mature, and need for additional procedures.  The patient is aware of the risks of the procedure and elects to proceed forward.  DESCRIPTION: After full informed written consent was obtained from the patient, the patient was brought back to the operating room and placed supine upon the operating table.  Prior to induction, the patient received IV antibiotics.   After obtaining adequate anesthesia, the patient was then prepped and draped in the standard fashion for a left arm access procedure.   A first assistant was required to provide a safe and appropriate environment for executing the surgery.  The assistant was integral in providing retraction, exposure, running suture providing suction and in the closing process.   A curvilinear incision was then created midway between the radial impulse and the cephalic vein. The cephalic vein was then identified and dissected circumferentially. It was marked with a surgical marker.    Attention was then turned to the brachial artery which was exposed through the same incision and looped proximally and distally. Side branches were controlled with 4-0 silk ties.  The distal segment of the vein was ligated with a  2-0 silk, and the vein was transected.  The proximal  segment was interrogated with serial dilators.  The vein accepted up to a 4 mm dilator without any difficulty. Heparinized saline was infused into the vein and clamped it with a small bulldog.  At this point, I reset my exposure of the brachial artery and controlled the artery with vessel loops proximally and distally.  An arteriotomy was then made with a #11 blade, and extended with a Potts scissor.  Heparinized saline was injected proximal and distal into the radial artery.  The vein was then approximated to the artery while the artery was in its native bed and subsequently the vein was beveled using Potts scissors. The vein was then sewn to the artery in an end-to-side configuration with a running stitch of 6-0 Prolene.  Prior to completing this anastomosis Flushing maneuvers were performed and the artery was allowed to forward and back bleed.  There was no evidence of clot from any vessels.  I completed the anastomosis in the usual fashion and then released all vessel loops and clamps.    There was good  thrill in the venous outflow, and there was 1+ palpable radial pulse.  At this point, I irrigated out the surgical wound.  There was no further active bleeding.  The subcutaneous tissue was reapproximated with a running stitch of 3-0 Vicryl.  The skin was then reapproximated with a running subcuticular stitch of 4-0 Vicryl.  The skin was then cleaned, dried, and reinforced with Dermabond.    The patient tolerated this procedure well.   COMPLICATIONS: None  CONDITION: [facility]  Office: [phone]   07/28/2020, 10:45 AM

## 2020-07-28 NOTE — Anesthesia Procedure Notes (Signed)
Procedure Name: Intubation Performed by: Fletcher[place]Harrison, [place], CRNA Pre-anesthesia Checklist: Patient identified, Emergency Drugs available, Suction available and Patient being monitored Patient Re-evaluated:Patient Re-evaluated prior to induction Oxygen Delivery Method: Circle system utilized Preoxygenation: Pre-oxygenation with 100% oxygen Induction Type: IV induction Ventilation: Mask ventilation without difficulty Laryngoscope Size: McGraph and 3 Grade View: Grade I Tube type: Oral Tube size: 6.5 mm Number of attempts: 1 Airway Equipment and Method: Stylet and Oral airway Placement Confirmation: ETT inserted through vocal cords under direct vision,  positive ETCO2,  breath sounds checked- equal and bilateral and CO2 detector Secured at: 21 cm Tube secured with: Tape Dental Injury: Teeth and Oropharynx as per pre-operative assessment        

## 2020-07-28 NOTE — Transfer of Care (Signed)
Immediate Anesthesia Transfer of Care Note  Patient: AMARYAH MALLEN  Procedure(s) Performed: ARTERIOVENOUS (AV) FISTULA CREATION (BRACHIAL CEPHALIC ) (Left )  Patient Location: PACU  Anesthesia Type:General  Level of Consciousness: awake, drowsy and patient cooperative  Airway & Oxygen Therapy: Patient Spontanous Breathing and Patient connected to face mask oxygen  Post-op Assessment: Report given to RN and Post -op Vital signs reviewed and stable  Post vital signs: Reviewed and stable  Last Vitals:  Vitals Value Taken Time  BP 157/65 07/28/20 1056  Temp    Pulse 97 07/28/20 1105  Resp 11 07/28/20 1105  SpO2 92 % 07/28/20 1105  Vitals shown include unvalidated device data.  Last Pain:  Vitals:   07/28/20 1045  TempSrc:   PainSc: 0-No pain         Complications: No complications documented.

## 2020-07-28 NOTE — Anesthesia Preprocedure Evaluation (Addendum)
Anesthesia Evaluation  Patient identified by MRN, date of birth, ID band Patient awake    Reviewed: Allergy & Precautions, H&P , NPO status , Patient's Chart, lab work & pertinent test results  History of Anesthesia Complications (+) history of anesthetic complications ("trouble waking up")  Airway Mallampati: II  TM Distance: >3 FB Neck ROM: full    Dental  (+) Teeth Intact   Pulmonary neg pulmonary ROS, neg sleep apnea, neg COPD,    breath sounds clear to auscultation       Cardiovascular hypertension, (-) angina(-) Past MI and (-) Cardiac Stents (-) dysrhythmias  Rhythm:regular Rate:Normal     Neuro/Psych  Headaches, negative psych ROS   GI/Hepatic Neg liver ROS, GERD  Controlled,  Endo/Other  negative endocrine ROS  Renal/GU ESRF and DialysisRenal diseaseLast dialyzed two days ago     Musculoskeletal   Abdominal   Peds  Hematology negative hematology ROS (+)   Anesthesia Other Findings Past Medical History: No date: Chronic kidney disease     Comment:  renal insufficiency No date: Complication of anesthesia     Comment:  Vital signs low during colonoscopy had trouble to wake               up  No date: Elevated lipids No date: GERD (gastroesophageal reflux disease) No date: Gout No date: Hypertension  Past Surgical History: 2010: BREAST EXCISIONAL BIOPSY; Left     Comment:  benign No date: BREAST SURGERY     Comment:  Lt breast biopsy 04/10/2020: DIALYSIS/PERMA CATHETER INSERTION; N/A     Comment:  Procedure: DIALYSIS/PERMA CATHETER INSERTION;  Surgeon:               Algernon Huxley, MD;  Location: [place] [facility];                Service: Cardiovascular;  Laterality: N/A; 10/05/2015: KNEE ARTHROSCOPY; Left     Comment:  Procedure: ARTHROSCOPY KNEE partial medial and lateral               meniscectomy, lateral release for sublux patella;                Surgeon: Hessie Knows, MD;  Location: [facility];   Service:              Orthopedics;  Laterality: Left;  BMI    Body Mass Index: 32.32 kg/m      Reproductive/Obstetrics negative OB ROS                           Anesthesia Physical Anesthesia Plan  ASA: III  Anesthesia Plan: General ETT   Post-op Pain Management:    Induction:   PONV Risk Score and Plan: Dexamethasone, Ondansetron and Treatment may vary due to age or medical condition  Airway Management Planned:   Additional Equipment:   Intra-op Plan:   Post-operative Plan:   Informed Consent: I have reviewed the patients History and Physical, chart, labs and discussed the procedure including the risks, benefits and alternatives for the proposed anesthesia with the patient or authorized representative who has indicated his/her understanding and acceptance.     Dental Advisory Given  Plan Discussed with: Anesthesiologist, CRNA and Surgeon  Anesthesia Plan Comments:       Anesthesia Quick Evaluation

## 2020-07-28 NOTE — Discharge Instructions (Signed)

## 2020-07-30 ENCOUNTER — Encounter: Payer: Self-pay | Admitting: Vascular Surgery

## 2020-08-01 NOTE — Anesthesia Postprocedure Evaluation (Signed)
Anesthesia Post Note  Patient: Leah Davies  Procedure(s) Performed: ARTERIOVENOUS (AV) FISTULA CREATION (BRACHIAL CEPHALIC ) (Left )  Patient location during evaluation: PACU Anesthesia Type: General Level of consciousness: awake and alert Pain management: pain level controlled Vital Signs Assessment: post-procedure vital signs reviewed and stable Respiratory status: spontaneous breathing, nonlabored ventilation and respiratory function stable Cardiovascular status: blood pressure returned to baseline and stable Postop Assessment: no apparent nausea or vomiting Anesthetic complications: no   No complications documented.   Last Vitals:  Vitals:   07/28/20 1203 07/28/20 1304  BP: (!) 135/45 (!) 149/56  Pulse: 84 78  Resp: 16   Temp: (!) 36.1 C   SpO2: 94% 100%    Last Pain:  Vitals:   07/31/20 0818  TempSrc:   PainSc: 0-No pain                 Brett Canales Dondi Burandt

## 2020-08-15 ENCOUNTER — Other Ambulatory Visit (INDEPENDENT_AMBULATORY_CARE_PROVIDER_SITE_OTHER): Payer: Self-pay | Admitting: Vascular Surgery

## 2020-08-15 DIAGNOSIS — N186 End stage renal disease: Secondary | ICD-10-CM

## 2020-08-15 DIAGNOSIS — Z9889 Other specified postprocedural states: Secondary | ICD-10-CM

## 2020-08-17 ENCOUNTER — Other Ambulatory Visit: Payer: Self-pay

## 2020-08-17 ENCOUNTER — Encounter (INDEPENDENT_AMBULATORY_CARE_PROVIDER_SITE_OTHER): Payer: Self-pay | Admitting: Nurse Practitioner

## 2020-08-17 ENCOUNTER — Ambulatory Visit (INDEPENDENT_AMBULATORY_CARE_PROVIDER_SITE_OTHER): Payer: Medicare HMO | Admitting: Nurse Practitioner

## 2020-08-17 ENCOUNTER — Ambulatory Visit (INDEPENDENT_AMBULATORY_CARE_PROVIDER_SITE_OTHER): Payer: Medicare HMO

## 2020-08-17 VITALS — BP 146/63 | HR 80 | Resp 16 | Wt 145.4 lb

## 2020-08-17 DIAGNOSIS — I10 Essential (primary) hypertension: Secondary | ICD-10-CM

## 2020-08-17 DIAGNOSIS — K219 Gastro-esophageal reflux disease without esophagitis: Secondary | ICD-10-CM | POA: Diagnosis not present

## 2020-08-17 DIAGNOSIS — Z9889 Other specified postprocedural states: Secondary | ICD-10-CM

## 2020-08-17 DIAGNOSIS — N186 End stage renal disease: Secondary | ICD-10-CM

## 2020-08-18 ENCOUNTER — Telehealth (INDEPENDENT_AMBULATORY_CARE_PROVIDER_SITE_OTHER): Payer: Self-pay

## 2020-08-18 ENCOUNTER — Other Ambulatory Visit
Admission: RE | Admit: 2020-08-18 | Discharge: 2020-08-18 | Disposition: A | Discharge status: Home / Self Care | Payer: Medicare HMO | Source: Ambulatory Visit | Attending: Vascular Surgery | Admitting: Vascular Surgery

## 2020-08-18 DIAGNOSIS — Z01812 Encounter for preprocedural laboratory examination: Secondary | ICD-10-CM | POA: Diagnosis present

## 2020-08-18 DIAGNOSIS — Z20822 Contact with and (suspected) exposure to covid-19: Secondary | ICD-10-CM | POA: Insufficient documentation

## 2020-08-18 LAB — SARS CORONAVIRUS 2 (TAT 6-24 HRS): SARS Coronavirus 2: NEGATIVE

## 2020-08-18 NOTE — Telephone Encounter (Signed)
Spoke with [name] regarding the patient's procedure and per [name] the information will be given to the patient and her grandson. Patient scheduled with Dr. Delana Meyer for a left arm fistulagram on 08/22/20 with a 11:30 am arrival time to the [facility]. Covid testing on 08/18/20 between 8-1 pm att the [facility]. Pre-procedure instructions were faxed to [facility] at a [employer].

## 2020-08-21 ENCOUNTER — Encounter (INDEPENDENT_AMBULATORY_CARE_PROVIDER_SITE_OTHER): Payer: Self-pay | Admitting: Nurse Practitioner

## 2020-08-21 NOTE — Progress Notes (Signed)
Subjective:    Patient ID: Leah Davies, female    DOB: [birth year], 79 y.o.   MRN: [id number] Chief Complaint  Patient presents with  . Follow-up    [facility] 3wk post av fistula    The patient returns to the office for followup of their dialysis access.  The patient had her access created 07/28/2020.  This is the first look of the patient's new AV fistula.  The patient notes that sometimes her hands feel cool and tingly but this is intermittent at times.  It is not consistent.  The patient does note that while she is on dialysis sometimes she does have a large decrease in pressures.  The patient denies redness or swelling at the access site. The patient denies fever or chills at home or while on dialysis.  The patient denies amaurosis fugax or recent TIA symptoms. There are no recent neurological changes noted. The patient denies claudication symptoms or rest pain symptoms. The patient denies history of DVT, PE or superficial thrombophlebitis. The patient denies recent episodes of angina or shortness of breath.   Today the patient has a flow volume of 697.   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      Review of Systems  Hematological: Bruises/bleeds easily.  All other systems reviewed and are negative.      Objective:   Physical Exam Vitals reviewed.  HENT:     Head: Normocephalic.  Cardiovascular:     Rate and Rhythm: Normal rate.     Pulses: Normal pulses.     Arteriovenous access: left arteriovenous access is present.    Comments: Good thrill with weak bruit Pulmonary:     Effort: Pulmonary effort is normal.  Neurological:     Mental Status: She is alert and oriented to person, place, and time.  Psychiatric:        Mood and Affect: Mood normal.        Behavior: Behavior normal.        Thought Content: Thought content normal.        Judgment: Judgment normal.     BP  (!) 146/63 (BP Location: Right Arm)   Pulse 80   Resp 16   Wt 145 lb 6.4 oz (66 kg)   BMI 29.37 kg/m   Past Medical History:  Diagnosis Date  . Chronic kidney disease    renal insufficiency  . Complication of anesthesia    Vital signs low during colonoscopy had trouble to wake up   . Elevated lipids   . GERD (gastroesophageal reflux disease)   . Gout   . Hypertension     Social History   Socioeconomic History  . Marital status: Single    Spouse name: Not on file  . Number of children: Not on file  . Years of education: Not on file  . Highest education level: Not on file  Occupational History  . Not on file  Tobacco Use  . Smoking status: Never Smoker  . Smokeless tobacco: Never Used  Vaping Use  . Vaping Use: Never used  Substance and Sexual Activity  . Alcohol use: Not Currently    Comment: rare  . Drug use: No  . Sexual activity: Not on file  Other Topics Concern  . Not on file  Social History Narrative  . Not on file   Social Determinants of Health  Financial Resource Strain:   . Difficulty of Paying Living Expenses: Not on file  Food Insecurity:   . Worried About Charity fundraiser in the Last Year: Not on file  . Ran Out of Food in the Last Year: Not on file  Transportation Needs:   . Lack of Transportation (Medical): Not on file  . Lack of Transportation (Non-Medical): Not on file  Physical Activity:   . Days of Exercise per Week: Not on file  . Minutes of Exercise per Session: Not on file  Stress:   . Feeling of Stress : Not on file  Social Connections:   . Frequency of Communication with Friends and Family: Not on file  . Frequency of Social Gatherings with Friends and Family: Not on file  . Attends Religious Services: Not on file  . Active Member of Clubs or Organizations: Not on file  . Attends Archivist Meetings: Not on file  . Marital Status: Not on file  Intimate Partner Violence:   . Fear of Current or Ex-Partner: Not on  file  . Emotionally Abused: Not on file  . Physically Abused: Not on file  . Sexually Abused: Not on file    Past Surgical History:  Procedure Laterality Date  . AV FISTULA PLACEMENT Left 07/28/2020   Procedure: ARTERIOVENOUS (AV) FISTULA CREATION (BRACHIAL CEPHALIC );  Surgeon: Katha Cabal, MD;  Location: [facility];  Service: Vascular;  Laterality: Left;  . BREAST EXCISIONAL BIOPSY Left 2010   benign  . BREAST SURGERY     Lt breast biopsy  . DIALYSIS/PERMA CATHETER INSERTION N/A 04/10/2020   Procedure: DIALYSIS/PERMA CATHETER INSERTION;  Surgeon: Algernon Huxley, MD;  Location: [place] [facility];  Service: Cardiovascular;  Laterality: N/A;  . KNEE ARTHROSCOPY Left 10/05/2015   Procedure: ARTHROSCOPY KNEE partial medial and lateral meniscectomy, lateral release for sublux patella;  Surgeon: Hessie Knows, MD;  Location: [facility];  Service: Orthopedics;  Laterality: Left;    Family History  Problem Relation Age of Onset  . Breast cancer Sister 51    Allergies  Allergen Reactions  . Amlodipine     Unknown  . Azithromycin     Unknown    CBC Latest Ref Rng & Units 07/28/2020 07/25/2020 06/26/2020  WBC 4.0 - 10.5 K/uL - 7.5 7.4  Hemoglobin 12.0 - 15.0 g/dL 11.9(L) 12.0 12.0  Hematocrit 36 - 46 % 35.0(L) 35.7(L) 37.4  Platelets 150 - 400 K/uL - 162 218      CMP     Component Value Date/Time   NA 133 (L) 07/28/2020 0906   K 4.4 07/28/2020 0906   CL 110 07/28/2020 0906   CO2 25 07/25/2020 0951   GLUCOSE 85 07/28/2020 0906   BUN 48 (H) 07/28/2020 0906   CREATININE 5.50 (H) 07/28/2020 0906   CALCIUM 8.6 (L) 07/25/2020 0951   PROT 7.3 06/26/2020 1925   ALBUMIN 3.9 06/26/2020 1925   AST 14 (L) 06/26/2020 1925   ALT 11 06/26/2020 1925   ALKPHOS 103 06/26/2020 1925   BILITOT 0.6 06/26/2020 1925   GFRNONAA 11 (L) 07/25/2020 0951   GFRAA 8 (L) 06/26/2020 1925     No results found.     Assessment & Plan:   1. ESRD (end stage renal disease) ([place]) Recommend:  The  patient is experiencing increasing problems with their dialysis access.  Patient should have a fistulagram with the intention for intervention.  The intention for intervention is to restore appropriate flow and prevent  thrombosis and possible loss of the access.  As well as improve the quality of dialysis therapy.  The risks, benefits and alternative therapies were reviewed in detail with the patient.  All questions were answered.  The patient agrees to proceed with angio/intervention.      2. Essential hypertension Continue antihypertensive medications as already ordered, these medications have been reviewed and there are no changes at this time.   3. Gastroesophageal reflux disease, unspecified whether esophagitis present Continue PPI as already ordered, this medication has been reviewed and there are no changes at this time.  Avoidence of caffeine and alcohol  Moderate elevation of the head of the bed      Current Outpatient Medications on File Prior to Visit  Medication Sig Dispense Refill  . acetaminophen (TYLENOL) 500 MG tablet Take 1,000 mg by mouth every 6 (six) hours as needed for moderate pain.     Marland Kitchen alum & mag hydroxide-simeth (MAALOX/MYLANTA) 200-200-20 MG/5ML suspension Take 5 mLs by mouth every 6 (six) hours as needed for indigestion or heartburn.     . gabapentin (NEURONTIN) 100 MG capsule Take 100 mg by mouth every Monday, Wednesday, and Friday with hemodialysis.    . hydrALAZINE (APRESOLINE) 25 MG tablet Take 1 tablet (25 mg total) by mouth every 8 (eight) hours. (Patient taking differently: Take 25 mg by mouth 3 (three) times daily. ) 90 tablet 0  . HYDROcodone-acetaminophen (NORCO) 5-325 MG tablet Take 1-2 tablets by mouth every 6 (six) hours as needed for moderate pain or severe pain. 30 tablet 0  . isosorbide mononitrate (IMDUR) 30 MG 24 hr tablet Take 1 tablet (30 mg total) by mouth daily. 30 tablet 1  . meclizine (ANTIVERT) 25 MG tablet Take 25 mg by mouth 3  (three) times daily as needed for dizziness.    . metoprolol succinate (TOPROL-XL) 25 MG 24 hr tablet Take 25 mg by mouth daily.    Marland Kitchen losartan (COZAAR) 50 MG tablet Take 1 tablet (50 mg total) by mouth daily. 30 tablet 0   No current facility-administered medications on file prior to visit.    There are no Patient Instructions on file for this visit. No follow-ups on file.   Kris Hartmann, NP

## 2020-08-21 NOTE — H&P (View-Only) (Signed)
Subjective:    Patient ID: Leah Davies, female    DOB: October 27, 1940, 79 y.o.   MRN: [id number] Chief Complaint  Patient presents with  . Follow-up    [facility] 3wk post av fistula    The patient returns to the office for followup of their dialysis access.  The patient had her access created 07/28/2020.  This is the first look of the patient's new AV fistula.  The patient notes that sometimes her hands feel cool and tingly but this is intermittent at times.  It is not consistent.  The patient does note that while she is on dialysis sometimes she does have a large decrease in pressures.  The patient denies redness or swelling at the access site. The patient denies fever or chills at home or while on dialysis.  The patient denies amaurosis fugax or recent TIA symptoms. There are no recent neurological changes noted. The patient denies claudication symptoms or rest pain symptoms. The patient denies history of DVT, PE or superficial thrombophlebitis. The patient denies recent episodes of angina or shortness of breath.   Today the patient has a flow volume of 697.   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      Review of Systems  Hematological: Bruises/bleeds easily.  All other systems reviewed and are negative.      Objective:   Physical Exam Vitals reviewed.  HENT:     Head: Normocephalic.  Cardiovascular:     Rate and Rhythm: Normal rate.     Pulses: Normal pulses.     Arteriovenous access: left arteriovenous access is present.    Comments: Good thrill with weak bruit Pulmonary:     Effort: Pulmonary effort is normal.  Neurological:     Mental Status: She is alert and oriented to person, place, and time.  Psychiatric:        Mood and Affect: Mood normal.        Behavior: Behavior normal.        Thought Content: Thought content normal.        Judgment: Judgment normal.     BP  (!) 146/63 (BP Location: Right Arm)   Pulse 80   Resp 16   Wt 145 lb 6.4 oz (66 kg)   BMI 29.37 kg/m   Past Medical History:  Diagnosis Date  . Chronic kidney disease    renal insufficiency  . Complication of anesthesia    Vital signs low during colonoscopy had trouble to wake up   . Elevated lipids   . GERD (gastroesophageal reflux disease)   . Gout   . Hypertension     Social History   Socioeconomic History  . Marital status: Single    Spouse name: Not on file  . Number of children: Not on file  . Years of education: Not on file  . Highest education level: Not on file  Occupational History  . Not on file  Tobacco Use  . Smoking status: Never Smoker  . Smokeless tobacco: Never Used  Vaping Use  . Vaping Use: Never used  Substance and Sexual Activity  . Alcohol use: Not Currently    Comment: rare  . Drug use: No  . Sexual activity: Not on file  Other Topics Concern  . Not on file  Social History Narrative  . Not on file   Social Determinants of Health  Financial Resource Strain:   . Difficulty of Paying Living Expenses: Not on file  Food Insecurity:   . Worried About Charity fundraiser in the Last Year: Not on file  . Ran Out of Food in the Last Year: Not on file  Transportation Needs:   . Lack of Transportation (Medical): Not on file  . Lack of Transportation (Non-Medical): Not on file  Physical Activity:   . Days of Exercise per Week: Not on file  . Minutes of Exercise per Session: Not on file  Stress:   . Feeling of Stress : Not on file  Social Connections:   . Frequency of Communication with Friends and Family: Not on file  . Frequency of Social Gatherings with Friends and Family: Not on file  . Attends Religious Services: Not on file  . Active Member of Clubs or Organizations: Not on file  . Attends Archivist Meetings: Not on file  . Marital Status: Not on file  Intimate Partner Violence:   . Fear of Current or Ex-Partner: Not on  file  . Emotionally Abused: Not on file  . Physically Abused: Not on file  . Sexually Abused: Not on file    Past Surgical History:  Procedure Laterality Date  . AV FISTULA PLACEMENT Left 07/28/2020   Procedure: ARTERIOVENOUS (AV) FISTULA CREATION (BRACHIAL CEPHALIC );  Surgeon: Katha Cabal, MD;  Location: [facility];  Service: Vascular;  Laterality: Left;  . BREAST EXCISIONAL BIOPSY Left 2010   benign  . BREAST SURGERY     Lt breast biopsy  . DIALYSIS/PERMA CATHETER INSERTION N/A 04/10/2020   Procedure: DIALYSIS/PERMA CATHETER INSERTION;  Surgeon: Algernon Huxley, MD;  Location: [facility];  Service: Cardiovascular;  Laterality: N/A;  . KNEE ARTHROSCOPY Left 10/05/2015   Procedure: ARTHROSCOPY KNEE partial medial and lateral meniscectomy, lateral release for sublux patella;  Surgeon: Hessie Knows, MD;  Location: [facility];  Service: Orthopedics;  Laterality: Left;    Family History  Problem Relation Age of Onset  . Breast cancer Sister 13    Allergies  Allergen Reactions  . Amlodipine     Unknown  . Azithromycin     Unknown    CBC Latest Ref Rng & Units 07/28/2020 07/25/2020 06/26/2020  WBC 4.0 - 10.5 K/uL - 7.5 7.4  Hemoglobin 12.0 - 15.0 g/dL 11.9(L) 12.0 12.0  Hematocrit 36 - 46 % 35.0(L) 35.7(L) 37.4  Platelets 150 - 400 K/uL - 162 218      CMP     Component Value Date/Time   NA 133 (L) 07/28/2020 0906   K 4.4 07/28/2020 0906   CL 110 07/28/2020 0906   CO2 25 07/25/2020 0951   GLUCOSE 85 07/28/2020 0906   BUN 48 (H) 07/28/2020 0906   CREATININE 5.50 (H) 07/28/2020 0906   CALCIUM 8.6 (L) 07/25/2020 0951   PROT 7.3 06/26/2020 1925   ALBUMIN 3.9 06/26/2020 1925   AST 14 (L) 06/26/2020 1925   ALT 11 06/26/2020 1925   ALKPHOS 103 06/26/2020 1925   BILITOT 0.6 06/26/2020 1925   GFRNONAA 11 (L) 07/25/2020 0951   GFRAA 8 (L) 06/26/2020 1925     No results found.     Assessment & Plan:   1. ESRD (end stage renal disease) ([place]) Recommend:  The  patient is experiencing increasing problems with their dialysis access.  Patient should have a fistulagram with the intention for intervention.  The intention for intervention is to restore appropriate flow and prevent  thrombosis and possible loss of the access.  As well as improve the quality of dialysis therapy.  The risks, benefits and alternative therapies were reviewed in detail with the patient.  All questions were answered.  The patient agrees to proceed with angio/intervention.      2. Essential hypertension Continue antihypertensive medications as already ordered, these medications have been reviewed and there are no changes at this time.   3. Gastroesophageal reflux disease, unspecified whether esophagitis present Continue PPI as already ordered, this medication has been reviewed and there are no changes at this time.  Avoidence of caffeine and alcohol  Moderate elevation of the head of the bed      Current Outpatient Medications on File Prior to Visit  Medication Sig Dispense Refill  . acetaminophen (TYLENOL) 500 MG tablet Take 1,000 mg by mouth every 6 (six) hours as needed for moderate pain.     Marland Kitchen alum & mag hydroxide-simeth (MAALOX/MYLANTA) 200-200-20 MG/5ML suspension Take 5 mLs by mouth every 6 (six) hours as needed for indigestion or heartburn.     . gabapentin (NEURONTIN) 100 MG capsule Take 100 mg by mouth every Monday, Wednesday, and Friday with hemodialysis.    . hydrALAZINE (APRESOLINE) 25 MG tablet Take 1 tablet (25 mg total) by mouth every 8 (eight) hours. (Patient taking differently: Take 25 mg by mouth 3 (three) times daily. ) 90 tablet 0  . HYDROcodone-acetaminophen (NORCO) 5-325 MG tablet Take 1-2 tablets by mouth every 6 (six) hours as needed for moderate pain or severe pain. 30 tablet 0  . isosorbide mononitrate (IMDUR) 30 MG 24 hr tablet Take 1 tablet (30 mg total) by mouth daily. 30 tablet 1  . meclizine (ANTIVERT) 25 MG tablet Take 25 mg by mouth 3  (three) times daily as needed for dizziness.    . metoprolol succinate (TOPROL-XL) 25 MG 24 hr tablet Take 25 mg by mouth daily.    Marland Kitchen losartan (COZAAR) 50 MG tablet Take 1 tablet (50 mg total) by mouth daily. 30 tablet 0   No current facility-administered medications on file prior to visit.    There are no Patient Instructions on file for this visit. No follow-ups on file.   Kris Hartmann, NP

## 2020-08-22 ENCOUNTER — Other Ambulatory Visit: Payer: Self-pay

## 2020-08-22 ENCOUNTER — Encounter: Payer: Self-pay | Admitting: Vascular Surgery

## 2020-08-22 ENCOUNTER — Other Ambulatory Visit (INDEPENDENT_AMBULATORY_CARE_PROVIDER_SITE_OTHER): Payer: Self-pay | Admitting: Nurse Practitioner

## 2020-08-22 ENCOUNTER — Ambulatory Visit
Admission: RE | Admit: 2020-08-22 | Discharge: 2020-08-22 | Disposition: A | Discharge status: Home / Self Care | Payer: Medicare HMO | Attending: Vascular Surgery | Admitting: Vascular Surgery

## 2020-08-22 ENCOUNTER — Encounter
Admission: RE | Disposition: A | Discharge status: Home / Self Care | Payer: Self-pay | Source: Home / Self Care | Attending: Vascular Surgery

## 2020-08-22 DIAGNOSIS — T82898A Other specified complication of vascular prosthetic devices, implants and grafts, initial encounter: Secondary | ICD-10-CM | POA: Diagnosis not present

## 2020-08-22 DIAGNOSIS — Z992 Dependence on renal dialysis: Secondary | ICD-10-CM | POA: Insufficient documentation

## 2020-08-22 DIAGNOSIS — Z888 Allergy status to other drugs, medicaments and biological substances status: Secondary | ICD-10-CM | POA: Insufficient documentation

## 2020-08-22 DIAGNOSIS — Z79899 Other long term (current) drug therapy: Secondary | ICD-10-CM | POA: Diagnosis not present

## 2020-08-22 DIAGNOSIS — Z881 Allergy status to other antibiotic agents status: Secondary | ICD-10-CM | POA: Diagnosis not present

## 2020-08-22 DIAGNOSIS — N186 End stage renal disease: Secondary | ICD-10-CM

## 2020-08-22 DIAGNOSIS — Y841 Kidney dialysis as the cause of abnormal reaction of the patient, or of later complication, without mention of misadventure at the time of the procedure: Secondary | ICD-10-CM | POA: Insufficient documentation

## 2020-08-22 DIAGNOSIS — I120 Hypertensive chronic kidney disease with stage 5 chronic kidney disease or end stage renal disease: Secondary | ICD-10-CM | POA: Diagnosis not present

## 2020-08-22 DIAGNOSIS — K219 Gastro-esophageal reflux disease without esophagitis: Secondary | ICD-10-CM | POA: Diagnosis not present

## 2020-08-22 DIAGNOSIS — T82858A Stenosis of vascular prosthetic devices, implants and grafts, initial encounter: Secondary | ICD-10-CM | POA: Insufficient documentation

## 2020-08-22 HISTORY — PX: A/V FISTULAGRAM: CATH118298

## 2020-08-22 SURGERY — A/V FISTULAGRAM
Anesthesia: Moderate Sedation | Laterality: Left

## 2020-08-22 MED ORDER — MIDAZOLAM HCL 5 MG/5ML IJ SOLN
INTRAMUSCULAR | Status: AC
  Filled 2020-08-22: qty 5

## 2020-08-22 MED ORDER — CEFAZOLIN SODIUM-DEXTROSE 1-4 GM/50ML-% IV SOLN
INTRAVENOUS | Status: AC
  Administered 2020-08-22: 1 g via INTRAVENOUS
  Filled 2020-08-22: qty 50

## 2020-08-22 MED ORDER — DIPHENHYDRAMINE HCL 50 MG/ML IJ SOLN: 50.0000 mg | Freq: Once | INTRAMUSCULAR | Status: DC | PRN

## 2020-08-22 MED ORDER — IODIXANOL 320 MG/ML IV SOLN
INTRAVENOUS | Status: DC | PRN
  Administered 2020-08-22: 60 mL via INTRAVENOUS

## 2020-08-22 MED ORDER — FAMOTIDINE 20 MG PO TABS: 40.0000 mg | ORAL_TABLET | Freq: Once | ORAL | Status: DC | PRN

## 2020-08-22 MED ORDER — MIDAZOLAM HCL 2 MG/2ML IJ SOLN
INTRAMUSCULAR | Status: DC | PRN
  Administered 2020-08-22 (×2): 1 mg via INTRAVENOUS
  Administered 2020-08-22: 2 mg via INTRAVENOUS

## 2020-08-22 MED ORDER — HYDROMORPHONE HCL 1 MG/ML IJ SOLN: 1.0000 mg | Freq: Once | INTRAMUSCULAR | Status: DC | PRN

## 2020-08-22 MED ORDER — FENTANYL CITRATE (PF) 100 MCG/2ML IJ SOLN
INTRAMUSCULAR | Status: AC
  Filled 2020-08-22: qty 2

## 2020-08-22 MED ORDER — ONDANSETRON HCL 4 MG/2ML IJ SOLN: 4.0000 mg | Freq: Four times a day (QID) | INTRAMUSCULAR | Status: DC | PRN

## 2020-08-22 MED ORDER — METHYLPREDNISOLONE SODIUM SUCC 125 MG IJ SOLR: 125.0000 mg | Freq: Once | INTRAMUSCULAR | Status: DC | PRN

## 2020-08-22 MED ORDER — NITROGLYCERIN 1 MG/10 ML FOR IR/CATH LAB
INTRA_ARTERIAL | Status: DC | PRN
  Administered 2020-08-22: 400 ug
  Administered 2020-08-22: 200 ug

## 2020-08-22 MED ORDER — HEPARIN SODIUM (PORCINE) 1000 UNIT/ML IJ SOLN
INTRAMUSCULAR | Status: DC | PRN
  Administered 2020-08-22: 3000 [IU] via INTRAVENOUS
  Administered 2020-08-22: 2000 [IU] via INTRAVENOUS

## 2020-08-22 MED ORDER — SODIUM CHLORIDE 0.9 % IV SOLN: INTRAVENOUS | Status: DC

## 2020-08-22 MED ORDER — MIDAZOLAM HCL 2 MG/ML PO SYRP: 8.0000 mg | ORAL_SOLUTION | Freq: Once | ORAL | Status: DC | PRN

## 2020-08-22 MED ORDER — CEFAZOLIN SODIUM-DEXTROSE 1-4 GM/50ML-% IV SOLN: 1.0000 g | Freq: Once | INTRAVENOUS | Status: AC

## 2020-08-22 MED ORDER — FENTANYL CITRATE (PF) 100 MCG/2ML IJ SOLN
INTRAMUSCULAR | Status: DC | PRN
  Administered 2020-08-22 (×2): 25 ug via INTRAVENOUS
  Administered 2020-08-22: 50 ug via INTRAVENOUS

## 2020-08-22 MED ORDER — HEPARIN SODIUM (PORCINE) 1000 UNIT/ML IJ SOLN
INTRAMUSCULAR | Status: AC
  Filled 2020-08-22: qty 1

## 2020-08-22 SURGICAL SUPPLY — 27 items
BALLN LUTONIX 018 4X40X130 (BALLOONS) ×4
BALLN ULTRVRSE 2.5X40X150 (BALLOONS) ×2
BALLN ULTRVRSE 2X150X150 (BALLOONS) ×1
BALLN ULTRVRSE 2X150X150 OTW (BALLOONS) ×1
BALLN ULTRVRSE 4X40X150 (BALLOONS) ×1
BALLN ULTRVRSE 4X40X150 OTW (BALLOONS) ×1
BALLOON LUTONIX 018 4X40X130 (BALLOONS) ×2 IMPLANT
BALLOON ULTRVRSE 150X40X2.5 (BALLOONS) ×1 IMPLANT
BALLOON ULTRVRSE 2X150X150 OTW (BALLOONS) ×1 IMPLANT
BALLOON ULTRVRSE 4X40X150 OTW (BALLOONS) ×1 IMPLANT
COVER DRAPE FLUORO 36X44 (DRAPES) ×2 IMPLANT
COVER PROBE U/S 5X48 (MISCELLANEOUS) ×2 IMPLANT
DEVICE TORQUE .025-.038 (MISCELLANEOUS) ×2 IMPLANT
DRAPE BRACHIAL (DRAPES) ×2 IMPLANT
GLIDECATH 4FR STR (CATHETERS) ×2 IMPLANT
GLIDECATH ANGLED 4FR 120CM (CATHETERS) ×2 IMPLANT
GUIDEWIRE ADV .018X180CM (WIRE) ×2 IMPLANT
GUIDEWIRE ANGLED .035 180CM (WIRE) ×2 IMPLANT
KIT ENCORE 26 ADVANTAGE (KITS) ×2 IMPLANT
NEEDLE ENTRY 21GA 7CM ECHOTIP (NEEDLE) ×2 IMPLANT
PACK ANGIOGRAPHY (CUSTOM PROCEDURE TRAY) ×2 IMPLANT
SET INTRO CAPELLA COAXIAL (SET/KITS/TRAYS/PACK) ×2 IMPLANT
SHEATH BRITE TIP 4FRX11 (SHEATH) ×2 IMPLANT
SHEATH BRITE TIP 6FRX5.5 (SHEATH) ×2 IMPLANT
SUT MNCRL AB 4-0 PS2 18 (SUTURE) ×2 IMPLANT
VALVE CHECKFLO PERFORMER (SHEATH) ×2 IMPLANT
WIRE G V18X300CM (WIRE) ×4 IMPLANT

## 2020-08-22 NOTE — Interval H&P Note (Signed)
History and Physical Interval Note:  08/22/2020 12:38 PM  Leah Davies  has presented today for surgery, with the diagnosis of LT [facility] fistulagram  Spanish Interpreter Requested   End.  The various methods of treatment have been discussed with the patient and family. After consideration of risks, benefits and other options for treatment, the patient has consented to  Procedure(s): A/V FISTULAGRAM (Left) as a surgical intervention.  The patient's history has been reviewed, patient examined, no change in status, stable for surgery.  I have reviewed the patient's chart and labs.  Questions were answered to the patient's satisfaction.     Hortencia Pilar

## 2020-08-22 NOTE — Progress Notes (Signed)
Dr. Delana Meyer at bedside, explaining post procedural results re: fisulogram today with pt. & grandson Erlene Quan. Both verbalize understanding of conversation via interpretor.

## 2020-08-22 NOTE — Op Note (Signed)
OPERATIVE NOTE   PROCEDURE: 1. Contrast injection left arm brachiocephalic AV access 2. Percutaneous transluminal angioplasty of the arterial anastomosis to 4 mm left brachiocephalic fistula. 3. Introduction catheter into the aortic arch with left upper extremity angiography. 4. Percutaneous transluminal angioplasty of the radial artery to 2.5 mm. 5. Percutaneous transluminal angioplasty of the common ulnar artery to 4 mm with a Lutonix drug-eluting balloon  PRE-OPERATIVE DIAGNOSIS: Complication of dialysis access                                                       End Stage Renal Disease  POST-OPERATIVE DIAGNOSIS: same as above   SURGEON: Katha Cabal, M.D.  ANESTHESIA: Conscious sedation was administered under my direct supervision by the interventional radiology RN. IV Versed plus fentanyl were utilized. Continuous ECG, pulse oximetry and blood pressure was monitored throughout the entire procedure.  Conscious sedation was for a total of 98 minutes 48 seconds.  ESTIMATED BLOOD LOSS: minimal  FINDING(S): Stricture of the AV graft  SPECIMEN(S):  None  CONTRAST: 60 cc  FLUOROSCOPY TIME: 16.4 minutes  INDICATIONS: PRINCESSA LESMEISTER is a 79 y.o. female who  presents with malfunctioning left brachiocephalic AV access.  The patient is scheduled for angiography with possible intervention of the AV access.  The patient is aware the risks include but are not limited to: bleeding, infection, thrombosis of the cannulated access, and possible anaphylactic reaction to the contrast.  The patient acknowledges if the access can not be salvaged a tunneled catheter will be needed and will be placed during this procedure.  The patient is aware of the risks of the procedure and elects to proceed with the angiogram and intervention.  DESCRIPTION: After full informed written consent was obtained, the patient was brought back to the Special Procedure suite and placed supine position.  Appropriate  cardiopulmonary monitors were placed.  The left arm was prepped and draped in the standard fashion.  Appropriate timeout is called. The left brachiocephalic fistula was cannulated with a micropuncture needle at the level of the deltoid insertion in a retrograde direction.  This was done because the ultrasound suggested problems of low flow with a stenosis near the arterial anastomosis.  Cannulation was performed with ultrasound guidance. Ultrasound was placed in a sterile sleeve, the AV access was interrogated and noted to be echolucent and compressible indicating patency. Image was recorded for the permanent record. The puncture is performed under continuous ultrasound visualization.   The microwire was advanced and the needle was exchanged for  a microsheath.  The J-wire was then advanced and a 4 Fr sheath inserted.  Using a combination of a floppy Glidewire as well as an 0.018 advantage wire the anastomosis was crossed and the wire advanced into the mid brachial artery.  4 French straight glide catheter was then tracked over the wire.  From the brachial artery hand-injection of contrast was then used to image the mid to distal brachial artery as well as the actual anastomosis of the fistula and the cephalic vein to the level of the sheath insertion.  Diagnostic interpretation: The visualized portions of the brachial artery from its mid portion to the level of the anastomosis appeared to be widely patent.  On this injection there is minimal contrast coursing beyond the anastomosis.  The distal brachial artery fills poorly  and there is nonvisualization of the radial and ulnar arteries.  The anastomosis does appear to be slightly narrowed and was fairly difficult to cross.  The cephalic vein itself is now quite large and appears to be maturing faster than average.  There is a large tributary coursing off the cephalic vein proper that then goes retrograde down to the forearm loops around and comes back filling the  brachial vein.  Based on these findings I elected to treat the anastomosis but given the evidence of steal I felt it necessary to image the entire arterial system from the arch to the hand.  Based on the images,  3000 units of heparin was given (an additional 2000 was given later in the case) and a wire was already through the anastomosis.  An 4 mm x 40 mm Lutonix drug-eluting balloon was used.  Inflation was to 10 atm for 1 minute.  The catheter was then reintroduced and imaging demonstrated marked improvement in the morphology of the arterial anastomosis.  As would be expected expected there is no improvement in the distal flow of contrast toward the hand.  This point I exchanged 4 French straight glide catheter for a 125 cm 4 French angled glide catheter and negotiated the wire and catheter into the aortic arch.  Beginning at the level of the arch hand-injection of contrast was used to demonstrate the origin of the left subclavian as well as the subclavian axillary brachial artery in its entirety.  I then used the wire and catheter and negotiated the wire catheter distally so that I could image the arterial system in the forearm.  Again the glide catheter was used.  Once I obtain all images this is what I found.  Diagnostic interpretation: The aortic arch appears to have normal anatomy the great vessels appear to be widely patent at their origins including the left subclavian.  The left subclavian proper is widely patent as is the axillary.  The brachial artery from its beginning down to the arterial anastomosis of the fistula is widely patent.  Distally just beyond the anastomosis there appears to be a focal 70% stenosis in the brachial artery.  This extends into the common ulnar.  The radial artery demonstrates multiple subtotal occlusions in its proximal 8 cm.  Beyond these abnormalities the radial interosseous and ulnar arteries are widely patent but very small in the palmar arch appears to be  discontinuous.  This does support steal syndrome as a possibility.  I negotiated the wire into the radial artery and using a 2 mm balloon performed angioplasty of the radial artery.  Angioplasty was to 10 atm for approximately 1 minute.  I then used a 4 mm Lutonix balloon in the distal brachial over this wire and angioplasty was to 8 atm for 1 minute.  Follow-up imaging demonstrated what appeared to be significant spasm in combination with greater than 50% residual stenosis.  At this point I gave 400 mcg of nitroglycerin intra-arterially with modest improvement.  Next I took a 2.5 mm x 40 mm balloon and performed to serial angioplasties of the radial artery each to 8 to 10 atm for 1 minute.  The wire was then negotiated into the ulnar artery and a 4 mm x 40 mm Lutonix balloon was advanced across the distal brachial extending into the common ulnar and angioplasty to 8 atm performed for 1 minute.  Follow-up imaging now demonstrated patency of all 3 arteries there remains some modest irregularities but there was only a  15 to 20% residual stenosis.  I gave an additional 200 mcg of nitroglycerin.  At this point I renegotiated the wire into the proximal brachial artery followed by the catheter hand-injection of contrast demonstrated wide patency of the proximal brachial right patency of the arterial anastomosis with excellent filling of the fistula.  Although the filling of the distal brachial artery as well as the radial and ulnar arteries is still quite diminished there appears to be a slight improvement compared to the initial films.  However, I am still concerned regarding steal syndrome.  This point I elected to terminate the case.  A 4-0 Monocryl purse-string suture was sewn around the sheath.  The sheath was removed and light pressure was applied.  A sterile bandage was applied to the puncture site.    COMPLICATIONS: None  CONDITION: Carlynn Purl, M.D [facility] Office:  [phone]  08/22/2020 2:28 PM

## 2020-09-13 ENCOUNTER — Other Ambulatory Visit (INDEPENDENT_AMBULATORY_CARE_PROVIDER_SITE_OTHER): Payer: Self-pay | Admitting: Vascular Surgery

## 2020-09-13 DIAGNOSIS — T829XXA Unspecified complication of cardiac and vascular prosthetic device, implant and graft, initial encounter: Secondary | ICD-10-CM | POA: Insufficient documentation

## 2020-09-13 DIAGNOSIS — Z9862 Peripheral vascular angioplasty status: Secondary | ICD-10-CM

## 2020-09-13 NOTE — Progress Notes (Signed)
MRN : [id number]  Leah Davies is a 79 y.o. (04-Oct-1940) female who presents with chief complaint of No chief complaint on file. Marland Kitchen  History of Present Illness:   The patient returns to the office for followup status post intervention of the dialysis access 08/22/2020.   1. Percutaneous transluminal angioplasty of the arterial anastomosis to 4 mm left brachiocephalic fistula. 2. Introduction catheter into the aortic arch with left upper extremity angiography. 3. Percutaneous transluminal angioplasty of the radial artery to 2.5 mm. 4. Percutaneous transluminal angioplasty of the common ulnar artery to 4 mm with a [employer] drug-eluting balloon  Following the intervention the access function has significantly improved, with better flow rates and improved KT/V. The patient has not been experiencing increased bleeding times following decannulation and the patient denies increased recirculation. The patient denies an increase in arm swelling. At the present time the patient denies hand pain.  The patient denies amaurosis fugax or recent TIA symptoms. There are no recent neurological changes noted. The patient denies claudication symptoms or rest pain symptoms. The patient denies history of DVT, PE or superficial thrombophlebitis. The patient denies recent episodes of angina or shortness of breath.   Duplex ultrasound of the AV access shows a patent access with uniform velocities.  Flow volume is now 1063 ml/min)  No focal hemodynamically significant stenosis.    No outpatient medications have been marked as taking for the 09/14/20 encounter (Appointment) with Delana Meyer, Dolores Lory, MD.    Past Medical History:  Diagnosis Date   Chronic kidney disease    renal insufficiency   Complication of anesthesia    Vital signs low during colonoscopy had trouble to wake up    Elevated lipids    GERD (gastroesophageal reflux disease)    Gout    Hypertension     Past Surgical History:   Procedure Laterality Date   A/V FISTULAGRAM Left 08/22/2020   Procedure: A/V FISTULAGRAM;  Surgeon: Katha Cabal, MD;  Location: [facility];  Service: Cardiovascular;  Laterality: Left;   AV FISTULA PLACEMENT Left 07/28/2020   Procedure: ARTERIOVENOUS (AV) FISTULA CREATION (BRACHIAL CEPHALIC );  Surgeon: Katha Cabal, MD;  Location: [facility];  Service: Vascular;  Laterality: Left;   BREAST EXCISIONAL BIOPSY Left 2010   benign   BREAST SURGERY     Lt breast biopsy   DIALYSIS/PERMA CATHETER INSERTION N/A 04/10/2020   Procedure: DIALYSIS/PERMA CATHETER INSERTION;  Surgeon: Algernon Huxley, MD;  Location: [facility];  Service: Cardiovascular;  Laterality: N/A;   KNEE ARTHROSCOPY Left 10/05/2015   Procedure: ARTHROSCOPY KNEE partial medial and lateral meniscectomy, lateral release for sublux patella;  Surgeon: Hessie Knows, MD;  Location: [facility];  Service: Orthopedics;  Laterality: Left;    Social History Social History   Tobacco Use   Smoking status: Never Smoker   Smokeless tobacco: Never Used  Scientific laboratory technician Use: Never used  Substance Use Topics   Alcohol use: Not Currently    Comment: rare   Drug use: No    Family History Family History  Problem Relation Age of Onset   Breast cancer Sister 63    Allergies  Allergen Reactions   Amlodipine     Unknown   Azithromycin     Unknown     REVIEW OF SYSTEMS (Negative unless checked)  Constitutional: [] Weight loss  [] Fever  [] Chills Cardiac: [] Chest pain   [] Chest pressure   [] Palpitations   [] Shortness of breath when  laying flat   [] Shortness of breath with exertion. Vascular:  [] Pain in legs with walking   [] Pain in legs at rest  [] History of DVT   [] Phlebitis   [] Swelling in legs   [] Varicose veins   [] Non-healing ulcers Pulmonary:   [] Uses home oxygen   [] Productive cough   [] Hemoptysis   [] Wheeze  [] COPD   [] Asthma Neurologic:  [] Dizziness   [] Seizures   [] History of stroke    [] History of TIA  [] Aphasia   [] Vissual changes   [] Weakness or numbness in arm   [] Weakness or numbness in leg Musculoskeletal:   [] Joint swelling   [] Joint pain   [] Low back pain Hematologic:  [] Easy bruising  [] Easy bleeding   [] Hypercoagulable state   [] Anemic Gastrointestinal:  [] Diarrhea   [] Vomiting  [] Gastroesophageal reflux/heartburn   [] Difficulty swallowing. Genitourinary:  [x] Chronic kidney disease   [] Difficult urination  [] Frequent urination   [] Blood in urine Skin:  [] Rashes   [] Ulcers  Psychological:  [] History of anxiety   []  History of major depression.  Physical Examination  There were no vitals filed for this visit. There is no height or weight on file to calculate BMI. Gen: WD/WN, NAD Head: [place]/AT, No temporalis wasting.  Ear/Nose/Throat: Hearing grossly intact, nares w/o erythema or drainage Eyes: PER, EOMI, sclera nonicteric.  Neck: Supple, no large masses.   Pulmonary:  Good air movement, no audible wheezing bilaterally, no use of accessory muscles.  Cardiac: RRR, no JVD Vascular: left brachial access good thrill good bruit Vessel Right Left  Radial Palpable Palpable  Ulnar Palpable Palpable  Brachial Palpable Palpable  Gastrointestinal: Non-distended. No guarding/no peritoneal signs.  Musculoskeletal: M/S 5/5 throughout.  No deformity or atrophy.  Neurologic: CN 2-12 intact. Symmetrical.  Speech is fluent. Motor exam as listed above. Psychiatric: Judgment intact, Mood & affect appropriate for pt's clinical situation. Dermatologic: No rashes or ulcers noted.  No changes consistent with cellulitis.  CBC Lab Results  Component Value Date   WBC 7.5 07/25/2020   HGB 11.9 (L) 07/28/2020   HCT 35.0 (L) 07/28/2020   MCV 89.3 07/25/2020   PLT 162 07/25/2020    BMET    Component Value Date/Time   NA 133 (L) 07/28/2020 0906   K 4.4 07/28/2020 0906   CL 110 07/28/2020 0906   CO2 25 07/25/2020 0951   GLUCOSE 85 07/28/2020 0906   BUN 48 (H) 07/28/2020 0906    CREATININE 5.50 (H) 07/28/2020 0906   CALCIUM 8.6 (L) 07/25/2020 0951   GFRNONAA 11 (L) 07/25/2020 0951   GFRAA 8 (L) 06/26/2020 1925   CrCl cannot be calculated (Patient's most recent lab result is older than the maximum 21 days allowed.).  COAG Lab Results  Component Value Date   INR 1.0 07/25/2020    Radiology PERIPHERAL VASCULAR CATHETERIZATION  Result Date: 08/22/2020 See Op Note  VAS US DUPLEX DIALYSIS ACCESS (AVF, AVG)  Result Date: 08/21/2020 DIALYSIS ACCESS Access Site: Left Upper Extremity. Access Type: Brachial-cephalic AVF. Performing Technologist: Almira Coaster RVS  Examination Guidelines: A complete evaluation includes B-mode imaging, spectral Doppler, color Doppler, and power Doppler as needed of all accessible portions of each vessel. Unilateral testing is considered an integral part of a complete examination. Limited examinations for reoccurring indications may be performed as noted.  Findings: +--------------------+----------+-----------------+---------+  AVF                  PSV (cm/s) Flow Vol (mL/min) Comments   +--------------------+----------+-----------------+---------+  Native artery inflow    420  697        Stricture  +--------------------+----------+-----------------+---------+  AVF Anastomosis         565                                  +--------------------+----------+-----------------+---------+  +---------------+----------+-------------+----------+--------+  OUTFLOW VEIN    PSV (cm/s) Diameter (cm) Depth (cm) Describe  +---------------+----------+-------------+----------+--------+  Subclavian vein     35                                        +---------------+----------+-------------+----------+--------+  [place]         153                                        +---------------+----------+-------------+----------+--------+  Shoulder           121                                         +---------------+----------+-------------+----------+--------+  Prox UA            100                                        +---------------+----------+-------------+----------+--------+  Mid UA             113                                        +---------------+----------+-------------+----------+--------+  Dist UA            376                                        +---------------+----------+-------------+----------+--------+  +--------------+-------------+---------+---------+----------+------------------+                 Diameter (cm)   Depth   Branching PSV (cm/s)    Flow Volume                                      (cm)                              (ml/min)       +--------------+-------------+---------+---------+----------+------------------+  Lt Rad Art                                           65                          Dist                                                                            +--------------+-------------+---------+---------+----------+------------------+  Summary: The Left Brachial Cephalic AVF appears to be patent throughout; Flow Volume appears to be Normal. A Stricture was seen in the Inflow Artery; In the Mid/Proximal segment of the AVF there appears to be Non Occlusive Thrombus.  *See table(s) above for measurements and observations.  Diagnosing physician: Hortencia Pilar MD Electronically signed by Hortencia Pilar MD on 08/21/2020 at 4:47:18 PM.    --------------------------------------------------------------------------------   Final      Assessment/Plan 1. 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  OK to begin cannulating the fistula  The patient's dialysis center is not reporting any access issues.  Flow pattern is improved when compared to the prior ultrasound.  The patient should follow up in 3 months  No duplex at that time.  2. Chronic venous  insufficiency No surgery or intervention at this point in time.    I have reviewed my discussion with the patient regarding venous insufficiency and secondary lymph edema and why it  causes symptoms. I have discussed with the patient the chronic skin changes that accompany these problems and the long term sequela such as ulceration and infection.  Patient will continue wearing graduated compression stockings class 1 (20-30 mmHg) on a daily basis a prescription was given to the patient to keep this updated. The patient will  put the stockings on first thing in the morning and removing them in the evening. The patient is instructed specifically not to sleep in the stockings.  In addition, behavioral modification including elevation during the day will be continued.  Diet and salt restriction was also discussed.  Previous duplex ultrasound of the lower extremities shows normal deep venous system, superficial reflux was not present.    3. Essential hypertension Continue antihypertensive medications as already ordered, these medications have been reviewed and there are no changes at this time.   4. ESRD (end stage renal disease) ([place]) At the present time the patient has adequate dialysis access.  Continue hemodialysis as ordered without interruption.  Avoid nephrotoxic medications and dehydration.  Further plans per nephrology  5. Mixed hyperlipidemia Continue statin as ordered and reviewed, no changes at this time    Hortencia Pilar, MD  09/13/2020 11:08 AM

## 2020-09-14 ENCOUNTER — Ambulatory Visit (INDEPENDENT_AMBULATORY_CARE_PROVIDER_SITE_OTHER): Payer: Medicare HMO

## 2020-09-14 ENCOUNTER — Encounter (INDEPENDENT_AMBULATORY_CARE_PROVIDER_SITE_OTHER): Payer: Self-pay | Admitting: Vascular Surgery

## 2020-09-14 ENCOUNTER — Other Ambulatory Visit: Payer: Self-pay

## 2020-09-14 ENCOUNTER — Ambulatory Visit (INDEPENDENT_AMBULATORY_CARE_PROVIDER_SITE_OTHER): Payer: Medicare HMO | Admitting: Vascular Surgery

## 2020-09-14 VITALS — BP 148/77 | HR 77 | Ht 59.0 in | Wt 147.0 lb

## 2020-09-14 DIAGNOSIS — I10 Essential (primary) hypertension: Secondary | ICD-10-CM

## 2020-09-14 DIAGNOSIS — I872 Venous insufficiency (chronic) (peripheral): Secondary | ICD-10-CM

## 2020-09-14 DIAGNOSIS — Z9862 Peripheral vascular angioplasty status: Secondary | ICD-10-CM

## 2020-09-14 DIAGNOSIS — E782 Mixed hyperlipidemia: Secondary | ICD-10-CM

## 2020-09-14 DIAGNOSIS — N186 End stage renal disease: Secondary | ICD-10-CM

## 2020-09-14 DIAGNOSIS — T829XXS Unspecified complication of cardiac and vascular prosthetic device, implant and graft, sequela: Secondary | ICD-10-CM

## 2020-09-16 ENCOUNTER — Encounter (INDEPENDENT_AMBULATORY_CARE_PROVIDER_SITE_OTHER): Payer: Self-pay | Admitting: Vascular Surgery

## 2020-11-16 ENCOUNTER — Telehealth (INDEPENDENT_AMBULATORY_CARE_PROVIDER_SITE_OTHER): Payer: Self-pay

## 2020-11-16 NOTE — Telephone Encounter (Signed)
A fax was received from [facility] at [facility] for a permcath removal. Patient is scheduled with Dr. Delana Meyer on 11/28/20 with a 1:00 pm arrival time to the [facility]. Covid testing is on 11/24/20 between 8-1 pm at the [facility]. Pre-procedure instructions will be faxed to [facility] at [facility].

## 2020-11-24 ENCOUNTER — Other Ambulatory Visit: Admission: RE | Admit: 2020-11-24 | Payer: Medicare HMO | Source: Ambulatory Visit

## 2020-11-27 ENCOUNTER — Other Ambulatory Visit (INDEPENDENT_AMBULATORY_CARE_PROVIDER_SITE_OTHER): Payer: Self-pay | Admitting: Nurse Practitioner

## 2020-11-28 ENCOUNTER — Ambulatory Visit: Admission: RE | Admit: 2020-11-28 | Payer: Medicare HMO | Source: Home / Self Care | Admitting: Vascular Surgery

## 2020-11-28 ENCOUNTER — Encounter: Admission: RE | Payer: Self-pay | Source: Home / Self Care

## 2020-11-28 DIAGNOSIS — N186 End stage renal disease: Secondary | ICD-10-CM

## 2020-11-28 SURGERY — DIALYSIS/PERMA CATHETER REMOVAL
Anesthesia: LOCAL

## 2020-11-29 DIAGNOSIS — N186 End stage renal disease: Secondary | ICD-10-CM

## 2020-12-05 ENCOUNTER — Ambulatory Visit
Admission: RE | Admit: 2020-12-05 | Discharge: 2020-12-05 | Disposition: A | Discharge status: Home / Self Care | Payer: Medicare HMO | Source: Ambulatory Visit | Attending: Internal Medicine | Admitting: Internal Medicine

## 2020-12-05 ENCOUNTER — Other Ambulatory Visit: Payer: Self-pay

## 2020-12-05 ENCOUNTER — Other Ambulatory Visit: Payer: Self-pay | Admitting: Internal Medicine

## 2020-12-05 DIAGNOSIS — M7989 Other specified soft tissue disorders: Secondary | ICD-10-CM | POA: Diagnosis present

## 2020-12-06 ENCOUNTER — Other Ambulatory Visit: Payer: Medicare HMO

## 2020-12-06 ENCOUNTER — Other Ambulatory Visit: Payer: Self-pay | Admitting: Internal Medicine

## 2020-12-06 DIAGNOSIS — M7989 Other specified soft tissue disorders: Secondary | ICD-10-CM

## 2020-12-06 NOTE — Progress Notes (Signed)
MRN : [id number]  Leah Davies is a 80 y.o. (July 12, 1941) female who presents with chief complaint of No chief complaint on file. Marland Kitchen  History of Present Illness:  The patient returns to the office for follow up regarding problem with the dialysis access. Currently the patient is maintained via a a right IJ tunneled catheter.  Her left arm brachiocephalic fistula has been tried on several occasions but multiple infiltrations and hematomas have occurred.  The patient denies hand pain or other symptoms consistent with steal phenomena.  No significant arm swelling.  The patient denies redness or swelling at the access site. The patient denies fever or chills at home or while on dialysis.  The patient denies amaurosis fugax or recent TIA symptoms. There are no recent neurological changes noted. The patient denies claudication symptoms or rest pain symptoms. The patient denies history of DVT, PE or superficial thrombophlebitis. The patient denies recent episodes of angina or shortness of breath.       No outpatient medications have been marked as taking for the 12/07/20 encounter (Appointment) with Delana Meyer, Dolores Lory, MD.    Past Medical History:  Diagnosis Date  . Chronic kidney disease    renal insufficiency  . Complication of anesthesia    Vital signs low during colonoscopy had trouble to wake up   . Elevated lipids   . GERD (gastroesophageal reflux disease)   . Gout   . Hypertension     Past Surgical History:  Procedure Laterality Date  . A/V FISTULAGRAM Left 08/22/2020   Procedure: A/V FISTULAGRAM;  Surgeon: Katha Cabal, MD;  Location: [facility];  Service: Cardiovascular;  Laterality: Left;  . AV FISTULA PLACEMENT Left 07/28/2020   Procedure: ARTERIOVENOUS (AV) FISTULA CREATION (BRACHIAL CEPHALIC );  Surgeon: Katha Cabal, MD;  Location: [facility];  Service: Vascular;  Laterality: Left;  . BREAST EXCISIONAL BIOPSY Left 2010   benign  . BREAST SURGERY      Lt breast biopsy  . DIALYSIS/PERMA CATHETER INSERTION N/A 04/10/2020   Procedure: DIALYSIS/PERMA CATHETER INSERTION;  Surgeon: Algernon Huxley, MD;  Location: [facility];  Service: Cardiovascular;  Laterality: N/A;  . KNEE ARTHROSCOPY Left 10/05/2015   Procedure: ARTHROSCOPY KNEE partial medial and lateral meniscectomy, lateral release for sublux patella;  Surgeon: Hessie Knows, MD;  Location: [facility];  Service: Orthopedics;  Laterality: Left;    Social History Social History   Tobacco Use  . Smoking status: Never Smoker  . Smokeless tobacco: Never Used  Vaping Use  . Vaping Use: Never used  Substance Use Topics  . Alcohol use: Not Currently    Comment: rare  . Drug use: No    Family History Family History  Problem Relation Age of Onset  . Breast cancer Sister 44    Allergies  Allergen Reactions  . Amlodipine     Unknown  . Azithromycin     Unknown     REVIEW OF SYSTEMS (Negative unless checked)  Constitutional: '[]'$ Weight loss  '[]'$ Fever  '[]'$ Chills Cardiac: '[]'$ Chest pain   '[]'$ Chest pressure   '[]'$ Palpitations   '[]'$ Shortness of breath when laying flat   '[]'$ Shortness of breath with exertion. Vascular:  '[]'$ Pain in legs with walking   '[x]'$ Pain in legs at rest  '[]'$ History of DVT   '[]'$ Phlebitis   '[x]'$ Swelling in legs   '[]'$ Varicose veins   '[]'$ Non-healing ulcers Pulmonary:   '[]'$ Uses home oxygen   '[]'$ Productive cough   '[]'$ Hemoptysis   '[]'$ Wheeze  '[]'$ COPD   '[]'$   Asthma Neurologic:  '[]'$ Dizziness   '[]'$ Seizures   '[]'$ History of stroke   '[]'$ History of TIA  '[]'$ Aphasia   '[]'$ Vissual changes   '[]'$ Weakness or numbness in arm   '[]'$ Weakness or numbness in leg Musculoskeletal:   '[]'$ Joint swelling   '[x]'$ Joint pain   '[]'$ Low back pain Hematologic:  '[]'$ Easy bruising  '[]'$ Easy bleeding   '[]'$ Hypercoagulable state   '[]'$ Anemic Gastrointestinal:  '[]'$ Diarrhea   '[]'$ Vomiting  '[x]'$ Gastroesophageal reflux/heartburn   '[]'$ Difficulty swallowing. Genitourinary:  '[x]'$ Chronic kidney disease   '[]'$ Difficult urination  '[]'$ Frequent urination   '[]'$ Blood in  urine Skin:  '[]'$ Rashes   '[]'$ Ulcers  Psychological:  '[]'$ History of anxiety   '[]'$  History of major depression.  Physical Examination  There were no vitals filed for this visit. There is no height or weight on file to calculate BMI. Gen: WD/WN, NAD Head: [place]/AT, No temporalis wasting.  Ear/Nose/Throat: Hearing grossly intact, nares w/o erythema or drainage Eyes: PER, EOMI, sclera nonicteric.  Neck: Supple, no large masses.   Pulmonary:  Good air movement, no audible wheezing bilaterally, no use of accessory muscles.  Cardiac: RRR, no JVD Vascular: Left arm brachiocephalic fistula excellent thrill excellent bruit ecchymoses and palpable hematoma is noted. Vessel Right Left  Radial Palpable Palpable  Gastrointestinal: Non-distended. No guarding/no peritoneal signs.  Musculoskeletal: M/S 5/5 throughout.  No deformity or atrophy.  Neurologic: CN 2-12 intact. Symmetrical.  Speech is fluent. Motor exam as listed above. Psychiatric: Judgment intact, Mood & affect appropriate for pt's clinical situation. Dermatologic: Venous rashes no ulcers noted.  No changes consistent with cellulitis.   CBC Lab Results  Component Value Date   WBC 7.5 07/25/2020   HGB 11.9 (L) 07/28/2020   HCT 35.0 (L) 07/28/2020   MCV 89.3 07/25/2020   PLT 162 07/25/2020    BMET    Component Value Date/Time   NA 133 (L) 07/28/2020 0906   K 4.4 07/28/2020 0906   CL 110 07/28/2020 0906   CO2 25 07/25/2020 0951   GLUCOSE 85 07/28/2020 0906   BUN 48 (H) 07/28/2020 0906   CREATININE 5.50 (H) 07/28/2020 0906   CALCIUM 8.6 (L) 07/25/2020 0951   GFRNONAA 11 (L) 07/25/2020 0951   GFRAA 8 (L) 06/26/2020 1925   CrCl cannot be calculated (Patient's most recent lab result is older than the maximum 21 days allowed.).  COAG Lab Results  Component Value Date   INR 1.0 07/25/2020    Radiology US Venous Img Upper Uni Left (DVT)  Result Date: 12/05/2020 CLINICAL DATA:  Soft tissue swelling. Surgically created arteriovenous  fistula in the brachiocephalic region. EXAM: LEFT UPPER EXTREMITY VENOUS DUPLEX ULTRASOUND TECHNIQUE: Gray-scale sonography with graded compression, as well as color Doppler and duplex ultrasound were performed to evaluate the left upper extremity deep venous system from the level of the subclavian vein and including the jugular, axillary, basilic, radial, ulnar and upper cephalic vein. Spectral Doppler was utilized to evaluate flow at rest and with distal augmentation maneuvers. COMPARISON:  None. FINDINGS: Contralateral Subclavian Vein: Respiratory phasicity is normal and symmetric with the symptomatic side. No evidence of thrombus. Normal compressibility. Internal Jugular Vein: No evidence of thrombus. Normal compressibility, respiratory phasicity and response to augmentation. Subclavian Vein: No evidence of thrombus. Normal compressibility, respiratory phasicity and response to augmentation. Axillary Vein: No evidence of thrombus. Normal compressibility, respiratory phasicity and response to augmentation. Cephalic Vein: No evidence of thrombus. Normal compressibility, respiratory phasicity and response to augmentation. Basilic Vein: No evidence of thrombus. Normal compressibility, respiratory phasicity and response to augmentation. Brachial Veins:  No evidence of thrombus. Normal compressibility, respiratory phasicity and response to augmentation. Radial Veins: No evidence of thrombus. Normal compressibility, respiratory phasicity and response to augmentation. Ulnar Veins: No evidence of thrombus. Normal compressibility, respiratory phasicity and response to augmentation. Venous Reflux:  None visualized. Other Findings: There is a surgically created brachiocephalic region arteriovenous fistula which is patent. Adjacent to the lower cephalic vein, there is a mildly complex avascular structure containing septation and internal echoes measuring 2.2 x 0.8 x 1.7 cm. No similar lesions of this nature elsewhere.  IMPRESSION: 1. No evident deep venous thrombosis in the left upper extremity. Right common femoral vein also patent. 2.  Patent brachiocephalic arteriovenous fistula on the left. 3. Complex predominantly cystic avascular structure near the left cephalic vein measuring 2.2 x 0.8 x 1.7 cm. Suspect partially liquified hematoma as most likely etiology. Electronically Signed   By: Lowella Grip III M.D.   On: 12/05/2020 13:39     Assessment/Plan 1. Complication from renal dialysis device, sequela Recommend:  The patient is experiencing increasing problems with their dialysis access.  Patient should have a fistulagram with the intention for intervention.  The intention for intervention is to restore appropriate flow and prevent thrombosis and possible loss of the access.  As well as improve the quality of dialysis therapy.  The risks, benefits and alternative therapies were reviewed in detail with the patient.  All questions were answered.  The patient agrees to proceed with angio/intervention.      2. ESRD (end stage renal disease) ([place]) At the present time the patient has adequate dialysis access.  Continue hemodialysis as ordered without interruption.  Avoid nephrotoxic medications and dehydration.  Further plans per nephrology  3. Essential hypertension Continue antihypertensive medications as already ordered, these medications have been reviewed and there are no changes at this time.   4. Gastroesophageal reflux disease, unspecified whether esophagitis present Continue PPI as already ordered, this medication has been reviewed and there are no changes at this time.  Avoidence of caffeine and alcohol  Moderate elevation of the head of the bed     Hortencia Pilar, MD  12/06/2020 8:29 PM

## 2020-12-06 NOTE — H&P (View-Only) (Signed)
MRN : [id number]  Leah Davies is a 80 y.o. (1941/05/19) female who presents with chief complaint of No chief complaint on file. Marland Kitchen  History of Present Illness:  The patient returns to the office for follow up regarding problem with the dialysis access. Currently the patient is maintained via a a right IJ tunneled catheter.  Her left arm brachiocephalic fistula has been tried on several occasions but multiple infiltrations and hematomas have occurred.  The patient denies hand pain or other symptoms consistent with steal phenomena.  No significant arm swelling.  The patient denies redness or swelling at the access site. The patient denies fever or chills at home or while on dialysis.  The patient denies amaurosis fugax or recent TIA symptoms. There are no recent neurological changes noted. The patient denies claudication symptoms or rest pain symptoms. The patient denies history of DVT, PE or superficial thrombophlebitis. The patient denies recent episodes of angina or shortness of breath.       No outpatient medications have been marked as taking for the 12/07/20 encounter (Appointment) with Delana Meyer, Dolores Lory, MD.    Past Medical History:  Diagnosis Date  . Chronic kidney disease    renal insufficiency  . Complication of anesthesia    Vital signs low during colonoscopy had trouble to wake up   . Elevated lipids   . GERD (gastroesophageal reflux disease)   . Gout   . Hypertension     Past Surgical History:  Procedure Laterality Date  . A/V FISTULAGRAM Left 08/22/2020   Procedure: A/V FISTULAGRAM;  Surgeon: Katha Cabal, MD;  Location: [place] [facility];  Service: Cardiovascular;  Laterality: Left;  . AV FISTULA PLACEMENT Left 07/28/2020   Procedure: ARTERIOVENOUS (AV) FISTULA CREATION (BRACHIAL CEPHALIC );  Surgeon: Katha Cabal, MD;  Location: [facility];  Service: Vascular;  Laterality: Left;  . BREAST EXCISIONAL BIOPSY Left 2010   benign  . BREAST SURGERY      Lt breast biopsy  . DIALYSIS/PERMA CATHETER INSERTION N/A 04/10/2020   Procedure: DIALYSIS/PERMA CATHETER INSERTION;  Surgeon: Algernon Huxley, MD;  Location: [place] [facility];  Service: Cardiovascular;  Laterality: N/A;  . KNEE ARTHROSCOPY Left 10/05/2015   Procedure: ARTHROSCOPY KNEE partial medial and lateral meniscectomy, lateral release for sublux patella;  Surgeon: Hessie Knows, MD;  Location: [facility];  Service: Orthopedics;  Laterality: Left;    Social History Social History   Tobacco Use  . Smoking status: Never Smoker  . Smokeless tobacco: Never Used  Vaping Use  . Vaping Use: Never used  Substance Use Topics  . Alcohol use: Not Currently    Comment: rare  . Drug use: No    Family History Family History  Problem Relation Age of Onset  . Breast cancer Sister 26    Allergies  Allergen Reactions  . Amlodipine     Unknown  . Azithromycin     Unknown     REVIEW OF SYSTEMS (Negative unless checked)  Constitutional: '[]'$ Weight loss  '[]'$ Fever  '[]'$ Chills Cardiac: '[]'$ Chest pain   '[]'$ Chest pressure   '[]'$ Palpitations   '[]'$ Shortness of breath when laying flat   '[]'$ Shortness of breath with exertion. Vascular:  '[]'$ Pain in legs with walking   '[x]'$ Pain in legs at rest  '[]'$ History of DVT   '[]'$ Phlebitis   '[x]'$ Swelling in legs   '[]'$ Varicose veins   '[]'$ Non-healing ulcers Pulmonary:   '[]'$ Uses home oxygen   '[]'$ Productive cough   '[]'$ Hemoptysis   '[]'$ Wheeze  '[]'$ COPD   '[]'$   Asthma Neurologic:  '[]'$ Dizziness   '[]'$ Seizures   '[]'$ History of stroke   '[]'$ History of TIA  '[]'$ Aphasia   '[]'$ Vissual changes   '[]'$ Weakness or numbness in arm   '[]'$ Weakness or numbness in leg Musculoskeletal:   '[]'$ Joint swelling   '[x]'$ Joint pain   '[]'$ Low back pain Hematologic:  '[]'$ Easy bruising  '[]'$ Easy bleeding   '[]'$ Hypercoagulable state   '[]'$ Anemic Gastrointestinal:  '[]'$ Diarrhea   '[]'$ Vomiting  '[x]'$ Gastroesophageal reflux/heartburn   '[]'$ Difficulty swallowing. Genitourinary:  '[x]'$ Chronic kidney disease   '[]'$ Difficult urination  '[]'$ Frequent urination   '[]'$ Blood in  urine Skin:  '[]'$ Rashes   '[]'$ Ulcers  Psychological:  '[]'$ History of anxiety   '[]'$  History of major depression.  Physical Examination  There were no vitals filed for this visit. There is no height or weight on file to calculate BMI. Gen: WD/WN, NAD Head: [place]/AT, No temporalis wasting.  Ear/Nose/Throat: Hearing grossly intact, nares w/o erythema or drainage Eyes: PER, EOMI, sclera nonicteric.  Neck: Supple, no large masses.   Pulmonary:  Good air movement, no audible wheezing bilaterally, no use of accessory muscles.  Cardiac: RRR, no JVD Vascular: Left arm brachiocephalic fistula excellent thrill excellent bruit ecchymoses and palpable hematoma is noted. Vessel Right Left  Radial Palpable Palpable  Gastrointestinal: Non-distended. No guarding/no peritoneal signs.  Musculoskeletal: M/S 5/5 throughout.  No deformity or atrophy.  Neurologic: CN 2-12 intact. Symmetrical.  Speech is fluent. Motor exam as listed above. Psychiatric: Judgment intact, Mood & affect appropriate for pt's clinical situation. Dermatologic: Venous rashes no ulcers noted.  No changes consistent with cellulitis.   CBC Lab Results  Component Value Date   WBC 7.5 07/25/2020   HGB 11.9 (L) 07/28/2020   HCT 35.0 (L) 07/28/2020   MCV 89.3 07/25/2020   PLT 162 07/25/2020    BMET    Component Value Date/Time   NA 133 (L) 07/28/2020 0906   K 4.4 07/28/2020 0906   CL 110 07/28/2020 0906   CO2 25 07/25/2020 0951   GLUCOSE 85 07/28/2020 0906   BUN 48 (H) 07/28/2020 0906   CREATININE 5.50 (H) 07/28/2020 0906   CALCIUM 8.6 (L) 07/25/2020 0951   GFRNONAA 11 (L) 07/25/2020 0951   GFRAA 8 (L) 06/26/2020 1925   CrCl cannot be calculated (Patient's most recent lab result is older than the maximum 21 days allowed.).  COAG Lab Results  Component Value Date   INR 1.0 07/25/2020    Radiology US Venous Img Upper Uni Left (DVT)  Result Date: 12/05/2020 CLINICAL DATA:  Soft tissue swelling. Surgically created arteriovenous  fistula in the brachiocephalic region. EXAM: LEFT UPPER EXTREMITY VENOUS DUPLEX ULTRASOUND TECHNIQUE: Gray-scale sonography with graded compression, as well as color Doppler and duplex ultrasound were performed to evaluate the left upper extremity deep venous system from the level of the subclavian vein and including the jugular, axillary, basilic, radial, ulnar and upper cephalic vein. Spectral Doppler was utilized to evaluate flow at rest and with distal augmentation maneuvers. COMPARISON:  None. FINDINGS: Contralateral Subclavian Vein: Respiratory phasicity is normal and symmetric with the symptomatic side. No evidence of thrombus. Normal compressibility. Internal Jugular Vein: No evidence of thrombus. Normal compressibility, respiratory phasicity and response to augmentation. Subclavian Vein: No evidence of thrombus. Normal compressibility, respiratory phasicity and response to augmentation. Axillary Vein: No evidence of thrombus. Normal compressibility, respiratory phasicity and response to augmentation. Cephalic Vein: No evidence of thrombus. Normal compressibility, respiratory phasicity and response to augmentation. Basilic Vein: No evidence of thrombus. Normal compressibility, respiratory phasicity and response to augmentation. Brachial Veins:  No evidence of thrombus. Normal compressibility, respiratory phasicity and response to augmentation. Radial Veins: No evidence of thrombus. Normal compressibility, respiratory phasicity and response to augmentation. Ulnar Veins: No evidence of thrombus. Normal compressibility, respiratory phasicity and response to augmentation. Venous Reflux:  None visualized. Other Findings: There is a surgically created brachiocephalic region arteriovenous fistula which is patent. Adjacent to the lower cephalic vein, there is a mildly complex avascular structure containing septation and internal echoes measuring 2.2 x 0.8 x 1.7 cm. No similar lesions of this nature elsewhere.  IMPRESSION: 1. No evident deep venous thrombosis in the left upper extremity. Right common femoral vein also patent. 2.  Patent brachiocephalic arteriovenous fistula on the left. 3. Complex predominantly cystic avascular structure near the left cephalic vein measuring 2.2 x 0.8 x 1.7 cm. Suspect partially liquified hematoma as most likely etiology. Electronically Signed   By: Lowella Grip III M.D.   On: 12/05/2020 13:39     Assessment/Plan 1. Complication from renal dialysis device, sequela Recommend:  The patient is experiencing increasing problems with their dialysis access.  Patient should have a fistulagram with the intention for intervention.  The intention for intervention is to restore appropriate flow and prevent thrombosis and possible loss of the access.  As well as improve the quality of dialysis therapy.  The risks, benefits and alternative therapies were reviewed in detail with the patient.  All questions were answered.  The patient agrees to proceed with angio/intervention.      2. ESRD (end stage renal disease) ([place]) At the present time the patient has adequate dialysis access.  Continue hemodialysis as ordered without interruption.  Avoid nephrotoxic medications and dehydration.  Further plans per nephrology  3. Essential hypertension Continue antihypertensive medications as already ordered, these medications have been reviewed and there are no changes at this time.   4. Gastroesophageal reflux disease, unspecified whether esophagitis present Continue PPI as already ordered, this medication has been reviewed and there are no changes at this time.  Avoidence of caffeine and alcohol  Moderate elevation of the head of the bed     Hortencia Pilar, MD  12/06/2020 8:29 PM

## 2020-12-07 ENCOUNTER — Encounter (INDEPENDENT_AMBULATORY_CARE_PROVIDER_SITE_OTHER): Payer: Self-pay | Admitting: Vascular Surgery

## 2020-12-07 ENCOUNTER — Other Ambulatory Visit: Payer: Self-pay

## 2020-12-07 ENCOUNTER — Ambulatory Visit (INDEPENDENT_AMBULATORY_CARE_PROVIDER_SITE_OTHER): Payer: Medicare HMO | Admitting: Vascular Surgery

## 2020-12-07 VITALS — BP 127/68 | HR 63 | Ht 59.0 in | Wt 148.0 lb

## 2020-12-07 DIAGNOSIS — I10 Essential (primary) hypertension: Secondary | ICD-10-CM

## 2020-12-07 DIAGNOSIS — N186 End stage renal disease: Secondary | ICD-10-CM

## 2020-12-07 DIAGNOSIS — T829XXS Unspecified complication of cardiac and vascular prosthetic device, implant and graft, sequela: Secondary | ICD-10-CM | POA: Diagnosis not present

## 2020-12-07 DIAGNOSIS — M7989 Other specified soft tissue disorders: Secondary | ICD-10-CM

## 2020-12-07 DIAGNOSIS — K219 Gastro-esophageal reflux disease without esophagitis: Secondary | ICD-10-CM | POA: Diagnosis not present

## 2020-12-07 DIAGNOSIS — I872 Venous insufficiency (chronic) (peripheral): Secondary | ICD-10-CM

## 2020-12-11 ENCOUNTER — Telehealth (INDEPENDENT_AMBULATORY_CARE_PROVIDER_SITE_OTHER): Payer: Self-pay

## 2020-12-11 NOTE — Telephone Encounter (Signed)
Patient's grandson called to schedule the patient for her left arm fistulagram on 01/02/21 with a 1:00 pm arrival time to the [facility] with Dr. Delana Meyer. Covid testing on 12/29/20 between 8-2 pm at the [facility]. Pre-procedure instructions were discussed and will be mailed.

## 2020-12-29 ENCOUNTER — Other Ambulatory Visit
Admission: RE | Admit: 2020-12-29 | Discharge: 2020-12-29 | Disposition: A | Discharge status: Home / Self Care | Payer: Medicare HMO | Source: Ambulatory Visit | Attending: Vascular Surgery | Admitting: Vascular Surgery

## 2020-12-29 ENCOUNTER — Other Ambulatory Visit: Payer: Self-pay

## 2020-12-29 DIAGNOSIS — Z01812 Encounter for preprocedural laboratory examination: Secondary | ICD-10-CM | POA: Diagnosis present

## 2020-12-29 DIAGNOSIS — Z20822 Contact with and (suspected) exposure to covid-19: Secondary | ICD-10-CM | POA: Insufficient documentation

## 2020-12-30 LAB — SARS CORONAVIRUS 2 (TAT 6-24 HRS): SARS Coronavirus 2: NEGATIVE

## 2021-01-02 ENCOUNTER — Encounter
Admission: RE | Disposition: A | Discharge status: Home / Self Care | Payer: Self-pay | Source: Home / Self Care | Attending: Vascular Surgery

## 2021-01-02 ENCOUNTER — Other Ambulatory Visit (INDEPENDENT_AMBULATORY_CARE_PROVIDER_SITE_OTHER): Payer: Self-pay | Admitting: Nurse Practitioner

## 2021-01-02 ENCOUNTER — Encounter: Payer: Self-pay | Admitting: Vascular Surgery

## 2021-01-02 ENCOUNTER — Other Ambulatory Visit: Payer: Self-pay

## 2021-01-02 ENCOUNTER — Ambulatory Visit
Admission: RE | Admit: 2021-01-02 | Discharge: 2021-01-02 | Disposition: A | Discharge status: Home / Self Care | Payer: Medicare HMO | Attending: Vascular Surgery | Admitting: Vascular Surgery

## 2021-01-02 DIAGNOSIS — Y841 Kidney dialysis as the cause of abnormal reaction of the patient, or of later complication, without mention of misadventure at the time of the procedure: Secondary | ICD-10-CM | POA: Diagnosis not present

## 2021-01-02 DIAGNOSIS — I120 Hypertensive chronic kidney disease with stage 5 chronic kidney disease or end stage renal disease: Secondary | ICD-10-CM | POA: Insufficient documentation

## 2021-01-02 DIAGNOSIS — Z888 Allergy status to other drugs, medicaments and biological substances status: Secondary | ICD-10-CM | POA: Diagnosis not present

## 2021-01-02 DIAGNOSIS — N186 End stage renal disease: Secondary | ICD-10-CM

## 2021-01-02 DIAGNOSIS — T82898A Other specified complication of vascular prosthetic devices, implants and grafts, initial encounter: Secondary | ICD-10-CM | POA: Diagnosis not present

## 2021-01-02 DIAGNOSIS — Z881 Allergy status to other antibiotic agents status: Secondary | ICD-10-CM | POA: Insufficient documentation

## 2021-01-02 DIAGNOSIS — T82510A Breakdown (mechanical) of surgically created arteriovenous fistula, initial encounter: Secondary | ICD-10-CM | POA: Insufficient documentation

## 2021-01-02 DIAGNOSIS — Z992 Dependence on renal dialysis: Secondary | ICD-10-CM | POA: Insufficient documentation

## 2021-01-02 DIAGNOSIS — K219 Gastro-esophageal reflux disease without esophagitis: Secondary | ICD-10-CM | POA: Insufficient documentation

## 2021-01-02 HISTORY — PX: A/V FISTULAGRAM: CATH118298

## 2021-01-02 LAB — POTASSIUM (ARMC VASCULAR LAB ONLY): Potassium (ARMC vascular lab): 4.8 (ref 3.5–5.1)

## 2021-01-02 SURGERY — A/V FISTULAGRAM
Anesthesia: Moderate Sedation | Laterality: Left

## 2021-01-02 MED ORDER — HEPARIN SODIUM (PORCINE) 1000 UNIT/ML IJ SOLN
INTRAMUSCULAR | Status: AC
  Filled 2021-01-02: qty 1

## 2021-01-02 MED ORDER — FAMOTIDINE 20 MG PO TABS: 40.0000 mg | ORAL_TABLET | Freq: Once | ORAL | Status: DC | PRN

## 2021-01-02 MED ORDER — METHYLPREDNISOLONE SODIUM SUCC 125 MG IJ SOLR: 125.0000 mg | Freq: Once | INTRAMUSCULAR | Status: DC | PRN

## 2021-01-02 MED ORDER — DIPHENHYDRAMINE HCL 50 MG/ML IJ SOLN: 50.0000 mg | Freq: Once | INTRAMUSCULAR | Status: DC | PRN

## 2021-01-02 MED ORDER — IODIXANOL 320 MG/ML IV SOLN
INTRAVENOUS | Status: DC | PRN
  Administered 2021-01-02: 20 mL via INTRAVENOUS

## 2021-01-02 MED ORDER — HYDROMORPHONE HCL 1 MG/ML IJ SOLN: 1.0000 mg | Freq: Once | INTRAMUSCULAR | Status: DC | PRN

## 2021-01-02 MED ORDER — FENTANYL CITRATE (PF) 100 MCG/2ML IJ SOLN
INTRAMUSCULAR | Status: DC | PRN
  Administered 2021-01-02: 50 ug via INTRAVENOUS

## 2021-01-02 MED ORDER — MIDAZOLAM HCL 2 MG/2ML IJ SOLN
INTRAMUSCULAR | Status: AC
  Filled 2021-01-02: qty 2

## 2021-01-02 MED ORDER — SODIUM CHLORIDE 0.9 % IV SOLN: INTRAVENOUS | Status: DC

## 2021-01-02 MED ORDER — MIDAZOLAM HCL 2 MG/ML PO SYRP: 8.0000 mg | ORAL_SOLUTION | Freq: Once | ORAL | Status: DC | PRN

## 2021-01-02 MED ORDER — HEPARIN SODIUM (PORCINE) 1000 UNIT/ML IJ SOLN
INTRAMUSCULAR | Status: DC | PRN
Start: 2021-01-02 — End: 2021-01-02
  Administered 2021-01-02: 3000 [IU] via INTRAVENOUS

## 2021-01-02 MED ORDER — MIDAZOLAM HCL 2 MG/2ML IJ SOLN
INTRAMUSCULAR | Status: DC | PRN
  Administered 2021-01-02: 2 mg via INTRAVENOUS

## 2021-01-02 MED ORDER — CEFAZOLIN SODIUM-DEXTROSE 1-4 GM/50ML-% IV SOLN
INTRAVENOUS | Status: AC
  Filled 2021-01-02: qty 50

## 2021-01-02 MED ORDER — ONDANSETRON HCL 4 MG/2ML IJ SOLN: 4.0000 mg | Freq: Four times a day (QID) | INTRAMUSCULAR | Status: DC | PRN

## 2021-01-02 MED ORDER — CEFAZOLIN SODIUM-DEXTROSE 1-4 GM/50ML-% IV SOLN: 1.0000 g | Freq: Once | INTRAVENOUS | Status: DC

## 2021-01-02 MED ORDER — FENTANYL CITRATE (PF) 100 MCG/2ML IJ SOLN
INTRAMUSCULAR | Status: AC
  Filled 2021-01-02: qty 2

## 2021-01-02 SURGICAL SUPPLY — 10 items
BALLN DORADO 8X60X80 (BALLOONS) ×2
BALLOON DORADO 8X60X80 (BALLOONS) ×1 IMPLANT
CANNULA 5F STIFF (CANNULA) ×2 IMPLANT
COVER PROBE U/S 5X48 (MISCELLANEOUS) ×2 IMPLANT
DRAPE BRACHIAL (DRAPES) ×2 IMPLANT
KIT ENCORE 26 ADVANTAGE (KITS) ×2 IMPLANT
PACK ANGIOGRAPHY (CUSTOM PROCEDURE TRAY) ×2 IMPLANT
SHEATH BRITE TIP 6FRX5.5 (SHEATH) ×2 IMPLANT
SUT MNCRL AB 4-0 PS2 18 (SUTURE) ×2 IMPLANT
WIRE MAGIC TOR.035 180C (WIRE) ×2 IMPLANT

## 2021-01-02 NOTE — Interval H&P Note (Signed)
History and Physical Interval Note:  01/02/2021 3:55 PM  Leah Davies  has presented today for surgery, with the diagnosis of LT arm fistulagram   End Stage Renal Covid  April 1.  The various methods of treatment have been discussed with the patient and family. After consideration of risks, benefits and other options for treatment, the patient has consented to  Procedure(s): A/V FISTULAGRAM (Left) as a surgical intervention.  The patient's history has been reviewed, patient examined, no change in status, stable for surgery.  I have reviewed the patient's chart and labs.  Questions were answered to the patient's satisfaction.     Hortencia Pilar

## 2021-01-02 NOTE — Op Note (Signed)
OPERATIVE NOTE   PROCEDURE: 1. Contrast injection left brachiocephalic AV access Percutaneous transluminal angioplasty peripheral portion left brachiocephalic fistula.  PRE-OPERATIVE DIAGNOSIS: Complication of dialysis access                                                       End Stage Renal Disease  POST-OPERATIVE DIAGNOSIS: same as above   SURGEON: Katha Cabal, M.D.  ANESTHESIA: Conscious sedation was administered under my direct supervision by the interventional radiology RN. IV Versed plus fentanyl were utilized. Continuous ECG, pulse oximetry and blood pressure was monitored throughout the entire procedure.  Conscious sedation was for a total of 18 minutes 45 seconds.  ESTIMATED BLOOD LOSS: minimal  FINDING(S): Stricture of the AV graft  SPECIMEN(S):  None  CONTRAST: 20 cc  FLUOROSCOPY TIME: 0.8 minutes  INDICATIONS: Leah Davies is a 80 y.o. female who  presents with malfunctioning left AV access.  The patient is scheduled for angiography with possible intervention of the AV access.  The patient is aware the risks include but are not limited to: bleeding, infection, thrombosis of the cannulated access, and possible anaphylactic reaction to the contrast.  The patient acknowledges if the access can not be salvaged a tunneled catheter will be needed and will be placed during this procedure.  The patient is aware of the risks of the procedure and elects to proceed with the angiogram and intervention.  DESCRIPTION: After full informed written consent was obtained, the patient was brought back to the Special Procedure suite and placed supine position.  Appropriate cardiopulmonary monitors were placed.  The left arm was prepped and draped in the standard fashion.  Appropriate timeout is called. The left brachiocephalic fistula was cannulated with a micropuncture needle.  Cannulation was performed with ultrasound guidance. Ultrasound was placed in a sterile sleeve, the AV  access was interrogated and noted to be echolucent and compressible indicating patency. Image was recorded for the permanent record. The puncture is performed under continuous ultrasound visualization.   The microwire was advanced and the needle was exchanged for  a microsheath.  The J-wire was then advanced and a 6 Fr sheath inserted.  Hand injections were completed to image the access from the arterial anastomosis through the entire access.  The central venous structures were also imaged by hand injections.  Based on the images,  3000 units of heparin was given and a wire was negotiated through the strictures within the venous portion of the graft.  An 8 mm x 60 mm [place] balloon was used.  Inflation was to 8 atm for 1 minute.  Follow-up imaging demonstrates complete resolution of the stricture with rapid flow of contrast through the graft, the central venous anatomy is preserved.  A 4-0 Monocryl purse-string suture was sewn around the sheath.  The sheath was removed and light pressure was applied.  A sterile bandage was applied to the puncture site.    COMPLICATIONS: None  CONDITION: Carlynn Purl, M.D  [facility] Office: [phone]  01/02/2021 4:38 PM

## 2021-01-04 ENCOUNTER — Encounter: Payer: Self-pay | Admitting: Vascular Surgery

## 2021-01-16 ENCOUNTER — Other Ambulatory Visit (INDEPENDENT_AMBULATORY_CARE_PROVIDER_SITE_OTHER): Payer: Self-pay | Admitting: Vascular Surgery

## 2021-01-16 DIAGNOSIS — T829XXS Unspecified complication of cardiac and vascular prosthetic device, implant and graft, sequela: Secondary | ICD-10-CM

## 2021-01-16 DIAGNOSIS — N186 End stage renal disease: Secondary | ICD-10-CM

## 2021-01-16 DIAGNOSIS — Z9862 Peripheral vascular angioplasty status: Secondary | ICD-10-CM

## 2021-01-17 ENCOUNTER — Telehealth (INDEPENDENT_AMBULATORY_CARE_PROVIDER_SITE_OTHER): Payer: Self-pay

## 2021-01-17 NOTE — Telephone Encounter (Signed)
Per [name] at [facility] a fax was sent over for the patient to have a permcath removal. Patient is scheduled with Dr. Delana Meyer on 01/23/21 with a 2:15 pm arrival time to the [facility]. Covid testing on 01/19/21 between 8-2 pm at the [facility] [place]. Patient may do covid testing on 4/21 or 4/25 instead due to having dialysis on M/W/F.

## 2021-01-18 ENCOUNTER — Encounter (INDEPENDENT_AMBULATORY_CARE_PROVIDER_SITE_OTHER): Payer: Self-pay | Admitting: Vascular Surgery

## 2021-01-18 ENCOUNTER — Ambulatory Visit (INDEPENDENT_AMBULATORY_CARE_PROVIDER_SITE_OTHER): Payer: Medicare HMO | Admitting: Vascular Surgery

## 2021-01-18 ENCOUNTER — Other Ambulatory Visit: Payer: Self-pay

## 2021-01-18 ENCOUNTER — Ambulatory Visit (INDEPENDENT_AMBULATORY_CARE_PROVIDER_SITE_OTHER): Payer: Medicare HMO

## 2021-01-18 VITALS — BP 172/76 | HR 74 | Ht 60.0 in | Wt 150.0 lb

## 2021-01-18 DIAGNOSIS — I872 Venous insufficiency (chronic) (peripheral): Secondary | ICD-10-CM | POA: Diagnosis not present

## 2021-01-18 DIAGNOSIS — I10 Essential (primary) hypertension: Secondary | ICD-10-CM | POA: Diagnosis not present

## 2021-01-18 DIAGNOSIS — T829XXS Unspecified complication of cardiac and vascular prosthetic device, implant and graft, sequela: Secondary | ICD-10-CM

## 2021-01-18 DIAGNOSIS — N186 End stage renal disease: Secondary | ICD-10-CM

## 2021-01-18 DIAGNOSIS — Z9862 Peripheral vascular angioplasty status: Secondary | ICD-10-CM

## 2021-01-18 NOTE — H&P (View-Only) (Signed)
MRN : [id number]  Leah Davies is a 80 y.o. (January 11, 1941) female who presents with chief complaint of  Chief Complaint  Patient presents with  . Follow-up    2 wk post F/U  fistula   .  History of Present Illness:   The patient returns to the office for followup status post intervention of the dialysis access 01/02/2021: Patient is s/p percutaneous transluminal angioplasty peripheral portion left brachiocephalic fistula.  Following the intervention the access function has significantly improved, with better flow rates and improved KT/V.  Since the intervention they have been cannulating her fistula successfully.  Although this past Monday they did have some difficulty and she has a small hematoma noted.  She is anxious to have her catheter removed.  The patient has not been experiencing increased bleeding times following decannulation and the patient denies increased recirculation. The patient denies an increase in arm swelling. At the present time the patient denies hand pain.  The patient denies amaurosis fugax or recent TIA symptoms. There are no recent neurological changes noted. The patient denies claudication symptoms or rest pain symptoms. The patient denies history of DVT, PE or superficial thrombophlebitis. The patient denies recent episodes of angina or shortness of breath.   Duplex ultrasound of the AV access obtained today demonstrates a flow volume of 1977 with uniform velocities no evidence of recurrence of the stricture that was treated on January 02, 2021   Current Meds  Medication Sig  . acetaminophen (TYLENOL) 500 MG tablet Take 1,000 mg by mouth every 6 (six) hours as needed for moderate pain.   Marland Kitchen alum & mag hydroxide-simeth (MAALOX/MYLANTA) 200-200-20 MG/5ML suspension Take 5 mLs by mouth every 6 (six) hours as needed for indigestion or heartburn.  . bismuth subsalicylate (PEPTO BISMOL) 262 MG/15ML suspension Take 15-30 mLs by mouth every 6 (six) hours as needed for  diarrhea or loose stools or indigestion.  . colchicine 0.6 MG tablet Take 0.6 mg by mouth daily as needed (Gout).  Marland Kitchen gabapentin (NEURONTIN) 100 MG capsule Take 100 mg by mouth every Monday, Wednesday, and Friday with hemodialysis. Before Dialysis  . isosorbide mononitrate (IMDUR) 30 MG 24 hr tablet Take 1 tablet (30 mg total) by mouth daily. (Patient taking differently: Take 30 mg by mouth daily. (1100))  . lidocaine-prilocaine (EMLA) cream Apply 1 application topically daily as needed (applied prior to port being accessed).  . meclizine (ANTIVERT) 25 MG tablet Take 25 mg by mouth 3 (three) times daily as needed for dizziness.  . metoprolol succinate (TOPROL-XL) 25 MG 24 hr tablet Take 1 tablet by mouth daily.  . multivitamin (RENA-VIT) TABS tablet Take 1 tablet by mouth daily.  Marland Kitchen omeprazole (PRILOSEC) 20 MG capsule Take 20 mg by mouth daily as needed (reflux).     Past Medical History:  Diagnosis Date  . Chronic kidney disease    renal insufficiency  . Complication of anesthesia    Vital signs low during colonoscopy had trouble to wake up   . Elevated lipids   . GERD (gastroesophageal reflux disease)   . Gout   . Hypertension     Past Surgical History:  Procedure Laterality Date  . A/V FISTULAGRAM Left 08/22/2020   Procedure: A/V FISTULAGRAM;  Surgeon: Katha Cabal, MD;  Location: [place] [facility];  Service: Cardiovascular;  Laterality: Left;  . A/V FISTULAGRAM Left 01/02/2021   Procedure: A/V FISTULAGRAM;  Surgeon: Katha Cabal, MD;  Location: [place] [facility];  Service: Cardiovascular;  Laterality: Left;  . AV FISTULA PLACEMENT Left 07/28/2020   Procedure: ARTERIOVENOUS (AV) FISTULA CREATION (BRACHIAL CEPHALIC );  Surgeon: Katha Cabal, MD;  Location: [facility];  Service: Vascular;  Laterality: Left;  . BREAST EXCISIONAL BIOPSY Left 2010   benign  . BREAST SURGERY     Lt breast biopsy  . DIALYSIS/PERMA CATHETER INSERTION N/A 04/10/2020   Procedure:  DIALYSIS/PERMA CATHETER INSERTION;  Surgeon: Algernon Huxley, MD;  Location: [place] [facility];  Service: Cardiovascular;  Laterality: N/A;  . KNEE ARTHROSCOPY Left 10/05/2015   Procedure: ARTHROSCOPY KNEE partial medial and lateral meniscectomy, lateral release for sublux patella;  Surgeon: Hessie Knows, MD;  Location: [facility];  Service: Orthopedics;  Laterality: Left;    Social History Social History   Tobacco Use  . Smoking status: Never Smoker  . Smokeless tobacco: Never Used  Vaping Use  . Vaping Use: Never used  Substance Use Topics  . Alcohol use: Not Currently    Comment: rare  . Drug use: No    Family History Family History  Problem Relation Age of Onset  . Breast cancer Sister 8    Allergies  Allergen Reactions  . Amlodipine     Unknown  . Azithromycin     Unknown     REVIEW OF SYSTEMS (Negative unless checked)  Constitutional: '[]'$ Weight loss  '[]'$ Fever  '[]'$ Chills Cardiac: '[]'$ Chest pain   '[]'$ Chest pressure   '[]'$ Palpitations   '[]'$ Shortness of breath when laying flat   '[]'$ Shortness of breath with exertion. Vascular:  '[]'$ Pain in legs with walking   '[]'$ Pain in legs at rest  '[]'$ History of DVT   '[]'$ Phlebitis   '[]'$ Swelling in legs   '[]'$ Varicose veins   '[]'$ Non-healing ulcers Pulmonary:   '[]'$ Uses home oxygen   '[]'$ Productive cough   '[]'$ Hemoptysis   '[]'$ Wheeze  '[]'$ COPD   '[]'$ Asthma Neurologic:  '[]'$ Dizziness   '[]'$ Seizures   '[]'$ History of stroke   '[]'$ History of TIA  '[]'$ Aphasia   '[]'$ Vissual changes   '[]'$ Weakness or numbness in arm   '[]'$ Weakness or numbness in leg Musculoskeletal:   '[]'$ Joint swelling   '[]'$ Joint pain   '[]'$ Low back pain Hematologic:  '[]'$ Easy bruising  '[]'$ Easy bleeding   '[]'$ Hypercoagulable state   '[]'$ Anemic Gastrointestinal:  '[]'$ Diarrhea   '[]'$ Vomiting  '[]'$ Gastroesophageal reflux/heartburn   '[]'$ Difficulty swallowing. Genitourinary:  '[x]'$ Chronic kidney disease   '[]'$ Difficult urination  '[]'$ Frequent urination   '[]'$ Blood in urine Skin:  '[]'$ Rashes   '[]'$ Ulcers  Psychological:  '[]'$ History of anxiety   '[]'$  History of  major depression.  Physical Examination  Vitals:   01/18/21 0838  BP: (!) 172/76  Pulse: 74  Weight: 150 lb (68 kg)  Height: 5' (1.524 m)   Body mass index is 29.29 kg/m. Gen: WD/WN, NAD Head: [place]/AT, No temporalis wasting.  Ear/Nose/Throat: Hearing grossly intact, nares w/o erythema or drainage Eyes: PER, EOMI, sclera nonicteric.  Neck: Supple, no large masses.   Pulmonary:  Good air movement, no audible wheezing bilaterally, no use of accessory muscles.  Cardiac: RRR, no JVD Vascular: Left upper arm access good thrill good bruit small amount of ecchymoses Vessel Right Left  Radial Palpable Palpable  Brachial Palpable Palpable  Gastrointestinal: Non-distended. No guarding/no peritoneal signs.  Musculoskeletal: M/S 5/5 throughout.  No deformity or atrophy.  Neurologic: CN 2-12 intact. Symmetrical.  Speech is fluent. Motor exam as listed above. Psychiatric: Judgment intact, Mood & affect appropriate for pt's clinical situation. Dermatologic: No rashes or ulcers noted.  No changes consistent with cellulitis.   CBC Lab Results  Component Value Date   WBC 7.5 07/25/2020   HGB 11.9 (L) 07/28/2020   HCT 35.0 (L) 07/28/2020   MCV 89.3 07/25/2020   PLT 162 07/25/2020    BMET    Component Value Date/Time   NA 133 (L) 07/28/2020 0906   K 4.4 07/28/2020 0906   CL 110 07/28/2020 0906   CO2 25 07/25/2020 0951   GLUCOSE 85 07/28/2020 0906   BUN 48 (H) 07/28/2020 0906   CREATININE 5.50 (H) 07/28/2020 0906   CALCIUM 8.6 (L) 07/25/2020 0951   GFRNONAA 11 (L) 07/25/2020 0951   GFRAA 8 (L) 06/26/2020 1925   CrCl cannot be calculated (Patient's most recent lab result is older than the maximum 21 days allowed.).  COAG Lab Results  Component Value Date   INR 1.0 07/25/2020    Radiology PERIPHERAL VASCULAR CATHETERIZATION  Result Date: 01/02/2021 See Op Note    Assessment/Plan 1. Complication from renal dialysis device, sequela Recommend:  The patient is doing well  and currently has adequate dialysis access. The patient's dialysis center is not reporting any access issues. Flow pattern is stable when compared to the prior ultrasound.  The patient should have a duplex ultrasound of the dialysis access in 3 months given her recent intervention.  The patient will follow-up with me in the office after each ultrasound    - VAS [place] (AVF, AVG); Future  2. ESRD (end stage renal disease) ([place]) At the present time the patient has adequate dialysis access.  Continue hemodialysis as ordered without interruption.  Avoid nephrotoxic medications and dehydration.  Further plans per nephrology  3. Essential hypertension Continue antihypertensive medications as already ordered, these medications have been reviewed and there are no changes at this time.   4. Chronic venous insufficiency No surgery or intervention at this point in time.    I have had a long discussion with the patient regarding venous insufficiency and why it  causes symptoms. I have discussed with the patient the chronic skin changes that accompany venous insufficiency and the long term sequela such as infection and ulceration.  Patient will begin wearing graduated compression stockings class 1 (20-30 mmHg) or compression wraps on a daily basis a prescription was given. The patient will put the stockings on first thing in the morning and removing them in the evening. The patient is instructed specifically not to sleep in the stockings.    In addition, behavioral modification including several periods of elevation of the lower extremities during the day will be continued. I have demonstrated that proper elevation is a position with the ankles at heart level.  The patient is instructed to begin routine exercise, especially walking on a daily basis    Hortencia Pilar, MD  01/18/2021 9:15 AM

## 2021-01-18 NOTE — Progress Notes (Signed)
MRN : [id number]  Leah Davies is a 80 y.o. ([birth year]) female who presents with chief complaint of  Chief Complaint  Patient presents with  . Follow-up    2 wk post F/U  fistula   .  History of Present Illness:   The patient returns to the office for followup status post intervention of the dialysis access 01/02/2021: Patient is s/p percutaneous transluminal angioplasty peripheral portion left brachiocephalic fistula.  Following the intervention the access function has significantly improved, with better flow rates and improved KT/V.  Since the intervention they have been cannulating her fistula successfully.  Although this past Monday they did have some difficulty and she has a small hematoma noted.  She is anxious to have her catheter removed.  The patient has not been experiencing increased bleeding times following decannulation and the patient denies increased recirculation. The patient denies an increase in arm swelling. At the present time the patient denies hand pain.  The patient denies amaurosis fugax or recent TIA symptoms. There are no recent neurological changes noted. The patient denies claudication symptoms or rest pain symptoms. The patient denies history of DVT, PE or superficial thrombophlebitis. The patient denies recent episodes of angina or shortness of breath.   Duplex ultrasound of the AV access obtained today demonstrates a flow volume of 1977 with uniform velocities no evidence of recurrence of the stricture that was treated on January 02, 2021   Current Meds  Medication Sig  . acetaminophen (TYLENOL) 500 MG tablet Take 1,000 mg by mouth every 6 (six) hours as needed for moderate pain.   Marland Kitchen alum & mag hydroxide-simeth (MAALOX/MYLANTA) 200-200-20 MG/5ML suspension Take 5 mLs by mouth every 6 (six) hours as needed for indigestion or heartburn.  . bismuth subsalicylate (PEPTO BISMOL) 262 MG/15ML suspension Take 15-30 mLs by mouth every 6 (six) hours as needed for  diarrhea or loose stools or indigestion.  . colchicine 0.6 MG tablet Take 0.6 mg by mouth daily as needed (Gout).  Marland Kitchen gabapentin (NEURONTIN) 100 MG capsule Take 100 mg by mouth every Monday, Wednesday, and Friday with hemodialysis. Before Dialysis  . isosorbide mononitrate (IMDUR) 30 MG 24 hr tablet Take 1 tablet (30 mg total) by mouth daily. (Patient taking differently: Take 30 mg by mouth daily. (1100))  . lidocaine-prilocaine (EMLA) cream Apply 1 application topically daily as needed (applied prior to port being accessed).  . meclizine (ANTIVERT) 25 MG tablet Take 25 mg by mouth 3 (three) times daily as needed for dizziness.  . metoprolol succinate (TOPROL-XL) 25 MG 24 hr tablet Take 1 tablet by mouth daily.  . multivitamin (RENA-VIT) TABS tablet Take 1 tablet by mouth daily.  Marland Kitchen omeprazole (PRILOSEC) 20 MG capsule Take 20 mg by mouth daily as needed (reflux).     Past Medical History:  Diagnosis Date  . Chronic kidney disease    renal insufficiency  . Complication of anesthesia    Vital signs low during colonoscopy had trouble to wake up   . Elevated lipids   . GERD (gastroesophageal reflux disease)   . Gout   . Hypertension     Past Surgical History:  Procedure Laterality Date  . A/V FISTULAGRAM Left 08/22/2020   Procedure: A/V FISTULAGRAM;  Surgeon: Katha Cabal, MD;  Location: [facility];  Service: Cardiovascular;  Laterality: Left;  . A/V FISTULAGRAM Left 01/02/2021   Procedure: A/V FISTULAGRAM;  Surgeon: Katha Cabal, MD;  Location: [facility];  Service: Cardiovascular;  Laterality: Left;  . AV FISTULA PLACEMENT Left 07/28/2020   Procedure: ARTERIOVENOUS (AV) FISTULA CREATION (BRACHIAL CEPHALIC );  Surgeon: Katha Cabal, MD;  Location: [facility];  Service: Vascular;  Laterality: Left;  . BREAST EXCISIONAL BIOPSY Left 2010   benign  . BREAST SURGERY     Lt breast biopsy  . DIALYSIS/PERMA CATHETER INSERTION N/A 04/10/2020   Procedure:  DIALYSIS/PERMA CATHETER INSERTION;  Surgeon: Algernon Huxley, MD;  Location: [facility];  Service: Cardiovascular;  Laterality: N/A;  . KNEE ARTHROSCOPY Left 10/05/2015   Procedure: ARTHROSCOPY KNEE partial medial and lateral meniscectomy, lateral release for sublux patella;  Surgeon: Hessie Knows, MD;  Location: [facility];  Service: Orthopedics;  Laterality: Left;    Social History Social History   Tobacco Use  . Smoking status: Never Smoker  . Smokeless tobacco: Never Used  Vaping Use  . Vaping Use: Never used  Substance Use Topics  . Alcohol use: Not Currently    Comment: rare  . Drug use: No    Family History Family History  Problem Relation Age of Onset  . Breast cancer Sister 31    Allergies  Allergen Reactions  . Amlodipine     Unknown  . Azithromycin     Unknown     REVIEW OF SYSTEMS (Negative unless checked)  Constitutional: '[]'$ Weight loss  '[]'$ Fever  '[]'$ Chills Cardiac: '[]'$ Chest pain   '[]'$ Chest pressure   '[]'$ Palpitations   '[]'$ Shortness of breath when laying flat   '[]'$ Shortness of breath with exertion. Vascular:  '[]'$ Pain in legs with walking   '[]'$ Pain in legs at rest  '[]'$ History of DVT   '[]'$ Phlebitis   '[]'$ Swelling in legs   '[]'$ Varicose veins   '[]'$ Non-healing ulcers Pulmonary:   '[]'$ Uses home oxygen   '[]'$ Productive cough   '[]'$ Hemoptysis   '[]'$ Wheeze  '[]'$ COPD   '[]'$ Asthma Neurologic:  '[]'$ Dizziness   '[]'$ Seizures   '[]'$ History of stroke   '[]'$ History of TIA  '[]'$ Aphasia   '[]'$ Vissual changes   '[]'$ Weakness or numbness in arm   '[]'$ Weakness or numbness in leg Musculoskeletal:   '[]'$ Joint swelling   '[]'$ Joint pain   '[]'$ Low back pain Hematologic:  '[]'$ Easy bruising  '[]'$ Easy bleeding   '[]'$ Hypercoagulable state   '[]'$ Anemic Gastrointestinal:  '[]'$ Diarrhea   '[]'$ Vomiting  '[]'$ Gastroesophageal reflux/heartburn   '[]'$ Difficulty swallowing. Genitourinary:  '[x]'$ Chronic kidney disease   '[]'$ Difficult urination  '[]'$ Frequent urination   '[]'$ Blood in urine Skin:  '[]'$ Rashes   '[]'$ Ulcers  Psychological:  '[]'$ History of anxiety   '[]'$  History of  major depression.  Physical Examination  Vitals:   01/18/21 0838  BP: (!) 172/76  Pulse: 74  Weight: 150 lb (68 kg)  Height: 5' (1.524 m)   Body mass index is 29.29 kg/m. Gen: WD/WN, NAD Head: B and E/AT, No temporalis wasting.  Ear/Nose/Throat: Hearing grossly intact, nares w/o erythema or drainage Eyes: PER, EOMI, sclera nonicteric.  Neck: Supple, no large masses.   Pulmonary:  Good air movement, no audible wheezing bilaterally, no use of accessory muscles.  Cardiac: RRR, no JVD Vascular: Left upper arm access good thrill good bruit small amount of ecchymoses Vessel Right Left  Radial Palpable Palpable  Brachial Palpable Palpable  Gastrointestinal: Non-distended. No guarding/no peritoneal signs.  Musculoskeletal: M/S 5/5 throughout.  No deformity or atrophy.  Neurologic: CN 2-12 intact. Symmetrical.  Speech is fluent. Motor exam as listed above. Psychiatric: Judgment intact, Mood & affect appropriate for pt's clinical situation. Dermatologic: No rashes or ulcers noted.  No changes consistent with cellulitis.   CBC Lab Results  Component Value Date   WBC 7.5 07/25/2020   HGB 11.9 (L) 07/28/2020   HCT 35.0 (L) 07/28/2020   MCV 89.3 07/25/2020   PLT 162 07/25/2020    BMET    Component Value Date/Time   NA 133 (L) 07/28/2020 0906   K 4.4 07/28/2020 0906   CL 110 07/28/2020 0906   CO2 25 07/25/2020 0951   GLUCOSE 85 07/28/2020 0906   BUN 48 (H) 07/28/2020 0906   CREATININE 5.50 (H) 07/28/2020 0906   CALCIUM 8.6 (L) 07/25/2020 0951   GFRNONAA 11 (L) 07/25/2020 0951   GFRAA 8 (L) 06/26/2020 1925   CrCl cannot be calculated (Patient's most recent lab result is older than the maximum 21 days allowed.).  COAG Lab Results  Component Value Date   INR 1.0 07/25/2020    Radiology PERIPHERAL VASCULAR CATHETERIZATION  Result Date: 01/02/2021 See Op Note    Assessment/Plan 1. Complication from renal dialysis device, sequela Recommend:  The patient is doing well  and currently has adequate dialysis access. The patient's dialysis center is not reporting any access issues. Flow pattern is stable when compared to the prior ultrasound.  The patient should have a duplex ultrasound of the dialysis access in 3 months given her recent intervention.  The patient will follow-up with me in the office after each ultrasound    - VAS [place] (AVF, AVG); Future  2. ESRD (end stage renal disease) ([place]) At the present time the patient has adequate dialysis access.  Continue hemodialysis as ordered without interruption.  Avoid nephrotoxic medications and dehydration.  Further plans per nephrology  3. Essential hypertension Continue antihypertensive medications as already ordered, these medications have been reviewed and there are no changes at this time.   4. Chronic venous insufficiency No surgery or intervention at this point in time.    I have had a long discussion with the patient regarding venous insufficiency and why it  causes symptoms. I have discussed with the patient the chronic skin changes that accompany venous insufficiency and the long term sequela such as infection and ulceration.  Patient will begin wearing graduated compression stockings class 1 (20-30 mmHg) or compression wraps on a daily basis a prescription was given. The patient will put the stockings on first thing in the morning and removing them in the evening. The patient is instructed specifically not to sleep in the stockings.    In addition, behavioral modification including several periods of elevation of the lower extremities during the day will be continued. I have demonstrated that proper elevation is a position with the ankles at heart level.  The patient is instructed to begin routine exercise, especially walking on a daily basis    Hortencia Pilar, MD  01/18/2021 9:15 AM

## 2021-01-23 ENCOUNTER — Other Ambulatory Visit (INDEPENDENT_AMBULATORY_CARE_PROVIDER_SITE_OTHER): Payer: Self-pay | Admitting: Nurse Practitioner

## 2021-01-23 DIAGNOSIS — N186 End stage renal disease: Secondary | ICD-10-CM

## 2021-01-26 ENCOUNTER — Other Ambulatory Visit
Admission: RE | Admit: 2021-01-26 | Discharge: 2021-01-26 | Disposition: A | Discharge status: Home / Self Care | Payer: Medicare HMO | Source: Ambulatory Visit | Attending: Vascular Surgery | Admitting: Vascular Surgery

## 2021-01-26 ENCOUNTER — Other Ambulatory Visit: Payer: Self-pay

## 2021-01-26 DIAGNOSIS — Z20822 Contact with and (suspected) exposure to covid-19: Secondary | ICD-10-CM | POA: Insufficient documentation

## 2021-01-26 DIAGNOSIS — Z01812 Encounter for preprocedural laboratory examination: Secondary | ICD-10-CM | POA: Insufficient documentation

## 2021-01-27 LAB — SARS CORONAVIRUS 2 (TAT 6-24 HRS): SARS Coronavirus 2: NEGATIVE

## 2021-01-30 ENCOUNTER — Encounter
Admission: RE | Disposition: A | Discharge status: Home / Self Care | Payer: Self-pay | Source: Home / Self Care | Attending: Vascular Surgery

## 2021-01-30 ENCOUNTER — Ambulatory Visit
Admission: RE | Admit: 2021-01-30 | Discharge: 2021-01-30 | Disposition: A | Discharge status: Home / Self Care | Payer: Medicare HMO | Attending: Vascular Surgery | Admitting: Vascular Surgery

## 2021-01-30 ENCOUNTER — Other Ambulatory Visit (INDEPENDENT_AMBULATORY_CARE_PROVIDER_SITE_OTHER): Payer: Self-pay | Admitting: Nurse Practitioner

## 2021-01-30 DIAGNOSIS — Z79899 Other long term (current) drug therapy: Secondary | ICD-10-CM | POA: Diagnosis not present

## 2021-01-30 DIAGNOSIS — I120 Hypertensive chronic kidney disease with stage 5 chronic kidney disease or end stage renal disease: Secondary | ICD-10-CM | POA: Insufficient documentation

## 2021-01-30 DIAGNOSIS — Z4901 Encounter for fitting and adjustment of extracorporeal dialysis catheter: Secondary | ICD-10-CM | POA: Diagnosis present

## 2021-01-30 DIAGNOSIS — Z881 Allergy status to other antibiotic agents status: Secondary | ICD-10-CM | POA: Diagnosis not present

## 2021-01-30 DIAGNOSIS — N186 End stage renal disease: Secondary | ICD-10-CM | POA: Insufficient documentation

## 2021-01-30 DIAGNOSIS — Z888 Allergy status to other drugs, medicaments and biological substances status: Secondary | ICD-10-CM | POA: Insufficient documentation

## 2021-01-30 DIAGNOSIS — N185 Chronic kidney disease, stage 5: Secondary | ICD-10-CM | POA: Diagnosis not present

## 2021-01-30 DIAGNOSIS — I739 Peripheral vascular disease, unspecified: Secondary | ICD-10-CM | POA: Insufficient documentation

## 2021-01-30 HISTORY — PX: DIALYSIS/PERMA CATHETER REMOVAL: CATH118289

## 2021-01-30 SURGERY — DIALYSIS/PERMA CATHETER REMOVAL
Anesthesia: LOCAL

## 2021-01-30 MED ORDER — LIDOCAINE HCL (PF) 1 % IJ SOLN
INTRAMUSCULAR | Status: DC | PRN
  Administered 2021-01-30: 15 mL

## 2021-01-30 SURGICAL SUPPLY — 2 items
FORCEPS HALSTEAD CVD 5IN STRL (INSTRUMENTS) ×2 IMPLANT
TRAY LACERAT/PLASTIC (MISCELLANEOUS) ×2 IMPLANT

## 2021-01-30 NOTE — Interval H&P Note (Signed)
History and Physical Interval Note:  01/30/2021 3:30 PM  Leah Davies  has presented today for surgery, with the diagnosis of Perm Cath Removal   ESRD   Covid test 4-22.  The various methods of treatment have been discussed with the patient and family. After consideration of risks, benefits and other options for treatment, the patient has consented to:   Procedure(s): DIALYSIS/PERMA CATHETER REMOVAL (N/A) as a surgical intervention.  The patient's history has been reviewed, patient examined, no change in status, stable for surgery.  I have reviewed the patient's chart and labs.  Questions were answered to the patient's satisfaction.    [name]

## 2021-01-30 NOTE — Op Note (Signed)
Operative Note   Preoperative diagnosis:    1. ESRD with functional permanent access  Postoperative diagnosis:   1. ESRD with functional permanent access  Procedure:  Removal of RIGHT Permcath  Physician Assistant: Hezzie Bump PA-C  Surgeon:  Delana Meyer, MD  Anesthesia:  Local  EBL:  Minimal  Indication for the Procedure:  The patient has a functional permanent dialysis access and no longer needs their permcath.  This can be removed.  Risks and benefits are discussed and informed consent is obtained.  Description of the Procedure:  The patient's right neck, chest and existing catheter were sterilely prepped and draped. The area around the catheter was anesthetized copiously with 1% lidocaine. The catheter was dissected out with curved hemostats until the cuff was freed from the surrounding fibrous sheath. The fiber sheath was transected, and the catheter was then removed in its entirety using gentle traction. Pressure was held and sterile dressings were placed. The patient tolerated the procedure well and was taken to the recovery room in stable condition.  Leah Davies  01/30/2021, 3:32 PM  This note was created with Dragon Medical transcription system. Any errors in dictation are purely unintentional.

## 2021-01-30 NOTE — Discharge Instructions (Signed)
Tunneled Catheter Removal, Care After Refer to this sheet in the next few weeks. These instructions provide you with information about caring for yourself after your procedure. Your health care provider may also give you more specific instructions. Your treatment has been planned according to current medical practices, but problems sometimes occur. Call your health care provider if you have any problems or questions after your procedure. What can I expect after the procedure? After the procedure, it is common to have: Some mild redness, swelling, and pain around your catheter site.   Follow these instructions at home: Incision care  Check your removal site  every day for signs of infection. Check for: More redness, swelling, or pain. More fluid or blood. Warmth. Pus or a bad smell. Remove your dressing in 48hrs leave open to air  Activity  Return to your normal activities as told by your health care provider. Ask your health care provider what activities are safe for you. Do not lift anything that is heavier than 10 lb (4.5 kg) for 3 days  You may shower tomorrow  Contact a health care provider if: You have more fluid or blood coming from your removal site You have more redness, swelling, or pain at your incisions or around the area where your catheter was removed Your removal site feel warm to the touch. You feel unusually weak. You feel nauseous.. Get help right away if You have swelling in your arm, shoulder, neck, or face. You develop chest pain. You have difficulty breathing. You feel dizzy or light-headed. You have pus or a bad smell coming from your removal site You have a fever. You develop bleeding from your removal site, and your bleeding does not stop. This information is not intended to replace advice given to you by your health care provider. Make sure you discuss any questions you have with your health care provider. Document Released: 09/02/2012 Document Revised:  05/19/2016 Document Reviewed: 06/12/2015 Elsevier Interactive Patient Education  2017 [employer]vier Inc[employer] 

## 2021-01-31 ENCOUNTER — Encounter: Payer: Self-pay | Admitting: Vascular Surgery

## 2021-04-10 ENCOUNTER — Other Ambulatory Visit: Payer: Self-pay

## 2021-04-10 ENCOUNTER — Observation Stay
Admission: EM | Admit: 2021-04-10 | Discharge: 2021-04-12 | Disposition: A | Discharge status: Home / Self Care | Payer: Medicare HMO | Attending: Internal Medicine | Admitting: Internal Medicine

## 2021-04-10 ENCOUNTER — Observation Stay: Admit: 2021-04-10 | Payer: Medicare HMO

## 2021-04-10 ENCOUNTER — Observation Stay
Admit: 2021-04-10 | Discharge: 2021-04-10 | Disposition: A | Discharge status: Home / Self Care | Payer: Medicare HMO | Attending: Internal Medicine | Admitting: Internal Medicine

## 2021-04-10 ENCOUNTER — Emergency Department: Payer: Medicare HMO

## 2021-04-10 DIAGNOSIS — I10 Essential (primary) hypertension: Secondary | ICD-10-CM | POA: Diagnosis present

## 2021-04-10 DIAGNOSIS — Z20822 Contact with and (suspected) exposure to covid-19: Secondary | ICD-10-CM | POA: Diagnosis not present

## 2021-04-10 DIAGNOSIS — R0789 Other chest pain: Secondary | ICD-10-CM | POA: Diagnosis present

## 2021-04-10 DIAGNOSIS — T82590A Other mechanical complication of surgically created arteriovenous fistula, initial encounter: Secondary | ICD-10-CM | POA: Diagnosis not present

## 2021-04-10 DIAGNOSIS — K219 Gastro-esophageal reflux disease without esophagitis: Secondary | ICD-10-CM | POA: Diagnosis present

## 2021-04-10 DIAGNOSIS — Z992 Dependence on renal dialysis: Secondary | ICD-10-CM | POA: Insufficient documentation

## 2021-04-10 DIAGNOSIS — R778 Other specified abnormalities of plasma proteins: Secondary | ICD-10-CM | POA: Diagnosis not present

## 2021-04-10 DIAGNOSIS — Z79899 Other long term (current) drug therapy: Secondary | ICD-10-CM | POA: Diagnosis not present

## 2021-04-10 DIAGNOSIS — I120 Hypertensive chronic kidney disease with stage 5 chronic kidney disease or end stage renal disease: Secondary | ICD-10-CM | POA: Insufficient documentation

## 2021-04-10 DIAGNOSIS — R072 Precordial pain: Secondary | ICD-10-CM | POA: Diagnosis not present

## 2021-04-10 DIAGNOSIS — N186 End stage renal disease: Secondary | ICD-10-CM | POA: Diagnosis present

## 2021-04-10 DIAGNOSIS — R079 Chest pain, unspecified: Secondary | ICD-10-CM | POA: Diagnosis present

## 2021-04-10 DIAGNOSIS — E669 Obesity, unspecified: Secondary | ICD-10-CM | POA: Diagnosis present

## 2021-04-10 LAB — COMPREHENSIVE METABOLIC PANEL
ALT: 24 U/L (ref 0–44)
AST: 30 U/L (ref 15–41)
Albumin: 3.8 g/dL (ref 3.5–5.0)
Alkaline Phosphatase: 66 U/L (ref 38–126)
Anion gap: 11 (ref 5–15)
BUN: 80 mg/dL — ABNORMAL HIGH (ref 8–23)
CO2: 21 mmol/L — ABNORMAL LOW (ref 22–32)
Calcium: 9.2 mg/dL (ref 8.9–10.3)
Chloride: 106 mmol/L (ref 98–111)
Creatinine, Ser: 7.13 mg/dL — ABNORMAL HIGH (ref 0.44–1.00)
GFR, Estimated: 5 mL/min — ABNORMAL LOW (ref >60)
Glucose, Bld: 108 mg/dL — ABNORMAL HIGH (ref 70–99)
Potassium: 5.3 mmol/L — ABNORMAL HIGH (ref 3.5–5.1)
Sodium: 138 mmol/L (ref 135–145)
Total Bilirubin: 1 mg/dL (ref 0.3–1.2)
Total Protein: 6.9 g/dL (ref 6.5–8.1)

## 2021-04-10 LAB — TROPONIN I (HIGH SENSITIVITY)
Troponin I (High Sensitivity): 222 ng/L (ref <18)
Troponin I (High Sensitivity): 231 ng/L (ref <18)
Troponin I (High Sensitivity): 265 ng/L (ref <18)
Troponin I (High Sensitivity): 279 ng/L (ref <18)

## 2021-04-10 LAB — CBC
HCT: 30.5 % — ABNORMAL LOW (ref 36.0–46.0)
Hemoglobin: 10.2 g/dL — ABNORMAL LOW (ref 12.0–15.0)
MCH: 31.8 pg (ref 26.0–34.0)
MCHC: 33.4 g/dL (ref 30.0–36.0)
MCV: 95 fL (ref 80.0–100.0)
Platelets: 187 10*3/uL (ref 150–400)
RBC: 3.21 MIL/uL — ABNORMAL LOW (ref 3.87–5.11)
RDW: 12.1 % (ref 11.5–15.5)
WBC: 6.6 10*3/uL (ref 4.0–10.5)
nRBC: 0 % (ref 0.0–0.2)

## 2021-04-10 LAB — RESP PANEL BY RT-PCR (FLU A&B, COVID) ARPGX2
Influenza A by PCR: NEGATIVE
Influenza B by PCR: NEGATIVE
SARS Coronavirus 2 by RT PCR: NEGATIVE

## 2021-04-10 LAB — LIPASE, BLOOD: Lipase: 63 U/L — ABNORMAL HIGH (ref 11–51)

## 2021-04-10 MED ORDER — PANTOPRAZOLE SODIUM 40 MG PO TBEC
40.0000 mg | DELAYED_RELEASE_TABLET | Freq: Every day | ORAL | Status: DC
  Administered 2021-04-10 – 2021-04-12 (×3): 40 mg via ORAL
  Filled 2021-04-10 (×3): qty 1

## 2021-04-10 MED ORDER — PANTOPRAZOLE SODIUM 40 MG PO TBEC: 40.0000 mg | DELAYED_RELEASE_TABLET | Freq: Every day | ORAL | Status: DC | PRN

## 2021-04-10 MED ORDER — ASPIRIN EC 81 MG PO TBEC
81.0000 mg | DELAYED_RELEASE_TABLET | Freq: Every day | ORAL | Status: DC
  Administered 2021-04-11 – 2021-04-12 (×2): 81 mg via ORAL
  Filled 2021-04-10 (×2): qty 1

## 2021-04-10 MED ORDER — ATORVASTATIN CALCIUM 20 MG PO TABS
40.0000 mg | ORAL_TABLET | Freq: Every day | ORAL | Status: DC
  Administered 2021-04-10 – 2021-04-12 (×3): 40 mg via ORAL
  Filled 2021-04-10 (×3): qty 2

## 2021-04-10 MED ORDER — METOPROLOL SUCCINATE ER 25 MG PO TB24
25.0000 mg | ORAL_TABLET | Freq: Every day | ORAL | Status: DC
  Administered 2021-04-11 – 2021-04-12 (×2): 25 mg via ORAL
  Filled 2021-04-10 (×2): qty 1

## 2021-04-10 MED ORDER — LOSARTAN POTASSIUM 50 MG PO TABS: 50.0000 mg | ORAL_TABLET | Freq: Every day | ORAL | Status: DC

## 2021-04-10 MED ORDER — HYDRALAZINE HCL 25 MG PO TABS
25.0000 mg | ORAL_TABLET | Freq: Two times a day (BID) | ORAL | Status: DC
  Administered 2021-04-10 – 2021-04-12 (×3): 25 mg via ORAL
  Filled 2021-04-10 (×3): qty 1

## 2021-04-10 MED ORDER — HEPARIN SODIUM (PORCINE) 5000 UNIT/ML IJ SOLN
5000.0000 [IU] | Freq: Three times a day (TID) | INTRAMUSCULAR | Status: DC
  Administered 2021-04-10 – 2021-04-12 (×5): 5000 [IU] via SUBCUTANEOUS
  Filled 2021-04-10 (×5): qty 1

## 2021-04-10 MED ORDER — ACETAMINOPHEN 325 MG PO TABS: 650.0000 mg | ORAL_TABLET | ORAL | Status: DC | PRN

## 2021-04-10 MED ORDER — CHLORHEXIDINE GLUCONATE CLOTH 2 % EX PADS
6.0000 | MEDICATED_PAD | Freq: Every day | CUTANEOUS | Status: DC
  Administered 2021-04-11 – 2021-04-12 (×2): 6 via TOPICAL

## 2021-04-10 MED ORDER — ONDANSETRON HCL 4 MG/2ML IJ SOLN: 4.0000 mg | Freq: Four times a day (QID) | INTRAMUSCULAR | Status: DC | PRN

## 2021-04-10 MED ORDER — ASPIRIN 81 MG PO CHEW
324.0000 mg | CHEWABLE_TABLET | Freq: Once | ORAL | Status: AC
  Administered 2021-04-10: 324 mg via ORAL
  Filled 2021-04-10: qty 4

## 2021-04-10 MED ORDER — ISOSORBIDE MONONITRATE ER 30 MG PO TB24
30.0000 mg | ORAL_TABLET | Freq: Every day | ORAL | Status: DC
  Administered 2021-04-11 – 2021-04-12 (×2): 30 mg via ORAL
  Filled 2021-04-10 (×2): qty 1

## 2021-04-10 MED ORDER — ENOXAPARIN SODIUM 30 MG/0.3ML IJ SOSY: 30.0000 mg | PREFILLED_SYRINGE | INTRAMUSCULAR | Status: DC

## 2021-04-10 NOTE — ED Provider Notes (Signed)
[facility] Emergency Department Provider Note  Time seen: 9:51 AM  I have reviewed the triage vital signs and the nursing notes.   HISTORY  Chief Complaint Abdominal Pain and Chest Pain   HPI Leah Davies is a 80 y.o. female with a past medical history of CKD, gastric reflux, hypertension, presents to the emergency department for chest pain.  According to the patient and her daughter over the past 2 weeks the patient has been experiencing pain which she describes more as a burning sensation in the epigastrium that then goes up into the chest.  Daughter states the patient has a history of peptic ulcers long-ago is supposed to be taking omeprazole each day but she has not been doing so.  Daughter believes this is likely heartburn/reflux but wanted to make sure there was not something more concerning like a heart attack so she came to the emergency department.  Patient denies any discomfort at this time.   Past Medical History:  Diagnosis Date   Chronic kidney disease    renal insufficiency   Complication of anesthesia    Vital signs low during colonoscopy had trouble to wake up    Elevated lipids    GERD (gastroesophageal reflux disease)    Gout    Hypertension     Patient Active Problem List   Diagnosis Date Noted   Complication from renal dialysis device 09/13/2020   Constipation    ESRD (end stage renal disease) ([place]) 04/10/2020   Nonintractable headache    Metabolic acidosis [id number]   Hypertensive urgency 04/09/2020   AKI (acute kidney injury) ([place]) 04/09/2020   Acute renal failure superimposed on stage 5 chronic kidney disease, not on chronic dialysis ([place]) 02/23/2020   Uncontrolled hypertension 02/23/2020   Intractable vomiting with nausea 02/19/2020   Hyperkalemia 02/19/2020   Acute lower UTI 02/19/2020   Essential hypertension 02/19/2020   GERD (gastroesophageal reflux disease) 02/19/2020   Anemia of chronic disease 02/19/2020    Hypocalcemia 02/19/2020   Varicose veins of both lower extremities with inflammation 11/18/2016   Pain in limb 11/18/2016   Chronic venous insufficiency 11/18/2016   Swelling of limb 11/18/2016   Abdominal bloating 06/26/2016   Perioral dermatitis 06/21/2015   Other dorsalgia 04/06/2015   Age-related osteoporosis without current pathological fracture 03/23/2015   Vertigo 12/21/2013   Sebaceous cyst 12/01/2013   Secondary hyperparathyroidism of renal origin ([place]) 07/27/2012   Vitamin D deficiency 05/19/2012   Chronic kidney disease, stage IV (severe) ([place]) 01/22/2012   Gout 10/23/2011   Diverticulosis of intestine, part unspecified, without perforation or abscess without bleeding 12/03/2010   Hemorrhoids 01/04/2010   Disequilibrium 07/04/2009   Breast lump 02/09/2009   Dermatitis 06/14/2008   Allergic rhinitis 03/08/2008   Hyperlipidemia 03/08/2008   Personal history of other diseases of the digestive system 03/08/2008   Gastro-esophageal reflux disease without esophagitis 03/08/2008    Past Surgical History:  Procedure Laterality Date   A/V FISTULAGRAM Left 08/22/2020   Procedure: A/V FISTULAGRAM;  Surgeon: Katha Cabal, MD;  Location: [facility];  Service: Cardiovascular;  Laterality: Left;   A/V FISTULAGRAM Left 01/02/2021   Procedure: A/V FISTULAGRAM;  Surgeon: Katha Cabal, MD;  Location: [facility];  Service: Cardiovascular;  Laterality: Left;   AV FISTULA PLACEMENT Left 07/28/2020   Procedure: ARTERIOVENOUS (AV) FISTULA CREATION (BRACHIAL CEPHALIC );  Surgeon: Katha Cabal, MD;  Location: [facility];  Service: Vascular;  Laterality: Left;   BREAST EXCISIONAL BIOPSY  Left 2010   benign   BREAST SURGERY     Lt breast biopsy   DIALYSIS/PERMA CATHETER INSERTION N/A 04/10/2020   Procedure: DIALYSIS/PERMA CATHETER INSERTION;  Surgeon: Algernon Huxley, MD;  Location: [facility];  Service: Cardiovascular;  Laterality: N/A;   DIALYSIS/PERMA  CATHETER REMOVAL N/A 01/30/2021   Procedure: DIALYSIS/PERMA CATHETER REMOVAL;  Surgeon: Katha Cabal, MD;  Location: [facility];  Service: Cardiovascular;  Laterality: N/A;   KNEE ARTHROSCOPY Left 10/05/2015   Procedure: ARTHROSCOPY KNEE partial medial and lateral meniscectomy, lateral release for sublux patella;  Surgeon: Hessie Knows, MD;  Location: [facility];  Service: Orthopedics;  Laterality: Left;    Prior to Admission medications   Medication Sig Start Date End Date Taking? Authorizing Provider  acetaminophen (TYLENOL) 500 MG tablet Take 1,000 mg by mouth every 6 (six) hours as needed for moderate pain.     [provider]  alum & mag hydroxide-simeth (MAALOX/MYLANTA) 200-200-20 MG/5ML suspension Take 5 mLs by mouth every 6 (six) hours as needed for indigestion or heartburn.    [provider]  bismuth subsalicylate (PEPTO BISMOL) 262 MG/15ML suspension Take 15-30 mLs by mouth every 6 (six) hours as needed for diarrhea or loose stools or indigestion.    [provider]  colchicine 0.6 MG tablet Take 0.6 mg by mouth daily as needed (Gout).    [provider]  gabapentin (NEURONTIN) 100 MG capsule Take 100 mg by mouth every Monday, Wednesday, and Friday with hemodialysis. Before Dialysis    [provider]  hydrALAZINE (APRESOLINE) 25 MG tablet Take 25 mg by mouth daily.    [provider]  isosorbide mononitrate (IMDUR) 30 MG 24 hr tablet Take 1 tablet (30 mg total) by mouth daily. Patient taking differently: Take 30 mg by mouth daily. (1100) 02/24/20   Dhungel, Flonnie Overman, MD  lidocaine-prilocaine (EMLA) cream Apply 1 application topically daily as needed (applied prior to port being accessed). 09/18/20   [provider]  losartan (COZAAR) 50 MG tablet Take 50 mg by mouth daily.    [provider]  meclizine (ANTIVERT) 25 MG tablet Take 25 mg by mouth 3 (three) times daily as needed for dizziness.    [provider]  metoprolol succinate (TOPROL-XL) 25 MG 24 hr tablet Take 25 mg by mouth daily. 11/28/20   [provider]  multivitamin (RENA-VIT) TABS tablet Take 1 tablet by mouth daily.    [provider]  omeprazole (PRILOSEC) 20 MG capsule Take 20 mg by mouth daily as needed (reflux).  08/18/20   [provider]    Allergies  Allergen Reactions   Amlodipine     Unknown   Azithromycin     Unknown    Family History  Problem Relation Age of Onset   Breast cancer Sister 20    Social History Social History   Tobacco Use   Smoking status: Never   Smokeless tobacco: Never  Vaping Use   Vaping Use: Never used  Substance Use Topics   Alcohol use: Not Currently    Comment: rare   Drug use: No    Review of Systems Constitutional: Negative for fever. Cardiovascular: Burning type chest pain mostly at night when lying down.  Intermittent over the past 2 weeks.  Moderate burning type pain.  No pain currently. Respiratory: Negative for shortness of breath. Gastrointestinal: Negative for abdominal pain, vomiting Musculoskeletal: Negative for musculoskeletal complaints Skin: Negative for skin complaints  Neurological: Negative for  headache All other ROS negative  ____________________________________________   PHYSICAL EXAM:  VITAL SIGNS: ED Triage Vitals  Enc Vitals Group     BP 04/10/21 0926 (!) 161/46     Pulse Rate 04/10/21 0922 64     Resp 04/10/21 0922 17     Temp 04/10/21 0922 98 F (36.7 C)     Temp Source 04/10/21 0922 Oral     SpO2 04/10/21 0922 97 %     Weight 04/10/21 0922 167 lb (75.8 kg)     Height 04/10/21 0922 '4\' 9"'$  (1.448 m)     Head Circumference --      Peak Flow --      Pain Score 04/10/21 0922 0     Pain Loc --      Pain Edu? --      Excl. in [place]? --    Constitutional: Alert and oriented. Well appearing and in no distress. Eyes: Normal exam ENT      Head: Normocephalic and atraumatic.      Mouth/Throat: Mucous  membranes are moist. Cardiovascular: Normal rate, regular rhythm. Respiratory: Normal respiratory effort without tachypnea nor retractions. Breath sounds are clear Gastrointestinal: Soft and nontender. No distention.   Musculoskeletal: Nontender with normal range of motion in all extremities.  Neurologic:  Normal speech and language. No gross focal neurologic deficits  Skin:  Skin is warm, dry and intact.  Psychiatric: Mood and affect are normal.   ____________________________________________    EKG  EKG viewed and interpreted by myself shows a normal sinus rhythm at 67 bpm with a narrow QRS, normal axis, normal intervals, no concerning ST changes.  ____________________________________________    RADIOLOGY  Vascular congestion without edema  ____________________________________________   INITIAL IMPRESSION / ASSESSMENT AND PLAN / ED COURSE  Pertinent labs & imaging results that were available during my care of the patient were reviewed by me and considered in my medical decision making (see chart for details).   Patient presents to the emergency department for epigastric burning discomfort that radiates into the chest mostly at night when lying down.  No discomfort currently.  We will check labs including cardiac enzymes, EKG and a chest x-ray and continue to closely monitor.  Reassuring physical exam and vitals.  Differential would include gastric reflux, gastritis, peptic ulcer disease, ACS.  Labs show significant creatinine elevation, patient is on dialysis Monday/Wednesday/Friday for the past 1 year.  Patient's troponin is significantly elevated 222.  Given the patient's chest pain and elevated troponin we will admit to the hospital service for further work-up treatment and cardiology consultation.  We will dose aspirin in the emergency department while awaiting second troponin.  SHANEAKA PULK was evaluated in Emergency Department on 04/10/2021 for the symptoms described in the  history of present illness. She was evaluated in the context of the global COVID-19 pandemic, which necessitated consideration that the patient might be at risk for infection with the SARS-CoV-2 virus that causes COVID-19. Institutional protocols and algorithms that pertain to the evaluation of patients at risk for COVID-19 are in a state of rapid change based on information released by regulatory bodies including the CDC and federal and state organizations. These policies and algorithms were followed during the patient's care in the ED.  ____________________________________________   FINAL CLINICAL IMPRESSION(S) / ED DIAGNOSES  Reflux Chest pain Elevated troponin   Harvest Dark, MD 04/10/21 1114

## 2021-04-10 NOTE — H&P (Addendum)
History and Physical    Leah Davies [id number] DOB: 06-25-1941 DOA: 04/10/2021  PCP: Gladstone Lighter, MD   Patient coming from: Home  I have personally briefly reviewed patient's old medical records in [place]  Chief Complaint: Chest pain  HPI: Leah Davies is a 80 y.o. female with medical history significant for end-stage renal disease on hemodialysis, dialysis days on Monday/Wednesday/Friday, hypertension who presents to the emergency room for evaluation of chest pain which she has had intermittently for 2 weeks.  Chest pain is mostly midsternal, and is described as a burning sensation with radiation to her left arm and jaw.  Chest pain is mostly at night but she denies having any nausea, no vomiting, no palpitations, no diaphoresis. She complains of shortness of breath but this appears to be chronic for her. She denies having any cough, no fever, no chills, no dizziness, no lightheadedness, no headache, no blurred vision, no focal deficits, no changes in her bowel habits.  Labs show sodium 138, potassium 5.3, chloride 106, bicarb 21, glucose 108, BUN 80, creatinine 7.13, calcium 9.2, alkaline phosphatase 66, albumin 3.8, lipase 63, AST 30, ALT 24, total protein 6.9, troponin 222, white count 6.6, hemoglobin 10.2, hematocrit 30.5, MCV 95, RDW 12.9, platelet count 187 Respiratory viral panel is negative Chest x-ray reviewed by me shows pulmonary vascular congestion but no overt pulmonary edema. 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  Chest pain is mostly at night. Patient troponin is elevated at 222.  Patient does not have any EKG changes. She will be referred to observation status for  further evaluation.  Review of Systems: As per HPI otherwise all other systems reviewed and negative.    Past Medical History:  Diagnosis Date   Chronic kidney disease    renal insufficiency   Complication of anesthesia    Vital signs low during colonoscopy had trouble to wake up    Elevated lipids    GERD (gastroesophageal reflux disease)    Gout    Hypertension     Past Surgical History:  Procedure Laterality Date   A/V FISTULAGRAM Left 08/22/2020   Procedure: A/V FISTULAGRAM;  Surgeon: Katha Cabal, MD;  Location: [place] [facility];  Service: Cardiovascular;  Laterality: Left;   A/V FISTULAGRAM Left 01/02/2021   Procedure: A/V FISTULAGRAM;  Surgeon: Katha Cabal, MD;  Location: [place] [facility];  Service: Cardiovascular;  Laterality: Left;   AV FISTULA PLACEMENT Left 07/28/2020   Procedure: ARTERIOVENOUS (AV) FISTULA CREATION (BRACHIAL CEPHALIC );  Surgeon: Katha Cabal, MD;  Location: [facility];  Service: Vascular;  Laterality: Left;   BREAST EXCISIONAL BIOPSY Left 2010   benign   BREAST SURGERY     Lt breast biopsy   DIALYSIS/PERMA CATHETER INSERTION N/A 04/10/2020   Procedure: DIALYSIS/PERMA CATHETER INSERTION;  Surgeon: Algernon Huxley, MD;  Location: [place] [facility];  Service: Cardiovascular;  Laterality: N/A;   DIALYSIS/PERMA CATHETER REMOVAL N/A 01/30/2021   Procedure: DIALYSIS/PERMA CATHETER REMOVAL;  Surgeon: Katha Cabal, MD;  Location: [place] [facility];  Service: Cardiovascular;  Laterality: N/A;   KNEE ARTHROSCOPY Left 10/05/2015   Procedure: ARTHROSCOPY KNEE partial medial and lateral meniscectomy, lateral release  for sublux patella;  Surgeon: Hessie Knows, MD;  Location: [facility];  Service: Orthopedics;  Laterality: Left;     reports that she has never smoked. She has never used smokeless tobacco. She reports previous alcohol use. She reports that she does not use drugs.  Allergies  Allergen Reactions   Amlodipine Hypertension     Caused increased blood pressure   Azithromycin Other (See Comments)    Unknown    Family History  Problem Relation Age of Onset   Breast cancer Sister 57      Prior to Admission medications   Medication Sig Start Date End Date Taking? Authorizing Provider  hydrALAZINE (APRESOLINE) 25 MG tablet Take 25 mg by mouth in the morning and at bedtime. 1100 and 1900   Yes [provider]  isosorbide mononitrate (IMDUR) 30 MG 24 hr tablet Take 1 tablet (30 mg total) by mouth daily. Patient taking differently: Take 30 mg by mouth daily. (1100) 02/24/20  Yes Dhungel, Nishant, MD  losartan (COZAAR) 50 MG tablet Take 50 mg by mouth daily.   Yes [provider]  metoprolol succinate (TOPROL-XL) 25 MG 24 hr tablet Take 25 mg by mouth daily. 11/28/20  Yes [provider]  omeprazole (PRILOSEC) 20 MG capsule Take 20 mg by mouth daily as needed (reflux).  08/18/20  Yes [provider]    Physical Exam: Vitals:   04/10/21 1100 04/10/21 1130 04/10/21 1200 04/10/21 1230  BP: (!) 170/51 (!) 185/67 (!) 175/51 (!) 162/50  Pulse: 61 65 66 (!) 58  Resp: '18 19 18 '$ (!) 25  Temp:      TempSrc:      SpO2: 98% 99% 100% 99%  Weight:      Height:         Vitals:   04/10/21 1100 04/10/21 1130 04/10/21 1200 04/10/21 1230  BP: (!) 170/51 (!) 185/67 (!) 175/51 (!) 162/50  Pulse: 61 65 66 (!) 58  Resp: '18 19 18 '$ (!) 25  Temp:      TempSrc:      SpO2: 98% 99% 100% 99%  Weight:      Height:          Constitutional: Alert and oriented x 3 . Not in any apparent distress HEENT:      Head: Normocephalic and atraumatic.         Eyes: PERLA, EOMI, Conjunctivae are normal. Sclera is non-icteric.       Mouth/Throat: Mucous membranes are moist.       Neck: Supple with no signs of meningismus. Cardiovascular: Regular rate and rhythm. No murmurs, gallops, or rubs. 2+ symmetrical distal pulses are present . No JVD. No LE edema Respiratory: Respiratory effort normal .Lungs sounds  clear bilaterally. No wheezes, crackles, or rhonchi.  Gastrointestinal: Soft, non tender, and non distended with positive bowel sounds.  Genitourinary: No CVA tenderness. Musculoskeletal: Nontender with normal range of motion in all extremities. No cyanosis, or erythema of extremities. Neurologic:  Face is symmetric. Moving all extremities. No gross focal neurologic deficits . Skin: Skin is warm, dry.  No rash or ulcers Psychiatric: Mood and affect are normal    Labs on Admission: I have personally reviewed following labs and imaging studies  CBC: Recent Labs  Lab 04/10/21 0924  WBC 6.6  HGB 10.2*  HCT 30.5*  MCV 95.0  PLT 123XX123   Basic Metabolic Panel: Recent Labs  Lab 04/10/21 0934  NA 138  K 5.3*  CL 106  CO2 21*  GLUCOSE 108*  BUN 80*  CREATININE 7.13*  CALCIUM 9.2   GFR: Estimated Creatinine Clearance: 5.5 mL/min (A) (by C-G formula based on SCr of 7.13 mg/dL (H)). Liver Function Tests: Recent Labs  Lab 04/10/21 0934  AST 30  ALT 24  ALKPHOS 66  BILITOT 1.0  PROT 6.9  ALBUMIN 3.8   Recent Labs  Lab 04/10/21 0934  LIPASE 63*   No results for input(s): AMMONIA in the last 168 hours. Coagulation Profile: No results for input(s): INR, PROTIME in the last 168 hours. Cardiac Enzymes: No results for input(s): CKTOTAL, CKMB, CKMBINDEX, TROPONINI in the last 168 hours. BNP (last 3 results) No results for input(s): PROBNP in the last 8760 hours. HbA1C: No results for input(s): HGBA1C in the last 72 hours. CBG: No results for input(s): GLUCAP in the last 168 hours. Lipid Profile: No results for input(s): CHOL, HDL, LDLCALC, TRIG, CHOLHDL, LDLDIRECT in the last 72 hours. Thyroid Function Tests: No results for input(s): TSH, T4TOTAL, FREET4, T3FREE, THYROIDAB in the last 72 hours. Anemia Panel: No results for input(s): VITAMINB12, FOLATE, FERRITIN, TIBC, IRON, RETICCTPCT in the last 72 hours. Urine analysis:    Component Value Date/Time   COLORURINE  COLORLESS (A) 04/08/2020 1535   APPEARANCEUR CLEAR (A) 04/08/2020 1535   LABSPEC 1.006 04/08/2020 1535   PHURINE 5.0 04/08/2020 1535   GLUCOSEU NEGATIVE 04/08/2020 1535   HGBUR NEGATIVE 04/08/2020 1535   BILIRUBINUR NEGATIVE 04/08/2020 1535   KETONESUR NEGATIVE 04/08/2020 1535   PROTEINUR 100 (A) 04/08/2020 1535   NITRITE NEGATIVE 04/08/2020 1535   LEUKOCYTESUR NEGATIVE 04/08/2020 1535    Radiological Exams on Admission: DG Chest 2 View  Result Date: 04/10/2021 CLINICAL DATA:  80 year old female with chest and epigastric pain for 2 weeks. EXAM: CHEST - 2 VIEW COMPARISON:  Chest radiographs 06/27/2020 and earlier. FINDINGS: Right chest dialysis catheter removed since last year. Lower lung volumes on both views. Stable borderline to mild cardiomegaly. Other mediastinal contours are within normal limits. Visualized tracheal air column is within normal limits. No pneumothorax, pleural effusion or consolidation. Indistinct pulmonary vasculature compared to last year. No acute osseous abnormality identified. Negative visible bowel gas pattern. IMPRESSION: Lower lung volumes with pulmonary vasculature congestion but no overt edema. Electronically Signed   By: Genevie Ann M.D.   On: 04/10/2021 10:05     Assessment/Plan Principal Problem:   Chest pain Active Problems:   Essential hypertension   ESRD (end stage renal disease) (HCC)   Gastro-esophageal reflux disease without esophagitis   Obesity (BMI 30-39.9)      Chest pain In a patient with a history history of end-stage renal disease on dialysis and hypertension who presents for evaluation of midsternal chest pain mostly at rest with radiation to the left arm and jaw. Initial troponin is elevated at 222 but she has no acute EKG changes We will cycle cardiac enzymes Place patient on aspirin 81 mg daily Continue nitrates, beta-blocker and statins Obtain 2D echocardiogram to assess LVEF and rule out regional wall motion abnormality Consult  cardiology    End-stage renal disease on hemodialysis Dialysis days are Monday/Wednesday/Friday Patient missed her dialysis treatment on Monday 07/11 but does not show any signs of respiratory distress or fluid overload.  Potassium is slightly elevated at 5.3 Will consult nephrology for renal replacement therapy Hold Cozaar    Hypertension Continue hydralazine, nitrates and metoprolol    GERD  Continue Protonix    Obesity (BMI 36) Complicates overall prognosis and care Lifestyle modification and exercise  has been discussed with patient in detail.  DVT prophylaxis: Heparin  Code Status: full code  Family Communication: Greater than 50% of time was spent discussing patient's condition and plan of care with her and her daughter at the bedside.  All questions and concerns have been addressed.  They verbalized understanding and agree with the plan. Disposition Plan: Back to previous home environment Consults called: Cardiology Status: Observation    Kevontae Burgoon MD [facility]     04/10/2021, 1:24 PM

## 2021-04-10 NOTE — ED Triage Notes (Signed)
Per pt daughter, pt has been having epigastric pain that radiates into the chest at night for the past 2 weeks.

## 2021-04-10 NOTE — Progress Notes (Signed)
[facility]  ROUNDING NOTE   Subjective:   Leah Davies  is a 80 y.o. female with medical history including hypertension, GERD and ESRD on dialysis. She presents to ED with complaints of chest pain for 2 weeks.   Patient is currently seen by [facility] and receives dialysis treatments at [facility] on MWF. She states she has a pain that starts in her stomach and rises to her chest. She states it is not associated with dialysis treatments. Admits to nausea and vomiting over the past few days. Denies diarrhea. Denies shortness of breath. She missed dialysis yesterday due to not feeling well.   Objective:  Vital signs in last 24 hours:  Temp:  [98 F (36.7 C)] 98 F (36.7 C) (07/12 0922) Pulse Rate:  [58-66] 58 (07/12 1230) Resp:  [13-25] 25 (07/12 1230) BP: (161-185)/(46-67) 162/50 (07/12 1230) SpO2:  [97 %-100 %] 99 % (07/12 1230) Weight:  [75.8 kg] 75.8 kg (07/12 0922)  Weight change:  Filed Weights   04/10/21 0922  Weight: 75.8 kg    Intake/Output: No intake/output data recorded.   Intake/Output this shift:  No intake/output data recorded.  Physical Exam: General: NAD, resting on stretcher  Head: Normocephalic, atraumatic. dry oral mucosal membranes  Eyes: Anicteric  Lungs:  Clear to auscultation, normal effort  Heart: Regular rate and rhythm  Abdomen:  Soft, nontender  Extremities:  no peripheral edema.  Neurologic: Nonfocal, moving all four extremities  Skin: No lesions  Access: Lt AVF    Basic Metabolic Panel: Recent Labs  Lab 04/10/21 0934  NA 138  K 5.3*  CL 106  CO2 21*  GLUCOSE 108*  BUN 80*  CREATININE 7.13*  CALCIUM 9.2    Liver Function Tests: Recent Labs  Lab 04/10/21 0934  AST 30  ALT 24  ALKPHOS 66  BILITOT 1.0  PROT 6.9  ALBUMIN 3.8   Recent Labs  Lab 04/10/21 0934  LIPASE 63*   No results for input(s): AMMONIA in the last 168 hours.  CBC: Recent Labs  Lab 04/10/21 0924  WBC 6.6  HGB 10.2*  HCT 30.5*   MCV 95.0  PLT 187    Cardiac Enzymes: No results for input(s): CKTOTAL, CKMB, CKMBINDEX, TROPONINI in the last 168 hours.  BNP: Invalid input(s): POCBNP  CBG: No results for input(s): GLUCAP in the last 168 hours.  Microbiology: Results for orders placed or performed during the hospital encounter of 04/10/21  Resp Panel by RT-PCR (Flu A&B, Covid) Nasopharyngeal Swab     Status: None   Collection Time: 04/10/21 11:56 AM   Specimen: Nasopharyngeal Swab; Nasopharyngeal(NP) swabs in vial transport medium  Result Value Ref Range Status   SARS Coronavirus 2 by RT PCR NEGATIVE NEGATIVE Final    Comment: (NOTE) SARS-CoV-2 target nucleic acids are NOT DETECTED.  The SARS-CoV-2 RNA is generally detectable in upper respiratory specimens during the acute phase of infection. The lowest concentration of SARS-CoV-2 viral copies this assay can detect is 138 copies/mL. A negative result does not preclude SARS-Cov-2 infection and should not be used as the sole basis for treatment or other patient management decisions. A negative result may occur with  improper specimen collection/handling, submission of specimen other than nasopharyngeal swab, presence of viral mutation(s) within the areas targeted by this assay, and inadequate number of viral copies(<138 copies/mL). A negative result must be combined with clinical observations, patient history, and epidemiological information. The expected result is Negative.  Fact Sheet for Patients:  EntrepreneurPulse.com.au  Fact Sheet for Healthcare Providers:  IncredibleEmployment.be  This test is no t yet approved or cleared by the [place] FDA and  has been authorized for detection and/or diagnosis of SARS-CoV-2 by FDA under an Emergency Use Authorization (EUA). This EUA will remain  in effect (meaning this test can be used) for the duration of the COVID-19 declaration under Section 564(b)(1) of the Act,  21 U.S.C.section 360bbb-3(b)(1), unless the authorization is terminated  or revoked sooner.       Influenza A by PCR NEGATIVE NEGATIVE Final   Influenza B by PCR NEGATIVE NEGATIVE Final    Comment: (NOTE) The Xpert Xpress SARS-CoV-2/FLU/RSV plus assay is intended as an aid in the diagnosis of influenza from Nasopharyngeal swab specimens and should not be used as a sole basis for treatment. Nasal washings and aspirates are unacceptable for Xpert Xpress SARS-CoV-2/FLU/RSV testing.  Fact Sheet for Patients: EntrepreneurPulse.com.au  Fact Sheet for Healthcare Providers: IncredibleEmployment.be  This test is not yet approved or cleared by the [place] FDA and has been authorized for detection and/or diagnosis of SARS-CoV-2 by FDA under an Emergency Use Authorization (EUA). This EUA will remain in effect (meaning this test can be used) for the duration of the COVID-19 declaration under Section 564(b)(1) of the Act, 21 U.S.C. section 360bbb-3(b)(1), unless the authorization is terminated or revoked.  Performed at [facility], [address] [place],  [address]     Coagulation Studies: No results for input(s): LABPROT, INR in the last 72 hours.  Urinalysis: No results for input(s): COLORURINE, LABSPEC, PHURINE, GLUCOSEU, HGBUR, BILIRUBINUR, KETONESUR, PROTEINUR, UROBILINOGEN, NITRITE, LEUKOCYTESUR in the last 72 hours.  Invalid input(s): APPERANCEUR    Imaging: DG Chest 2 View  Result Date: 04/10/2021 CLINICAL DATA:  80 year old female with chest and epigastric pain for 2 weeks. EXAM: CHEST - 2 VIEW COMPARISON:  Chest radiographs 06/27/2020 and earlier. FINDINGS: Right chest dialysis catheter removed since last year. Lower lung volumes on both views. Stable borderline to mild cardiomegaly. Other mediastinal contours are within normal limits. Visualized tracheal air column is within normal limits. No pneumothorax, pleural  effusion or consolidation. Indistinct pulmonary vasculature compared to last year. No acute osseous abnormality identified. Negative visible bowel gas pattern. IMPRESSION: Lower lung volumes with pulmonary vasculature congestion but no overt edema. Electronically Signed   By: Genevie Ann M.D.   On: 04/10/2021 10:05     Medications:     [START ON 04/11/2021] aspirin EC  81 mg Oral Daily   atorvastatin  40 mg Oral Daily   heparin injection (subcutaneous)  5,000 Units Subcutaneous Q8H   hydrALAZINE  25 mg Oral BID   isosorbide mononitrate  30 mg Oral Daily   metoprolol succinate  25 mg Oral Daily   pantoprazole  40 mg Oral Daily   acetaminophen, ondansetron (ZOFRAN) IV  Assessment/ Plan:  Ms. Leah Davies is a 80 y.o.  female with medical history including hypertension, GERD and ESRD on dialysis. She presents to ED with complaints of chest pain for 2 weeks.   [facility]/MWF/Lt AVF/66 kg  Hypertension 162/50 Not well controlled. Home regimen includes Hydralazine, Isosorbide, and losartan.  Currently receiving Hydralazine, Isosorbide and Metoprolol.  2. End stage renal disease on dialysis  Will maintain outpatient schedule during admission  Missed treatment yesterday, but stable at this time  Plan to dialyze tomorrow  3. Anemia of chronic kidney disease  Lab Results  Component Value Date   HGB 10.2 (L) 04/10/2021  Hgb  at goal EPO outpatient   4. Secondary Hyperparathyroidism:   Lab Results  Component Value Date   PTH 336 (H) 04/10/2020   CALCIUM 9.2 04/10/2021   CAION 0.78 (LL) 07/28/2020   PHOS 4.1 04/10/2020    Phosphorus and calcium at goal No binders at this time   LOS: 0 Kalem Rockwell 7/12/20222:02 PM

## 2021-04-10 NOTE — Progress Notes (Signed)
*  PRELIMINARY RESULTS* Echocardiogram 2D Echocardiogram has been performed.  Sherrie Sport 04/10/2021, 2:23 PM

## 2021-04-10 NOTE — Progress Notes (Signed)
Hemodialysis  patient known a [facility] MWF 6:30am. Daughter normally transports. Per clinic, patient missed Mondays treatment and scheduled for today. Last treatment was Friday, Dr. Juleen China aware. Please contact me with any dialysis placement concerns.  Elvera Bicker Dialysis Coordinator [phone]

## 2021-04-10 NOTE — ED Notes (Signed)
Pt transported to xray 

## 2021-04-10 NOTE — ED Notes (Signed)
This RN attempted IV acces x2 without success. This RN was able to obtain lab work

## 2021-04-10 NOTE — Consult Note (Signed)
Cardiology Consultation Note    Patient ID: Leah Davies, MRN: [id number], DOB/AGE: 79-Mar-1942 79 y.o. Admit date: 04/10/2021   Date of Consult: 04/10/2021 Primary Physician: Gladstone Lighter, MD Primary Cardiologist: none  Chief Complaint: chest pain Reason for Consultation: chest pain Requesting MD: Dr. Francine Graven  HPI: Leah Davies is a 80 y.o. female with history of chronic renal disease, gastric reflux disease, hypertension who presented to emergency room with 2 weeks of chest pain.  Is in epigastric pain radiating up into her throat.  Does not appear to be exertional.  She had a history of gastritis in the past and was initially on omeprazole but has not been compliant with this.  In the emergency room electrocardiogram was unremarkable.  Nonspecific ST-T wave changes with sinus rhythm but no active ischemia.  Laboratories her initial serum troponin was 222.  Creatinine was 4.4 with a GFR of 10.  She is pain-free in the emergency room.  Chest x-ray some mild congestion but no pulmonary edema.  She has a an AV fistulogram which was placed in November of last year.  Her chest pain was not exertional and more positional.  She apparently missed her most recent dialysis.  Symptoms worsened after this.  Past Medical History:  Diagnosis Date   Chronic kidney disease    renal insufficiency   Complication of anesthesia    Vital signs low during colonoscopy had trouble to wake up    Elevated lipids    GERD (gastroesophageal reflux disease)    Gout    Hypertension       Surgical History:  Past Surgical History:  Procedure Laterality Date   A/V FISTULAGRAM Left 08/22/2020   Procedure: A/V FISTULAGRAM;  Surgeon: Katha Cabal, MD;  Location: [facility];  Service: Cardiovascular;  Laterality: Left;   A/V FISTULAGRAM Left 01/02/2021   Procedure: A/V FISTULAGRAM;  Surgeon: Katha Cabal, MD;  Location: [facility];  Service: Cardiovascular;  Laterality: Left;   AV  FISTULA PLACEMENT Left 07/28/2020   Procedure: ARTERIOVENOUS (AV) FISTULA CREATION (BRACHIAL CEPHALIC );  Surgeon: Katha Cabal, MD;  Location: [facility];  Service: Vascular;  Laterality: Left;   BREAST EXCISIONAL BIOPSY Left 2010   benign   BREAST SURGERY     Lt breast biopsy   DIALYSIS/PERMA CATHETER INSERTION N/A 04/10/2020   Procedure: DIALYSIS/PERMA CATHETER INSERTION;  Surgeon: Algernon Huxley, MD;  Location: [facility];  Service: Cardiovascular;  Laterality: N/A;   DIALYSIS/PERMA CATHETER REMOVAL N/A 01/30/2021   Procedure: DIALYSIS/PERMA CATHETER REMOVAL;  Surgeon: Katha Cabal, MD;  Location: [facility];  Service: Cardiovascular;  Laterality: N/A;   KNEE ARTHROSCOPY Left 10/05/2015   Procedure: ARTHROSCOPY KNEE partial medial and lateral meniscectomy, lateral release for sublux patella;  Surgeon: Hessie Knows, MD;  Location: [facility];  Service: Orthopedics;  Laterality: Left;     Home Meds: Prior to Admission medications   Medication Sig Start Date End Date Taking? Authorizing Provider  hydrALAZINE (APRESOLINE) 25 MG tablet Take 25 mg by mouth in the morning and at bedtime. 1100 and 1900   Yes [provider]  isosorbide mononitrate (IMDUR) 30 MG 24 hr tablet Take 1 tablet (30 mg total) by mouth daily. Patient taking differently: Take 30 mg by mouth daily. (1100) 02/24/20  Yes Dhungel, Nishant, MD  losartan (COZAAR) 50 MG tablet Take 50 mg by mouth daily.   Yes [provider]  metoprolol succinate (TOPROL-XL) 25 MG  24 hr tablet Take 25 mg by mouth daily. 11/28/20  Yes [provider]  omeprazole (PRILOSEC) 20 MG capsule Take 20 mg by mouth daily as needed (reflux).  08/18/20  Yes [provider]    Inpatient Medications:   [START ON 04/11/2021] aspirin EC  81 mg Oral Daily   atorvastatin  40 mg Oral Daily   enoxaparin (LOVENOX) injection  30 mg Subcutaneous Q24H   hydrALAZINE  25 mg Oral BID   isosorbide mononitrate  30 mg  Oral Daily   losartan  50 mg Oral Daily   metoprolol succinate  25 mg Oral Daily   pantoprazole  40 mg Oral Daily     Allergies:  Allergies  Allergen Reactions   Amlodipine Hypertension    Caused increased blood pressure   Azithromycin Other (See Comments)    Unknown    Social History   Socioeconomic History   Marital status: Single    Spouse name: Not on file   Number of children: 6   Years of education: Not on file   Highest education level: Not on file  Occupational History   Not on file  Tobacco Use   Smoking status: Never   Smokeless tobacco: Never  Vaping Use   Vaping Use: Never used  Substance and Sexual Activity   Alcohol use: Not Currently    Comment: rare   Drug use: No   Sexual activity: Not on file  Other Topics Concern   Not on file  Social History Narrative   Lives with daughter Mable Fill.    Social Determinants of Health   Financial Resource Strain: Not on file  Food Insecurity: Not on file  Transportation Needs: Not on file  Physical Activity: Not on file  Stress: Not on file  Social Connections: Not on file  Intimate Partner Violence: Not on file     Family History  Problem Relation Age of Onset   Breast cancer Sister 11     Review of Systems: A 12-system review of systems was performed and is negative except as noted in the HPI.  Labs: No results for input(s): CKTOTAL, CKMB, TROPONINI in the last 72 hours. Lab Results  Component Value Date   WBC 6.6 04/10/2021   HGB 10.2 (L) 04/10/2021   HCT 30.5 (L) 04/10/2021   MCV 95.0 04/10/2021   PLT 187 04/10/2021    Recent Labs  Lab 04/10/21 0934  NA 138  K 5.3*  CL 106  CO2 21*  BUN 80*  CREATININE 7.13*  CALCIUM 9.2  PROT 6.9  BILITOT 1.0  ALKPHOS 66  ALT 24  AST 30  GLUCOSE 108*   No results found for: CHOL, HDL, LDLCALC, TRIG No results found for: DDIMER  Radiology/Studies:  DG Chest 2 View  Result Date: 04/10/2021 CLINICAL DATA:  80 year old female with chest and  epigastric pain for 2 weeks. EXAM: CHEST - 2 VIEW COMPARISON:  Chest radiographs 06/27/2020 and earlier. FINDINGS: Right chest dialysis catheter removed since last year. Lower lung volumes on both views. Stable borderline to mild cardiomegaly. Other mediastinal contours are within normal limits. Visualized tracheal air column is within normal limits. No pneumothorax, pleural effusion or consolidation. Indistinct pulmonary vasculature compared to last year. No acute osseous abnormality identified. Negative visible bowel gas pattern. IMPRESSION: Lower lung volumes with pulmonary vasculature congestion but no overt edema. Electronically Signed   By: Genevie Ann M.D.   On: 04/10/2021 10:05    Wt Readings from Last 3 Encounters:  04/10/21 75.8 kg  01/18/21 68 kg  01/02/21 67 kg    EKG: Normal sinus rhythm with no ischemia  Physical Exam:  Blood pressure (!) 175/51, pulse 66, temperature 98 F (36.7 C), temperature source Oral, resp. rate 18, height '4\' 9"'$  (1.448 m), weight 75.8 kg, SpO2 100 %. Body mass index is 36.14 kg/m. General: Well developed, well nourished, in no acute distress. Head: Normocephalic, atraumatic, sclera non-icteric, no xanthomas, nares are without discharge.  Neck: Negative for carotid bruits. JVD not elevated. Lungs: Clear bilaterally to auscultation without wheezes, rales, or rhonchi. Breathing is unlabored. Heart: RRR with S1 S2. No murmurs, rubs, or gallops appreciated. Abdomen: Soft, non-tender, non-distended with normoactive bowel sounds. No hepatomegaly. No rebound/guarding. No obvious abdominal masses. Msk:  Strength and tone appear normal for age. Extremities: No clubbing or cyanosis. No edema.  Distal pedal pulses are 2+ and equal bilaterally. Neuro: Alert and oriented X 3. No facial asymmetry. No focal deficit. Moves all extremities spontaneously. Psych:  Responds to questions appropriately with a normal affect.     Assessment and Plan  80 year old female with  end-stage renal disease on hemodialysis who apparently missed her last dialysis.  She presented with chest pain.  Has a history of reflux disease and symptoms have features typical of this as well as atypical angina.  Her serum creatinine is 4.4.  Her serum troponin initially was 222.  Subsequent's are pending.  She had an AV fistula gram placed in November of last year.  1.  Chest pain-etiology unclear.  May be GI in nature.  Troponin is elevated however in the face of renal insufficiency and missing a dialysis episode, this may be nondiagnostic.  We will review subsequent values.  We will proceed with an echocardiogram to evaluate LV function.  Continue with current regimen.  Not a candidate for heparin at present.  2.  End-stage renal disease-hemodialysis when available  3.  Hypertension-continue with current regimen.  Signed, Teodoro Spray MD 04/10/2021, 12:27 PM Pager: [phone]

## 2021-04-10 NOTE — ED Notes (Addendum)
MD stating it is ok for pt to take at home medication at this time.

## 2021-04-10 NOTE — ED Notes (Signed)
RN reported critical troponin result to MD [facility]

## 2021-04-11 ENCOUNTER — Encounter
Admission: EM | Disposition: A | Discharge status: Home / Self Care | Payer: Self-pay | Source: Home / Self Care | Attending: Emergency Medicine

## 2021-04-11 ENCOUNTER — Other Ambulatory Visit (INDEPENDENT_AMBULATORY_CARE_PROVIDER_SITE_OTHER): Payer: Self-pay | Admitting: Vascular Surgery

## 2021-04-11 DIAGNOSIS — N186 End stage renal disease: Secondary | ICD-10-CM

## 2021-04-11 DIAGNOSIS — R079 Chest pain, unspecified: Secondary | ICD-10-CM

## 2021-04-11 DIAGNOSIS — T82590A Other mechanical complication of surgically created arteriovenous fistula, initial encounter: Secondary | ICD-10-CM | POA: Diagnosis not present

## 2021-04-11 DIAGNOSIS — R072 Precordial pain: Secondary | ICD-10-CM

## 2021-04-11 DIAGNOSIS — T82858A Stenosis of vascular prosthetic devices, implants and grafts, initial encounter: Secondary | ICD-10-CM | POA: Diagnosis not present

## 2021-04-11 DIAGNOSIS — Y832 Surgical operation with anastomosis, bypass or graft as the cause of abnormal reaction of the patient, or of later complication, without mention of misadventure at the time of the procedure: Secondary | ICD-10-CM | POA: Diagnosis not present

## 2021-04-11 DIAGNOSIS — R778 Other specified abnormalities of plasma proteins: Secondary | ICD-10-CM

## 2021-04-11 HISTORY — PX: A/V FISTULAGRAM: CATH118298

## 2021-04-11 LAB — ECHOCARDIOGRAM COMPLETE
AR max vel: 2.02 cm2
AV Area VTI: 2.25 cm2
AV Area mean vel: 2.46 cm2
AV Mean grad: 5 mmHg
AV Peak grad: 11.7 mmHg
Ao pk vel: 1.71 m/s
Area-P 1/2: 3.13 cm2
Height: 57 in
S' Lateral: 2.63 cm
Weight: 2672 oz

## 2021-04-11 LAB — HEPATITIS B SURFACE ANTIBODY, QUANTITATIVE: Hep B S AB Quant (Post): <3.1 m[IU]/mL — ABNORMAL LOW (ref >9.9)

## 2021-04-11 SURGERY — A/V FISTULAGRAM
Anesthesia: Moderate Sedation | Laterality: Left

## 2021-04-11 MED ORDER — FAMOTIDINE 20 MG PO TABS: 40.0000 mg | ORAL_TABLET | Freq: Once | ORAL | Status: DC | PRN

## 2021-04-11 MED ORDER — FENTANYL CITRATE (PF) 100 MCG/2ML IJ SOLN
INTRAMUSCULAR | Status: AC
  Filled 2021-04-11: qty 2

## 2021-04-11 MED ORDER — CEFAZOLIN SODIUM-DEXTROSE 1-4 GM/50ML-% IV SOLN
1.0000 g | Freq: Once | INTRAVENOUS | Status: DC
  Filled 2021-04-11: qty 50

## 2021-04-11 MED ORDER — MIDAZOLAM HCL 5 MG/5ML IJ SOLN
INTRAMUSCULAR | Status: AC
  Filled 2021-04-11: qty 5

## 2021-04-11 MED ORDER — CEFAZOLIN SODIUM-DEXTROSE 1-4 GM/50ML-% IV SOLN
INTRAVENOUS | Status: AC
  Filled 2021-04-11: qty 50

## 2021-04-11 MED ORDER — DIPHENHYDRAMINE HCL 50 MG/ML IJ SOLN: 50.0000 mg | Freq: Once | INTRAMUSCULAR | Status: DC | PRN

## 2021-04-11 MED ORDER — HYDROMORPHONE HCL 1 MG/ML IJ SOLN
1.0000 mg | Freq: Once | INTRAMUSCULAR | Status: DC | PRN
Start: 2021-04-11 — End: 2021-04-12

## 2021-04-11 MED ORDER — ONDANSETRON HCL 4 MG/2ML IJ SOLN: 4.0000 mg | Freq: Four times a day (QID) | INTRAMUSCULAR | Status: DC | PRN

## 2021-04-11 MED ORDER — FENTANYL CITRATE (PF) 100 MCG/2ML IJ SOLN
INTRAMUSCULAR | Status: DC | PRN
  Administered 2021-04-11: 50 ug via INTRAVENOUS

## 2021-04-11 MED ORDER — MIDAZOLAM HCL 2 MG/2ML IJ SOLN
INTRAMUSCULAR | Status: DC | PRN
Start: 2021-04-11 — End: 2021-04-11
  Administered 2021-04-11: 1 mg via INTRAVENOUS

## 2021-04-11 MED ORDER — MIDAZOLAM HCL 2 MG/ML PO SYRP
8.0000 mg | ORAL_SOLUTION | Freq: Once | ORAL | Status: DC | PRN
  Filled 2021-04-11: qty 4

## 2021-04-11 MED ORDER — IODIXANOL 320 MG/ML IV SOLN
INTRAVENOUS | Status: DC | PRN
  Administered 2021-04-11: 15 mL

## 2021-04-11 MED ORDER — SODIUM CHLORIDE 0.9 % IV SOLN: INTRAVENOUS | Status: DC

## 2021-04-11 MED ORDER — METHYLPREDNISOLONE SODIUM SUCC 125 MG IJ SOLR: 125.0000 mg | Freq: Once | INTRAMUSCULAR | Status: DC | PRN

## 2021-04-11 SURGICAL SUPPLY — 6 items
CANNULA 5F STIFF (CANNULA) ×2 IMPLANT
COVER PROBE U/S 5X48 (MISCELLANEOUS) ×2 IMPLANT
DRAPE BRACHIAL (DRAPES) ×2 IMPLANT
PACK ANGIOGRAPHY (CUSTOM PROCEDURE TRAY) ×2 IMPLANT
SHEATH BRITE TIP 6FRX5.5 (SHEATH) ×2 IMPLANT
SUT MNCRL AB 4-0 PS2 18 (SUTURE) ×2 IMPLANT

## 2021-04-11 NOTE — Progress Notes (Signed)
[facility]  ROUNDING NOTE   Subjective:   Leah Davies  is a 80 y.o. female with medical history including hypertension, GERD and ESRD on dialysis. She presents to ED with complaints of chest pain for 2 weeks.   Patient is currently seen by [facility] and receives dialysis treatments at [facility] on MWF. She states she has a pain that starts in her stomach and rises to her chest.   Patient seen in dialysis suite Complains of soreness at HD access Denies nausea, vomiting, and shortness of breath  Objective:  Vital signs in last 24 hours:  Temp:  [97.7 F (36.5 C)-98.5 F (36.9 C)] 98.5 F (36.9 C) (07/13 0736) Pulse Rate:  [54-66] 56 (07/13 0736) Resp:  [13-25] 21 (07/13 0759) BP: (132-185)/(45-67) 158/45 (07/13 0736) SpO2:  [98 %-100 %] 99 % (07/13 0736) Weight:  [75.8 kg] 75.8 kg (07/12 1432)  Weight change:  Filed Weights   04/10/21 0922 04/10/21 1432  Weight: 75.8 kg 75.8 kg    Intake/Output: No intake/output data recorded.   Intake/Output this shift:  No intake/output data recorded.  Physical Exam: General: NAD, resting in bed  Head: Normocephalic, atraumatic. dry oral mucosal membranes  Eyes: Anicteric  Lungs:  Clear to auscultation, normal effort  Heart: Regular rate and rhythm  Abdomen:  Soft, nontender  Extremities:  no peripheral edema.  Neurologic: Nonfocal, moving all four extremities  Skin: No lesions  Access: Lt AVF    Basic Metabolic Panel: Recent Labs  Lab 04/10/21 0934  NA 138  K 5.3*  CL 106  CO2 21*  GLUCOSE 108*  BUN 80*  CREATININE 7.13*  CALCIUM 9.2     Liver Function Tests: Recent Labs  Lab 04/10/21 0934  AST 30  ALT 24  ALKPHOS 66  BILITOT 1.0  PROT 6.9  ALBUMIN 3.8    Recent Labs  Lab 04/10/21 0934  LIPASE 63*    No results for input(s): AMMONIA in the last 168 hours.  CBC: Recent Labs  Lab 04/10/21 0924  WBC 6.6  HGB 10.2*  HCT 30.5*  MCV 95.0  PLT 187     Cardiac Enzymes: No  results for input(s): CKTOTAL, CKMB, CKMBINDEX, TROPONINI in the last 168 hours.  BNP: Invalid input(s): POCBNP  CBG: No results for input(s): GLUCAP in the last 168 hours.  Microbiology: Results for orders placed or performed during the hospital encounter of 04/10/21  Resp Panel by RT-PCR (Flu A&B, Covid) Nasopharyngeal Swab     Status: None   Collection Time: 04/10/21 11:56 AM   Specimen: Nasopharyngeal Swab; Nasopharyngeal(NP) swabs in vial transport medium  Result Value Ref Range Status   SARS Coronavirus 2 by RT PCR NEGATIVE NEGATIVE Final    Comment: (NOTE) SARS-CoV-2 target nucleic acids are NOT DETECTED.  The SARS-CoV-2 RNA is generally detectable in upper respiratory specimens during the acute phase of infection. The lowest concentration of SARS-CoV-2 viral copies this assay can detect is 138 copies/mL. A negative result does not preclude SARS-Cov-2 infection and should not be used as the sole basis for treatment or other patient management decisions. A negative result may occur with  improper specimen collection/handling, submission of specimen other than nasopharyngeal swab, presence of viral mutation(s) within the areas targeted by this assay, and inadequate number of viral copies(<138 copies/mL). A negative result must be combined with clinical observations, patient history, and epidemiological information. The expected result is Negative.  Fact Sheet for Patients:  EntrepreneurPulse.com.au  Fact Sheet for  Healthcare Providers:  IncredibleEmployment.be  This test is no t yet approved or cleared by the [place] and  has been authorized for detection and/or diagnosis of SARS-CoV-2 by FDA under an Emergency Use Authorization (EUA). This EUA will remain  in effect (meaning this test can be used) for the duration of the COVID-19 declaration under Section 564(b)(1) of the Act, 21 U.S.C.section 360bbb-3(b)(1), unless the  authorization is terminated  or revoked sooner.       Influenza A by PCR NEGATIVE NEGATIVE Final   Influenza B by PCR NEGATIVE NEGATIVE Final    Comment: (NOTE) The Xpert Xpress SARS-CoV-2/FLU/RSV plus assay is intended as an aid in the diagnosis of influenza from Nasopharyngeal swab specimens and should not be used as a sole basis for treatment. Nasal washings and aspirates are unacceptable for Xpert Xpress SARS-CoV-2/FLU/RSV testing.  Fact Sheet for Patients: EntrepreneurPulse.com.au  Fact Sheet for Healthcare Providers: IncredibleEmployment.be  This test is not yet approved or cleared by the [place] FDA and has been authorized for detection and/or diagnosis of SARS-CoV-2 by FDA under an Emergency Use Authorization (EUA). This EUA will remain in effect (meaning this test can be used) for the duration of the COVID-19 declaration under Section 564(b)(1) of the Act, 21 U.S.C. section 360bbb-3(b)(1), unless the authorization is terminated or revoked.  Performed at [facility], [address] [place], [place] [address]     Coagulation Studies: No results for input(s): LABPROT, INR in the last 72 hours.  Urinalysis: No results for input(s): COLORURINE, LABSPEC, PHURINE, GLUCOSEU, HGBUR, BILIRUBINUR, KETONESUR, PROTEINUR, UROBILINOGEN, NITRITE, LEUKOCYTESUR in the last 72 hours.  Invalid input(s): APPERANCEUR    Imaging: DG Chest 2 View  Result Date: 04/10/2021 CLINICAL DATA:  80 year old female with chest and epigastric pain for 2 weeks. EXAM: CHEST - 2 VIEW COMPARISON:  Chest radiographs 06/27/2020 and earlier. FINDINGS: Right chest dialysis catheter removed since last year. Lower lung volumes on both views. Stable borderline to mild cardiomegaly. Other mediastinal contours are within normal limits. Visualized tracheal air column is within normal limits. No pneumothorax, pleural effusion or consolidation. Indistinct  pulmonary vasculature compared to last year. No acute osseous abnormality identified. Negative visible bowel gas pattern. IMPRESSION: Lower lung volumes with pulmonary vasculature congestion but no overt edema. Electronically Signed   By: Genevie Ann M.D.   On: 04/10/2021 10:05   ECHOCARDIOGRAM COMPLETE  Result Date: 04/11/2021    ECHOCARDIOGRAM REPORT   Patient Name:   Leah Davies Date of Exam: 04/10/2021 Medical Rec #:  [id number]     Height:       57.0 in Accession #:    [id number]    Weight:       167.0 lb Date of Birth:  [birth year]      BSA:          1.666 m Patient Age:    56 years      BP:           162/50 mmHg Patient Gender: F             HR:           58 bpm. Exam Location:  [facility] Procedure: 2D Echo, Cardiac Doppler and Color Doppler Indications:     Chest pain R07.9  History:         Patient has prior history of Echocardiogram examinations, most                  recent 10/08/2016. Risk  Factors:Hypertension. CKD,end stage renal                  disease.  Sonographer:     Sherrie Sport RDCS (AE) Referring Phys:  [id number] Collier Bullock Diagnosing Phys: Bartholome Bill MD  Sonographer Comments: Suboptimal apical window. IMPRESSIONS  1. Left ventricular ejection fraction, by estimation, is 65 to 70%. The left ventricle has normal function. The left ventricle has no regional wall motion abnormalities. There is mild left ventricular hypertrophy. Left ventricular diastolic parameters were normal.  2. Right ventricular systolic function is normal. The right ventricular size is normal.  3. Left atrial size was mildly dilated.  4. The mitral valve is grossly normal. Trivial mitral valve regurgitation.  5. The aortic valve is grossly normal. Aortic valve regurgitation is not visualized. FINDINGS  Left Ventricle: Left ventricular ejection fraction, by estimation, is 65 to 70%. The left ventricle has normal function. The left ventricle has no regional wall motion abnormalities. The left ventricular internal cavity size was  normal in size. There is  mild left ventricular hypertrophy. Left ventricular diastolic parameters were normal. Right Ventricle: The right ventricular size is normal. No increase in right ventricular wall thickness. Right ventricular systolic function is normal. Left Atrium: Left atrial size was mildly dilated. Right Atrium: Right atrial size was normal in size. Pericardium: There is no evidence of pericardial effusion. Mitral Valve: The mitral valve is grossly normal. Trivial mitral valve regurgitation. Tricuspid Valve: The tricuspid valve is grossly normal. Tricuspid valve regurgitation is trivial. Aortic Valve: The aortic valve is grossly normal. Aortic valve regurgitation is not visualized. Aortic valve mean gradient measures 5.0 mmHg. Aortic valve peak gradient measures 11.7 mmHg. Aortic valve area, by VTI measures 2.25 cm. Pulmonic Valve: The pulmonic valve was not well visualized. Pulmonic valve regurgitation is trivial. Aorta: The aortic root is normal in size and structure. IAS/Shunts: The atrial septum is grossly normal.  LEFT VENTRICLE PLAX 2D LVIDd:         4.49 cm  Diastology LVIDs:         2.63 cm  LV e' medial:    5.77 cm/s LV PW:         1.06 cm  LV E/e' medial:  13.6 LV IVS:        0.94 cm  LV e' lateral:   4.68 cm/s LVOT diam:     2.00 cm  LV E/e' lateral: 16.8 LV SV:         101 LV SV Index:   60 LVOT Area:     3.14 cm  RIGHT VENTRICLE RV Basal diam:  2.94 cm RV S prime:     12.20 cm/s TAPSE (M-mode): 3.5 cm LEFT ATRIUM             Index       RIGHT ATRIUM           Index LA diam:        4.30 cm 2.58 cm/m  RA Area:     10.20 cm LA Vol (A2C):   55.9 ml 33.55 ml/m RA Volume:   20.60 ml  12.37 ml/m LA Vol (A4C):   44.7 ml 26.83 ml/m LA Biplane Vol: 51.2 ml 30.73 ml/m  AORTIC VALVE                    PULMONIC VALVE AV Area (Vmax):    2.02 cm     PV Vmax:        0.54 m/s  AV Area (Vmean):   2.46 cm     PV Peak grad:   1.1 mmHg AV Area (VTI):     2.25 cm     RVOT Peak grad: 1 mmHg AV Vmax:            171.00 cm/s AV Vmean:          106.000 cm/s AV VTI:            0.447 m AV Peak Grad:      11.7 mmHg AV Mean Grad:      5.0 mmHg LVOT Vmax:         110.00 cm/s LVOT Vmean:        83.100 cm/s LVOT VTI:          0.320 m LVOT/AV VTI ratio: 0.72  AORTA Ao Root diam: 2.30 cm MITRAL VALVE                TRICUSPID VALVE MV Area (PHT): 3.13 cm     TR Peak grad:   31.8 mmHg MV Decel Time: 242 msec     TR Vmax:        282.00 cm/s MV E velocity: 78.40 cm/s MV A velocity: 105.00 cm/s  SHUNTS MV E/A ratio:  0.75         Systemic VTI:  0.32 m                             Systemic Diam: 2.00 cm Bartholome Bill MD Electronically signed by Bartholome Bill MD Signature Date/Time: 04/11/2021/7:40:49 AM    Final      Medications:     aspirin EC  81 mg Oral Daily   atorvastatin  40 mg Oral Daily   Chlorhexidine Gluconate Cloth  6 each Topical Q0600   heparin injection (subcutaneous)  5,000 Units Subcutaneous Q8H   hydrALAZINE  25 mg Oral BID   isosorbide mononitrate  30 mg Oral Daily   metoprolol succinate  25 mg Oral Daily   pantoprazole  40 mg Oral Daily   acetaminophen, ondansetron (ZOFRAN) IV  Assessment/ Plan:  Leah Davies is a 80 y.o.  female with medical history including hypertension, GERD and ESRD on dialysis. She presents to ED with complaints of chest pain for 2 weeks.   [facility]/MWF/Lt AVF/66 kg  Hypertension 158/45 Not well controlled. Home regimen includes Hydralazine, Isosorbide, and losartan.  Currently receiving Hydralazine, Isosorbide and Metoprolol.  2. End stage renal disease on dialysis  Will maintain outpatient schedule during admission  Scheduled to receive dialysis today, no UF based on recent history of nausea and vomiting  HD access infiltrated during initiation of dialysis. Will not continue today. Rest and ice access. Plan to dialyze tomorrow.  Will contact vascular to evaluate access. Made NPO for possible fistulogram.  3. Anemia of chronic kidney  disease  Lab Results  Component Value Date   HGB 10.2 (L) 04/10/2021  Hgb at goal EPO outpatient Will monitor   4. Secondary Hyperparathyroidism:   Lab Results  Component Value Date   PTH 336 (H) 04/10/2020   CALCIUM 9.2 04/10/2021   CAION 0.78 (LL) 07/28/2020   PHOS 4.1 04/10/2020    Phosphorus and calcium at goal No binders at this time   LOS: 0 Jakhari Space 7/13/20229:22 AM

## 2021-04-11 NOTE — Op Note (Signed)
[facility]    OPERATIVE NOTE   PROCEDURE: 1.   Left brachiocephalic arteriovenous fistula cannulation under ultrasound guidance 2.   Left arm fistulagram including central venogram   PRE-OPERATIVE DIAGNOSIS: 1. ESRD 2. Poorly functional left arm brachiocephalic AVF with multiple recent infiltrations  POST-OPERATIVE DIAGNOSIS: same as above   SURGEON: Leotis Pain, MD  ANESTHESIA: local with MCS  ESTIMATED BLOOD LOSS: 3 cc  FINDING(S):  mild area of narrowing in the 30 to 40% range that had been treated previously that was more severe several months ago.  This did not appear hemodynamically significant.  There was some mild to moderate tortuosity in the upper arm cephalic vein.  The area of infiltration did not demonstrate any large pseudoaneurysm or continued bleeding.  The cephalic vein subclavian vein confluence was widely patent as was the central venous circulation.   SPECIMEN(S):  None  CONTRAST: 15 cc  FLUORO TIME: 0.1 minutes  MODERATE CONSCIOUS SEDATION TIME: Approximately 11 minutes with 1 mg of Versed and 50 mcg of Fentanyl   INDICATIONS: Leah Davies is a 80 y.o. female who presents with malfunctioning left brachiocephalic arteriovenous fistula.  She has had a second infiltration of this fistula in recent weeks.  The patient is scheduled for left arm fistulagram.  The patient is aware the risks include but are not limited to: bleeding, infection, thrombosis of the cannulated access, and possible anaphylactic reaction to the contrast.  The patient is aware of the risks of the procedure and elects to proceed forward.  DESCRIPTION: After full informed written consent was obtained, the patient was brought back to the angiography suite and placed supine upon the angiography table.  The patient was connected to monitoring equipment. Moderate conscious sedation was administered with a face to face encounter with the patient throughout the procedure with  my supervision of the RN administering medicines and monitoring the patient's vital signs and mental status throughout from the start of the procedure until the patient was taken to the recovery room. The left arm was prepped and draped in the standard fashion for a percutaneous access intervention.  Under ultrasound guidance, the left brachiocephalic arteriovenous fistula was cannulated with a micropuncture needle under direct ultrasound guidance where it was patent and a permanent image was performed.  The microwire was advanced into the fistula and the needle was exchanged for the a microsheath. Hand injections were completed to image the access including the central venous system. This demonstrated a mild area of narrowing in the 30 to 40% range that had been treated previously that was more severe several months ago.  This did not appear hemodynamically significant.  There was some mild to moderate tortuosity in the upper arm cephalic vein.  The area of infiltration did not demonstrate any large pseudoaneurysm or continued bleeding.  The cephalic vein subclavian vein confluence was widely patent as was the central venous circulation.  Based on the images, this patient will need no endovascular intervention.  If there is still unable to stick her fistula, consideration for a surgical revision to make it more superficial will be considered.    Based on the completion imaging, no further intervention is necessary.  The wire and balloon were removed from the sheath.  A 4-0 Monocryl purse-string suture was sewn around the sheath.  The sheath was removed while tying down the suture.  A sterile bandage was applied to the puncture site.  COMPLICATIONS: None  CONDITION: Stable   Leotis Pain  04/11/2021 2:35 PM   This note was created with Dragon Medical transcription system. Any errors in dictation are purely unintentional.

## 2021-04-11 NOTE — Progress Notes (Signed)
PROGRESS NOTE    Leah Davies  [id number] DOB: [birth year] DOA: 04/10/2021 PCP: Gladstone Lighter, MD   Chief complaint.  Chest pain. Brief Narrative:  Leah Davies is a 80 y.o. female with medical history significant for end-stage renal disease on hemodialysis, dialysis days on Monday/Wednesday/Friday, hypertension who presents to the emergency room for evaluation of chest pain which she has had intermittently for 2 weeks. Peak troponin 279.  She is seen by cardiology, believes chest pain is noncardiac.  Elevated troponin due to end-stage renal disease. However, patient was a found to have mild functional AV fistula.  Not able to complete dialysis today.  Vascular surgery consult obtained.   Assessment & Plan:   Principal Problem:   Chest pain Active Problems:   Essential hypertension   ESRD (end stage renal disease) (HCC)   Gastro-esophageal reflux disease without esophagitis   Obesity (BMI 30-39.9)  #1.  Chest pain. Elevated troponin secondary to end-stage renal disease. Patient chest pain is better, appreciate cardiology consult.  No additional work-up is indicated.  2.  End-stage renal disease. Nonfunctional AV fistula. Vascular surgery consult obtained.  Patient will be dialyzed again tomorrow after AV fistula malfunction repaired.   #3 essential hypertension plan Continue home medicines.    DVT prophylaxis: Heparin Code Status: full Family Communication: Daughter is at bedside Disposition Plan:    Status is: Observation    Dispo: The patient is from: Home              Anticipated d/c is to: Home              Patient currently is not medically stable to d/c.   Difficult to place patient No        No intake/output data recorded. No intake/output data recorded.     Consultants:  Nephrology and vascular  Procedures: HD  Antimicrobials: None Subjective: Seen patient was assessed in person Spanish interpreter. Patient has a malfunctioning AV  fistula, hemodialysis could not completed. Otherwise patient doing well, denies any additional chest pain.  No short of breath No dysuria hematuria  No fever or chills. No abdominal pain or nausea vomiting.  Objective: Vitals:   04/11/21 0736 04/11/21 0759 04/11/21 1032 04/11/21 1216  BP: (!) 158/45  (!) 164/47 (!) 155/48  Pulse: (!) 56  70 (!) 58  Resp: 18 (!) '21 17 17  '$ Temp: 98.5 F (36.9 C)  97.9 F (36.6 C) 98 F (36.7 C)  TempSrc: Oral   Oral  SpO2: 99%  99% 99%  Weight:      Height:        Intake/Output Summary (Last 24 hours) at 04/11/2021 1315 Last data filed at 04/11/2021 0958 Gross per 24 hour  Intake --  Output 0 ml  Net 0 ml   Filed Weights   04/10/21 0922 04/10/21 1432  Weight: 75.8 kg 75.8 kg    Examination:  General exam: Appears calm and comfortable  Respiratory system: Clear to auscultation. Respiratory effort normal. Cardiovascular system: S1 & S2 heard, RRR. No JVD, murmurs, rubs, gallops or clicks. No pedal edema. Gastrointestinal system: Abdomen is nondistended, soft and nontender. No organomegaly or masses felt. Normal bowel sounds heard. Central nervous system: Alert and oriented. No focal neurological deficits. Extremities: Symmetric 5 x 5 power. Skin: No rashes, lesions or ulcers Psychiatry: Judgement and insight appear normal. Mood & affect appropriate.     Data Reviewed: I have personally reviewed following labs and imaging studies  CBC: Recent Labs  Lab 04/10/21 0924  WBC 6.6  HGB 10.2*  HCT 30.5*  MCV 95.0  PLT [id number]   Basic Metabolic Panel: Recent Labs  Lab 04/10/21 0934  NA 138  K 5.3*  CL 106  CO2 21*  GLUCOSE 108*  BUN 80*  CREATININE 7.13*  CALCIUM 9.2   GFR: Estimated Creatinine Clearance: 5.5 mL/min (A) (by C-G formula based on SCr of 7.13 mg/dL (H)). Liver Function Tests: Recent Labs  Lab 04/10/21 0934  AST 30  ALT 24  ALKPHOS 66  BILITOT 1.0  PROT 6.9  ALBUMIN 3.8   Recent Labs  Lab 04/10/21 0934   LIPASE 63*   No results for input(s): AMMONIA in the last 168 hours. Coagulation Profile: No results for input(s): INR, PROTIME in the last 168 hours. Cardiac Enzymes: No results for input(s): CKTOTAL, CKMB, CKMBINDEX, TROPONINI in the last 168 hours. BNP (last 3 results) No results for input(s): PROBNP in the last 8760 hours. HbA1C: No results for input(s): HGBA1C in the last 72 hours. CBG: No results for input(s): GLUCAP in the last 168 hours. Lipid Profile: No results for input(s): CHOL, HDL, LDLCALC, TRIG, CHOLHDL, LDLDIRECT in the last 72 hours. Thyroid Function Tests: No results for input(s): TSH, T4TOTAL, FREET4, T3FREE, THYROIDAB in the last 72 hours. Anemia Panel: No results for input(s): VITAMINB12, FOLATE, FERRITIN, TIBC, IRON, RETICCTPCT in the last 72 hours. Sepsis Labs: No results for input(s): PROCALCITON, LATICACIDVEN in the last 168 hours.  Recent Results (from the past 240 hour(s))  Resp Panel by RT-PCR (Flu A&B, Covid) Nasopharyngeal Swab     Status: None   Collection Time: 04/10/21 11:56 AM   Specimen: Nasopharyngeal Swab; Nasopharyngeal(NP) swabs in vial transport medium  Result Value Ref Range Status   SARS Coronavirus 2 by RT PCR NEGATIVE NEGATIVE Final    Comment: (NOTE) SARS-CoV-2 target nucleic acids are NOT DETECTED.  The SARS-CoV-2 RNA is generally detectable in upper respiratory specimens during the acute phase of infection. The lowest concentration of SARS-CoV-2 viral copies this assay can detect is 138 copies/mL. A negative result does not preclude SARS-Cov-2 infection and should not be used as the sole basis for treatment or other patient management decisions. A negative result may occur with  improper specimen collection/handling, submission of specimen other than nasopharyngeal swab, presence of viral mutation(s) within the areas targeted by this assay, and inadequate number of viral copies(<138 copies/mL). A negative result must be combined  with clinical observations, patient history, and epidemiological information. The expected result is Negative.  Fact Sheet for Patients:  EntrepreneurPulse.com.au  Fact Sheet for Healthcare Providers:  IncredibleEmployment.be  This test is no t yet approved or cleared by the [place] FDA and  has been authorized for detection and/or diagnosis of SARS-CoV-2 by FDA under an Emergency Use Authorization (EUA). This EUA will remain  in effect (meaning this test can be used) for the duration of the COVID-19 declaration under Section 564(b)(1) of the Act, 21 U.S.C.section 360bbb-3(b)(1), unless the authorization is terminated  or revoked sooner.       Influenza A by PCR NEGATIVE NEGATIVE Final   Influenza B by PCR NEGATIVE NEGATIVE Final    Comment: (NOTE) The Xpert Xpress SARS-CoV-2/FLU/RSV plus assay is intended as an aid in the diagnosis of influenza from Nasopharyngeal swab specimens and should not be used as a sole basis for treatment. Nasal washings and aspirates are unacceptable for Xpert Xpress SARS-CoV-2/FLU/RSV testing.  Fact Sheet for Patients: EntrepreneurPulse.com.au  Fact Sheet for Healthcare Providers:  IncredibleEmployment.be  This test is not yet approved or cleared by the [place] and has been authorized for detection and/or diagnosis of SARS-CoV-2 by FDA under an Emergency Use Authorization (EUA). This EUA will remain in effect (meaning this test can be used) for the duration of the COVID-19 declaration under Section 564(b)(1) of the Act, 21 U.S.C. section 360bbb-3(b)(1), unless the authorization is terminated or revoked.  Performed at [facility], [address] [place], [place] [address]          Radiology Studies: DG Chest 2 View  Result Date: 04/10/2021 CLINICAL DATA:  80 year old female with chest and epigastric pain for 2 weeks. EXAM: CHEST - 2 VIEW  COMPARISON:  Chest radiographs 06/27/2020 and earlier. FINDINGS: Right chest dialysis catheter removed since last year. Lower lung volumes on both views. Stable borderline to mild cardiomegaly. Other mediastinal contours are within normal limits. Visualized tracheal air column is within normal limits. No pneumothorax, pleural effusion or consolidation. Indistinct pulmonary vasculature compared to last year. No acute osseous abnormality identified. Negative visible bowel gas pattern. IMPRESSION: Lower lung volumes with pulmonary vasculature congestion but no overt edema. Electronically Signed   By: Genevie Ann M.D.   On: 04/10/2021 10:05   ECHOCARDIOGRAM COMPLETE  Result Date: 04/11/2021    ECHOCARDIOGRAM REPORT   Patient Name:   Leah Davies Date of Exam: 04/10/2021 Medical Rec #:  [id number]     Height:       57.0 in Accession #:    [id number]    Weight:       167.0 lb Date of Birth:  [birth year]      BSA:          1.666 m Patient Age:    17 years      BP:           162/50 mmHg Patient Gender: F             HR:           58 bpm. Exam Location:  [facility] Procedure: 2D Echo, Cardiac Doppler and Color Doppler Indications:     Chest pain R07.9  History:         Patient has prior history of Echocardiogram examinations, most                  recent 10/08/2016. Risk Factors:Hypertension. CKD,end stage renal                  disease.  Sonographer:     Sherrie Sport RDCS (AE) Referring Phys:  [id number] Collier Bullock Diagnosing Phys: Bartholome Bill MD  Sonographer Comments: Suboptimal apical window. IMPRESSIONS  1. Left ventricular ejection fraction, by estimation, is 65 to 70%. The left ventricle has normal function. The left ventricle has no regional wall motion abnormalities. There is mild left ventricular hypertrophy. Left ventricular diastolic parameters were normal.  2. Right ventricular systolic function is normal. The right ventricular size is normal.  3. Left atrial size was mildly dilated.  4. The mitral valve is grossly normal.  Trivial mitral valve regurgitation.  5. The aortic valve is grossly normal. Aortic valve regurgitation is not visualized. FINDINGS  Left Ventricle: Left ventricular ejection fraction, by estimation, is 65 to 70%. The left ventricle has normal function. The left ventricle has no regional wall motion abnormalities. The left ventricular internal cavity size was normal in size. There is  mild left ventricular hypertrophy. Left ventricular diastolic parameters were normal. Right Ventricle: The right  ventricular size is normal. No increase in right ventricular wall thickness. Right ventricular systolic function is normal. Left Atrium: Left atrial size was mildly dilated. Right Atrium: Right atrial size was normal in size. Pericardium: There is no evidence of pericardial effusion. Mitral Valve: The mitral valve is grossly normal. Trivial mitral valve regurgitation. Tricuspid Valve: The tricuspid valve is grossly normal. Tricuspid valve regurgitation is trivial. Aortic Valve: The aortic valve is grossly normal. Aortic valve regurgitation is not visualized. Aortic valve mean gradient measures 5.0 mmHg. Aortic valve peak gradient measures 11.7 mmHg. Aortic valve area, by VTI measures 2.25 cm. Pulmonic Valve: The pulmonic valve was not well visualized. Pulmonic valve regurgitation is trivial. Aorta: The aortic root is normal in size and structure. IAS/Shunts: The atrial septum is grossly normal.  LEFT VENTRICLE PLAX 2D LVIDd:         4.49 cm  Diastology LVIDs:         2.63 cm  LV e' medial:    5.77 cm/s LV PW:         1.06 cm  LV E/e' medial:  13.6 LV IVS:        0.94 cm  LV e' lateral:   4.68 cm/s LVOT diam:     2.00 cm  LV E/e' lateral: 16.8 LV SV:         101 LV SV Index:   60 LVOT Area:     3.14 cm  RIGHT VENTRICLE RV Basal diam:  2.94 cm RV S prime:     12.20 cm/s TAPSE (M-mode): 3.5 cm LEFT ATRIUM             Index       RIGHT ATRIUM           Index LA diam:        4.30 cm 2.58 cm/m  RA Area:     10.20 cm LA Vol  (A2C):   55.9 ml 33.55 ml/m RA Volume:   20.60 ml  12.37 ml/m LA Vol (A4C):   44.7 ml 26.83 ml/m LA Biplane Vol: 51.2 ml 30.73 ml/m  AORTIC VALVE                    PULMONIC VALVE AV Area (Vmax):    2.02 cm     PV Vmax:        0.54 m/s AV Area (Vmean):   2.46 cm     PV Peak grad:   1.1 mmHg AV Area (VTI):     2.25 cm     RVOT Peak grad: 1 mmHg AV Vmax:           171.00 cm/s AV Vmean:          106.000 cm/s AV VTI:            0.447 m AV Peak Grad:      11.7 mmHg AV Mean Grad:      5.0 mmHg LVOT Vmax:         110.00 cm/s LVOT Vmean:        83.100 cm/s LVOT VTI:          0.320 m LVOT/AV VTI ratio: 0.72  AORTA Ao Root diam: 2.30 cm MITRAL VALVE                TRICUSPID VALVE MV Area (PHT): 3.13 cm     TR Peak grad:   31.8 mmHg MV Decel Time: 242 msec     TR Vmax:  282.00 cm/s MV E velocity: 78.40 cm/s MV A velocity: 105.00 cm/s  SHUNTS MV E/A ratio:  0.75         Systemic VTI:  0.32 m                             Systemic Diam: 2.00 cm Bartholome Bill MD Electronically signed by Bartholome Bill MD Signature Date/Time: 04/11/2021/7:40:49 AM    Final         Scheduled Meds:  aspirin EC  81 mg Oral Daily   atorvastatin  40 mg Oral Daily   Chlorhexidine Gluconate Cloth  6 each Topical Q0600   heparin injection (subcutaneous)  5,000 Units Subcutaneous Q8H   hydrALAZINE  25 mg Oral BID   isosorbide mononitrate  30 mg Oral Daily   metoprolol succinate  25 mg Oral Daily   pantoprazole  40 mg Oral Daily   Continuous Infusions:   LOS: 0 days    Time spent: 26 minutes    Sharen Hones, MD [facility]   To contact the attending provider between 7A-7P or the covering provider during after hours 7P-7A, please log into the web site [email or web address] and access using universal  password for that web site. If you do not have the password, please call the hospital operator.  04/11/2021, 1:15 PM

## 2021-04-11 NOTE — Consult Note (Signed)
[facility] Vascular Consult Note  MRN : [id number]  Leah Davies is a 80 y.o. (1940/12/05) female who presents with chief complaint of  Chief Complaint  Patient presents with   Abdominal Pain   Chest Pain   History of Present Illness:  Leah Davies is a 80 year old female with medical history significant for end-stage renal disease on hemodialysis, dialysis days on Monday/Wednesday/Friday, hypertension who presents to the emergency room for evaluation of chest pain which she has had intermittently for two weeks.    Chest pain is mostly midsternal, and is described as a burning sensation with radiation to her left arm and jaw.  Chest pain is mostly at night but she denies having any nausea, no vomiting, no palpitations, no diaphoresis. She complains of shortness of breath but this appears to be chronic for her.   She denies having any cough, no fever, no chills, no dizziness, no lightheadedness, no headache, no blurred vision, no focal deficits, no changes in her bowel habits.  Patient notes progressively worsening issues with her left upper extremity dialysis access.  Patient notes hard cannulation stating that only 1 tachycardia for dialysis center is able to appropriately cannulate her.  Patient was infiltrated today during dialysis and unfortunately missed her treatment.  Denies any left hand pain.  Vascular surgery was consulted by Dr. Juleen China for possible endovascular intervention.  Current Facility-Administered Medications  Medication Dose Route Frequency Provider Last Rate Last Admin   acetaminophen (TYLENOL) tablet 650 mg  650 mg Oral Q4H PRN Agbata, Tochukwu, MD       aspirin EC tablet 81 mg  81 mg Oral Daily Agbata, Tochukwu, MD   81 mg at 04/11/21 1036   atorvastatin (LIPITOR) tablet 40 mg  40 mg Oral Daily Agbata, Tochukwu, MD   40 mg at 04/11/21 1036   Chlorhexidine Gluconate Cloth 2 % PADS 6 each  6 each Topical Q0600 Colon Flattery, NP   6 each  at 04/11/21 0525   heparin injection 5,000 Units  5,000 Units Subcutaneous Q8H Agbata, Tochukwu, MD   5,000 Units at 04/11/21 0525   hydrALAZINE (APRESOLINE) tablet 25 mg  25 mg Oral BID Agbata, Tochukwu, MD   25 mg at 04/11/21 1036   isosorbide mononitrate (IMDUR) 24 hr tablet 30 mg  30 mg Oral Daily Agbata, Tochukwu, MD   30 mg at 04/11/21 1036   metoprolol succinate (TOPROL-XL) 24 hr tablet 25 mg  25 mg Oral Daily Agbata, Tochukwu, MD   25 mg at 04/11/21 1036   ondansetron (ZOFRAN) injection 4 mg  4 mg Intravenous Q6H PRN Agbata, Tochukwu, MD       pantoprazole (PROTONIX) EC tablet 40 mg  40 mg Oral Daily Agbata, Tochukwu, MD   40 mg at 04/11/21 1036   Past Medical History:  Diagnosis Date   Chronic kidney disease    renal insufficiency   Complication of anesthesia    Vital signs low during colonoscopy had trouble to wake up    Elevated lipids    GERD (gastroesophageal reflux disease)    Gout    Hypertension    Past Surgical History:  Procedure Laterality Date   A/V FISTULAGRAM Left 08/22/2020   Procedure: A/V FISTULAGRAM;  Surgeon: Katha Cabal, MD;  Location: [facility];  Service: Cardiovascular;  Laterality: Left;   A/V FISTULAGRAM Left 01/02/2021   Procedure: A/V FISTULAGRAM;  Surgeon: Katha Cabal, MD;  Location: [facility];  Service:  Cardiovascular;  Laterality: Left;   AV FISTULA PLACEMENT Left 07/28/2020   Procedure: ARTERIOVENOUS (AV) FISTULA CREATION (BRACHIAL CEPHALIC );  Surgeon: Katha Cabal, MD;  Location: [facility];  Service: Vascular;  Laterality: Left;   BREAST EXCISIONAL BIOPSY Left 2010   benign   BREAST SURGERY     Lt breast biopsy   DIALYSIS/PERMA CATHETER INSERTION N/A 04/10/2020   Procedure: DIALYSIS/PERMA CATHETER INSERTION;  Surgeon: Algernon Huxley, MD;  Location: [facility];  Service: Cardiovascular;  Laterality: N/A;   DIALYSIS/PERMA CATHETER REMOVAL N/A 01/30/2021   Procedure: DIALYSIS/PERMA CATHETER REMOVAL;   Surgeon: Katha Cabal, MD;  Location: [facility];  Service: Cardiovascular;  Laterality: N/A;   KNEE ARTHROSCOPY Left 10/05/2015   Procedure: ARTHROSCOPY KNEE partial medial and lateral meniscectomy, lateral release for sublux patella;  Surgeon: Hessie Knows, MD;  Location: [facility];  Service: Orthopedics;  Laterality: Left;   Social History Social History   Tobacco Use   Smoking status: Never   Smokeless tobacco: Never  Vaping Use   Vaping Use: Never used  Substance Use Topics   Alcohol use: Not Currently    Comment: rare   Drug use: No   Family History Family History  Problem Relation Age of Onset   Breast cancer Sister 39  Denies family history of peripheral artery disease, venous disease or renal disease.  Allergies  Allergen Reactions   Amlodipine Hypertension    Caused increased blood pressure   Azithromycin Other (See Comments)    Unknown   REVIEW OF SYSTEMS (Negative unless checked)  Constitutional: '[]'$ Weight loss  '[]'$ Fever  '[]'$ Chills Cardiac: '[x]'$ Chest pain   '[]'$ Chest pressure   '[]'$ Palpitations   '[]'$ Shortness of breath when laying flat   '[]'$ Shortness of breath at rest   '[x]'$ Shortness of breath with exertion. Vascular:  '[]'$ Pain in legs with walking   '[]'$ Pain in legs at rest   '[]'$ Pain in legs when laying flat   '[]'$ Claudication   '[]'$ Pain in feet when walking  '[]'$ Pain in feet at rest  '[]'$ Pain in feet when laying flat   '[]'$ History of DVT   '[]'$ Phlebitis   '[]'$ Swelling in legs   '[]'$ Varicose veins   '[]'$ Non-healing ulcers Pulmonary:   '[]'$ Uses home oxygen   '[]'$ Productive cough   '[]'$ Hemoptysis   '[]'$ Wheeze  '[]'$ COPD   '[]'$ Asthma Neurologic:  '[]'$ Dizziness  '[]'$ Blackouts   '[]'$ Seizures   '[]'$ History of stroke   '[]'$ History of TIA  '[]'$ Aphasia   '[]'$ Temporary blindness   '[]'$ Dysphagia   '[]'$ Weakness or numbness in arms   '[]'$ Weakness or numbness in legs Musculoskeletal:  '[]'$ Arthritis   '[]'$ Joint swelling   '[]'$ Joint pain   '[]'$ Low back pain Hematologic:  '[x]'$ Easy bruising  '[]'$ Easy bleeding   '[]'$ Hypercoagulable state   '[x]'$ Anemic   '[]'$ Hepatitis Gastrointestinal:  '[]'$ Blood in stool   '[]'$ Vomiting blood  '[]'$ Gastroesophageal reflux/heartburn   '[]'$ Difficulty swallowing. Genitourinary:  '[x]'$ Chronic kidney disease   '[]'$ Difficult urination  '[]'$ Frequent urination  '[]'$ Burning with urination   '[]'$ Blood in urine Skin:  '[]'$ Rashes   '[]'$ Ulcers   '[]'$ Wounds Psychological:  '[]'$ History of anxiety   '[]'$  History of major depression.  Physical Examination  Vitals:   04/11/21 0736 04/11/21 0759 04/11/21 1032 04/11/21 1216  BP: (!) 158/45  (!) 164/47 (!) 155/48  Pulse: (!) 56  70 (!) 58  Resp: 18 (!) '21 17 17  '$ Temp: 98.5 F (36.9 C)  97.9 F (36.6 C) 98 F (36.7 C)  TempSrc: Oral   Oral  SpO2: 99%  99% 99%  Weight:  Height:       Body mass index is 36.14 kg/m. Gen:  WD/WN, NAD Head: [place]/AT, No temporalis wasting. Prominent temp pulse not noted. Ear/Nose/Throat: Hearing grossly intact, nares w/o erythema or drainage, oropharynx w/o Erythema/Exudate Eyes: Sclera non-icteric, conjunctiva clear Neck: Trachea midline.  No JVD.  Pulmonary:  Good air movement, respirations not labored, equal bilaterally.  Cardiac: RRR, normal S1, S2. Vascular:  Vessel Right Left  Radial Palpable Palpable  Ulnar Palpable Palpable                               Left Upper Extremity: Bruit and thrill noted to the access.  Extremity with some mild edema however soft.  Hand is warm.  There is no acute vascular compromise noted to the left upper extremity at this time.  Skin is intact.  Gastrointestinal: soft, non-tender/non-distended. No guarding/reflex.  Musculoskeletal: M/S 5/5 throughout.  Extremities without ischemic changes.  No deformity or atrophy. No edema. Neurologic: Sensation grossly intact in extremities.  Symmetrical.  Speech is fluent. Motor exam as listed above. Psychiatric: Judgment intact, Mood & affect appropriate for pt's clinical situation. Dermatologic: No rashes or ulcers noted.  No cellulitis or open wounds. Lymph : No Cervical,  Axillary, or Inguinal lymphadenopathy.  CBC Lab Results  Component Value Date   WBC 6.6 04/10/2021   HGB 10.2 (L) 04/10/2021   HCT 30.5 (L) 04/10/2021   MCV 95.0 04/10/2021   PLT 187 04/10/2021   BMET    Component Value Date/Time   NA 138 04/10/2021 0934   K 5.3 (H) 04/10/2021 0934   CL 106 04/10/2021 0934   CO2 21 (L) 04/10/2021 0934   GLUCOSE 108 (H) 04/10/2021 0934   BUN 80 (H) 04/10/2021 0934   CREATININE 7.13 (H) 04/10/2021 0934   CALCIUM 9.2 04/10/2021 0934   GFRNONAA 5 (L) 04/10/2021 0934   GFRAA 8 (L) 06/26/2020 1925   Estimated Creatinine Clearance: 5.5 mL/min (A) (by C-G formula based on SCr of 7.13 mg/dL (H)).  COAG Lab Results  Component Value Date   INR 1.0 07/25/2020   Radiology DG Chest 2 View  Result Date: 04/10/2021 CLINICAL DATA:  80 year old female with chest and epigastric pain for 2 weeks. EXAM: CHEST - 2 VIEW COMPARISON:  Chest radiographs 06/27/2020 and earlier. FINDINGS: Right chest dialysis catheter removed since last year. Lower lung volumes on both views. Stable borderline to mild cardiomegaly. Other mediastinal contours are within normal limits. Visualized tracheal air column is within normal limits. No pneumothorax, pleural effusion or consolidation. Indistinct pulmonary vasculature compared to last year. No acute osseous abnormality identified. Negative visible bowel gas pattern. IMPRESSION: Lower lung volumes with pulmonary vasculature congestion but no overt edema. Electronically Signed   By: Genevie Ann M.D.   On: 04/10/2021 10:05   ECHOCARDIOGRAM COMPLETE  Result Date: 04/11/2021    ECHOCARDIOGRAM REPORT   Patient Name:   Leah Davies Date of Exam: 04/10/2021 Medical Rec #:  [id number]     Height:       57.0 in Accession #:    [id number]    Weight:       167.0 lb Date of Birth:  1941/05/13      BSA:          1.666 m Patient Age:    79 years      BP:           162/50 mmHg Patient Gender: F  HR:           58 bpm. Exam Location:  [facility]  Procedure: 2D Echo, Cardiac Doppler and Color Doppler Indications:     Chest pain R07.9  History:         Patient has prior history of Echocardiogram examinations, most                  recent 10/08/2016. Risk Factors:Hypertension. CKD,end stage renal                  disease.  Sonographer:     Sherrie Sport RDCS (AE) Referring Phys:  [id number] Collier Bullock Diagnosing Phys: Bartholome Bill MD  Sonographer Comments: Suboptimal apical window. IMPRESSIONS  1. Left ventricular ejection fraction, by estimation, is 65 to 70%. The left ventricle has normal function. The left ventricle has no regional wall motion abnormalities. There is mild left ventricular hypertrophy. Left ventricular diastolic parameters were normal.  2. Right ventricular systolic function is normal. The right ventricular size is normal.  3. Left atrial size was mildly dilated.  4. The mitral valve is grossly normal. Trivial mitral valve regurgitation.  5. The aortic valve is grossly normal. Aortic valve regurgitation is not visualized. FINDINGS  Left Ventricle: Left ventricular ejection fraction, by estimation, is 65 to 70%. The left ventricle has normal function. The left ventricle has no regional wall motion abnormalities. The left ventricular internal cavity size was normal in size. There is  mild left ventricular hypertrophy. Left ventricular diastolic parameters were normal. Right Ventricle: The right ventricular size is normal. No increase in right ventricular wall thickness. Right ventricular systolic function is normal. Left Atrium: Left atrial size was mildly dilated. Right Atrium: Right atrial size was normal in size. Pericardium: There is no evidence of pericardial effusion. Mitral Valve: The mitral valve is grossly normal. Trivial mitral valve regurgitation. Tricuspid Valve: The tricuspid valve is grossly normal. Tricuspid valve regurgitation is trivial. Aortic Valve: The aortic valve is grossly normal. Aortic valve regurgitation is not visualized.  Aortic valve mean gradient measures 5.0 mmHg. Aortic valve peak gradient measures 11.7 mmHg. Aortic valve area, by VTI measures 2.25 cm. Pulmonic Valve: The pulmonic valve was not well visualized. Pulmonic valve regurgitation is trivial. Aorta: The aortic root is normal in size and structure. IAS/Shunts: The atrial septum is grossly normal.  LEFT VENTRICLE PLAX 2D LVIDd:         4.49 cm  Diastology LVIDs:         2.63 cm  LV e' medial:    5.77 cm/s LV PW:         1.06 cm  LV E/e' medial:  13.6 LV IVS:        0.94 cm  LV e' lateral:   4.68 cm/s LVOT diam:     2.00 cm  LV E/e' lateral: 16.8 LV SV:         101 LV SV Index:   60 LVOT Area:     3.14 cm  RIGHT VENTRICLE RV Basal diam:  2.94 cm RV S prime:     12.20 cm/s TAPSE (M-mode): 3.5 cm LEFT ATRIUM             Index       RIGHT ATRIUM           Index LA diam:        4.30 cm 2.58 cm/m  RA Area:     10.20 cm LA Vol (A2C):   55.9 ml 33.55 ml/m  RA Volume:   20.60 ml  12.37 ml/m LA Vol (A4C):   44.7 ml 26.83 ml/m LA Biplane Vol: 51.2 ml 30.73 ml/m  AORTIC VALVE                    PULMONIC VALVE AV Area (Vmax):    2.02 cm     PV Vmax:        0.54 m/s AV Area (Vmean):   2.46 cm     PV Peak grad:   1.1 mmHg AV Area (VTI):     2.25 cm     RVOT Peak grad: 1 mmHg AV Vmax:           171.00 cm/s AV Vmean:          106.000 cm/s AV VTI:            0.447 m AV Peak Grad:      11.7 mmHg AV Mean Grad:      5.0 mmHg LVOT Vmax:         110.00 cm/s LVOT Vmean:        83.100 cm/s LVOT VTI:          0.320 m LVOT/AV VTI ratio: 0.72  AORTA Ao Root diam: 2.30 cm MITRAL VALVE                TRICUSPID VALVE MV Area (PHT): 3.13 cm     TR Peak grad:   31.8 mmHg MV Decel Time: 242 msec     TR Vmax:        282.00 cm/s MV E velocity: 78.40 cm/s MV A velocity: 105.00 cm/s  SHUNTS MV E/A ratio:  0.75         Systemic VTI:  0.32 m                             Systemic Diam: 2.00 cm Bartholome Bill MD Electronically signed by Bartholome Bill MD Signature Date/Time: 04/11/2021/7:40:49 AM    Final      Assessment/Plan Leah Davies is a 80 year old female with medical history significant for end-stage renal disease on hemodialysis, dialysis days on Monday/Wednesday/Friday, hypertension who presents to the emergency room for evaluation of chest pain which she has had intermittently for two weeks.  During cardiac work-up, the patient was found to have difficulties dialyzing.  1.  End-Stage Renal Disease:  Patient is familiar to the service.  Known history of end-stage renal disease currently maintained by hemodialysis via a left upper extremity dialysis access.  Patient and her family who are at the bedside, notes issues with cannulation.  Daughter notes only one dialysis tech at her center is able to cannulate her.  Family notes that other techs find cannulation very difficult.  Patient was infiltrated during dialysis today and was unfortunately unable to complete her session.  Recommend the patient undergo a left upper extremity fistulogram with possible intervention and attempt to assess her anatomy as well as any stenosis/obstruction that may be contributing to her inability to dialyze appropriately.  Procedure, risks and benefits were explained to the patient.  All questions were answered.  The patient wishes to proceed.  We will plan on this this afternoon with Dr. Lucky Cowboy  2.  Anemia of Chronic Disease: This is followed by the patient's nephrologist and primary care physician. Hemoglobin this AM (10.2)  3.  Essential Hypertension: On appropriate medications. Encouraged good control as its slows the  progression of atherosclerotic disease   Discussed with Dr. Mayme Genta, PA-C  04/11/2021 1:36 PM  This note was created with Dragon medical transcription system.  Any error is purely unintentional.

## 2021-04-11 NOTE — Progress Notes (Signed)
Unable to treat patient today due to infiltration, LUA bruised, swollen, tender to touch. Patient will return next day for treatment.

## 2021-04-12 ENCOUNTER — Encounter: Payer: Self-pay | Admitting: Vascular Surgery

## 2021-04-12 DIAGNOSIS — N186 End stage renal disease: Secondary | ICD-10-CM | POA: Diagnosis not present

## 2021-04-12 DIAGNOSIS — R072 Precordial pain: Secondary | ICD-10-CM | POA: Diagnosis not present

## 2021-04-12 DIAGNOSIS — T82590A Other mechanical complication of surgically created arteriovenous fistula, initial encounter: Secondary | ICD-10-CM | POA: Diagnosis not present

## 2021-04-12 LAB — HEPATITIS B DNA, ULTRAQUANTITATIVE, PCR
HBV DNA SERPL PCR-ACNC: NOT DETECTED IU/mL
HBV DNA SERPL PCR-LOG IU: UNDETERMINED log10 IU/mL

## 2021-04-12 MED ORDER — ASPIRIN 81 MG PO TBEC: 81.0000 mg | DELAYED_RELEASE_TABLET | Freq: Every day | ORAL | 0 refills | Status: DC

## 2021-04-12 MED ORDER — ATORVASTATIN CALCIUM 40 MG PO TABS: 40.0000 mg | ORAL_TABLET | Freq: Every day | ORAL | 0 refills | Status: DC

## 2021-04-12 NOTE — Progress Notes (Signed)
Patient c/o pain in left arm with access on scale 0-10 pt stated 8. Per doctor patient was rinsed back and treatment ended with 43 min remaining. Patient is alert and oriented with no complaints.

## 2021-04-12 NOTE — Progress Notes (Signed)
[facility]  ROUNDING NOTE   Subjective:   Leah Davies  is a 80 y.o. female with medical history including hypertension, GERD and ESRD on dialysis. She presents to ED with complaints of chest pain for 2 weeks.   Patient is currently seen by [facility] and receives dialysis treatments at [facility] on MWF. She states she has a pain that starts in her stomach and rises to her chest.   Patient seen during dialysis   HEMODIALYSIS FLOWSHEET:  Blood Flow Rate (mL/min): 250 mL/min Arterial Pressure (mmHg): -130 mmHg Venous Pressure (mmHg): 170 mmHg Transmembrane Pressure (mmHg): 80 mmHg Ultrafiltration Rate (mL/min): 440 mL/min Dialysate Flow Rate (mL/min): 500 ml/min Conductivity: Machine : 13.8 Conductivity: Machine : 13.8 Dialysis Fluid Bolus: Normal Saline Bolus Amount (mL): 250 mL  Interpretor present during visit Continues to complain of soreness at HD access  Objective:  Vital signs in last 24 hours:  Temp:  [97.5 F (36.4 C)-98.9 F (37.2 C)] 98.1 F (36.7 C) (07/14 0908) Pulse Rate:  [53-64] 58 (07/14 0908) Resp:  [11-24] 14 (07/14 1100) BP: (116-176)/(41-74) 175/53 (07/14 1100) SpO2:  [96 %-100 %] 98 % (07/14 0748)  Weight change:  Filed Weights   04/10/21 0922 04/10/21 1432  Weight: 75.8 kg 75.8 kg    Intake/Output: I/O last 3 completed shifts: In: 521 [P.O.:510; I.V.:11] Out: 30 [Urine:30]   Intake/Output this shift:  No intake/output data recorded.  Physical Exam: General: NAD, resting in bed  Head: Normocephalic, atraumatic. dry oral mucosal membranes  Eyes: Anicteric  Lungs:  Clear to auscultation, normal effort  Heart: Regular rate and rhythm  Abdomen:  Soft, nontender  Extremities:  no peripheral edema.  Neurologic: Nonfocal, moving all four extremities  Skin: No lesions  Access: Lt AVF    Basic Metabolic Panel: Recent Labs  Lab 04/10/21 0934  NA 138  K 5.3*  CL 106  CO2 21*  GLUCOSE 108*  BUN 80*  CREATININE 7.13*   CALCIUM 9.2     Liver Function Tests: Recent Labs  Lab 04/10/21 0934  AST 30  ALT 24  ALKPHOS 66  BILITOT 1.0  PROT 6.9  ALBUMIN 3.8    Recent Labs  Lab 04/10/21 0934  LIPASE 63*    No results for input(s): AMMONIA in the last 168 hours.  CBC: Recent Labs  Lab 04/10/21 0924  WBC 6.6  HGB 10.2*  HCT 30.5*  MCV 95.0  PLT 187     Cardiac Enzymes: No results for input(s): CKTOTAL, CKMB, CKMBINDEX, TROPONINI in the last 168 hours.  BNP: Invalid input(s): POCBNP  CBG: No results for input(s): GLUCAP in the last 168 hours.  Microbiology: Results for orders placed or performed during the hospital encounter of 04/10/21  Resp Panel by RT-PCR (Flu A&B, Covid) Nasopharyngeal Swab     Status: None   Collection Time: 04/10/21 11:56 AM   Specimen: Nasopharyngeal Swab; Nasopharyngeal(NP) swabs in vial transport medium  Result Value Ref Range Status   SARS Coronavirus 2 by RT PCR NEGATIVE NEGATIVE Final    Comment: (NOTE) SARS-CoV-2 target nucleic acids are NOT DETECTED.  The SARS-CoV-2 RNA is generally detectable in upper respiratory specimens during the acute phase of infection. The lowest concentration of SARS-CoV-2 viral copies this assay can detect is 138 copies/mL. A negative result does not preclude SARS-Cov-2 infection and should not be used as the sole basis for treatment or other patient management decisions. A negative result may occur with  improper specimen collection/handling, submission  of specimen other than nasopharyngeal swab, presence of viral mutation(s) within the areas targeted by this assay, and inadequate number of viral copies(<138 copies/mL). A negative result must be combined with clinical observations, patient history, and epidemiological information. The expected result is Negative.  Fact Sheet for Patients:  EntrepreneurPulse.com.au  Fact Sheet for Healthcare Providers:   IncredibleEmployment.be  This test is no t yet approved or cleared by the [place] FDA and  has been authorized for detection and/or diagnosis of SARS-CoV-2 by FDA under an Emergency Use Authorization (EUA). This EUA will remain  in effect (meaning this test can be used) for the duration of the COVID-19 declaration under Section 564(b)(1) of the Act, 21 U.S.C.section 360bbb-3(b)(1), unless the authorization is terminated  or revoked sooner.       Influenza A by PCR NEGATIVE NEGATIVE Final   Influenza B by PCR NEGATIVE NEGATIVE Final    Comment: (NOTE) The Xpert Xpress SARS-CoV-2/FLU/RSV plus assay is intended as an aid in the diagnosis of influenza from Nasopharyngeal swab specimens and should not be used as a sole basis for treatment. Nasal washings and aspirates are unacceptable for Xpert Xpress SARS-CoV-2/FLU/RSV testing.  Fact Sheet for Patients: EntrepreneurPulse.com.au  Fact Sheet for Healthcare Providers: IncredibleEmployment.be  This test is not yet approved or cleared by the [place] FDA and has been authorized for detection and/or diagnosis of SARS-CoV-2 by FDA under an Emergency Use Authorization (EUA). This EUA will remain in effect (meaning this test can be used) for the duration of the COVID-19 declaration under Section 564(b)(1) of the Act, 21 U.S.C. section 360bbb-3(b)(1), unless the authorization is terminated or revoked.  Performed at [facility], [address] [place], [place] [address]     Coagulation Studies: No results for input(s): LABPROT, INR in the last 72 hours.  Urinalysis: No results for input(s): COLORURINE, LABSPEC, PHURINE, GLUCOSEU, HGBUR, BILIRUBINUR, KETONESUR, PROTEINUR, UROBILINOGEN, NITRITE, LEUKOCYTESUR in the last 72 hours.  Invalid input(s): APPERANCEUR    Imaging: PERIPHERAL VASCULAR CATHETERIZATION  Result Date: 04/11/2021 See surgical note for  result.  ECHOCARDIOGRAM COMPLETE  Result Date: 04/11/2021    ECHOCARDIOGRAM REPORT   Patient Name:   Leah Davies Date of Exam: 04/10/2021 Medical Rec #:  [id number]     Height:       57.0 in Accession #:    [id number]    Weight:       167.0 lb Date of Birth:  March 05, 1941      BSA:          1.666 m Patient Age:    73 years      BP:           162/50 mmHg Patient Gender: F             HR:           58 bpm. Exam Location:  [facility] Procedure: 2D Echo, Cardiac Doppler and Color Doppler Indications:     Chest pain R07.9  History:         Patient has prior history of Echocardiogram examinations, most                  recent 10/08/2016. Risk Factors:Hypertension. CKD,end stage renal                  disease.  Sonographer:     Sherrie Sport RDCS (AE) Referring Phys:  [id number] Collier Bullock Diagnosing Phys: Bartholome Bill MD  Sonographer Comments: Suboptimal apical window. IMPRESSIONS  1.  Left ventricular ejection fraction, by estimation, is 65 to 70%. The left ventricle has normal function. The left ventricle has no regional wall motion abnormalities. There is mild left ventricular hypertrophy. Left ventricular diastolic parameters were normal.  2. Right ventricular systolic function is normal. The right ventricular size is normal.  3. Left atrial size was mildly dilated.  4. The mitral valve is grossly normal. Trivial mitral valve regurgitation.  5. The aortic valve is grossly normal. Aortic valve regurgitation is not visualized. FINDINGS  Left Ventricle: Left ventricular ejection fraction, by estimation, is 65 to 70%. The left ventricle has normal function. The left ventricle has no regional wall motion abnormalities. The left ventricular internal cavity size was normal in size. There is  mild left ventricular hypertrophy. Left ventricular diastolic parameters were normal. Right Ventricle: The right ventricular size is normal. No increase in right ventricular wall thickness. Right ventricular systolic function is normal. Left  Atrium: Left atrial size was mildly dilated. Right Atrium: Right atrial size was normal in size. Pericardium: There is no evidence of pericardial effusion. Mitral Valve: The mitral valve is grossly normal. Trivial mitral valve regurgitation. Tricuspid Valve: The tricuspid valve is grossly normal. Tricuspid valve regurgitation is trivial. Aortic Valve: The aortic valve is grossly normal. Aortic valve regurgitation is not visualized. Aortic valve mean gradient measures 5.0 mmHg. Aortic valve peak gradient measures 11.7 mmHg. Aortic valve area, by VTI measures 2.25 cm. Pulmonic Valve: The pulmonic valve was not well visualized. Pulmonic valve regurgitation is trivial. Aorta: The aortic root is normal in size and structure. IAS/Shunts: The atrial septum is grossly normal.  LEFT VENTRICLE PLAX 2D LVIDd:         4.49 cm  Diastology LVIDs:         2.63 cm  LV e' medial:    5.77 cm/s LV PW:         1.06 cm  LV E/e' medial:  13.6 LV IVS:        0.94 cm  LV e' lateral:   4.68 cm/s LVOT diam:     2.00 cm  LV E/e' lateral: 16.8 LV SV:         101 LV SV Index:   60 LVOT Area:     3.14 cm  RIGHT VENTRICLE RV Basal diam:  2.94 cm RV S prime:     12.20 cm/s TAPSE (M-mode): 3.5 cm LEFT ATRIUM             Index       RIGHT ATRIUM           Index LA diam:        4.30 cm 2.58 cm/m  RA Area:     10.20 cm LA Vol (A2C):   55.9 ml 33.55 ml/m RA Volume:   20.60 ml  12.37 ml/m LA Vol (A4C):   44.7 ml 26.83 ml/m LA Biplane Vol: 51.2 ml 30.73 ml/m  AORTIC VALVE                    PULMONIC VALVE AV Area (Vmax):    2.02 cm     PV Vmax:        0.54 m/s AV Area (Vmean):   2.46 cm     PV Peak grad:   1.1 mmHg AV Area (VTI):     2.25 cm     RVOT Peak grad: 1 mmHg AV Vmax:           171.00 cm/s AV Vmean:  106.000 cm/s AV VTI:            0.447 m AV Peak Grad:      11.7 mmHg AV Mean Grad:      5.0 mmHg LVOT Vmax:         110.00 cm/s LVOT Vmean:        83.100 cm/s LVOT VTI:          0.320 m LVOT/AV VTI ratio: 0.72  AORTA Ao Root diam:  2.30 cm MITRAL VALVE                TRICUSPID VALVE MV Area (PHT): 3.13 cm     TR Peak grad:   31.8 mmHg MV Decel Time: 242 msec     TR Vmax:        282.00 cm/s MV E velocity: 78.40 cm/s MV A velocity: 105.00 cm/s  SHUNTS MV E/A ratio:  0.75         Systemic VTI:  0.32 m                             Systemic Diam: 2.00 cm Bartholome Bill MD Electronically signed by Bartholome Bill MD Signature Date/Time: 04/11/2021/7:40:49 AM    Final      Medications:     aspirin EC  81 mg Oral Daily   atorvastatin  40 mg Oral Daily   Chlorhexidine Gluconate Cloth  6 each Topical Q0600   heparin injection (subcutaneous)  5,000 Units Subcutaneous Q8H   hydrALAZINE  25 mg Oral BID   isosorbide mononitrate  30 mg Oral Daily   metoprolol succinate  25 mg Oral Daily   pantoprazole  40 mg Oral Daily   acetaminophen, HYDROmorphone (DILAUDID) injection, ondansetron (ZOFRAN) IV, ondansetron (ZOFRAN) IV  Assessment/ Plan:  Ms. Leah Davies is a 80 y.o.  female with medical history including hypertension, GERD and ESRD on dialysis. She presents to ED with complaints of chest pain for 2 weeks.   [facility]/MWF/Lt AVF/66 kg  Hypertension 175/53 Not well controlled. Home regimen includes Hydralazine, Isosorbide, and losartan.  Currently receiving Hydralazine, Isosorbide and Metoprolol.  2. End stage renal disease on dialysis  Will maintain outpatient schedule during admission  Dialysis not performed yesterday due to access infiltration Angiogram negative for significant stenosis Scheduled to receive dialysis today using size 16 needles Low BFR maintained due to curvature of access Next treatment scheduled for tomorrow, if she remains inpatient  3. Anemia of chronic kidney disease  Lab Results  Component Value Date   HGB 10.2 (L) 04/10/2021  Hgb at goal EPO outpatient Will monitor   4. Secondary Hyperparathyroidism:   Lab Results  Component Value Date   PTH 336 (H) 04/10/2020   CALCIUM 9.2  04/10/2021   CAION 0.78 (LL) 07/28/2020   PHOS 4.1 04/10/2020    Phosphorus and calcium at goal No binders at this time   LOS: 0 Shaleigh Laubscher 7/14/202211:36 AM

## 2021-04-12 NOTE — Progress Notes (Signed)
This RN utilized [place] Interpretation services to provide discharge instructions and teaching to the patient as well as the patient's family. The patient and the patient's family verbalized and demonstrated an understanding of the provided instructions. All outstanding questions resolved. R arm PIV removed. Cannula intact. Pt tolerated well. All belongings packed and in tow. Volunteer services to transport patient to private vehicle via wheelchair at time of departure.

## 2021-04-12 NOTE — Discharge Summary (Signed)
Physician Discharge Summary  Patient ID: Leah Davies MRN: [id number] DOB/AGE: Oct 23, 1940 80 y.o.  Admit date: 04/10/2021 Discharge date: 04/12/2021  Admission Diagnoses:  Discharge Diagnoses:  Principal Problem:   Chest pain Active Problems:   Essential hypertension   ESRD (end stage renal disease) (HCC)   Gastro-esophageal reflux disease without esophagitis   Obesity (BMI 30-39.9)   Discharged Condition: good  Hospital Course:  Leah Davies is a 80 y.o. female with medical history significant for end-stage renal disease on hemodialysis, dialysis days on Monday/Wednesday/Friday, hypertension who presents to the emergency room for evaluation of chest pain which she has had intermittently for 2 weeks. Peak troponin 279.  She is seen by cardiology, believes chest pain is noncardiac.  Elevated troponin due to end-stage renal disease. However, patient was a found to have mild functional AV fistula.  Not able to complete dialysis today.  Vascular surgery consult obtained.  #1.  Chest pain. Elevated troponin secondary to end-stage renal disease. Patient chest pain is better, appreciate cardiology consult.  No additional work-up is indicated.  2.  End-stage renal disease. Poor functional AV fistula. Patient had a left arm fistulogram performed by vascular surgery.  Showed mild narrowing of a 30 to 40%.  No intervention is needed. Discussed with nephrology, patient is seen stable to be discharged after hemodialysis.   #3. Essential hypertension. Continue home medicines.    Consults: nephrology and vascular surgery  Significant Diagnostic Studies:   Treatments: HD  Discharge Exam: Blood pressure (!) 160/52, pulse (!) 53, temperature (!) 97.5 F (36.4 C), resp. rate 15, height '4\' 9"'$  (1.448 m), weight 75.8 kg, SpO2 98 %. General appearance: alert and cooperative Resp: clear to auscultation bilaterally Cardio: regular rate and rhythm, S1, S2 normal, no murmur, click, rub or  gallop GI: soft, non-tender; bowel sounds normal; no masses,  no organomegaly Extremities: extremities normal, atraumatic, no cyanosis or edema  Disposition: Discharge disposition: 01-Home or Self Care       Discharge Instructions     Diet general   Complete by: As directed    Renal diet   Increase activity slowly   Complete by: As directed       Allergies as of 04/12/2021       Reactions   Amlodipine Hypertension   Caused increased blood pressure   Azithromycin Other (See Comments)   Unknown        Medication List     TAKE these medications    aspirin 81 MG EC tablet Take 1 tablet (81 mg total) by mouth daily. Swallow whole.   atorvastatin 40 MG tablet Commonly known as: LIPITOR Take 1 tablet (40 mg total) by mouth daily.   hydrALAZINE 25 MG tablet Commonly known as: APRESOLINE Take 25 mg by mouth in the morning and at bedtime. 1100 and 1900   isosorbide mononitrate 30 MG 24 hr tablet Commonly known as: IMDUR Take 1 tablet (30 mg total) by mouth daily. What changed: additional instructions   losartan 50 MG tablet Commonly known as: COZAAR Take 50 mg by mouth daily.   metoprolol succinate 25 MG 24 hr tablet Commonly known as: TOPROL-XL Take 25 mg by mouth daily.   omeprazole 20 MG capsule Commonly known as: PRILOSEC Take 20 mg by mouth daily as needed (reflux).        Follow-up Information     Gladstone Lighter, MD Follow up in 1 week(s).   Specialty: Internal Medicine Contact information: [address] [place] [address]  [phone]                 Signed: Sharen Hones 04/12/2021, 9:15 AM

## 2021-04-12 NOTE — Progress Notes (Signed)
Spoke with Shae in central telemetry at 0859 to transfer patient from room 259 to [place]

## 2021-04-12 NOTE — Plan of Care (Signed)
  Problem: Education: Goal: Knowledge of General Education information will improve Description: Including pain rating scale, medication(s)/side effects and non-pharmacologic comfort measures 04/12/2021 0919 by Cristela Blue, RN Outcome: Adequate for Discharge 04/12/2021 0919 by Cristela Blue, RN Outcome: Adequate for Discharge   Problem: Health Behavior/Discharge Planning: Goal: Ability to manage health-related needs will improve 04/12/2021 0919 by Cristela Blue, RN Outcome: Adequate for Discharge 04/12/2021 0919 by Cristela Blue, RN Outcome: Adequate for Discharge   Problem: Clinical Measurements: Goal: Ability to maintain clinical measurements within normal limits will improve 04/12/2021 0919 by Cristela Blue, RN Outcome: Adequate for Discharge 04/12/2021 0919 by Cristela Blue, RN Outcome: Adequate for Discharge Goal: Will remain free from infection 04/12/2021 0919 by Cristela Blue, RN Outcome: Adequate for Discharge 04/12/2021 0919 by Cristela Blue, RN Outcome: Adequate for Discharge Goal: Diagnostic test results will improve 04/12/2021 0919 by Cristela Blue, RN Outcome: Adequate for Discharge 04/12/2021 0919 by Cristela Blue, RN Outcome: Adequate for Discharge Goal: Respiratory complications will improve 04/12/2021 0919 by Cristela Blue, RN Outcome: Adequate for Discharge 04/12/2021 0919 by Cristela Blue, RN Outcome: Adequate for Discharge Goal: Cardiovascular complication will be avoided 04/12/2021 0919 by Cristela Blue, RN Outcome: Adequate for Discharge 04/12/2021 0919 by Cristela Blue, RN Outcome: Adequate for Discharge   Problem: Activity: Goal: Risk for activity intolerance will decrease 04/12/2021 0919 by Cristela Blue, RN Outcome: Adequate for Discharge 04/12/2021 0919 by Cristela Blue, RN Outcome: Adequate for Discharge   Problem: Nutrition: Goal: Adequate nutrition will be maintained 04/12/2021 0919 by Cristela Blue, RN Outcome: Adequate for  Discharge 04/12/2021 0919 by Cristela Blue, RN Outcome: Adequate for Discharge   Problem: Coping: Goal: Level of anxiety will decrease 04/12/2021 0919 by Cristela Blue, RN Outcome: Adequate for Discharge 04/12/2021 0919 by Cristela Blue, RN Outcome: Adequate for Discharge   Problem: Elimination: Goal: Will not experience complications related to bowel motility 04/12/2021 0919 by Cristela Blue, RN Outcome: Adequate for Discharge 04/12/2021 0919 by Cristela Blue, RN Outcome: Adequate for Discharge Goal: Will not experience complications related to urinary retention 04/12/2021 0919 by Cristela Blue, RN Outcome: Adequate for Discharge 04/12/2021 0919 by Cristela Blue, RN Outcome: Adequate for Discharge   Problem: Pain Managment: Goal: General experience of comfort will improve 04/12/2021 0919 by Cristela Blue, RN Outcome: Adequate for Discharge 04/12/2021 0919 by Cristela Blue, RN Outcome: Adequate for Discharge   Problem: Safety: Goal: Ability to remain free from injury will improve 04/12/2021 0919 by Cristela Blue, RN Outcome: Adequate for Discharge 04/12/2021 0919 by Cristela Blue, RN Outcome: Adequate for Discharge   Problem: Skin Integrity: Goal: Risk for impaired skin integrity will decrease 04/12/2021 0919 by Cristela Blue, RN Outcome: Adequate for Discharge 04/12/2021 0919 by Cristela Blue, RN Outcome: Adequate for Discharge

## 2021-04-19 ENCOUNTER — Ambulatory Visit (INDEPENDENT_AMBULATORY_CARE_PROVIDER_SITE_OTHER): Payer: Medicare HMO

## 2021-04-19 ENCOUNTER — Other Ambulatory Visit: Payer: Self-pay

## 2021-04-19 ENCOUNTER — Ambulatory Visit (INDEPENDENT_AMBULATORY_CARE_PROVIDER_SITE_OTHER): Payer: Medicare HMO | Admitting: Vascular Surgery

## 2021-04-19 VITALS — BP 142/63 | HR 63 | Ht 60.0 in | Wt 146.0 lb

## 2021-04-19 DIAGNOSIS — N186 End stage renal disease: Secondary | ICD-10-CM

## 2021-04-19 DIAGNOSIS — I10 Essential (primary) hypertension: Secondary | ICD-10-CM

## 2021-04-19 DIAGNOSIS — T829XXS Unspecified complication of cardiac and vascular prosthetic device, implant and graft, sequela: Secondary | ICD-10-CM

## 2021-04-19 DIAGNOSIS — E782 Mixed hyperlipidemia: Secondary | ICD-10-CM

## 2021-04-19 DIAGNOSIS — K219 Gastro-esophageal reflux disease without esophagitis: Secondary | ICD-10-CM | POA: Diagnosis not present

## 2021-04-19 NOTE — Progress Notes (Signed)
MRN : [id number]  Leah Davies is a 80 y.o. (07/08/1941) female who presents with chief complaint of  Chief Complaint  Patient presents with   Follow-up    [id number] .  History of Present Illness:   The patient returns to the office for followup status post angiography of the dialysis access on 04/11/2021.  At the time of angiography her fistula was noted to be widely patent.  There is approximately 30% stenosis at the previously treated area this is unchanged.  The fistula itself remains mildly tortuous but not profoundly so.  Angiography was performed after the patient sustained a moderate size hematoma during dialysis.  Since the angiogram they have continued using her fistula for her hemodialysis access.  The patient states that there are 2 people at the center that have no problem with cannulation but pretty much everybody else has not been able to reliably cannulate the fistula.  She notes the hematoma is not nearly as painful and seems to be going down.  The patient denies an increase in arm swelling. At the present time the patient denies hand pain.  The patient denies amaurosis fugax or recent TIA symptoms. There are no recent neurological changes noted. The patient denies claudication symptoms or rest pain symptoms. The patient denies history of DVT, PE or superficial thrombophlebitis. The patient denies recent episodes of angina or shortness of breath.   Duplex ultrasound of the AV fistula obtained today demonstrates uniform velocities.  Previously noted stenosis remains improved status post the intervention.  Overall the fistula appears to be between 5 mm and 10 mm deep to the surface of the skin  Current Meds  Medication Sig   aspirin 81 MG EC tablet Take 1 tablet by mouth daily.   aspirin EC 81 MG EC tablet Take 1 tablet (81 mg total) by mouth daily. Swallow whole.   atorvastatin (LIPITOR) 40 MG tablet Take 1 tablet (40 mg total) by mouth daily.   atorvastatin (LIPITOR) 40 MG  tablet Take 1 tablet by mouth daily.   hydrALAZINE (APRESOLINE) 25 MG tablet Take 25 mg by mouth in the morning and at bedtime. 1100 and 1900   isosorbide mononitrate (IMDUR) 30 MG 24 hr tablet Take 1 tablet (30 mg total) by mouth daily.   losartan (COZAAR) 50 MG tablet Take 50 mg by mouth daily.   metoprolol succinate (TOPROL-XL) 25 MG 24 hr tablet Take 25 mg by mouth daily.   omeprazole (PRILOSEC) 20 MG capsule Take 20 mg by mouth daily as needed (reflux).     Past Medical History:  Diagnosis Date   Chronic kidney disease    renal insufficiency   Complication of anesthesia    Vital signs low during colonoscopy had trouble to wake up    Elevated lipids    GERD (gastroesophageal reflux disease)    Gout    Hypertension     Past Surgical History:  Procedure Laterality Date   A/V FISTULAGRAM Left 08/22/2020   Procedure: A/V FISTULAGRAM;  Surgeon: SKatha Cabal MD;  [facility];  Service: Cardiovascular;  Laterality: Left;   A/V FISTULAGRAM Left 01/02/2021   Procedure: A/V FISTULAGRAM;  Surgeon: SKatha Cabal MD;  [facility];  Service: Cardiovascular;  Laterality: Left;   A/V FISTULAGRAM Left 04/11/2021   Procedure: A/V Fistulagram;  Surgeon: DAlgernon Huxley MD;  [facility];  Service: Cardiovascular;  Laterality: Left;   AV FISTULA PLACEMENT Left  07/28/2020   Procedure: ARTERIOVENOUS (AV) FISTULA CREATION (BRACHIAL CEPHALIC );  Surgeon: Katha Cabal, MD;  Location: [facility];  Service: Vascular;  Laterality: Left;   BREAST EXCISIONAL BIOPSY Left 2010   benign   BREAST SURGERY     Lt breast biopsy   DIALYSIS/PERMA CATHETER INSERTION N/A 04/10/2020   Procedure: DIALYSIS/PERMA CATHETER INSERTION;  Surgeon: Algernon Huxley, MD;  Location: [facility];  Service: Cardiovascular;  Laterality: N/A;   DIALYSIS/PERMA CATHETER REMOVAL N/A 01/30/2021   Procedure: DIALYSIS/PERMA CATHETER REMOVAL;  Surgeon: Katha Cabal, MD;  Location: [facility];  Service: Cardiovascular;  Laterality: N/A;   KNEE ARTHROSCOPY Left 10/05/2015   Procedure: ARTHROSCOPY KNEE partial medial and lateral meniscectomy, lateral release for sublux patella;  Surgeon: Hessie Knows, MD;  Location: [facility];  Service: Orthopedics;  Laterality: Left;    Social History Social History   Tobacco Use   Smoking status: Never   Smokeless tobacco: Never  Vaping Use   Vaping Use: Never used  Substance Use Topics   Alcohol use: Not Currently    Comment: rare   Drug use: No    Family History Family History  Problem Relation Age of Onset   Breast cancer Sister 62    Allergies  Allergen Reactions   Amlodipine Hypertension    Caused increased blood pressure   Azithromycin Other (See Comments)    Unknown   Prednisone Other (See Comments)    Pt states she feels like her BP dropped, got dizzy, and very pale.   Tizanidine Other (See Comments)    Dizziness, pt states BP dropped, and very pale.      REVIEW OF SYSTEMS (Negative unless checked)  Constitutional: '[]'$ Weight loss  '[]'$ Fever  '[]'$ Chills Cardiac: '[]'$ Chest pain   '[]'$ Chest pressure   '[]'$ Palpitations   '[]'$ Shortness of breath when laying flat   '[]'$ Shortness of breath with exertion. Vascular:  '[]'$ Pain in legs with walking   '[]'$ Pain in legs at rest  '[]'$ History of DVT   '[]'$ Phlebitis   '[]'$ Swelling in legs   '[]'$ Varicose veins   '[]'$ Non-healing ulcers Pulmonary:   '[]'$ Uses home oxygen   '[]'$ Productive cough   '[]'$ Hemoptysis   '[]'$ Wheeze  '[]'$ COPD   '[]'$ Asthma Neurologic:  '[]'$ Dizziness   '[]'$ Seizures   '[]'$ History of stroke   '[]'$ History of TIA  '[]'$ Aphasia   '[]'$ Vissual changes   '[]'$ Weakness or numbness in arm   '[]'$ Weakness or numbness in leg Musculoskeletal:   '[]'$ Joint swelling   '[]'$ Joint pain   '[]'$ Low back pain Hematologic:  '[]'$ Easy bruising  '[]'$ Easy bleeding   '[]'$ Hypercoagulable state   '[]'$ Anemic Gastrointestinal:  '[]'$ Diarrhea   '[]'$ Vomiting  '[x]'$ Gastroesophageal reflux/heartburn   '[]'$ Difficulty swallowing. Genitourinary:   '[x]'$ Chronic kidney disease   '[]'$ Difficult urination  '[]'$ Frequent urination   '[]'$ Blood in urine Skin:  '[]'$ Rashes   '[]'$ Ulcers  Psychological:  '[]'$ History of anxiety   '[]'$  History of major depression.  Physical Examination  Vitals:   04/19/21 1036  BP: (!) 142/63  Pulse: 63  Weight: 146 lb (66.2 kg)  Height: 5' (1.524 m)   Body mass index is 28.51 kg/m. Gen: WD/WN, NAD Head: [place]/AT, No temporalis wasting.  Ear/Nose/Throat: Hearing grossly intact, nares w/o erythema or drainage Eyes: PER, EOMI, sclera nonicteric.  Neck: Supple, no large masses.   Pulmonary:  Good air movement, no audible wheezing bilaterally, no use of accessory muscles.  Cardiac: RRR, no JVD Vascular:   Hematoma left arm approximately 6 to 8 cm in diameter.  It remains somewhat firm but  is nontender.  The brachiocephalic fistula of the left arm has an excellent thrill excellent bruit.  It is easily palpable in the area from the antecubital fossa to the hematoma. Vessel Right Left  Radial Palpable Palpable  Brachial Palpable Palpable  Gastrointestinal: Non-distended. No guarding/no peritoneal signs.  Musculoskeletal: M/S 5/5 throughout.  No deformity or atrophy.  Neurologic: CN 2-12 intact. Symmetrical.  Speech is fluent. Motor exam as listed above. Psychiatric: Judgment intact, Mood & affect appropriate for pt's clinical situation. Dermatologic: No rashes or ulcers noted.  No changes consistent with cellulitis.  CBC Lab Results  Component Value Date   WBC 6.6 04/10/2021   HGB 10.2 (L) 04/10/2021   HCT 30.5 (L) 04/10/2021   MCV 95.0 04/10/2021   PLT 187 04/10/2021    BMET    Component Value Date/Time   NA 138 04/10/2021 0934   K 5.3 (H) 04/10/2021 0934   CL 106 04/10/2021 0934   CO2 21 (L) 04/10/2021 0934   GLUCOSE 108 (H) 04/10/2021 0934   BUN 80 (H) 04/10/2021 0934   CREATININE 7.13 (H) 04/10/2021 0934   CALCIUM 9.2 04/10/2021 0934   GFRNONAA 5 (L) 04/10/2021 0934   GFRAA 8 (L) 06/26/2020 1925   Estimated  Creatinine Clearance: 5.5 mL/min (A) (by C-G formula based on SCr of 7.13 mg/dL (H)).  COAG Lab Results  Component Value Date   INR 1.0 07/25/2020    Radiology DG Chest 2 View  Result Date: 04/10/2021 CLINICAL DATA:  80 year old female with chest and epigastric pain for 2 weeks. EXAM: CHEST - 2 VIEW COMPARISON:  Chest radiographs 06/27/2020 and earlier. FINDINGS: Right chest dialysis catheter removed since last year. Lower lung volumes on both views. Stable borderline to mild cardiomegaly. Other mediastinal contours are within normal limits. Visualized tracheal air column is within normal limits. No pneumothorax, pleural effusion or consolidation. Indistinct pulmonary vasculature compared to last year. No acute osseous abnormality identified. Negative visible bowel gas pattern. IMPRESSION: Lower lung volumes with pulmonary vasculature congestion but no overt edema. Electronically Signed   By: Genevie Ann M.D.   On: 04/10/2021 10:05   PERIPHERAL VASCULAR CATHETERIZATION  Result Date: 04/11/2021 See surgical note for result.  ECHOCARDIOGRAM COMPLETE  Result Date: 04/11/2021    ECHOCARDIOGRAM REPORT   Patient Name:   KEYANNAH BOODOO Date of Exam: 04/10/2021 Medical Rec #:  [id number]     Height:       57.0 in Accession #:    [id number]    Weight:       167.0 lb Date of Birth:  1941/03/13      BSA:          1.666 m Patient Age:    69 years      BP:           162/50 mmHg Patient Gender: F             HR:           58 bpm. Exam Location:  [facility] Procedure: 2D Echo, Cardiac Doppler and Color Doppler Indications:     Chest pain R07.9  History:         Patient has prior history of Echocardiogram examinations, most                  recent 10/08/2016. Risk Factors:Hypertension. CKD,end stage renal                  disease.  Sonographer:     Sherrie Sport  RDCS (AE) Referring Phys:  [id number] AGBATA Diagnosing Phys: Bartholome Bill MD  Sonographer Comments: Suboptimal apical window. IMPRESSIONS  1. Left ventricular  ejection fraction, by estimation, is 65 to 70%. The left ventricle has normal function. The left ventricle has no regional wall motion abnormalities. There is mild left ventricular hypertrophy. Left ventricular diastolic parameters were normal.  2. Right ventricular systolic function is normal. The right ventricular size is normal.  3. Left atrial size was mildly dilated.  4. The mitral valve is grossly normal. Trivial mitral valve regurgitation.  5. The aortic valve is grossly normal. Aortic valve regurgitation is not visualized. FINDINGS  Left Ventricle: Left ventricular ejection fraction, by estimation, is 65 to 70%. The left ventricle has normal function. The left ventricle has no regional wall motion abnormalities. The left ventricular internal cavity size was normal in size. There is  mild left ventricular hypertrophy. Left ventricular diastolic parameters were normal. Right Ventricle: The right ventricular size is normal. No increase in right ventricular wall thickness. Right ventricular systolic function is normal. Left Atrium: Left atrial size was mildly dilated. Right Atrium: Right atrial size was normal in size. Pericardium: There is no evidence of pericardial effusion. Mitral Valve: The mitral valve is grossly normal. Trivial mitral valve regurgitation. Tricuspid Valve: The tricuspid valve is grossly normal. Tricuspid valve regurgitation is trivial. Aortic Valve: The aortic valve is grossly normal. Aortic valve regurgitation is not visualized. Aortic valve mean gradient measures 5.0 mmHg. Aortic valve peak gradient measures 11.7 mmHg. Aortic valve area, by VTI measures 2.25 cm. Pulmonic Valve: The pulmonic valve was not well visualized. Pulmonic valve regurgitation is trivial. Aorta: The aortic root is normal in size and structure. IAS/Shunts: The atrial septum is grossly normal.  LEFT VENTRICLE PLAX 2D LVIDd:         4.49 cm  Diastology LVIDs:         2.63 cm  LV e' medial:    5.77 cm/s LV PW:          1.06 cm  LV E/e' medial:  13.6 LV IVS:        0.94 cm  LV e' lateral:   4.68 cm/s LVOT diam:     2.00 cm  LV E/e' lateral: 16.8 LV SV:         101 LV SV Index:   60 LVOT Area:     3.14 cm  RIGHT VENTRICLE RV Basal diam:  2.94 cm RV S prime:     12.20 cm/s TAPSE (M-mode): 3.5 cm LEFT ATRIUM             Index       RIGHT ATRIUM           Index LA diam:        4.30 cm 2.58 cm/m  RA Area:     10.20 cm LA Vol (A2C):   55.9 ml 33.55 ml/m RA Volume:   20.60 ml  12.37 ml/m LA Vol (A4C):   44.7 ml 26.83 ml/m LA Biplane Vol: 51.2 ml 30.73 ml/m  AORTIC VALVE                    PULMONIC VALVE AV Area (Vmax):    2.02 cm     PV Vmax:        0.54 m/s AV Area (Vmean):   2.46 cm     PV Peak grad:   1.1 mmHg AV Area (VTI):     2.25 cm  RVOT Peak grad: 1 mmHg AV Vmax:           171.00 cm/s AV Vmean:          106.000 cm/s AV VTI:            0.447 m AV Peak Grad:      11.7 mmHg AV Mean Grad:      5.0 mmHg LVOT Vmax:         110.00 cm/s LVOT Vmean:        83.100 cm/s LVOT VTI:          0.320 m LVOT/AV VTI ratio: 0.72  AORTA Ao Root diam: 2.30 cm MITRAL VALVE                TRICUSPID VALVE MV Area (PHT): 3.13 cm     TR Peak grad:   31.8 mmHg MV Decel Time: 242 msec     TR Vmax:        282.00 cm/s MV E velocity: 78.40 cm/s MV A velocity: 105.00 cm/s  SHUNTS MV E/A ratio:  0.75         Systemic VTI:  0.32 m                             Systemic Diam: 2.00 cm Bartholome Bill MD Electronically signed by Bartholome Bill MD Signature Date/Time: 04/11/2021/7:40:49 AM    Final      Assessment/Plan 1. Complication from renal dialysis device, sequela Recommend:   The patient is doing okay and currently has a patent dialysis access which some of the technicians at the dialysis center are having difficulty cannulating. Flow pattern is stable when compared to the prior ultrasound and recent angiography does not demonstrate any hemodynamically significant issues.  I have had a long discussion with the patient and her son regarding  superficialization.  We discussed the fact that even though it is a good fistula if they are having significant difficulties with cannulation this will only lead to recurrent problem such as infiltrations and hematomas.  We discussed the process with superficialization which would require an incision overlying the fistula and then straightening the fistula and bringing the fistula essentially up to the surface just under the skin.  This would eliminate the depth making cannulation easier as well as straighten the fistula itself making cannulation easier since there will be any tortuosity.  After discussing this and answering her questions she would like to continue in the current status as there are 2 people that seem to have no problem whatsoever with cannulation.  I have elected then see her back in 6 weeks without any studies to see how the center has been doing and revisit whether superficialization is going to be needed.  Patient agrees with this.    2. ESRD (end stage renal disease) ([place]) At the present time the patient has adequate dialysis access.   Continue hemodialysis as ordered without interruption.  Avoid nephrotoxic medications and dehydration.  Further plans per nephrology   3. Essential hypertension Continue antihypertensive medications as already ordered, these medications have been reviewed and there are no changes at this time.     4. Chronic venous insufficiency No surgery or intervention at this point in time.     I have had a long discussion with the patient regarding venous insufficiency and why it  causes symptoms. I have discussed with the patient the chronic skin changes that accompany venous insufficiency and  the long term sequela such as infection and ulceration.  Patient will begin wearing graduated compression stockings class 1 (20-30 mmHg) or compression wraps on a daily basis a prescription was given. The patient will put the stockings on first thing in the morning  and removing them in the evening. The patient is instructed specifically not to sleep in the stockings.     In addition, behavioral modification including several periods of elevation of the lower extremities during the day will be continued. I have demonstrated that proper elevation is a position with the ankles at heart level.   The patient is instructed to begin routine exercise, especially walking on a daily basis  Hortencia Pilar, MD  04/19/2021 10:41 AM

## 2021-04-20 ENCOUNTER — Encounter (INDEPENDENT_AMBULATORY_CARE_PROVIDER_SITE_OTHER): Payer: Self-pay | Admitting: Vascular Surgery

## 2021-05-31 ENCOUNTER — Ambulatory Visit (INDEPENDENT_AMBULATORY_CARE_PROVIDER_SITE_OTHER): Payer: Medicare HMO | Admitting: Vascular Surgery

## 2021-05-31 ENCOUNTER — Other Ambulatory Visit: Payer: Self-pay

## 2021-05-31 ENCOUNTER — Encounter (INDEPENDENT_AMBULATORY_CARE_PROVIDER_SITE_OTHER): Payer: Self-pay | Admitting: Vascular Surgery

## 2021-05-31 VITALS — BP 168/84 | HR 60 | Ht 59.0 in | Wt 145.0 lb

## 2021-05-31 DIAGNOSIS — K219 Gastro-esophageal reflux disease without esophagitis: Secondary | ICD-10-CM

## 2021-05-31 DIAGNOSIS — I10 Essential (primary) hypertension: Secondary | ICD-10-CM

## 2021-05-31 DIAGNOSIS — N186 End stage renal disease: Secondary | ICD-10-CM | POA: Diagnosis not present

## 2021-05-31 DIAGNOSIS — T829XXS Unspecified complication of cardiac and vascular prosthetic device, implant and graft, sequela: Secondary | ICD-10-CM | POA: Diagnosis not present

## 2021-05-31 DIAGNOSIS — E782 Mixed hyperlipidemia: Secondary | ICD-10-CM

## 2021-05-31 NOTE — Progress Notes (Signed)
MRN : [id number]  Leah Davies is a 80 y.o. (10/21/1940) female who presents with chief complaint of check access.  History of Present Illness:   The patient returns to the office for followup status post angiography of the dialysis access on 04/11/2021.  At the time of angiography her fistula was noted to be widely patent.    Since the angiogram they have continued using her fistula for her hemodialysis access.  The patient states that there are now 3 people at the center that have no problem with cannulation.  She notes the previous hematoma is essentially gone.  The patient denies an increase in arm swelling. At the present time the patient denies hand pain.   The patient denies amaurosis fugax or recent TIA symptoms. There are no recent neurological changes noted. The patient denies claudication symptoms or rest pain symptoms. The patient denies history of DVT, PE or superficial thrombophlebitis. The patient denies recent episodes of angina or shortness of breath.    Previous duplex ultrasound of the AV fistula obtained today demonstrates uniform velocities.  Previously noted stenosis remains improved status post the intervention.  Overall the fistula appears to be between 5 mm and 10 mm deep to the surface of the skin    No outpatient medications have been marked as taking for the 05/31/21 encounter (Appointment) with Delana Meyer, Dolores Lory, MD.    Past Medical History:  Diagnosis Date   Chronic kidney disease    renal insufficiency   Complication of anesthesia    Vital signs low during colonoscopy had trouble to wake up    Elevated lipids    GERD (gastroesophageal reflux disease)    Gout    Hypertension     Past Surgical History:  Procedure Laterality Date   A/V FISTULAGRAM Left 08/22/2020   Procedure: A/V FISTULAGRAM;  Surgeon: Katha Cabal, MD;  Location: [place] [facility];  Service: Cardiovascular;  Laterality: Left;   A/V FISTULAGRAM Left 01/02/2021   Procedure:  A/V FISTULAGRAM;  Surgeon: Katha Cabal, MD;  Location: [place] [facility];  Service: Cardiovascular;  Laterality: Left;   A/V FISTULAGRAM Left 04/11/2021   Procedure: A/V Fistulagram;  Surgeon: Algernon Huxley, MD;  Location: [place] [facility];  Service: Cardiovascular;  Laterality: Left;   AV FISTULA PLACEMENT Left 07/28/2020   Procedure: ARTERIOVENOUS (AV) FISTULA CREATION (BRACHIAL CEPHALIC );  Surgeon: Katha Cabal, MD;  Location: [facility];  Service: Vascular;  Laterality: Left;   BREAST EXCISIONAL BIOPSY Left 2010   benign   BREAST SURGERY     Lt breast biopsy   DIALYSIS/PERMA CATHETER INSERTION N/A 04/10/2020   Procedure: DIALYSIS/PERMA CATHETER INSERTION;  Surgeon: Algernon Huxley, MD;  Location: [place] [facility];  Service: Cardiovascular;  Laterality: N/A;   DIALYSIS/PERMA CATHETER REMOVAL N/A 01/30/2021   Procedure: DIALYSIS/PERMA CATHETER REMOVAL;  Surgeon: Katha Cabal, MD;  Location: [place] [facility];  Service: Cardiovascular;  Laterality: N/A;   KNEE ARTHROSCOPY Left 10/05/2015   Procedure: ARTHROSCOPY KNEE partial medial and lateral meniscectomy, lateral release for sublux patella;  Surgeon: Hessie Knows, MD;  Location: [facility];  Service: Orthopedics;  Laterality: Left;    Social History Social History   Tobacco Use   Smoking status: Never   Smokeless tobacco: Never  Vaping Use   Vaping Use: Never used  Substance Use Topics   Alcohol use: Not Currently    Comment: rare   Drug use: No    Family History  Family History  Problem Relation Age of Onset   Breast cancer Sister 64    Allergies  Allergen Reactions   Amlodipine Hypertension    Caused increased blood pressure   Azithromycin Other (See Comments)    Unknown   Prednisone Other (See Comments)    Pt states she feels like her BP dropped, got dizzy, and very pale.   Tizanidine Other (See Comments)    Dizziness, pt states BP dropped, and very pale.      REVIEW OF SYSTEMS (Negative  unless checked)  Constitutional: '[]'$ Weight loss  '[]'$ Fever  '[]'$ Chills Cardiac: '[]'$ Chest pain   '[]'$ Chest pressure   '[]'$ Palpitations   '[]'$ Shortness of breath when laying flat   '[]'$ Shortness of breath with exertion. Vascular:  '[]'$ Pain in legs with walking   '[]'$ Pain in legs at rest  '[]'$ History of DVT   '[]'$ Phlebitis   '[]'$ Swelling in legs   '[]'$ Varicose veins   '[]'$ Non-healing ulcers Pulmonary:   '[]'$ Uses home oxygen   '[]'$ Productive cough   '[]'$ Hemoptysis   '[]'$ Wheeze  '[]'$ COPD   '[]'$ Asthma Neurologic:  '[]'$ Dizziness   '[]'$ Seizures   '[]'$ History of stroke   '[]'$ History of TIA  '[]'$ Aphasia   '[]'$ Vissual changes   '[]'$ Weakness or numbness in arm   '[]'$ Weakness or numbness in leg Musculoskeletal:   '[]'$ Joint swelling   '[]'$ Joint pain   '[]'$ Low back pain Hematologic:  '[]'$ Easy bruising  '[]'$ Easy bleeding   '[]'$ Hypercoagulable state   '[]'$ Anemic Gastrointestinal:  '[]'$ Diarrhea   '[]'$ Vomiting  '[x]'$ Gastroesophageal reflux/heartburn   '[]'$ Difficulty swallowing. Genitourinary:  '[x]'$ Chronic kidney disease   '[]'$ Difficult urination  '[]'$ Frequent urination   '[]'$ Blood in urine Skin:  '[]'$ Rashes   '[]'$ Ulcers  Psychological:  '[]'$ History of anxiety   '[]'$  History of major depression.  Physical Examination  There were no vitals filed for this visit. There is no height or weight on file to calculate BMI. Gen: WD/WN, NAD Head: [place]/AT, No temporalis wasting.  Ear/Nose/Throat: Hearing grossly intact, nares w/o erythema or drainage Eyes: PER, EOMI, sclera nonicteric.  Neck: Supple, no gross masses or lesions.  No JVD.  Pulmonary:  Good air movement, no audible wheezing, no use of accessory muscles.  Cardiac: RRR, precordium non-hyperdynamic. Vascular:     left arm AV fistula good thrill and good bruit Vessel Right Left  Radial Palpable Palpable  Brachial Palpable Palpable  Gastrointestinal: soft, non-distended. No guarding/no peritoneal signs.  Musculoskeletal: M/S 5/5 throughout.  No deformity.  Neurologic: CN 2-12 intact. Pain and light touch intact in extremities.  Symmetrical.  Speech is  fluent. Motor exam as listed above. Psychiatric: Judgment intact, Mood & affect appropriate for pt's clinical situation. Dermatologic: No rashes or ulcers noted.  No changes consistent with cellulitis.   CBC Lab Results  Component Value Date   WBC 6.6 04/10/2021   HGB 10.2 (L) 04/10/2021   HCT 30.5 (L) 04/10/2021   MCV 95.0 04/10/2021   PLT 187 04/10/2021    BMET    Component Value Date/Time   NA 138 04/10/2021 0934   K 5.3 (H) 04/10/2021 0934   CL 106 04/10/2021 0934   CO2 21 (L) 04/10/2021 0934   GLUCOSE 108 (H) 04/10/2021 0934   BUN 80 (H) 04/10/2021 0934   CREATININE 7.13 (H) 04/10/2021 0934   CALCIUM 9.2 04/10/2021 0934   GFRNONAA 5 (L) 04/10/2021 0934   GFRAA 8 (L) 06/26/2020 1925   CrCl cannot be calculated (Patient's most recent lab result is older than the maximum 21 days allowed.).  COAG Lab Results  Component Value Date  INR 1.0 07/25/2020    Radiology No results found.   Assessment/Plan 1. 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 The patient's dialysis center is not reporting any access issues. Flow pattern is stable when compared to the prior ultrasound.  The patient should have a duplex ultrasound of the dialysis access in 3 months.  The patient will follow-up with me in the office after each ultrasound  - VAS [place] (AVF, AVG); Future  2. ESRD (end stage renal disease) ([place]) Recommend:  The patient is doing well and currently has adequate dialysis access. The patient's dialysis center is not reporting any access issues. Flow pattern is stable when compared to the prior ultrasound.  The patient should have a duplex ultrasound of the dialysis access in 3 months.  The patient will follow-up with me in the office after each ultrasound    - VAS [place] (AVF, AVG); Future  3. Essential hypertension Continue  antihypertensive medications as already ordered, these medications have been reviewed and there are no changes at this time.   4. Gastroesophageal reflux disease, unspecified whether esophagitis present Continue PPI as already ordered, this medication has been reviewed and there are no changes at this time.  Avoidence of caffeine and alcohol  Moderate elevation of the head of the bed    5. Mixed hyperlipidemia Continue statin as ordered and reviewed, no changes at this time     Hortencia Pilar, MD  05/31/2021 11:35 AM

## 2021-08-01 DIAGNOSIS — N186 End stage renal disease: Secondary | ICD-10-CM | POA: Diagnosis not present

## 2021-08-01 DIAGNOSIS — Z992 Dependence on renal dialysis: Secondary | ICD-10-CM | POA: Diagnosis not present

## 2021-08-03 DIAGNOSIS — Z992 Dependence on renal dialysis: Secondary | ICD-10-CM | POA: Diagnosis not present

## 2021-08-03 DIAGNOSIS — N186 End stage renal disease: Secondary | ICD-10-CM | POA: Diagnosis not present

## 2021-08-06 DIAGNOSIS — Z992 Dependence on renal dialysis: Secondary | ICD-10-CM | POA: Diagnosis not present

## 2021-08-06 DIAGNOSIS — N186 End stage renal disease: Secondary | ICD-10-CM | POA: Diagnosis not present

## 2021-08-08 DIAGNOSIS — N186 End stage renal disease: Secondary | ICD-10-CM | POA: Diagnosis not present

## 2021-08-08 DIAGNOSIS — Z992 Dependence on renal dialysis: Secondary | ICD-10-CM | POA: Diagnosis not present

## 2021-08-13 DIAGNOSIS — Z992 Dependence on renal dialysis: Secondary | ICD-10-CM | POA: Diagnosis not present

## 2021-08-13 DIAGNOSIS — N186 End stage renal disease: Secondary | ICD-10-CM | POA: Diagnosis not present

## 2021-08-14 ENCOUNTER — Other Ambulatory Visit: Payer: Self-pay | Admitting: Internal Medicine

## 2021-08-14 DIAGNOSIS — I120 Hypertensive chronic kidney disease with stage 5 chronic kidney disease or end stage renal disease: Secondary | ICD-10-CM | POA: Diagnosis not present

## 2021-08-14 DIAGNOSIS — D638 Anemia in other chronic diseases classified elsewhere: Secondary | ICD-10-CM | POA: Diagnosis not present

## 2021-08-14 DIAGNOSIS — Z780 Asymptomatic menopausal state: Secondary | ICD-10-CM | POA: Diagnosis not present

## 2021-08-14 DIAGNOSIS — N186 End stage renal disease: Secondary | ICD-10-CM | POA: Diagnosis not present

## 2021-08-14 DIAGNOSIS — Z992 Dependence on renal dialysis: Secondary | ICD-10-CM | POA: Diagnosis not present

## 2021-08-14 DIAGNOSIS — Z1211 Encounter for screening for malignant neoplasm of colon: Secondary | ICD-10-CM | POA: Diagnosis not present

## 2021-08-14 DIAGNOSIS — Z1231 Encounter for screening mammogram for malignant neoplasm of breast: Secondary | ICD-10-CM

## 2021-08-14 DIAGNOSIS — R739 Hyperglycemia, unspecified: Secondary | ICD-10-CM | POA: Diagnosis not present

## 2021-08-14 DIAGNOSIS — Z1389 Encounter for screening for other disorder: Secondary | ICD-10-CM | POA: Diagnosis not present

## 2021-08-14 DIAGNOSIS — Z0000 Encounter for general adult medical examination without abnormal findings: Secondary | ICD-10-CM | POA: Diagnosis not present

## 2021-08-15 DIAGNOSIS — Z992 Dependence on renal dialysis: Secondary | ICD-10-CM | POA: Diagnosis not present

## 2021-08-15 DIAGNOSIS — N186 End stage renal disease: Secondary | ICD-10-CM | POA: Diagnosis not present

## 2021-08-17 DIAGNOSIS — Z992 Dependence on renal dialysis: Secondary | ICD-10-CM | POA: Diagnosis not present

## 2021-08-17 DIAGNOSIS — N186 End stage renal disease: Secondary | ICD-10-CM | POA: Diagnosis not present

## 2021-08-21 DIAGNOSIS — N186 End stage renal disease: Secondary | ICD-10-CM | POA: Diagnosis not present

## 2021-08-21 DIAGNOSIS — Z992 Dependence on renal dialysis: Secondary | ICD-10-CM | POA: Diagnosis not present

## 2021-08-24 DIAGNOSIS — N186 End stage renal disease: Secondary | ICD-10-CM | POA: Diagnosis not present

## 2021-08-24 DIAGNOSIS — Z992 Dependence on renal dialysis: Secondary | ICD-10-CM | POA: Diagnosis not present

## 2021-08-27 DIAGNOSIS — N186 End stage renal disease: Secondary | ICD-10-CM | POA: Diagnosis not present

## 2021-08-27 DIAGNOSIS — Z992 Dependence on renal dialysis: Secondary | ICD-10-CM | POA: Diagnosis not present

## 2021-08-28 DIAGNOSIS — Z1211 Encounter for screening for malignant neoplasm of colon: Secondary | ICD-10-CM | POA: Diagnosis not present

## 2021-08-29 DIAGNOSIS — Z992 Dependence on renal dialysis: Secondary | ICD-10-CM | POA: Diagnosis not present

## 2021-08-29 DIAGNOSIS — N186 End stage renal disease: Secondary | ICD-10-CM | POA: Diagnosis not present

## 2021-08-29 DIAGNOSIS — I120 Hypertensive chronic kidney disease with stage 5 chronic kidney disease or end stage renal disease: Secondary | ICD-10-CM | POA: Diagnosis not present

## 2021-08-30 ENCOUNTER — Encounter (INDEPENDENT_AMBULATORY_CARE_PROVIDER_SITE_OTHER): Payer: Medicare HMO

## 2021-08-30 ENCOUNTER — Ambulatory Visit (INDEPENDENT_AMBULATORY_CARE_PROVIDER_SITE_OTHER): Payer: Medicare HMO | Admitting: Nurse Practitioner

## 2021-08-31 DIAGNOSIS — N186 End stage renal disease: Secondary | ICD-10-CM | POA: Diagnosis not present

## 2021-08-31 DIAGNOSIS — Z992 Dependence on renal dialysis: Secondary | ICD-10-CM | POA: Diagnosis not present

## 2021-08-31 DIAGNOSIS — N2581 Secondary hyperparathyroidism of renal origin: Secondary | ICD-10-CM | POA: Diagnosis not present

## 2021-09-01 LAB — COLOGUARD: COLOGUARD: POSITIVE — AB

## 2021-09-03 DIAGNOSIS — N2581 Secondary hyperparathyroidism of renal origin: Secondary | ICD-10-CM | POA: Diagnosis not present

## 2021-09-03 DIAGNOSIS — N186 End stage renal disease: Secondary | ICD-10-CM | POA: Diagnosis not present

## 2021-09-03 DIAGNOSIS — Z992 Dependence on renal dialysis: Secondary | ICD-10-CM | POA: Diagnosis not present

## 2021-09-05 DIAGNOSIS — N186 End stage renal disease: Secondary | ICD-10-CM | POA: Diagnosis not present

## 2021-09-05 DIAGNOSIS — Z992 Dependence on renal dialysis: Secondary | ICD-10-CM | POA: Diagnosis not present

## 2021-09-05 DIAGNOSIS — N2581 Secondary hyperparathyroidism of renal origin: Secondary | ICD-10-CM | POA: Diagnosis not present

## 2021-09-07 DIAGNOSIS — Z992 Dependence on renal dialysis: Secondary | ICD-10-CM | POA: Diagnosis not present

## 2021-09-07 DIAGNOSIS — N2581 Secondary hyperparathyroidism of renal origin: Secondary | ICD-10-CM | POA: Diagnosis not present

## 2021-09-07 DIAGNOSIS — N186 End stage renal disease: Secondary | ICD-10-CM | POA: Diagnosis not present

## 2021-09-14 DIAGNOSIS — N2581 Secondary hyperparathyroidism of renal origin: Secondary | ICD-10-CM | POA: Diagnosis not present

## 2021-09-14 DIAGNOSIS — Z992 Dependence on renal dialysis: Secondary | ICD-10-CM | POA: Diagnosis not present

## 2021-09-14 DIAGNOSIS — N186 End stage renal disease: Secondary | ICD-10-CM | POA: Diagnosis not present

## 2021-09-17 DIAGNOSIS — N2581 Secondary hyperparathyroidism of renal origin: Secondary | ICD-10-CM | POA: Diagnosis not present

## 2021-09-17 DIAGNOSIS — N186 End stage renal disease: Secondary | ICD-10-CM | POA: Diagnosis not present

## 2021-09-17 DIAGNOSIS — Z992 Dependence on renal dialysis: Secondary | ICD-10-CM | POA: Diagnosis not present

## 2021-09-19 ENCOUNTER — Other Ambulatory Visit (INDEPENDENT_AMBULATORY_CARE_PROVIDER_SITE_OTHER): Payer: Self-pay | Admitting: Vascular Surgery

## 2021-09-19 DIAGNOSIS — N186 End stage renal disease: Secondary | ICD-10-CM | POA: Diagnosis not present

## 2021-09-19 DIAGNOSIS — Z992 Dependence on renal dialysis: Secondary | ICD-10-CM | POA: Diagnosis not present

## 2021-09-19 DIAGNOSIS — N2581 Secondary hyperparathyroidism of renal origin: Secondary | ICD-10-CM | POA: Diagnosis not present

## 2021-09-19 DIAGNOSIS — T829XXS Unspecified complication of cardiac and vascular prosthetic device, implant and graft, sequela: Secondary | ICD-10-CM

## 2021-09-20 ENCOUNTER — Ambulatory Visit (INDEPENDENT_AMBULATORY_CARE_PROVIDER_SITE_OTHER): Payer: Medicare HMO

## 2021-09-20 ENCOUNTER — Other Ambulatory Visit: Payer: Self-pay

## 2021-09-20 ENCOUNTER — Ambulatory Visit (INDEPENDENT_AMBULATORY_CARE_PROVIDER_SITE_OTHER): Payer: Medicare HMO | Admitting: Nurse Practitioner

## 2021-09-20 VITALS — BP 122/67 | HR 69 | Ht 59.0 in | Wt 145.5 lb

## 2021-09-20 DIAGNOSIS — T829XXS Unspecified complication of cardiac and vascular prosthetic device, implant and graft, sequela: Secondary | ICD-10-CM

## 2021-09-20 DIAGNOSIS — E782 Mixed hyperlipidemia: Secondary | ICD-10-CM

## 2021-09-20 DIAGNOSIS — N186 End stage renal disease: Secondary | ICD-10-CM | POA: Diagnosis not present

## 2021-09-20 DIAGNOSIS — I10 Essential (primary) hypertension: Secondary | ICD-10-CM | POA: Diagnosis not present

## 2021-09-21 DIAGNOSIS — Z992 Dependence on renal dialysis: Secondary | ICD-10-CM | POA: Diagnosis not present

## 2021-09-21 DIAGNOSIS — N186 End stage renal disease: Secondary | ICD-10-CM | POA: Diagnosis not present

## 2021-09-21 DIAGNOSIS — N2581 Secondary hyperparathyroidism of renal origin: Secondary | ICD-10-CM | POA: Diagnosis not present

## 2021-09-21 NOTE — Progress Notes (Signed)
Subjective:    Patient ID: Leah Davies, female    DOB: 03-03-1941, 80 y.o.   MRN: [id number] Chief Complaint  Patient presents with   Follow-up    3 Mo U/S    The patient returns to the office for followup of their dialysis access. The function of the access has been stable. The patient denies increased bleeding time or increased recirculation. Patient denies difficulty with cannulation. The patient denies hand pain or other symptoms consistent with steal phenomena.  No significant arm swelling.  The patient denies redness or swelling at the access site. The patient denies fever or chills at home or while on dialysis.  The patient denies amaurosis fugax or recent TIA symptoms. There are no recent neurological changes noted. The patient denies claudication symptoms or rest pain symptoms. The patient denies history of DVT, PE or superficial thrombophlebitis. The patient denies recent episodes of angina or shortness of breath.     Today the patient has a flow volume of 297 with no noticed significant stenosis.   Review of Systems  Hematological:  Does not bruise/bleed easily.  All other systems reviewed and are negative.     Objective:   Physical Exam Vitals reviewed.  HENT:     Head: Normocephalic.  Cardiovascular:     Rate and Rhythm: Normal rate.     Pulses: Normal pulses.     Arteriovenous access: Left arteriovenous access is present.    Comments: Good thrill and bruit Pulmonary:     Effort: Pulmonary effort is normal.  Skin:    General: Skin is warm and dry.  Neurological:     Mental Status: She is alert and oriented to person, place, and time.  Psychiatric:        Mood and Affect: Mood normal.        Behavior: Behavior normal.        Thought Content: Thought content normal.        Judgment: Judgment normal.    BP 122/67    Pulse 69    Ht 4\' 11"  (1.499 m)    Wt 145 lb 8.1 oz (66 kg)    BMI 29.39 kg/m   Past Medical History:  Diagnosis Date   Chronic kidney  disease    renal insufficiency   Complication of anesthesia    Vital signs low during colonoscopy had trouble to wake up    Elevated lipids    GERD (gastroesophageal reflux disease)    Gout    Hypertension     Social History   Socioeconomic History   Marital status: Single    Spouse name: Not on file   Number of children: 6   Years of education: Not on file   Highest education level: Not on file  Occupational History   Not on file  Tobacco Use   Smoking status: Never   Smokeless tobacco: Never  Vaping Use   Vaping Use: Never used  Substance and Sexual Activity   Alcohol use: Not Currently    Comment: rare   Drug use: No   Sexual activity: Not on file  Other Topics Concern   Not on file  Social History Narrative   Lives with daughter Mable Fill.    Social Determinants of Health   Financial Resource Strain: Not on file  Food Insecurity: Not on file  Transportation Needs: Not on file  Physical Activity: Not on file  Stress: Not on file  Social Connections: Not on file  Intimate Partner  Violence: Not on file    Past Surgical History:  Procedure Laterality Date   A/V FISTULAGRAM Left 08/22/2020   Procedure: A/V FISTULAGRAM;  Surgeon: Katha Cabal, MD;  Location: [place] [facility];  Service: Cardiovascular;  Laterality: Left;   A/V FISTULAGRAM Left 01/02/2021   Procedure: A/V FISTULAGRAM;  Surgeon: Katha Cabal, MD;  Location: [place] [facility];  Service: Cardiovascular;  Laterality: Left;   A/V FISTULAGRAM Left 04/11/2021   Procedure: A/V Fistulagram;  Surgeon: Algernon Huxley, MD;  Location: [place] [facility];  Service: Cardiovascular;  Laterality: Left;   AV FISTULA PLACEMENT Left 07/28/2020   Procedure: ARTERIOVENOUS (AV) FISTULA CREATION (BRACHIAL CEPHALIC );  Surgeon: Katha Cabal, MD;  Location: [facility];  Service: Vascular;  Laterality: Left;   BREAST EXCISIONAL BIOPSY Left 2010   benign   BREAST SURGERY     Lt breast biopsy    DIALYSIS/PERMA CATHETER INSERTION N/A 04/10/2020   Procedure: DIALYSIS/PERMA CATHETER INSERTION;  Surgeon: Algernon Huxley, MD;  Location: [place] [facility];  Service: Cardiovascular;  Laterality: N/A;   DIALYSIS/PERMA CATHETER REMOVAL N/A 01/30/2021   Procedure: DIALYSIS/PERMA CATHETER REMOVAL;  Surgeon: Katha Cabal, MD;  Location: [place] [facility];  Service: Cardiovascular;  Laterality: N/A;   KNEE ARTHROSCOPY Left 10/05/2015   Procedure: ARTHROSCOPY KNEE partial medial and lateral meniscectomy, lateral release for sublux patella;  Surgeon: Hessie Knows, MD;  Location: [facility];  Service: Orthopedics;  Laterality: Left;    Family History  Problem Relation Age of Onset   Breast cancer Sister 38    Allergies  Allergen Reactions   Amlodipine Hypertension    Caused increased blood pressure   Azithromycin Other (See Comments)    Unknown   Prednisone Other (See Comments)    Pt states she feels like her BP dropped, got dizzy, and very pale.   Tizanidine Other (See Comments)    Dizziness, pt states BP dropped, and very pale.     CBC Latest Ref Rng & Units 04/10/2021 07/28/2020 07/25/2020  WBC 4.0 - 10.5 K/uL 6.6 - 7.5  Hemoglobin 12.0 - 15.0 g/dL 10.2(L) 11.9(L) 12.0  Hematocrit 36.0 - 46.0 % 30.5(L) 35.0(L) 35.7(L)  Platelets 150 - 400 K/uL 187 - 162      CMP     Component Value Date/Time   NA 138 04/10/2021 0934   K 5.3 (H) 04/10/2021 0934   CL 106 04/10/2021 0934   CO2 21 (L) 04/10/2021 0934   GLUCOSE 108 (H) 04/10/2021 0934   BUN 80 (H) 04/10/2021 0934   CREATININE 7.13 (H) 04/10/2021 0934   CALCIUM 9.2 04/10/2021 0934   PROT 6.9 04/10/2021 0934   ALBUMIN 3.8 04/10/2021 0934   AST 30 04/10/2021 0934   ALT 24 04/10/2021 0934   ALKPHOS 66 04/10/2021 0934   BILITOT 1.0 04/10/2021 0934   GFRNONAA 5 (L) 04/10/2021 0934   GFRAA 8 (L) 06/26/2020 1925     No results found.     Assessment & Plan:   1. ESRD (end stage renal disease) ([place]) Recommend:  The  patient is doing well and currently has adequate dialysis access. The patient's dialysis center is not reporting any access issues. Flow pattern is stable when compared to the prior ultrasound.  The patient should have a duplex ultrasound of the dialysis access in 6 months. The patient will follow-up with me in the office after each ultrasound     2. Essential hypertension Continue antihypertensive medications as already ordered,  these medications have been reviewed and there are no changes at this time.   3. Mixed hyperlipidemia Continue statin as ordered and reviewed, no changes at this time    Current Outpatient Medications on File Prior to Visit  Medication Sig Dispense Refill   aspirin 81 MG EC tablet Take 1 tablet by mouth daily.     aspirin EC 81 MG EC tablet Take 1 tablet (81 mg total) by mouth daily. Swallow whole. 30 tablet 0   atorvastatin (LIPITOR) 40 MG tablet Take 1 tablet (40 mg total) by mouth daily. 30 tablet 0   atorvastatin (LIPITOR) 40 MG tablet Take 1 tablet by mouth daily.     hydrALAZINE (APRESOLINE) 25 MG tablet Take 25 mg by mouth in the morning and at bedtime. 1100 and 1900     isosorbide mononitrate (IMDUR) 30 MG 24 hr tablet Take 1 tablet (30 mg total) by mouth daily. 30 tablet 1   losartan (COZAAR) 50 MG tablet Take 50 mg by mouth daily.     metoprolol succinate (TOPROL-XL) 25 MG 24 hr tablet Take 25 mg by mouth daily.     omeprazole (PRILOSEC) 20 MG capsule Take 20 mg by mouth daily as needed (reflux).      No current facility-administered medications on file prior to visit.    There are no Patient Instructions on file for this visit. No follow-ups on file.   Kris Hartmann, NP

## 2021-09-22 ENCOUNTER — Encounter (INDEPENDENT_AMBULATORY_CARE_PROVIDER_SITE_OTHER): Payer: Self-pay | Admitting: Nurse Practitioner

## 2021-09-24 DIAGNOSIS — N186 End stage renal disease: Secondary | ICD-10-CM | POA: Diagnosis not present

## 2021-09-24 DIAGNOSIS — Z992 Dependence on renal dialysis: Secondary | ICD-10-CM | POA: Diagnosis not present

## 2021-09-24 DIAGNOSIS — N2581 Secondary hyperparathyroidism of renal origin: Secondary | ICD-10-CM | POA: Diagnosis not present

## 2021-09-26 DIAGNOSIS — N2581 Secondary hyperparathyroidism of renal origin: Secondary | ICD-10-CM | POA: Diagnosis not present

## 2021-09-26 DIAGNOSIS — Z992 Dependence on renal dialysis: Secondary | ICD-10-CM | POA: Diagnosis not present

## 2021-09-26 DIAGNOSIS — N186 End stage renal disease: Secondary | ICD-10-CM | POA: Diagnosis not present

## 2021-09-28 DIAGNOSIS — Z992 Dependence on renal dialysis: Secondary | ICD-10-CM | POA: Diagnosis not present

## 2021-09-28 DIAGNOSIS — N2581 Secondary hyperparathyroidism of renal origin: Secondary | ICD-10-CM | POA: Diagnosis not present

## 2021-09-28 DIAGNOSIS — N186 End stage renal disease: Secondary | ICD-10-CM | POA: Diagnosis not present

## 2021-09-29 DIAGNOSIS — Z992 Dependence on renal dialysis: Secondary | ICD-10-CM | POA: Diagnosis not present

## 2021-09-29 DIAGNOSIS — I120 Hypertensive chronic kidney disease with stage 5 chronic kidney disease or end stage renal disease: Secondary | ICD-10-CM | POA: Diagnosis not present

## 2021-09-29 DIAGNOSIS — N186 End stage renal disease: Secondary | ICD-10-CM | POA: Diagnosis not present

## 2021-10-03 DIAGNOSIS — N2581 Secondary hyperparathyroidism of renal origin: Secondary | ICD-10-CM | POA: Diagnosis not present

## 2021-10-03 DIAGNOSIS — Z992 Dependence on renal dialysis: Secondary | ICD-10-CM | POA: Diagnosis not present

## 2021-10-03 DIAGNOSIS — N186 End stage renal disease: Secondary | ICD-10-CM | POA: Diagnosis not present

## 2021-10-05 DIAGNOSIS — N186 End stage renal disease: Secondary | ICD-10-CM | POA: Diagnosis not present

## 2021-10-05 DIAGNOSIS — Z992 Dependence on renal dialysis: Secondary | ICD-10-CM | POA: Diagnosis not present

## 2021-10-05 DIAGNOSIS — N2581 Secondary hyperparathyroidism of renal origin: Secondary | ICD-10-CM | POA: Diagnosis not present

## 2021-10-08 DIAGNOSIS — N2581 Secondary hyperparathyroidism of renal origin: Secondary | ICD-10-CM | POA: Diagnosis not present

## 2021-10-08 DIAGNOSIS — Z992 Dependence on renal dialysis: Secondary | ICD-10-CM | POA: Diagnosis not present

## 2021-10-08 DIAGNOSIS — N186 End stage renal disease: Secondary | ICD-10-CM | POA: Diagnosis not present

## 2021-10-10 DIAGNOSIS — Z992 Dependence on renal dialysis: Secondary | ICD-10-CM | POA: Diagnosis not present

## 2021-10-10 DIAGNOSIS — N186 End stage renal disease: Secondary | ICD-10-CM | POA: Diagnosis not present

## 2021-10-10 DIAGNOSIS — N2581 Secondary hyperparathyroidism of renal origin: Secondary | ICD-10-CM | POA: Diagnosis not present

## 2021-10-12 DIAGNOSIS — N2581 Secondary hyperparathyroidism of renal origin: Secondary | ICD-10-CM | POA: Diagnosis not present

## 2021-10-12 DIAGNOSIS — N186 End stage renal disease: Secondary | ICD-10-CM | POA: Diagnosis not present

## 2021-10-12 DIAGNOSIS — Z992 Dependence on renal dialysis: Secondary | ICD-10-CM | POA: Diagnosis not present

## 2021-10-15 DIAGNOSIS — N2581 Secondary hyperparathyroidism of renal origin: Secondary | ICD-10-CM | POA: Diagnosis not present

## 2021-10-15 DIAGNOSIS — Z992 Dependence on renal dialysis: Secondary | ICD-10-CM | POA: Diagnosis not present

## 2021-10-15 DIAGNOSIS — N186 End stage renal disease: Secondary | ICD-10-CM | POA: Diagnosis not present

## 2021-10-17 DIAGNOSIS — N2581 Secondary hyperparathyroidism of renal origin: Secondary | ICD-10-CM | POA: Diagnosis not present

## 2021-10-17 DIAGNOSIS — Z992 Dependence on renal dialysis: Secondary | ICD-10-CM | POA: Diagnosis not present

## 2021-10-17 DIAGNOSIS — N186 End stage renal disease: Secondary | ICD-10-CM | POA: Diagnosis not present

## 2021-10-19 DIAGNOSIS — N186 End stage renal disease: Secondary | ICD-10-CM | POA: Diagnosis not present

## 2021-10-19 DIAGNOSIS — Z992 Dependence on renal dialysis: Secondary | ICD-10-CM | POA: Diagnosis not present

## 2021-10-19 DIAGNOSIS — N2581 Secondary hyperparathyroidism of renal origin: Secondary | ICD-10-CM | POA: Diagnosis not present

## 2021-10-22 DIAGNOSIS — N2581 Secondary hyperparathyroidism of renal origin: Secondary | ICD-10-CM | POA: Diagnosis not present

## 2021-10-22 DIAGNOSIS — N186 End stage renal disease: Secondary | ICD-10-CM | POA: Diagnosis not present

## 2021-10-22 DIAGNOSIS — Z992 Dependence on renal dialysis: Secondary | ICD-10-CM | POA: Diagnosis not present

## 2021-10-24 DIAGNOSIS — N186 End stage renal disease: Secondary | ICD-10-CM | POA: Diagnosis not present

## 2021-10-24 DIAGNOSIS — Z992 Dependence on renal dialysis: Secondary | ICD-10-CM | POA: Diagnosis not present

## 2021-10-24 DIAGNOSIS — N2581 Secondary hyperparathyroidism of renal origin: Secondary | ICD-10-CM | POA: Diagnosis not present

## 2021-10-26 DIAGNOSIS — N2581 Secondary hyperparathyroidism of renal origin: Secondary | ICD-10-CM | POA: Diagnosis not present

## 2021-10-26 DIAGNOSIS — Z992 Dependence on renal dialysis: Secondary | ICD-10-CM | POA: Diagnosis not present

## 2021-10-26 DIAGNOSIS — N186 End stage renal disease: Secondary | ICD-10-CM | POA: Diagnosis not present

## 2021-10-29 DIAGNOSIS — N186 End stage renal disease: Secondary | ICD-10-CM | POA: Diagnosis not present

## 2021-10-29 DIAGNOSIS — N2581 Secondary hyperparathyroidism of renal origin: Secondary | ICD-10-CM | POA: Diagnosis not present

## 2021-10-29 DIAGNOSIS — Z992 Dependence on renal dialysis: Secondary | ICD-10-CM | POA: Diagnosis not present

## 2021-10-30 DIAGNOSIS — I120 Hypertensive chronic kidney disease with stage 5 chronic kidney disease or end stage renal disease: Secondary | ICD-10-CM | POA: Diagnosis not present

## 2021-10-30 DIAGNOSIS — Z992 Dependence on renal dialysis: Secondary | ICD-10-CM | POA: Diagnosis not present

## 2021-10-30 DIAGNOSIS — N186 End stage renal disease: Secondary | ICD-10-CM | POA: Diagnosis not present

## 2021-11-02 DIAGNOSIS — N2581 Secondary hyperparathyroidism of renal origin: Secondary | ICD-10-CM | POA: Diagnosis not present

## 2021-11-02 DIAGNOSIS — N186 End stage renal disease: Secondary | ICD-10-CM | POA: Diagnosis not present

## 2021-11-02 DIAGNOSIS — Z992 Dependence on renal dialysis: Secondary | ICD-10-CM | POA: Diagnosis not present

## 2021-11-05 DIAGNOSIS — N2581 Secondary hyperparathyroidism of renal origin: Secondary | ICD-10-CM | POA: Diagnosis not present

## 2021-11-05 DIAGNOSIS — Z992 Dependence on renal dialysis: Secondary | ICD-10-CM | POA: Diagnosis not present

## 2021-11-05 DIAGNOSIS — N186 End stage renal disease: Secondary | ICD-10-CM | POA: Diagnosis not present

## 2021-11-09 DIAGNOSIS — N186 End stage renal disease: Secondary | ICD-10-CM | POA: Diagnosis not present

## 2021-11-09 DIAGNOSIS — Z992 Dependence on renal dialysis: Secondary | ICD-10-CM | POA: Diagnosis not present

## 2021-11-09 DIAGNOSIS — N2581 Secondary hyperparathyroidism of renal origin: Secondary | ICD-10-CM | POA: Diagnosis not present

## 2021-11-12 DIAGNOSIS — Z992 Dependence on renal dialysis: Secondary | ICD-10-CM | POA: Diagnosis not present

## 2021-11-12 DIAGNOSIS — N186 End stage renal disease: Secondary | ICD-10-CM | POA: Diagnosis not present

## 2021-11-12 DIAGNOSIS — N2581 Secondary hyperparathyroidism of renal origin: Secondary | ICD-10-CM | POA: Diagnosis not present

## 2021-11-16 DIAGNOSIS — N186 End stage renal disease: Secondary | ICD-10-CM | POA: Diagnosis not present

## 2021-11-16 DIAGNOSIS — N2581 Secondary hyperparathyroidism of renal origin: Secondary | ICD-10-CM | POA: Diagnosis not present

## 2021-11-16 DIAGNOSIS — Z992 Dependence on renal dialysis: Secondary | ICD-10-CM | POA: Diagnosis not present

## 2021-11-19 DIAGNOSIS — N186 End stage renal disease: Secondary | ICD-10-CM | POA: Diagnosis not present

## 2021-11-19 DIAGNOSIS — Z992 Dependence on renal dialysis: Secondary | ICD-10-CM | POA: Diagnosis not present

## 2021-11-19 DIAGNOSIS — N2581 Secondary hyperparathyroidism of renal origin: Secondary | ICD-10-CM | POA: Diagnosis not present

## 2021-11-21 DIAGNOSIS — N186 End stage renal disease: Secondary | ICD-10-CM | POA: Diagnosis not present

## 2021-11-21 DIAGNOSIS — Z992 Dependence on renal dialysis: Secondary | ICD-10-CM | POA: Diagnosis not present

## 2021-11-21 DIAGNOSIS — N2581 Secondary hyperparathyroidism of renal origin: Secondary | ICD-10-CM | POA: Diagnosis not present

## 2021-11-23 DIAGNOSIS — N186 End stage renal disease: Secondary | ICD-10-CM | POA: Diagnosis not present

## 2021-11-23 DIAGNOSIS — Z992 Dependence on renal dialysis: Secondary | ICD-10-CM | POA: Diagnosis not present

## 2021-11-23 DIAGNOSIS — N2581 Secondary hyperparathyroidism of renal origin: Secondary | ICD-10-CM | POA: Diagnosis not present

## 2021-11-26 DIAGNOSIS — N186 End stage renal disease: Secondary | ICD-10-CM | POA: Diagnosis not present

## 2021-11-26 DIAGNOSIS — Z992 Dependence on renal dialysis: Secondary | ICD-10-CM | POA: Diagnosis not present

## 2021-11-26 DIAGNOSIS — N2581 Secondary hyperparathyroidism of renal origin: Secondary | ICD-10-CM | POA: Diagnosis not present

## 2021-11-27 DIAGNOSIS — R195 Other fecal abnormalities: Secondary | ICD-10-CM | POA: Diagnosis not present

## 2021-11-27 DIAGNOSIS — R079 Chest pain, unspecified: Secondary | ICD-10-CM | POA: Diagnosis not present

## 2021-11-27 DIAGNOSIS — Z992 Dependence on renal dialysis: Secondary | ICD-10-CM | POA: Diagnosis not present

## 2021-11-27 DIAGNOSIS — N186 End stage renal disease: Secondary | ICD-10-CM | POA: Diagnosis not present

## 2021-11-27 DIAGNOSIS — K219 Gastro-esophageal reflux disease without esophagitis: Secondary | ICD-10-CM | POA: Diagnosis not present

## 2021-11-27 DIAGNOSIS — Z800 Family history of malignant neoplasm of digestive organs: Secondary | ICD-10-CM | POA: Diagnosis not present

## 2021-11-27 DIAGNOSIS — I120 Hypertensive chronic kidney disease with stage 5 chronic kidney disease or end stage renal disease: Secondary | ICD-10-CM | POA: Diagnosis not present

## 2021-11-28 DIAGNOSIS — Z992 Dependence on renal dialysis: Secondary | ICD-10-CM | POA: Diagnosis not present

## 2021-11-28 DIAGNOSIS — N186 End stage renal disease: Secondary | ICD-10-CM | POA: Diagnosis not present

## 2021-11-28 DIAGNOSIS — N2581 Secondary hyperparathyroidism of renal origin: Secondary | ICD-10-CM | POA: Diagnosis not present

## 2021-11-30 DIAGNOSIS — N186 End stage renal disease: Secondary | ICD-10-CM | POA: Diagnosis not present

## 2021-11-30 DIAGNOSIS — N2581 Secondary hyperparathyroidism of renal origin: Secondary | ICD-10-CM | POA: Diagnosis not present

## 2021-11-30 DIAGNOSIS — Z992 Dependence on renal dialysis: Secondary | ICD-10-CM | POA: Diagnosis not present

## 2021-12-05 DIAGNOSIS — Z992 Dependence on renal dialysis: Secondary | ICD-10-CM | POA: Diagnosis not present

## 2021-12-05 DIAGNOSIS — N2581 Secondary hyperparathyroidism of renal origin: Secondary | ICD-10-CM | POA: Diagnosis not present

## 2021-12-05 DIAGNOSIS — N186 End stage renal disease: Secondary | ICD-10-CM | POA: Diagnosis not present

## 2021-12-07 DIAGNOSIS — Z992 Dependence on renal dialysis: Secondary | ICD-10-CM | POA: Diagnosis not present

## 2021-12-07 DIAGNOSIS — N186 End stage renal disease: Secondary | ICD-10-CM | POA: Diagnosis not present

## 2021-12-07 DIAGNOSIS — N2581 Secondary hyperparathyroidism of renal origin: Secondary | ICD-10-CM | POA: Diagnosis not present

## 2021-12-10 DIAGNOSIS — Z992 Dependence on renal dialysis: Secondary | ICD-10-CM | POA: Diagnosis not present

## 2021-12-10 DIAGNOSIS — N2581 Secondary hyperparathyroidism of renal origin: Secondary | ICD-10-CM | POA: Diagnosis not present

## 2021-12-10 DIAGNOSIS — N186 End stage renal disease: Secondary | ICD-10-CM | POA: Diagnosis not present

## 2021-12-12 DIAGNOSIS — D631 Anemia in chronic kidney disease: Secondary | ICD-10-CM | POA: Diagnosis not present

## 2021-12-12 DIAGNOSIS — N2581 Secondary hyperparathyroidism of renal origin: Secondary | ICD-10-CM | POA: Diagnosis not present

## 2021-12-12 DIAGNOSIS — I120 Hypertensive chronic kidney disease with stage 5 chronic kidney disease or end stage renal disease: Secondary | ICD-10-CM | POA: Diagnosis not present

## 2021-12-12 DIAGNOSIS — N186 End stage renal disease: Secondary | ICD-10-CM | POA: Diagnosis not present

## 2021-12-12 DIAGNOSIS — Z992 Dependence on renal dialysis: Secondary | ICD-10-CM | POA: Diagnosis not present

## 2021-12-13 DIAGNOSIS — N186 End stage renal disease: Secondary | ICD-10-CM | POA: Diagnosis not present

## 2021-12-13 DIAGNOSIS — N2581 Secondary hyperparathyroidism of renal origin: Secondary | ICD-10-CM | POA: Diagnosis not present

## 2021-12-13 DIAGNOSIS — Z992 Dependence on renal dialysis: Secondary | ICD-10-CM | POA: Diagnosis not present

## 2021-12-13 DIAGNOSIS — I120 Hypertensive chronic kidney disease with stage 5 chronic kidney disease or end stage renal disease: Secondary | ICD-10-CM | POA: Diagnosis not present

## 2021-12-14 DIAGNOSIS — Z992 Dependence on renal dialysis: Secondary | ICD-10-CM | POA: Diagnosis not present

## 2021-12-14 DIAGNOSIS — N186 End stage renal disease: Secondary | ICD-10-CM | POA: Diagnosis not present

## 2021-12-14 DIAGNOSIS — N2581 Secondary hyperparathyroidism of renal origin: Secondary | ICD-10-CM | POA: Diagnosis not present

## 2021-12-17 DIAGNOSIS — N2581 Secondary hyperparathyroidism of renal origin: Secondary | ICD-10-CM | POA: Diagnosis not present

## 2021-12-17 DIAGNOSIS — N186 End stage renal disease: Secondary | ICD-10-CM | POA: Diagnosis not present

## 2021-12-17 DIAGNOSIS — Z992 Dependence on renal dialysis: Secondary | ICD-10-CM | POA: Diagnosis not present

## 2021-12-19 DIAGNOSIS — N2581 Secondary hyperparathyroidism of renal origin: Secondary | ICD-10-CM | POA: Diagnosis not present

## 2021-12-19 DIAGNOSIS — Z992 Dependence on renal dialysis: Secondary | ICD-10-CM | POA: Diagnosis not present

## 2021-12-19 DIAGNOSIS — N186 End stage renal disease: Secondary | ICD-10-CM | POA: Diagnosis not present

## 2021-12-21 DIAGNOSIS — N186 End stage renal disease: Secondary | ICD-10-CM | POA: Diagnosis not present

## 2021-12-21 DIAGNOSIS — Z992 Dependence on renal dialysis: Secondary | ICD-10-CM | POA: Diagnosis not present

## 2021-12-21 DIAGNOSIS — N2581 Secondary hyperparathyroidism of renal origin: Secondary | ICD-10-CM | POA: Diagnosis not present

## 2021-12-24 DIAGNOSIS — N186 End stage renal disease: Secondary | ICD-10-CM | POA: Diagnosis not present

## 2021-12-24 DIAGNOSIS — N2581 Secondary hyperparathyroidism of renal origin: Secondary | ICD-10-CM | POA: Diagnosis not present

## 2021-12-24 DIAGNOSIS — Z992 Dependence on renal dialysis: Secondary | ICD-10-CM | POA: Diagnosis not present

## 2021-12-26 DIAGNOSIS — N2581 Secondary hyperparathyroidism of renal origin: Secondary | ICD-10-CM | POA: Diagnosis not present

## 2021-12-26 DIAGNOSIS — Z992 Dependence on renal dialysis: Secondary | ICD-10-CM | POA: Diagnosis not present

## 2021-12-26 DIAGNOSIS — N186 End stage renal disease: Secondary | ICD-10-CM | POA: Diagnosis not present

## 2021-12-28 DIAGNOSIS — N2581 Secondary hyperparathyroidism of renal origin: Secondary | ICD-10-CM | POA: Diagnosis not present

## 2021-12-28 DIAGNOSIS — I120 Hypertensive chronic kidney disease with stage 5 chronic kidney disease or end stage renal disease: Secondary | ICD-10-CM | POA: Diagnosis not present

## 2021-12-28 DIAGNOSIS — Z992 Dependence on renal dialysis: Secondary | ICD-10-CM | POA: Diagnosis not present

## 2021-12-28 DIAGNOSIS — N186 End stage renal disease: Secondary | ICD-10-CM | POA: Diagnosis not present

## 2021-12-31 DIAGNOSIS — N2581 Secondary hyperparathyroidism of renal origin: Secondary | ICD-10-CM | POA: Diagnosis not present

## 2021-12-31 DIAGNOSIS — Z992 Dependence on renal dialysis: Secondary | ICD-10-CM | POA: Diagnosis not present

## 2021-12-31 DIAGNOSIS — N186 End stage renal disease: Secondary | ICD-10-CM | POA: Diagnosis not present

## 2022-01-02 DIAGNOSIS — N2581 Secondary hyperparathyroidism of renal origin: Secondary | ICD-10-CM | POA: Diagnosis not present

## 2022-01-02 DIAGNOSIS — Z992 Dependence on renal dialysis: Secondary | ICD-10-CM | POA: Diagnosis not present

## 2022-01-02 DIAGNOSIS — N186 End stage renal disease: Secondary | ICD-10-CM | POA: Diagnosis not present

## 2022-01-04 DIAGNOSIS — Z992 Dependence on renal dialysis: Secondary | ICD-10-CM | POA: Diagnosis not present

## 2022-01-04 DIAGNOSIS — N186 End stage renal disease: Secondary | ICD-10-CM | POA: Diagnosis not present

## 2022-01-04 DIAGNOSIS — N2581 Secondary hyperparathyroidism of renal origin: Secondary | ICD-10-CM | POA: Diagnosis not present

## 2022-01-14 DIAGNOSIS — N2581 Secondary hyperparathyroidism of renal origin: Secondary | ICD-10-CM | POA: Diagnosis not present

## 2022-01-14 DIAGNOSIS — N186 End stage renal disease: Secondary | ICD-10-CM | POA: Diagnosis not present

## 2022-01-14 DIAGNOSIS — Z992 Dependence on renal dialysis: Secondary | ICD-10-CM | POA: Diagnosis not present

## 2022-01-15 DIAGNOSIS — E782 Mixed hyperlipidemia: Secondary | ICD-10-CM | POA: Diagnosis not present

## 2022-01-15 DIAGNOSIS — I6523 Occlusion and stenosis of bilateral carotid arteries: Secondary | ICD-10-CM | POA: Diagnosis not present

## 2022-01-15 DIAGNOSIS — R0602 Shortness of breath: Secondary | ICD-10-CM | POA: Diagnosis not present

## 2022-01-15 DIAGNOSIS — R42 Dizziness and giddiness: Secondary | ICD-10-CM | POA: Diagnosis not present

## 2022-01-15 DIAGNOSIS — I208 Other forms of angina pectoris: Secondary | ICD-10-CM | POA: Diagnosis not present

## 2022-01-15 DIAGNOSIS — I10 Essential (primary) hypertension: Secondary | ICD-10-CM | POA: Diagnosis not present

## 2022-01-16 DIAGNOSIS — N186 End stage renal disease: Secondary | ICD-10-CM | POA: Diagnosis not present

## 2022-01-16 DIAGNOSIS — N2581 Secondary hyperparathyroidism of renal origin: Secondary | ICD-10-CM | POA: Diagnosis not present

## 2022-01-16 DIAGNOSIS — Z992 Dependence on renal dialysis: Secondary | ICD-10-CM | POA: Diagnosis not present

## 2022-01-18 DIAGNOSIS — Z992 Dependence on renal dialysis: Secondary | ICD-10-CM | POA: Diagnosis not present

## 2022-01-18 DIAGNOSIS — N186 End stage renal disease: Secondary | ICD-10-CM | POA: Diagnosis not present

## 2022-01-18 DIAGNOSIS — N2581 Secondary hyperparathyroidism of renal origin: Secondary | ICD-10-CM | POA: Diagnosis not present

## 2022-01-21 DIAGNOSIS — N186 End stage renal disease: Secondary | ICD-10-CM | POA: Diagnosis not present

## 2022-01-21 DIAGNOSIS — N2581 Secondary hyperparathyroidism of renal origin: Secondary | ICD-10-CM | POA: Diagnosis not present

## 2022-01-21 DIAGNOSIS — Z992 Dependence on renal dialysis: Secondary | ICD-10-CM | POA: Diagnosis not present

## 2022-01-23 DIAGNOSIS — Z992 Dependence on renal dialysis: Secondary | ICD-10-CM | POA: Diagnosis not present

## 2022-01-23 DIAGNOSIS — N2581 Secondary hyperparathyroidism of renal origin: Secondary | ICD-10-CM | POA: Diagnosis not present

## 2022-01-23 DIAGNOSIS — N186 End stage renal disease: Secondary | ICD-10-CM | POA: Diagnosis not present

## 2022-01-25 DIAGNOSIS — Z992 Dependence on renal dialysis: Secondary | ICD-10-CM | POA: Diagnosis not present

## 2022-01-25 DIAGNOSIS — N186 End stage renal disease: Secondary | ICD-10-CM | POA: Diagnosis not present

## 2022-01-25 DIAGNOSIS — N2581 Secondary hyperparathyroidism of renal origin: Secondary | ICD-10-CM | POA: Diagnosis not present

## 2022-01-27 DIAGNOSIS — I120 Hypertensive chronic kidney disease with stage 5 chronic kidney disease or end stage renal disease: Secondary | ICD-10-CM | POA: Diagnosis not present

## 2022-01-27 DIAGNOSIS — Z992 Dependence on renal dialysis: Secondary | ICD-10-CM | POA: Diagnosis not present

## 2022-01-27 DIAGNOSIS — N186 End stage renal disease: Secondary | ICD-10-CM | POA: Diagnosis not present

## 2022-01-28 DIAGNOSIS — N186 End stage renal disease: Secondary | ICD-10-CM | POA: Diagnosis not present

## 2022-01-28 DIAGNOSIS — Z992 Dependence on renal dialysis: Secondary | ICD-10-CM | POA: Diagnosis not present

## 2022-01-28 DIAGNOSIS — N2581 Secondary hyperparathyroidism of renal origin: Secondary | ICD-10-CM | POA: Diagnosis not present

## 2022-01-30 DIAGNOSIS — Z992 Dependence on renal dialysis: Secondary | ICD-10-CM | POA: Diagnosis not present

## 2022-01-30 DIAGNOSIS — N186 End stage renal disease: Secondary | ICD-10-CM | POA: Diagnosis not present

## 2022-01-30 DIAGNOSIS — N2581 Secondary hyperparathyroidism of renal origin: Secondary | ICD-10-CM | POA: Diagnosis not present

## 2022-02-01 DIAGNOSIS — N186 End stage renal disease: Secondary | ICD-10-CM | POA: Diagnosis not present

## 2022-02-01 DIAGNOSIS — Z992 Dependence on renal dialysis: Secondary | ICD-10-CM | POA: Diagnosis not present

## 2022-02-01 DIAGNOSIS — N2581 Secondary hyperparathyroidism of renal origin: Secondary | ICD-10-CM | POA: Diagnosis not present

## 2022-02-04 DIAGNOSIS — N2581 Secondary hyperparathyroidism of renal origin: Secondary | ICD-10-CM | POA: Diagnosis not present

## 2022-02-04 DIAGNOSIS — N186 End stage renal disease: Secondary | ICD-10-CM | POA: Diagnosis not present

## 2022-02-04 DIAGNOSIS — Z992 Dependence on renal dialysis: Secondary | ICD-10-CM | POA: Diagnosis not present

## 2022-02-08 DIAGNOSIS — N2581 Secondary hyperparathyroidism of renal origin: Secondary | ICD-10-CM | POA: Diagnosis not present

## 2022-02-08 DIAGNOSIS — N186 End stage renal disease: Secondary | ICD-10-CM | POA: Diagnosis not present

## 2022-02-08 DIAGNOSIS — Z992 Dependence on renal dialysis: Secondary | ICD-10-CM | POA: Diagnosis not present

## 2022-02-11 DIAGNOSIS — Z992 Dependence on renal dialysis: Secondary | ICD-10-CM | POA: Diagnosis not present

## 2022-02-11 DIAGNOSIS — N2581 Secondary hyperparathyroidism of renal origin: Secondary | ICD-10-CM | POA: Diagnosis not present

## 2022-02-11 DIAGNOSIS — N186 End stage renal disease: Secondary | ICD-10-CM | POA: Diagnosis not present

## 2022-02-14 DIAGNOSIS — R0602 Shortness of breath: Secondary | ICD-10-CM | POA: Diagnosis not present

## 2022-02-14 DIAGNOSIS — I6523 Occlusion and stenosis of bilateral carotid arteries: Secondary | ICD-10-CM | POA: Diagnosis not present

## 2022-02-14 DIAGNOSIS — I208 Other forms of angina pectoris: Secondary | ICD-10-CM | POA: Diagnosis not present

## 2022-02-14 DIAGNOSIS — R42 Dizziness and giddiness: Secondary | ICD-10-CM | POA: Diagnosis not present

## 2022-02-15 DIAGNOSIS — N2581 Secondary hyperparathyroidism of renal origin: Secondary | ICD-10-CM | POA: Diagnosis not present

## 2022-02-15 DIAGNOSIS — N186 End stage renal disease: Secondary | ICD-10-CM | POA: Diagnosis not present

## 2022-02-15 DIAGNOSIS — Z992 Dependence on renal dialysis: Secondary | ICD-10-CM | POA: Diagnosis not present

## 2022-02-18 DIAGNOSIS — N186 End stage renal disease: Secondary | ICD-10-CM | POA: Diagnosis not present

## 2022-02-18 DIAGNOSIS — Z992 Dependence on renal dialysis: Secondary | ICD-10-CM | POA: Diagnosis not present

## 2022-02-18 DIAGNOSIS — N2581 Secondary hyperparathyroidism of renal origin: Secondary | ICD-10-CM | POA: Diagnosis not present

## 2022-02-20 ENCOUNTER — Encounter: Payer: Self-pay | Admitting: Gastroenterology

## 2022-02-20 DIAGNOSIS — N186 End stage renal disease: Secondary | ICD-10-CM | POA: Diagnosis not present

## 2022-02-20 DIAGNOSIS — Z992 Dependence on renal dialysis: Secondary | ICD-10-CM | POA: Diagnosis not present

## 2022-02-20 DIAGNOSIS — N2581 Secondary hyperparathyroidism of renal origin: Secondary | ICD-10-CM | POA: Diagnosis not present

## 2022-02-21 ENCOUNTER — Ambulatory Visit: Admission: RE | Admit: 2022-02-21 | Payer: Medicare HMO | Source: Home / Self Care | Admitting: Gastroenterology

## 2022-02-21 ENCOUNTER — Encounter: Admission: RE | Payer: Self-pay | Source: Home / Self Care

## 2022-02-21 HISTORY — DX: Unspecified glaucoma: H40.9

## 2022-02-21 HISTORY — DX: Unspecified osteoarthritis, unspecified site: M19.90

## 2022-02-21 HISTORY — DX: Hyperlipidemia, unspecified: E78.5

## 2022-02-21 SURGERY — COLONOSCOPY WITH PROPOFOL
Anesthesia: General

## 2022-02-22 DIAGNOSIS — N186 End stage renal disease: Secondary | ICD-10-CM | POA: Diagnosis not present

## 2022-02-22 DIAGNOSIS — N2581 Secondary hyperparathyroidism of renal origin: Secondary | ICD-10-CM | POA: Diagnosis not present

## 2022-02-22 DIAGNOSIS — Z992 Dependence on renal dialysis: Secondary | ICD-10-CM | POA: Diagnosis not present

## 2022-02-26 ENCOUNTER — Other Ambulatory Visit: Payer: Self-pay | Admitting: Student

## 2022-02-26 ENCOUNTER — Ambulatory Visit
Admission: RE | Admit: 2022-02-26 | Discharge: 2022-02-26 | Disposition: A | Discharge status: Home / Self Care | Payer: Self-pay | Source: Ambulatory Visit | Attending: Student | Admitting: Student

## 2022-02-26 DIAGNOSIS — I6529 Occlusion and stenosis of unspecified carotid artery: Secondary | ICD-10-CM

## 2022-02-27 DIAGNOSIS — I120 Hypertensive chronic kidney disease with stage 5 chronic kidney disease or end stage renal disease: Secondary | ICD-10-CM | POA: Diagnosis not present

## 2022-02-27 DIAGNOSIS — Z992 Dependence on renal dialysis: Secondary | ICD-10-CM | POA: Diagnosis not present

## 2022-02-27 DIAGNOSIS — N186 End stage renal disease: Secondary | ICD-10-CM | POA: Diagnosis not present

## 2022-02-27 DIAGNOSIS — R42 Dizziness and giddiness: Secondary | ICD-10-CM | POA: Diagnosis not present

## 2022-02-27 DIAGNOSIS — I6522 Occlusion and stenosis of left carotid artery: Secondary | ICD-10-CM | POA: Diagnosis not present

## 2022-02-28 DIAGNOSIS — E782 Mixed hyperlipidemia: Secondary | ICD-10-CM | POA: Diagnosis not present

## 2022-02-28 DIAGNOSIS — I10 Essential (primary) hypertension: Secondary | ICD-10-CM | POA: Diagnosis not present

## 2022-03-01 DIAGNOSIS — N2581 Secondary hyperparathyroidism of renal origin: Secondary | ICD-10-CM | POA: Diagnosis not present

## 2022-03-01 DIAGNOSIS — Z992 Dependence on renal dialysis: Secondary | ICD-10-CM | POA: Diagnosis not present

## 2022-03-01 DIAGNOSIS — N186 End stage renal disease: Secondary | ICD-10-CM | POA: Diagnosis not present

## 2022-03-04 DIAGNOSIS — N186 End stage renal disease: Secondary | ICD-10-CM | POA: Diagnosis not present

## 2022-03-04 DIAGNOSIS — N2581 Secondary hyperparathyroidism of renal origin: Secondary | ICD-10-CM | POA: Diagnosis not present

## 2022-03-04 DIAGNOSIS — Z992 Dependence on renal dialysis: Secondary | ICD-10-CM | POA: Diagnosis not present

## 2022-03-06 DIAGNOSIS — N186 End stage renal disease: Secondary | ICD-10-CM | POA: Diagnosis not present

## 2022-03-06 DIAGNOSIS — Z992 Dependence on renal dialysis: Secondary | ICD-10-CM | POA: Diagnosis not present

## 2022-03-06 DIAGNOSIS — N2581 Secondary hyperparathyroidism of renal origin: Secondary | ICD-10-CM | POA: Diagnosis not present

## 2022-03-08 DIAGNOSIS — N2581 Secondary hyperparathyroidism of renal origin: Secondary | ICD-10-CM | POA: Diagnosis not present

## 2022-03-08 DIAGNOSIS — N186 End stage renal disease: Secondary | ICD-10-CM | POA: Diagnosis not present

## 2022-03-08 DIAGNOSIS — Z992 Dependence on renal dialysis: Secondary | ICD-10-CM | POA: Diagnosis not present

## 2022-03-11 DIAGNOSIS — N2581 Secondary hyperparathyroidism of renal origin: Secondary | ICD-10-CM | POA: Diagnosis not present

## 2022-03-11 DIAGNOSIS — N186 End stage renal disease: Secondary | ICD-10-CM | POA: Diagnosis not present

## 2022-03-11 DIAGNOSIS — Z992 Dependence on renal dialysis: Secondary | ICD-10-CM | POA: Diagnosis not present

## 2022-03-13 DIAGNOSIS — N2581 Secondary hyperparathyroidism of renal origin: Secondary | ICD-10-CM | POA: Diagnosis not present

## 2022-03-13 DIAGNOSIS — N186 End stage renal disease: Secondary | ICD-10-CM | POA: Diagnosis not present

## 2022-03-13 DIAGNOSIS — Z992 Dependence on renal dialysis: Secondary | ICD-10-CM | POA: Diagnosis not present

## 2022-03-18 DIAGNOSIS — N2581 Secondary hyperparathyroidism of renal origin: Secondary | ICD-10-CM | POA: Diagnosis not present

## 2022-03-18 DIAGNOSIS — Z992 Dependence on renal dialysis: Secondary | ICD-10-CM | POA: Diagnosis not present

## 2022-03-18 DIAGNOSIS — N186 End stage renal disease: Secondary | ICD-10-CM | POA: Diagnosis not present

## 2022-03-21 ENCOUNTER — Ambulatory Visit (INDEPENDENT_AMBULATORY_CARE_PROVIDER_SITE_OTHER): Payer: Medicare HMO | Admitting: Nurse Practitioner

## 2022-03-21 ENCOUNTER — Encounter (INDEPENDENT_AMBULATORY_CARE_PROVIDER_SITE_OTHER): Payer: Medicare HMO

## 2022-03-22 DIAGNOSIS — Z992 Dependence on renal dialysis: Secondary | ICD-10-CM | POA: Diagnosis not present

## 2022-03-22 DIAGNOSIS — N186 End stage renal disease: Secondary | ICD-10-CM | POA: Diagnosis not present

## 2022-03-22 DIAGNOSIS — N2581 Secondary hyperparathyroidism of renal origin: Secondary | ICD-10-CM | POA: Diagnosis not present

## 2022-03-25 DIAGNOSIS — Z992 Dependence on renal dialysis: Secondary | ICD-10-CM | POA: Diagnosis not present

## 2022-03-25 DIAGNOSIS — N2581 Secondary hyperparathyroidism of renal origin: Secondary | ICD-10-CM | POA: Diagnosis not present

## 2022-03-25 DIAGNOSIS — N186 End stage renal disease: Secondary | ICD-10-CM | POA: Diagnosis not present

## 2022-03-27 DIAGNOSIS — Z992 Dependence on renal dialysis: Secondary | ICD-10-CM | POA: Diagnosis not present

## 2022-03-27 DIAGNOSIS — N186 End stage renal disease: Secondary | ICD-10-CM | POA: Diagnosis not present

## 2022-03-27 DIAGNOSIS — N2581 Secondary hyperparathyroidism of renal origin: Secondary | ICD-10-CM | POA: Diagnosis not present

## 2022-03-28 IMAGING — CR DG CHEST 2V
2 series · 2 of 2 positions shown · non-contrast
Comparison: One-view chest x-ray 10/07/2016

CLINICAL DATA: End-stage renal disease.  New dialysis starch.

EXAM:
CHEST - 2 VIEW

[chest pa]
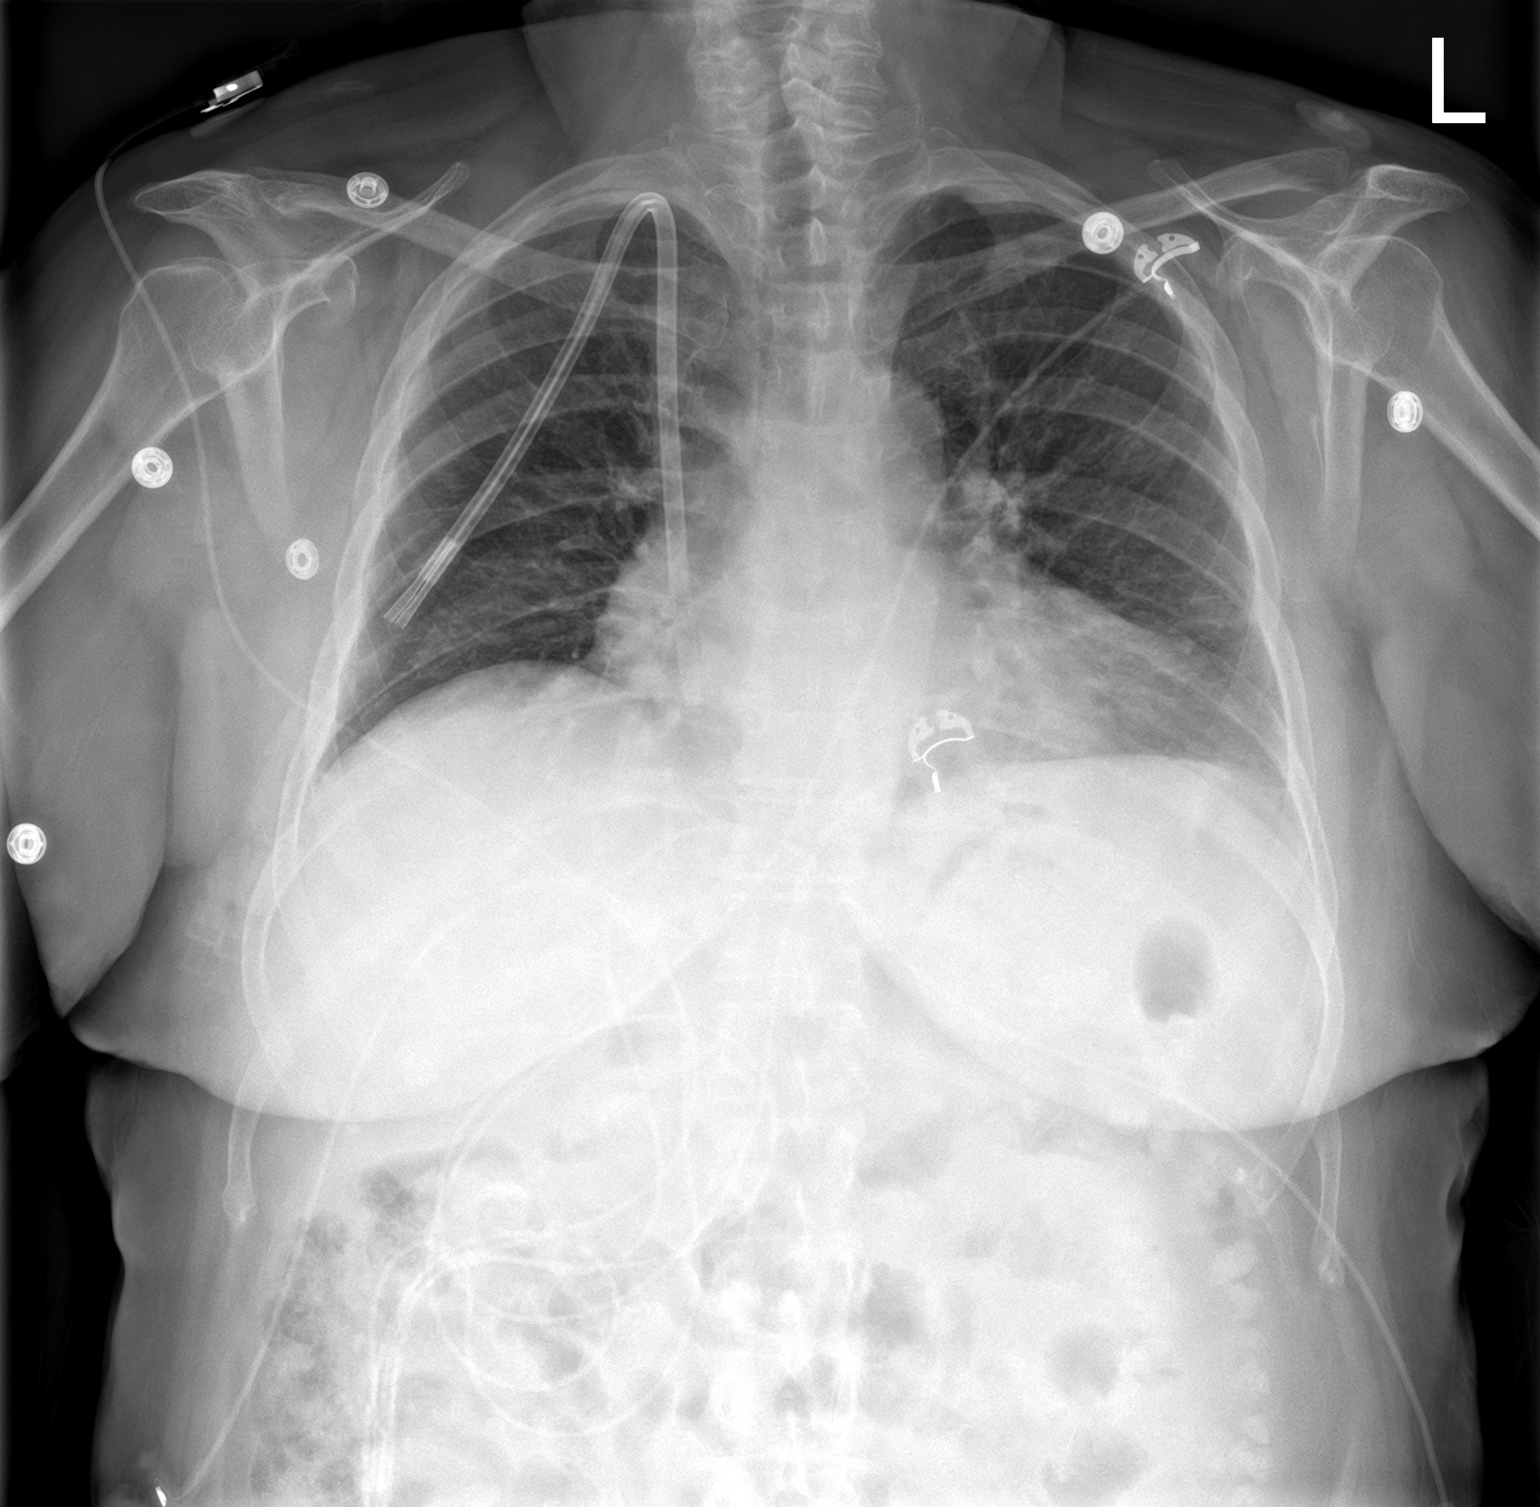

[chest lat]
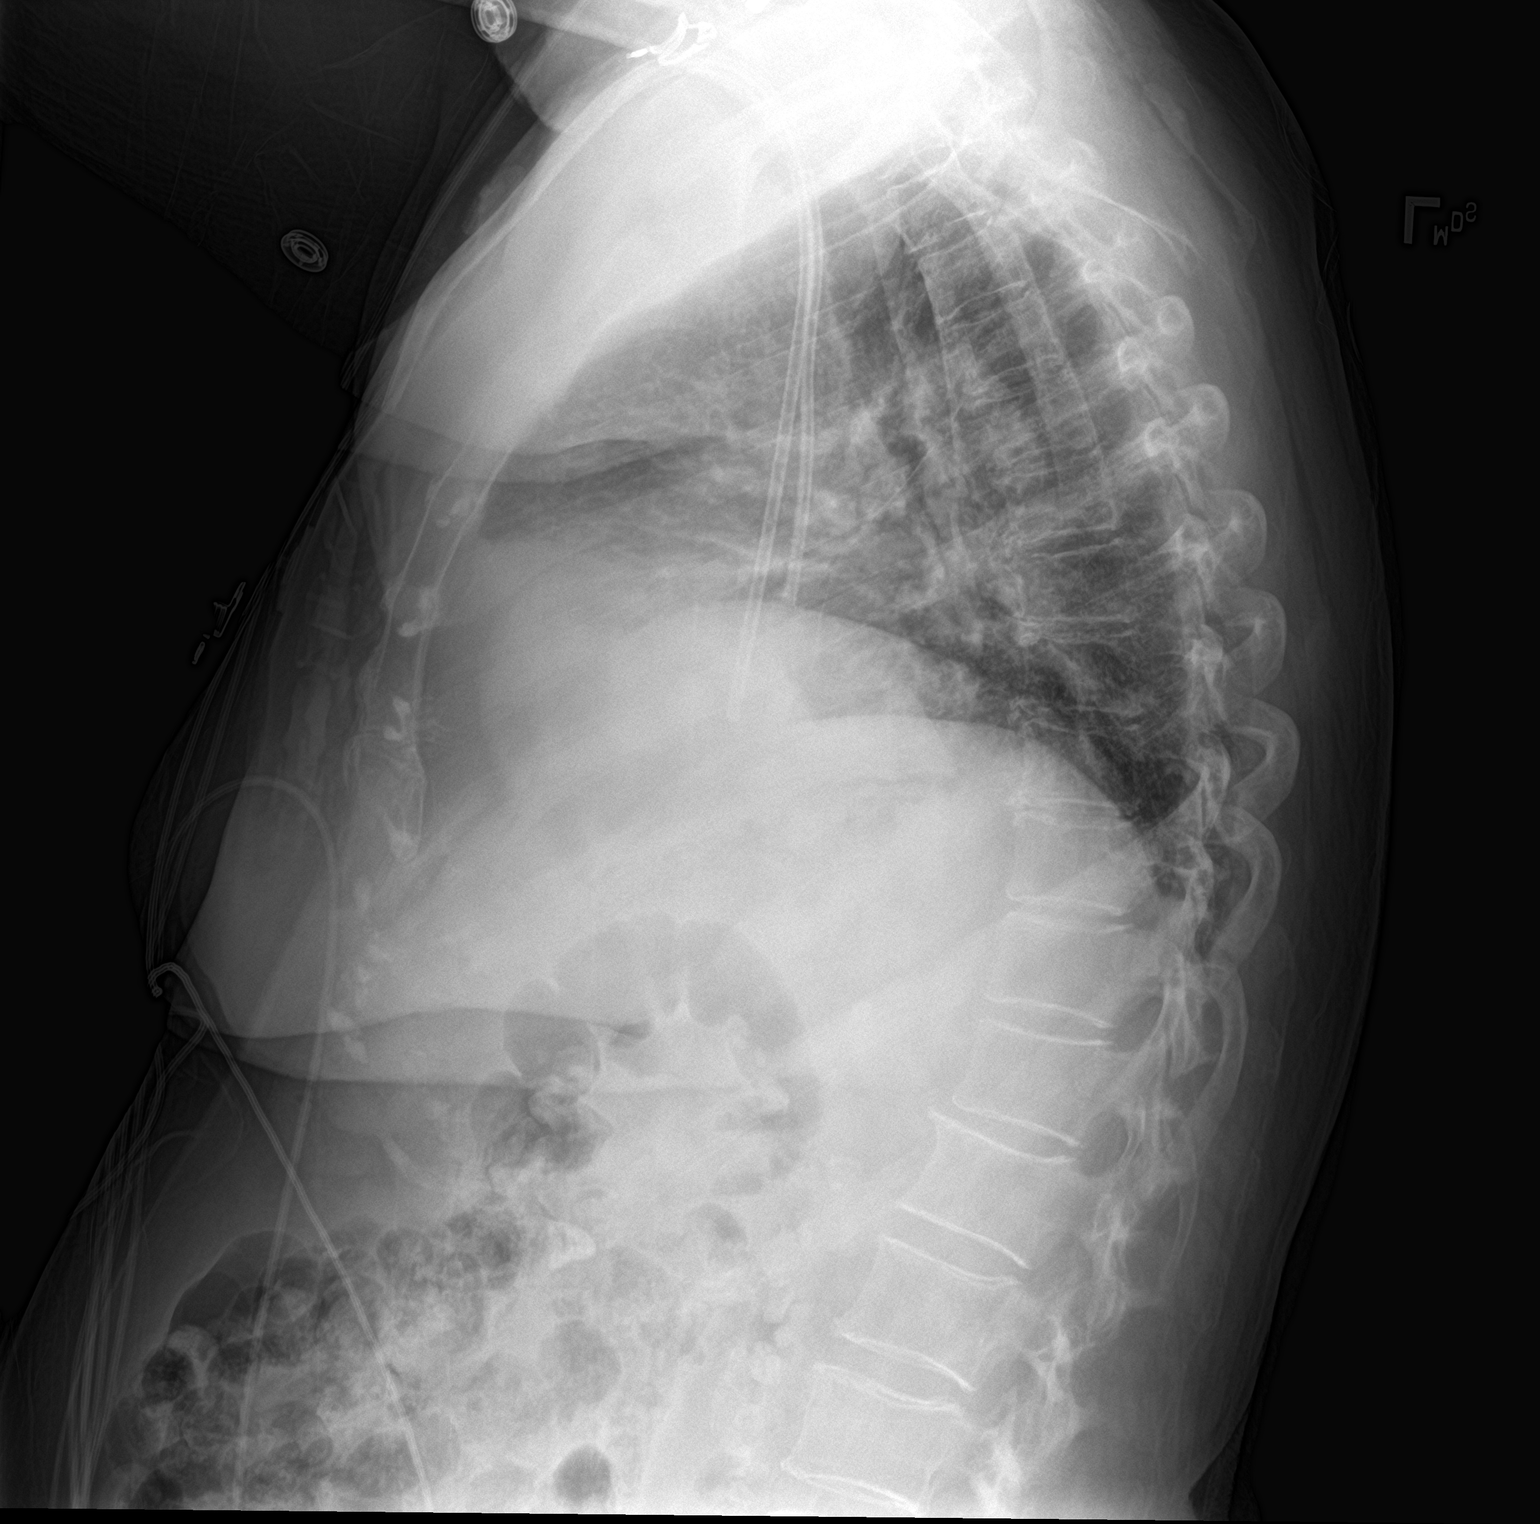

[2 of 2 positions shown; findings below may reference images not displayed]

FINDINGS: Heart is enlarged, exaggerated by low lung volumes. Atherosclerotic
changes are noted at the aortic arch. No edema or effusion is
present. No focal airspace disease is evident.

Right IJ dialysis catheter terminates at the right atrium. No
pneumothorax is present. Soft tissues are otherwise unremarkable.
IMPRESSION: 1. Cardiomegaly without failure.
2. Right IJ dialysis catheter terminates at the right atrium.

## 2022-03-29 ENCOUNTER — Other Ambulatory Visit (INDEPENDENT_AMBULATORY_CARE_PROVIDER_SITE_OTHER): Payer: Self-pay | Admitting: Vascular Surgery

## 2022-03-29 DIAGNOSIS — N186 End stage renal disease: Secondary | ICD-10-CM

## 2022-03-29 DIAGNOSIS — Z992 Dependence on renal dialysis: Secondary | ICD-10-CM | POA: Diagnosis not present

## 2022-03-29 DIAGNOSIS — N2581 Secondary hyperparathyroidism of renal origin: Secondary | ICD-10-CM | POA: Diagnosis not present

## 2022-03-29 DIAGNOSIS — I120 Hypertensive chronic kidney disease with stage 5 chronic kidney disease or end stage renal disease: Secondary | ICD-10-CM | POA: Diagnosis not present

## 2022-04-01 DIAGNOSIS — N2581 Secondary hyperparathyroidism of renal origin: Secondary | ICD-10-CM | POA: Diagnosis not present

## 2022-04-01 DIAGNOSIS — Z992 Dependence on renal dialysis: Secondary | ICD-10-CM | POA: Diagnosis not present

## 2022-04-01 DIAGNOSIS — N186 End stage renal disease: Secondary | ICD-10-CM | POA: Diagnosis not present

## 2022-04-03 DIAGNOSIS — N186 End stage renal disease: Secondary | ICD-10-CM | POA: Diagnosis not present

## 2022-04-03 DIAGNOSIS — N2581 Secondary hyperparathyroidism of renal origin: Secondary | ICD-10-CM | POA: Diagnosis not present

## 2022-04-03 DIAGNOSIS — Z992 Dependence on renal dialysis: Secondary | ICD-10-CM | POA: Diagnosis not present

## 2022-04-04 ENCOUNTER — Ambulatory Visit (INDEPENDENT_AMBULATORY_CARE_PROVIDER_SITE_OTHER): Payer: Medicare HMO | Admitting: Nurse Practitioner

## 2022-04-04 ENCOUNTER — Encounter (INDEPENDENT_AMBULATORY_CARE_PROVIDER_SITE_OTHER): Payer: Medicare HMO

## 2022-04-05 DIAGNOSIS — N186 End stage renal disease: Secondary | ICD-10-CM | POA: Diagnosis not present

## 2022-04-05 DIAGNOSIS — N2581 Secondary hyperparathyroidism of renal origin: Secondary | ICD-10-CM | POA: Diagnosis not present

## 2022-04-05 DIAGNOSIS — Z992 Dependence on renal dialysis: Secondary | ICD-10-CM | POA: Diagnosis not present

## 2022-04-10 DIAGNOSIS — Z992 Dependence on renal dialysis: Secondary | ICD-10-CM | POA: Diagnosis not present

## 2022-04-10 DIAGNOSIS — N2581 Secondary hyperparathyroidism of renal origin: Secondary | ICD-10-CM | POA: Diagnosis not present

## 2022-04-10 DIAGNOSIS — N186 End stage renal disease: Secondary | ICD-10-CM | POA: Diagnosis not present

## 2022-04-12 DIAGNOSIS — N2581 Secondary hyperparathyroidism of renal origin: Secondary | ICD-10-CM | POA: Diagnosis not present

## 2022-04-12 DIAGNOSIS — N186 End stage renal disease: Secondary | ICD-10-CM | POA: Diagnosis not present

## 2022-04-12 DIAGNOSIS — Z992 Dependence on renal dialysis: Secondary | ICD-10-CM | POA: Diagnosis not present

## 2022-04-15 DIAGNOSIS — N186 End stage renal disease: Secondary | ICD-10-CM | POA: Diagnosis not present

## 2022-04-15 DIAGNOSIS — Z992 Dependence on renal dialysis: Secondary | ICD-10-CM | POA: Diagnosis not present

## 2022-04-15 DIAGNOSIS — N2581 Secondary hyperparathyroidism of renal origin: Secondary | ICD-10-CM | POA: Diagnosis not present

## 2022-04-16 DIAGNOSIS — N186 End stage renal disease: Secondary | ICD-10-CM | POA: Diagnosis not present

## 2022-04-16 DIAGNOSIS — I120 Hypertensive chronic kidney disease with stage 5 chronic kidney disease or end stage renal disease: Secondary | ICD-10-CM | POA: Diagnosis not present

## 2022-04-19 DIAGNOSIS — N186 End stage renal disease: Secondary | ICD-10-CM | POA: Diagnosis not present

## 2022-04-19 DIAGNOSIS — N2581 Secondary hyperparathyroidism of renal origin: Secondary | ICD-10-CM | POA: Diagnosis not present

## 2022-04-19 DIAGNOSIS — Z992 Dependence on renal dialysis: Secondary | ICD-10-CM | POA: Diagnosis not present

## 2022-04-22 DIAGNOSIS — N186 End stage renal disease: Secondary | ICD-10-CM | POA: Diagnosis not present

## 2022-04-22 DIAGNOSIS — N2581 Secondary hyperparathyroidism of renal origin: Secondary | ICD-10-CM | POA: Diagnosis not present

## 2022-04-22 DIAGNOSIS — Z992 Dependence on renal dialysis: Secondary | ICD-10-CM | POA: Diagnosis not present

## 2022-04-29 DIAGNOSIS — N2581 Secondary hyperparathyroidism of renal origin: Secondary | ICD-10-CM | POA: Diagnosis not present

## 2022-04-29 DIAGNOSIS — N186 End stage renal disease: Secondary | ICD-10-CM | POA: Diagnosis not present

## 2022-04-29 DIAGNOSIS — Z992 Dependence on renal dialysis: Secondary | ICD-10-CM | POA: Diagnosis not present

## 2022-04-29 DIAGNOSIS — I120 Hypertensive chronic kidney disease with stage 5 chronic kidney disease or end stage renal disease: Secondary | ICD-10-CM | POA: Diagnosis not present

## 2022-05-03 DIAGNOSIS — Z992 Dependence on renal dialysis: Secondary | ICD-10-CM | POA: Diagnosis not present

## 2022-05-03 DIAGNOSIS — N186 End stage renal disease: Secondary | ICD-10-CM | POA: Diagnosis not present

## 2022-05-03 DIAGNOSIS — N2581 Secondary hyperparathyroidism of renal origin: Secondary | ICD-10-CM | POA: Diagnosis not present

## 2022-05-06 DIAGNOSIS — N186 End stage renal disease: Secondary | ICD-10-CM | POA: Diagnosis not present

## 2022-05-06 DIAGNOSIS — Z992 Dependence on renal dialysis: Secondary | ICD-10-CM | POA: Diagnosis not present

## 2022-05-06 DIAGNOSIS — N2581 Secondary hyperparathyroidism of renal origin: Secondary | ICD-10-CM | POA: Diagnosis not present

## 2022-05-08 DIAGNOSIS — Z992 Dependence on renal dialysis: Secondary | ICD-10-CM | POA: Diagnosis not present

## 2022-05-08 DIAGNOSIS — N186 End stage renal disease: Secondary | ICD-10-CM | POA: Diagnosis not present

## 2022-05-08 DIAGNOSIS — N2581 Secondary hyperparathyroidism of renal origin: Secondary | ICD-10-CM | POA: Diagnosis not present

## 2022-05-13 DIAGNOSIS — N2581 Secondary hyperparathyroidism of renal origin: Secondary | ICD-10-CM | POA: Diagnosis not present

## 2022-05-13 DIAGNOSIS — Z992 Dependence on renal dialysis: Secondary | ICD-10-CM | POA: Diagnosis not present

## 2022-05-13 DIAGNOSIS — N186 End stage renal disease: Secondary | ICD-10-CM | POA: Diagnosis not present

## 2022-05-15 DIAGNOSIS — N186 End stage renal disease: Secondary | ICD-10-CM | POA: Diagnosis not present

## 2022-05-15 DIAGNOSIS — N2581 Secondary hyperparathyroidism of renal origin: Secondary | ICD-10-CM | POA: Diagnosis not present

## 2022-05-15 DIAGNOSIS — Z992 Dependence on renal dialysis: Secondary | ICD-10-CM | POA: Diagnosis not present

## 2022-05-17 DIAGNOSIS — N186 End stage renal disease: Secondary | ICD-10-CM | POA: Diagnosis not present

## 2022-05-17 DIAGNOSIS — Z992 Dependence on renal dialysis: Secondary | ICD-10-CM | POA: Diagnosis not present

## 2022-05-17 DIAGNOSIS — N2581 Secondary hyperparathyroidism of renal origin: Secondary | ICD-10-CM | POA: Diagnosis not present

## 2022-05-20 DIAGNOSIS — Z992 Dependence on renal dialysis: Secondary | ICD-10-CM | POA: Diagnosis not present

## 2022-05-20 DIAGNOSIS — N186 End stage renal disease: Secondary | ICD-10-CM | POA: Diagnosis not present

## 2022-05-20 DIAGNOSIS — N2581 Secondary hyperparathyroidism of renal origin: Secondary | ICD-10-CM | POA: Diagnosis not present

## 2022-05-21 DIAGNOSIS — L989 Disorder of the skin and subcutaneous tissue, unspecified: Secondary | ICD-10-CM | POA: Diagnosis not present

## 2022-05-21 DIAGNOSIS — Z992 Dependence on renal dialysis: Secondary | ICD-10-CM | POA: Diagnosis not present

## 2022-05-21 DIAGNOSIS — I209 Angina pectoris, unspecified: Secondary | ICD-10-CM | POA: Diagnosis not present

## 2022-05-21 DIAGNOSIS — I120 Hypertensive chronic kidney disease with stage 5 chronic kidney disease or end stage renal disease: Secondary | ICD-10-CM | POA: Diagnosis not present

## 2022-05-21 DIAGNOSIS — N186 End stage renal disease: Secondary | ICD-10-CM | POA: Diagnosis not present

## 2022-05-21 DIAGNOSIS — R0789 Other chest pain: Secondary | ICD-10-CM | POA: Diagnosis not present

## 2022-05-22 DIAGNOSIS — Z992 Dependence on renal dialysis: Secondary | ICD-10-CM | POA: Diagnosis not present

## 2022-05-22 DIAGNOSIS — N186 End stage renal disease: Secondary | ICD-10-CM | POA: Diagnosis not present

## 2022-05-22 DIAGNOSIS — N2581 Secondary hyperparathyroidism of renal origin: Secondary | ICD-10-CM | POA: Diagnosis not present

## 2022-05-24 DIAGNOSIS — Z992 Dependence on renal dialysis: Secondary | ICD-10-CM | POA: Diagnosis not present

## 2022-05-24 DIAGNOSIS — N2581 Secondary hyperparathyroidism of renal origin: Secondary | ICD-10-CM | POA: Diagnosis not present

## 2022-05-24 DIAGNOSIS — N186 End stage renal disease: Secondary | ICD-10-CM | POA: Diagnosis not present

## 2022-05-30 DIAGNOSIS — I120 Hypertensive chronic kidney disease with stage 5 chronic kidney disease or end stage renal disease: Secondary | ICD-10-CM | POA: Diagnosis not present

## 2022-05-30 DIAGNOSIS — Z992 Dependence on renal dialysis: Secondary | ICD-10-CM | POA: Diagnosis not present

## 2022-05-30 DIAGNOSIS — N186 End stage renal disease: Secondary | ICD-10-CM | POA: Diagnosis not present

## 2022-06-05 DIAGNOSIS — Z992 Dependence on renal dialysis: Secondary | ICD-10-CM | POA: Diagnosis not present

## 2022-06-05 DIAGNOSIS — N186 End stage renal disease: Secondary | ICD-10-CM | POA: Diagnosis not present

## 2022-06-05 DIAGNOSIS — N2581 Secondary hyperparathyroidism of renal origin: Secondary | ICD-10-CM | POA: Diagnosis not present

## 2022-06-10 DIAGNOSIS — N186 End stage renal disease: Secondary | ICD-10-CM | POA: Diagnosis not present

## 2022-06-10 DIAGNOSIS — N2581 Secondary hyperparathyroidism of renal origin: Secondary | ICD-10-CM | POA: Diagnosis not present

## 2022-06-10 DIAGNOSIS — Z992 Dependence on renal dialysis: Secondary | ICD-10-CM | POA: Diagnosis not present

## 2022-06-12 DIAGNOSIS — N186 End stage renal disease: Secondary | ICD-10-CM | POA: Diagnosis not present

## 2022-06-12 DIAGNOSIS — Z992 Dependence on renal dialysis: Secondary | ICD-10-CM | POA: Diagnosis not present

## 2022-06-12 DIAGNOSIS — N2581 Secondary hyperparathyroidism of renal origin: Secondary | ICD-10-CM | POA: Diagnosis not present

## 2022-06-13 IMAGING — CR DG CHEST 2V
1 series · 2 of 2 positions shown · non-contrast
Comparison: 04/11/2020.

CLINICAL DATA: TDC positioning.  Chronic renal disease.

EXAM:
CHEST - 2 VIEW

[Series 1: dg chest 2 view · 0.14mm/px · 2 of 2 slices shown]
[im 1/2]
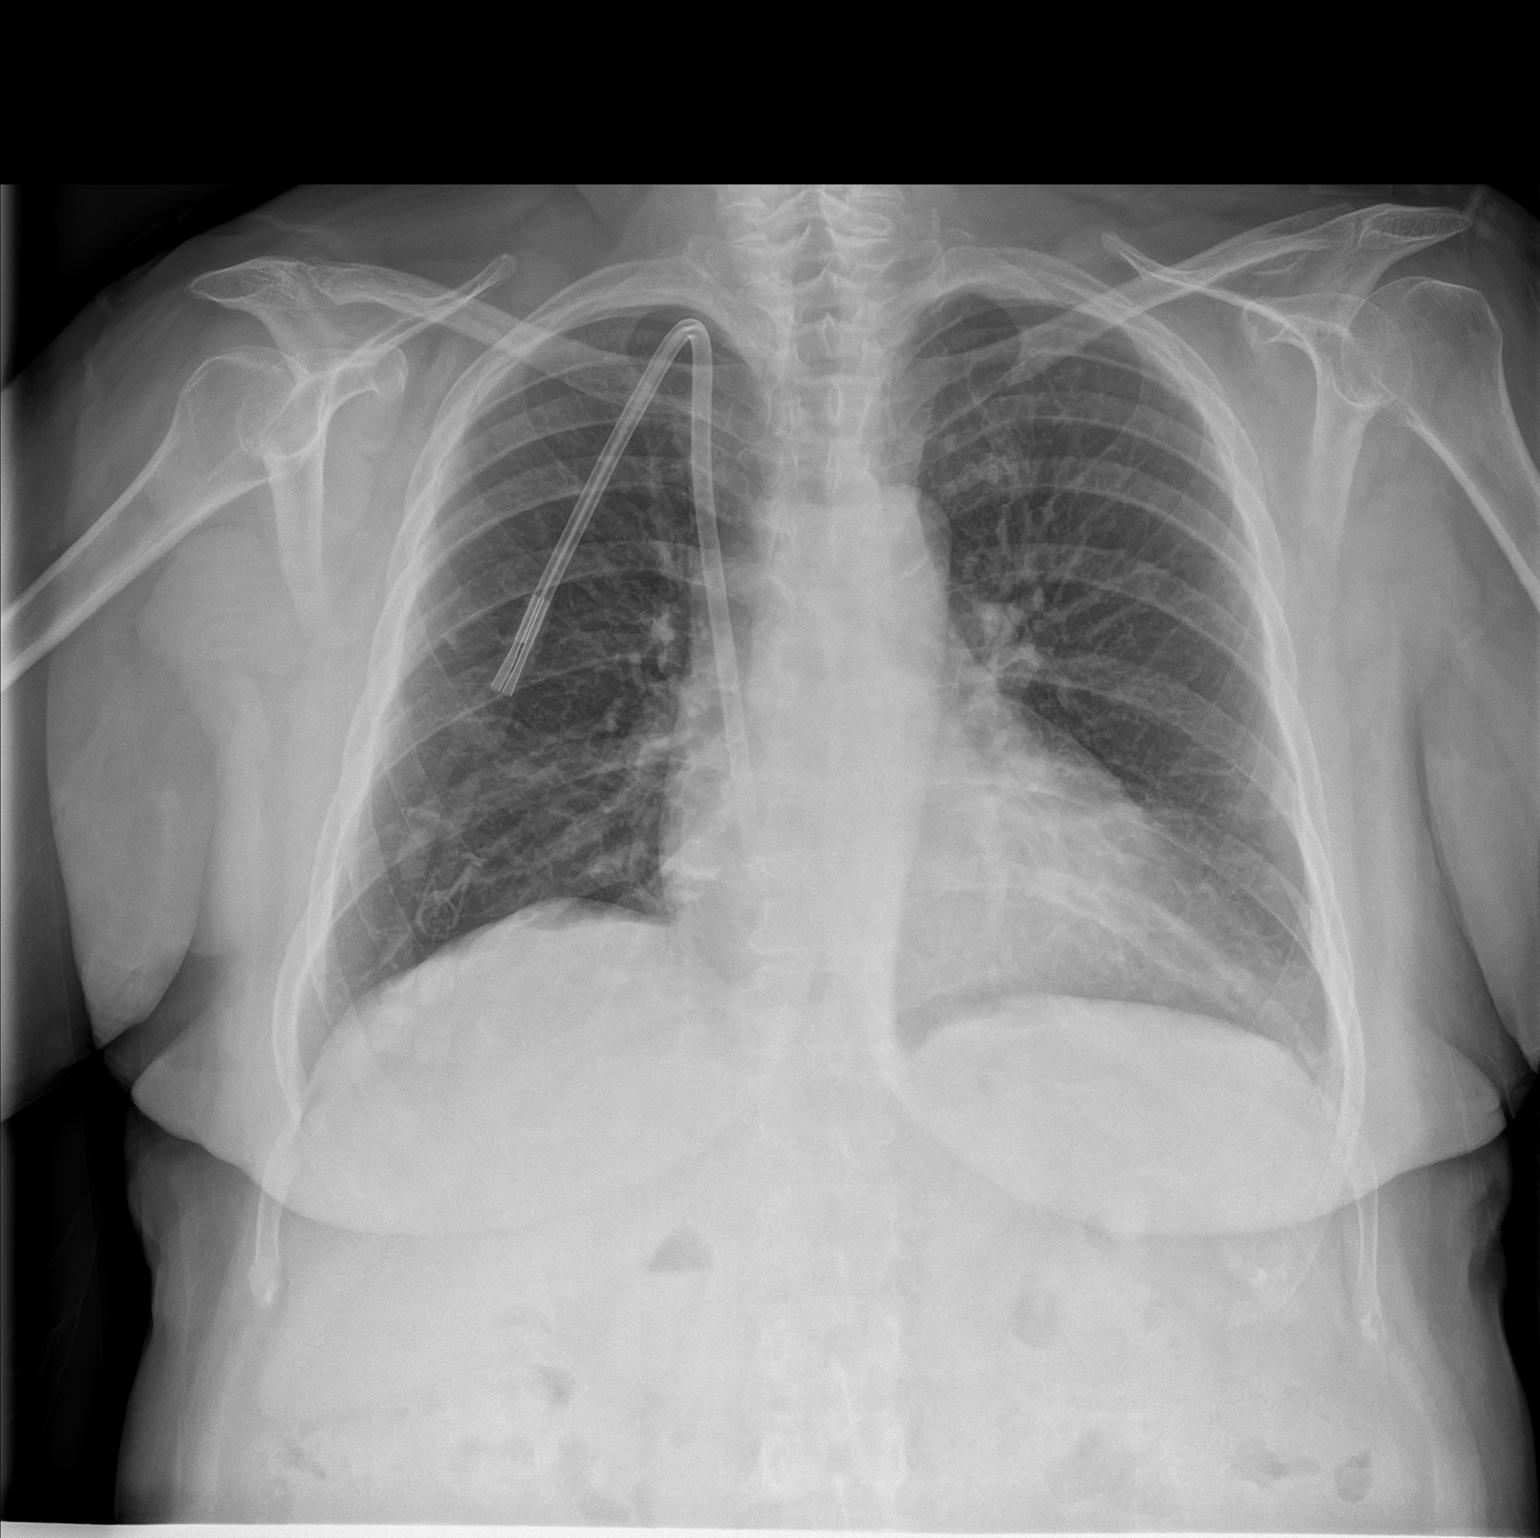
[im 2/2]
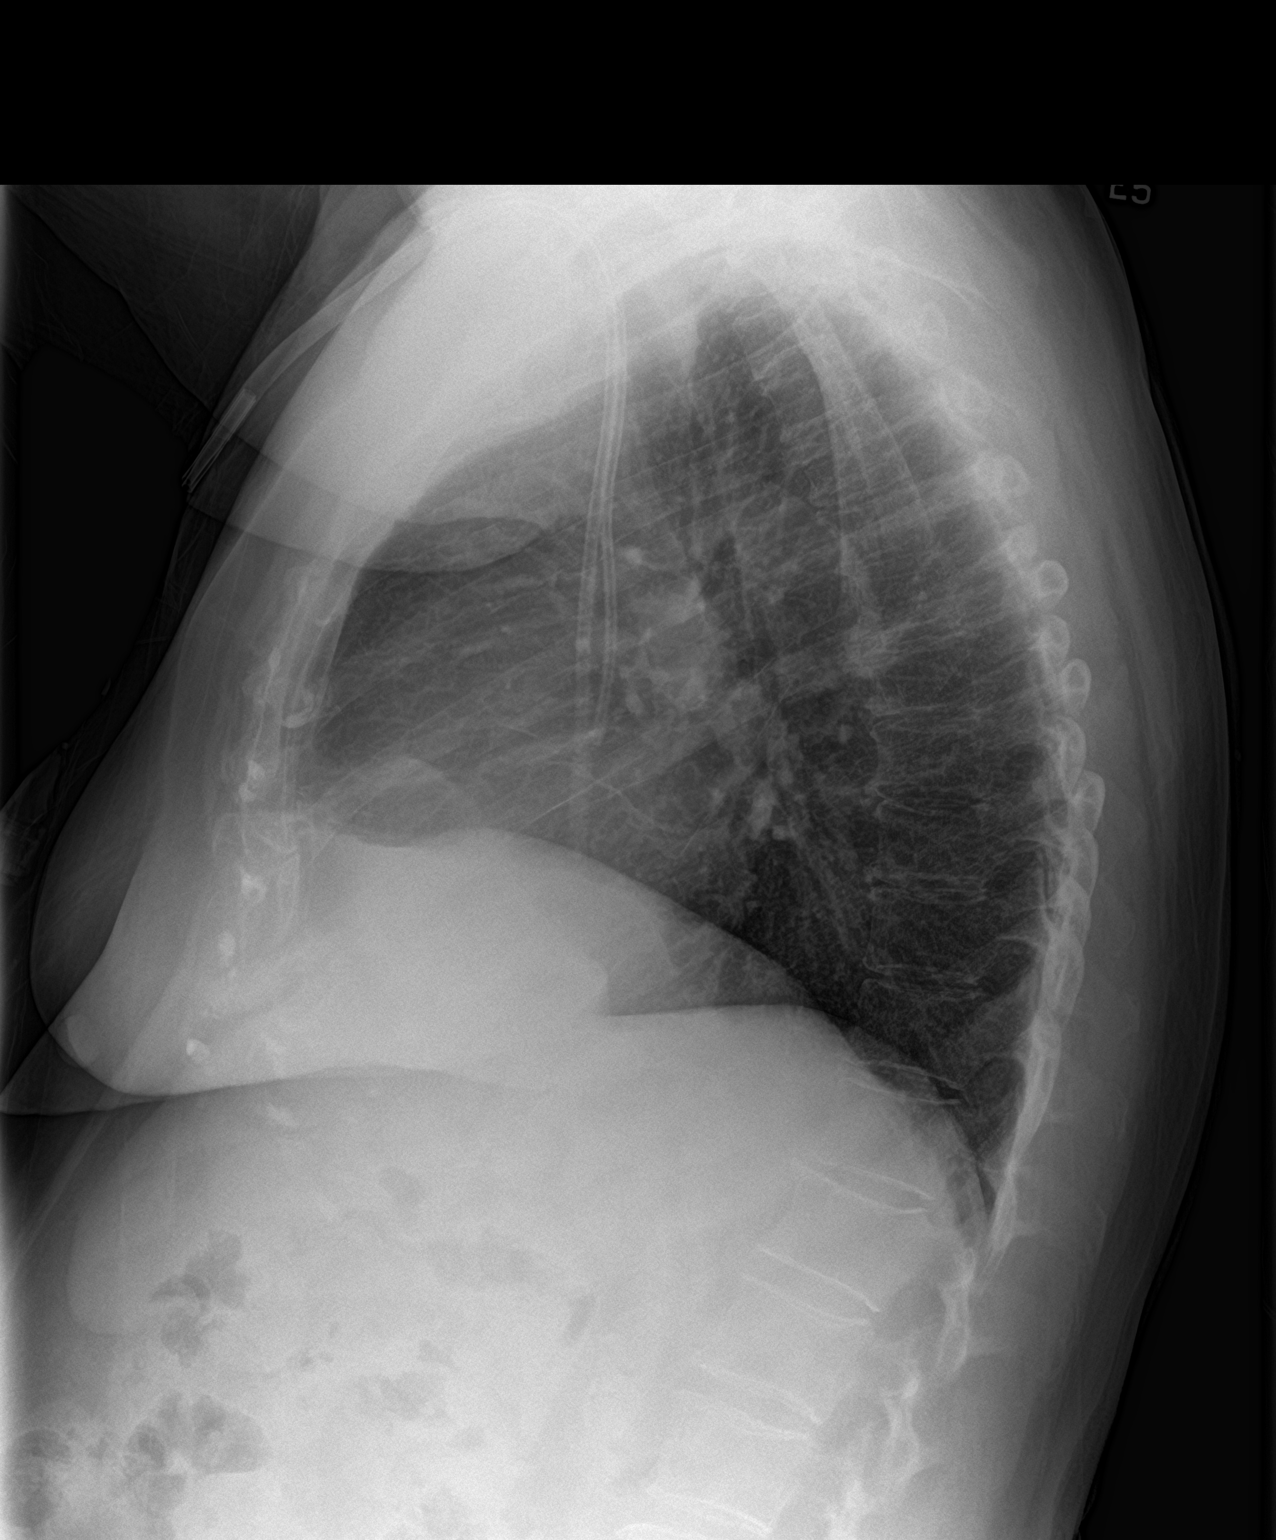

[2 of 2 positions shown; findings below may reference images not displayed]

FINDINGS: Dual-lumen central venous catheter noted with tip over right atrium.
Heart size normal. Low lung volumes. Mild bibasilar atelectasis. No
focal infiltrate. No pleural effusion or pneumothorax. Mild thoracic
spine scoliosis.
IMPRESSION: 1.  Dual-lumen central venous catheter with tip over right atrium.

2.  Low lung volumes with mild bibasilar atelectasis.

## 2022-06-14 DIAGNOSIS — N2581 Secondary hyperparathyroidism of renal origin: Secondary | ICD-10-CM | POA: Diagnosis not present

## 2022-06-14 DIAGNOSIS — Z992 Dependence on renal dialysis: Secondary | ICD-10-CM | POA: Diagnosis not present

## 2022-06-14 DIAGNOSIS — N186 End stage renal disease: Secondary | ICD-10-CM | POA: Diagnosis not present

## 2022-06-17 DIAGNOSIS — Z992 Dependence on renal dialysis: Secondary | ICD-10-CM | POA: Diagnosis not present

## 2022-06-17 DIAGNOSIS — N2581 Secondary hyperparathyroidism of renal origin: Secondary | ICD-10-CM | POA: Diagnosis not present

## 2022-06-17 DIAGNOSIS — N186 End stage renal disease: Secondary | ICD-10-CM | POA: Diagnosis not present

## 2022-06-19 DIAGNOSIS — N186 End stage renal disease: Secondary | ICD-10-CM | POA: Diagnosis not present

## 2022-06-19 DIAGNOSIS — N2581 Secondary hyperparathyroidism of renal origin: Secondary | ICD-10-CM | POA: Diagnosis not present

## 2022-06-19 DIAGNOSIS — Z992 Dependence on renal dialysis: Secondary | ICD-10-CM | POA: Diagnosis not present

## 2022-06-21 DIAGNOSIS — N2581 Secondary hyperparathyroidism of renal origin: Secondary | ICD-10-CM | POA: Diagnosis not present

## 2022-06-21 DIAGNOSIS — Z992 Dependence on renal dialysis: Secondary | ICD-10-CM | POA: Diagnosis not present

## 2022-06-21 DIAGNOSIS — N186 End stage renal disease: Secondary | ICD-10-CM | POA: Diagnosis not present

## 2022-06-24 DIAGNOSIS — Z79899 Other long term (current) drug therapy: Secondary | ICD-10-CM | POA: Diagnosis not present

## 2022-06-24 DIAGNOSIS — N186 End stage renal disease: Secondary | ICD-10-CM | POA: Diagnosis not present

## 2022-06-24 DIAGNOSIS — N2581 Secondary hyperparathyroidism of renal origin: Secondary | ICD-10-CM | POA: Diagnosis not present

## 2022-06-24 DIAGNOSIS — Z992 Dependence on renal dialysis: Secondary | ICD-10-CM | POA: Diagnosis not present

## 2022-06-26 DIAGNOSIS — Z992 Dependence on renal dialysis: Secondary | ICD-10-CM | POA: Diagnosis not present

## 2022-06-26 DIAGNOSIS — N186 End stage renal disease: Secondary | ICD-10-CM | POA: Diagnosis not present

## 2022-06-26 DIAGNOSIS — N2581 Secondary hyperparathyroidism of renal origin: Secondary | ICD-10-CM | POA: Diagnosis not present

## 2022-06-28 DIAGNOSIS — N2581 Secondary hyperparathyroidism of renal origin: Secondary | ICD-10-CM | POA: Diagnosis not present

## 2022-06-28 DIAGNOSIS — N186 End stage renal disease: Secondary | ICD-10-CM | POA: Diagnosis not present

## 2022-06-28 DIAGNOSIS — Z992 Dependence on renal dialysis: Secondary | ICD-10-CM | POA: Diagnosis not present

## 2022-06-29 DIAGNOSIS — I120 Hypertensive chronic kidney disease with stage 5 chronic kidney disease or end stage renal disease: Secondary | ICD-10-CM | POA: Diagnosis not present

## 2022-06-29 DIAGNOSIS — Z992 Dependence on renal dialysis: Secondary | ICD-10-CM | POA: Diagnosis not present

## 2022-06-29 DIAGNOSIS — N186 End stage renal disease: Secondary | ICD-10-CM | POA: Diagnosis not present

## 2022-07-01 DIAGNOSIS — N186 End stage renal disease: Secondary | ICD-10-CM | POA: Diagnosis not present

## 2022-07-01 DIAGNOSIS — N2581 Secondary hyperparathyroidism of renal origin: Secondary | ICD-10-CM | POA: Diagnosis not present

## 2022-07-01 DIAGNOSIS — Z992 Dependence on renal dialysis: Secondary | ICD-10-CM | POA: Diagnosis not present

## 2022-07-03 DIAGNOSIS — N186 End stage renal disease: Secondary | ICD-10-CM | POA: Diagnosis not present

## 2022-07-03 DIAGNOSIS — N2581 Secondary hyperparathyroidism of renal origin: Secondary | ICD-10-CM | POA: Diagnosis not present

## 2022-07-03 DIAGNOSIS — Z992 Dependence on renal dialysis: Secondary | ICD-10-CM | POA: Diagnosis not present

## 2022-07-05 DIAGNOSIS — Z992 Dependence on renal dialysis: Secondary | ICD-10-CM | POA: Diagnosis not present

## 2022-07-05 DIAGNOSIS — N2581 Secondary hyperparathyroidism of renal origin: Secondary | ICD-10-CM | POA: Diagnosis not present

## 2022-07-05 DIAGNOSIS — N186 End stage renal disease: Secondary | ICD-10-CM | POA: Diagnosis not present

## 2022-07-08 DIAGNOSIS — N2581 Secondary hyperparathyroidism of renal origin: Secondary | ICD-10-CM | POA: Diagnosis not present

## 2022-07-08 DIAGNOSIS — N186 End stage renal disease: Secondary | ICD-10-CM | POA: Diagnosis not present

## 2022-07-08 DIAGNOSIS — Z992 Dependence on renal dialysis: Secondary | ICD-10-CM | POA: Diagnosis not present

## 2022-07-10 DIAGNOSIS — Z992 Dependence on renal dialysis: Secondary | ICD-10-CM | POA: Diagnosis not present

## 2022-07-10 DIAGNOSIS — N2581 Secondary hyperparathyroidism of renal origin: Secondary | ICD-10-CM | POA: Diagnosis not present

## 2022-07-10 DIAGNOSIS — N186 End stage renal disease: Secondary | ICD-10-CM | POA: Diagnosis not present

## 2022-07-12 DIAGNOSIS — Z992 Dependence on renal dialysis: Secondary | ICD-10-CM | POA: Diagnosis not present

## 2022-07-12 DIAGNOSIS — N2581 Secondary hyperparathyroidism of renal origin: Secondary | ICD-10-CM | POA: Diagnosis not present

## 2022-07-12 DIAGNOSIS — N186 End stage renal disease: Secondary | ICD-10-CM | POA: Diagnosis not present

## 2022-07-15 DIAGNOSIS — Z992 Dependence on renal dialysis: Secondary | ICD-10-CM | POA: Diagnosis not present

## 2022-07-15 DIAGNOSIS — N186 End stage renal disease: Secondary | ICD-10-CM | POA: Diagnosis not present

## 2022-07-15 DIAGNOSIS — N2581 Secondary hyperparathyroidism of renal origin: Secondary | ICD-10-CM | POA: Diagnosis not present

## 2022-07-17 DIAGNOSIS — N186 End stage renal disease: Secondary | ICD-10-CM | POA: Diagnosis not present

## 2022-07-17 DIAGNOSIS — Z992 Dependence on renal dialysis: Secondary | ICD-10-CM | POA: Diagnosis not present

## 2022-07-17 DIAGNOSIS — N2581 Secondary hyperparathyroidism of renal origin: Secondary | ICD-10-CM | POA: Diagnosis not present

## 2022-07-18 DIAGNOSIS — R42 Dizziness and giddiness: Secondary | ICD-10-CM | POA: Diagnosis not present

## 2022-07-18 DIAGNOSIS — Z992 Dependence on renal dialysis: Secondary | ICD-10-CM | POA: Diagnosis not present

## 2022-07-18 DIAGNOSIS — I120 Hypertensive chronic kidney disease with stage 5 chronic kidney disease or end stage renal disease: Secondary | ICD-10-CM | POA: Diagnosis not present

## 2022-07-18 DIAGNOSIS — Z79899 Other long term (current) drug therapy: Secondary | ICD-10-CM | POA: Diagnosis not present

## 2022-07-18 DIAGNOSIS — R7303 Prediabetes: Secondary | ICD-10-CM | POA: Diagnosis not present

## 2022-07-18 DIAGNOSIS — I10 Essential (primary) hypertension: Secondary | ICD-10-CM | POA: Diagnosis not present

## 2022-07-18 DIAGNOSIS — N186 End stage renal disease: Secondary | ICD-10-CM | POA: Diagnosis not present

## 2022-07-19 DIAGNOSIS — N186 End stage renal disease: Secondary | ICD-10-CM | POA: Diagnosis not present

## 2022-07-19 DIAGNOSIS — Z992 Dependence on renal dialysis: Secondary | ICD-10-CM | POA: Diagnosis not present

## 2022-07-19 DIAGNOSIS — N2581 Secondary hyperparathyroidism of renal origin: Secondary | ICD-10-CM | POA: Diagnosis not present

## 2022-07-22 DIAGNOSIS — Z992 Dependence on renal dialysis: Secondary | ICD-10-CM | POA: Diagnosis not present

## 2022-07-22 DIAGNOSIS — N186 End stage renal disease: Secondary | ICD-10-CM | POA: Diagnosis not present

## 2022-07-22 DIAGNOSIS — N2581 Secondary hyperparathyroidism of renal origin: Secondary | ICD-10-CM | POA: Diagnosis not present

## 2022-07-24 DIAGNOSIS — N2581 Secondary hyperparathyroidism of renal origin: Secondary | ICD-10-CM | POA: Diagnosis not present

## 2022-07-24 DIAGNOSIS — N186 End stage renal disease: Secondary | ICD-10-CM | POA: Diagnosis not present

## 2022-07-24 DIAGNOSIS — Z992 Dependence on renal dialysis: Secondary | ICD-10-CM | POA: Diagnosis not present

## 2022-07-26 DIAGNOSIS — N2581 Secondary hyperparathyroidism of renal origin: Secondary | ICD-10-CM | POA: Diagnosis not present

## 2022-07-26 DIAGNOSIS — Z992 Dependence on renal dialysis: Secondary | ICD-10-CM | POA: Diagnosis not present

## 2022-07-26 DIAGNOSIS — N186 End stage renal disease: Secondary | ICD-10-CM | POA: Diagnosis not present

## 2022-07-29 DIAGNOSIS — Z992 Dependence on renal dialysis: Secondary | ICD-10-CM | POA: Diagnosis not present

## 2022-07-29 DIAGNOSIS — N2581 Secondary hyperparathyroidism of renal origin: Secondary | ICD-10-CM | POA: Diagnosis not present

## 2022-07-29 DIAGNOSIS — N186 End stage renal disease: Secondary | ICD-10-CM | POA: Diagnosis not present

## 2022-07-30 DIAGNOSIS — Z992 Dependence on renal dialysis: Secondary | ICD-10-CM | POA: Diagnosis not present

## 2022-07-30 DIAGNOSIS — N186 End stage renal disease: Secondary | ICD-10-CM | POA: Diagnosis not present

## 2022-07-30 DIAGNOSIS — I120 Hypertensive chronic kidney disease with stage 5 chronic kidney disease or end stage renal disease: Secondary | ICD-10-CM | POA: Diagnosis not present

## 2022-07-31 DIAGNOSIS — N186 End stage renal disease: Secondary | ICD-10-CM | POA: Diagnosis not present

## 2022-07-31 DIAGNOSIS — Z992 Dependence on renal dialysis: Secondary | ICD-10-CM | POA: Diagnosis not present

## 2022-07-31 DIAGNOSIS — N2581 Secondary hyperparathyroidism of renal origin: Secondary | ICD-10-CM | POA: Diagnosis not present

## 2022-08-02 DIAGNOSIS — Z992 Dependence on renal dialysis: Secondary | ICD-10-CM | POA: Diagnosis not present

## 2022-08-02 DIAGNOSIS — N2581 Secondary hyperparathyroidism of renal origin: Secondary | ICD-10-CM | POA: Diagnosis not present

## 2022-08-02 DIAGNOSIS — N186 End stage renal disease: Secondary | ICD-10-CM | POA: Diagnosis not present

## 2022-08-05 DIAGNOSIS — Z992 Dependence on renal dialysis: Secondary | ICD-10-CM | POA: Diagnosis not present

## 2022-08-05 DIAGNOSIS — N2581 Secondary hyperparathyroidism of renal origin: Secondary | ICD-10-CM | POA: Diagnosis not present

## 2022-08-05 DIAGNOSIS — N186 End stage renal disease: Secondary | ICD-10-CM | POA: Diagnosis not present

## 2022-08-08 ENCOUNTER — Encounter: Payer: Self-pay | Admitting: Certified Registered"

## 2022-08-08 ENCOUNTER — Ambulatory Visit: Admission: RE | Admit: 2022-08-08 | Payer: Medicare HMO | Source: Home / Self Care | Admitting: Gastroenterology

## 2022-08-08 ENCOUNTER — Encounter: Admission: RE | Payer: Self-pay | Source: Home / Self Care

## 2022-08-08 SURGERY — COLONOSCOPY
Anesthesia: General

## 2022-08-09 DIAGNOSIS — Z992 Dependence on renal dialysis: Secondary | ICD-10-CM | POA: Diagnosis not present

## 2022-08-09 DIAGNOSIS — N2581 Secondary hyperparathyroidism of renal origin: Secondary | ICD-10-CM | POA: Diagnosis not present

## 2022-08-09 DIAGNOSIS — N186 End stage renal disease: Secondary | ICD-10-CM | POA: Diagnosis not present

## 2022-08-12 DIAGNOSIS — Z992 Dependence on renal dialysis: Secondary | ICD-10-CM | POA: Diagnosis not present

## 2022-08-12 DIAGNOSIS — N2581 Secondary hyperparathyroidism of renal origin: Secondary | ICD-10-CM | POA: Diagnosis not present

## 2022-08-12 DIAGNOSIS — N186 End stage renal disease: Secondary | ICD-10-CM | POA: Diagnosis not present

## 2022-08-14 DIAGNOSIS — N186 End stage renal disease: Secondary | ICD-10-CM | POA: Diagnosis not present

## 2022-08-14 DIAGNOSIS — N2581 Secondary hyperparathyroidism of renal origin: Secondary | ICD-10-CM | POA: Diagnosis not present

## 2022-08-14 DIAGNOSIS — Z992 Dependence on renal dialysis: Secondary | ICD-10-CM | POA: Diagnosis not present

## 2022-08-16 DIAGNOSIS — Z992 Dependence on renal dialysis: Secondary | ICD-10-CM | POA: Diagnosis not present

## 2022-08-16 DIAGNOSIS — N186 End stage renal disease: Secondary | ICD-10-CM | POA: Diagnosis not present

## 2022-08-16 DIAGNOSIS — N2581 Secondary hyperparathyroidism of renal origin: Secondary | ICD-10-CM | POA: Diagnosis not present

## 2022-08-20 DIAGNOSIS — Z992 Dependence on renal dialysis: Secondary | ICD-10-CM | POA: Diagnosis not present

## 2022-08-20 DIAGNOSIS — N2581 Secondary hyperparathyroidism of renal origin: Secondary | ICD-10-CM | POA: Diagnosis not present

## 2022-08-20 DIAGNOSIS — N186 End stage renal disease: Secondary | ICD-10-CM | POA: Diagnosis not present

## 2022-08-23 DIAGNOSIS — N186 End stage renal disease: Secondary | ICD-10-CM | POA: Diagnosis not present

## 2022-08-23 DIAGNOSIS — Z992 Dependence on renal dialysis: Secondary | ICD-10-CM | POA: Diagnosis not present

## 2022-08-23 DIAGNOSIS — N2581 Secondary hyperparathyroidism of renal origin: Secondary | ICD-10-CM | POA: Diagnosis not present

## 2022-08-26 DIAGNOSIS — N2581 Secondary hyperparathyroidism of renal origin: Secondary | ICD-10-CM | POA: Diagnosis not present

## 2022-08-26 DIAGNOSIS — Z992 Dependence on renal dialysis: Secondary | ICD-10-CM | POA: Diagnosis not present

## 2022-08-26 DIAGNOSIS — N186 End stage renal disease: Secondary | ICD-10-CM | POA: Diagnosis not present

## 2022-08-29 DIAGNOSIS — R079 Chest pain, unspecified: Secondary | ICD-10-CM | POA: Diagnosis not present

## 2022-08-29 DIAGNOSIS — Z992 Dependence on renal dialysis: Secondary | ICD-10-CM | POA: Diagnosis not present

## 2022-08-29 DIAGNOSIS — R42 Dizziness and giddiness: Secondary | ICD-10-CM | POA: Diagnosis not present

## 2022-08-29 DIAGNOSIS — I120 Hypertensive chronic kidney disease with stage 5 chronic kidney disease or end stage renal disease: Secondary | ICD-10-CM | POA: Diagnosis not present

## 2022-08-29 DIAGNOSIS — N186 End stage renal disease: Secondary | ICD-10-CM | POA: Diagnosis not present

## 2022-08-30 DIAGNOSIS — N186 End stage renal disease: Secondary | ICD-10-CM | POA: Diagnosis not present

## 2022-08-30 DIAGNOSIS — Z992 Dependence on renal dialysis: Secondary | ICD-10-CM | POA: Diagnosis not present

## 2022-08-30 DIAGNOSIS — N2581 Secondary hyperparathyroidism of renal origin: Secondary | ICD-10-CM | POA: Diagnosis not present

## 2022-09-02 DIAGNOSIS — N2581 Secondary hyperparathyroidism of renal origin: Secondary | ICD-10-CM | POA: Diagnosis not present

## 2022-09-02 DIAGNOSIS — N186 End stage renal disease: Secondary | ICD-10-CM | POA: Diagnosis not present

## 2022-09-02 DIAGNOSIS — Z992 Dependence on renal dialysis: Secondary | ICD-10-CM | POA: Diagnosis not present

## 2022-09-04 DIAGNOSIS — N2581 Secondary hyperparathyroidism of renal origin: Secondary | ICD-10-CM | POA: Diagnosis not present

## 2022-09-04 DIAGNOSIS — N186 End stage renal disease: Secondary | ICD-10-CM | POA: Diagnosis not present

## 2022-09-04 DIAGNOSIS — Z992 Dependence on renal dialysis: Secondary | ICD-10-CM | POA: Diagnosis not present

## 2022-09-06 DIAGNOSIS — N186 End stage renal disease: Secondary | ICD-10-CM | POA: Diagnosis not present

## 2022-09-06 DIAGNOSIS — N2581 Secondary hyperparathyroidism of renal origin: Secondary | ICD-10-CM | POA: Diagnosis not present

## 2022-09-06 DIAGNOSIS — Z992 Dependence on renal dialysis: Secondary | ICD-10-CM | POA: Diagnosis not present

## 2022-09-09 DIAGNOSIS — N2581 Secondary hyperparathyroidism of renal origin: Secondary | ICD-10-CM | POA: Diagnosis not present

## 2022-09-09 DIAGNOSIS — Z992 Dependence on renal dialysis: Secondary | ICD-10-CM | POA: Diagnosis not present

## 2022-09-09 DIAGNOSIS — N186 End stage renal disease: Secondary | ICD-10-CM | POA: Diagnosis not present

## 2022-09-11 DIAGNOSIS — N2581 Secondary hyperparathyroidism of renal origin: Secondary | ICD-10-CM | POA: Diagnosis not present

## 2022-09-11 DIAGNOSIS — N186 End stage renal disease: Secondary | ICD-10-CM | POA: Diagnosis not present

## 2022-09-11 DIAGNOSIS — Z992 Dependence on renal dialysis: Secondary | ICD-10-CM | POA: Diagnosis not present

## 2022-09-13 DIAGNOSIS — N186 End stage renal disease: Secondary | ICD-10-CM | POA: Diagnosis not present

## 2022-09-13 DIAGNOSIS — N2581 Secondary hyperparathyroidism of renal origin: Secondary | ICD-10-CM | POA: Diagnosis not present

## 2022-09-13 DIAGNOSIS — Z992 Dependence on renal dialysis: Secondary | ICD-10-CM | POA: Diagnosis not present

## 2022-09-16 DIAGNOSIS — Z992 Dependence on renal dialysis: Secondary | ICD-10-CM | POA: Diagnosis not present

## 2022-09-16 DIAGNOSIS — N186 End stage renal disease: Secondary | ICD-10-CM | POA: Diagnosis not present

## 2022-09-16 DIAGNOSIS — N2581 Secondary hyperparathyroidism of renal origin: Secondary | ICD-10-CM | POA: Diagnosis not present

## 2022-09-19 DIAGNOSIS — H0014 Chalazion left upper eyelid: Secondary | ICD-10-CM | POA: Diagnosis not present

## 2022-09-20 DIAGNOSIS — N2581 Secondary hyperparathyroidism of renal origin: Secondary | ICD-10-CM | POA: Diagnosis not present

## 2022-09-20 DIAGNOSIS — Z992 Dependence on renal dialysis: Secondary | ICD-10-CM | POA: Diagnosis not present

## 2022-09-20 DIAGNOSIS — N186 End stage renal disease: Secondary | ICD-10-CM | POA: Diagnosis not present

## 2022-09-22 DIAGNOSIS — N2581 Secondary hyperparathyroidism of renal origin: Secondary | ICD-10-CM | POA: Diagnosis not present

## 2022-09-22 DIAGNOSIS — N186 End stage renal disease: Secondary | ICD-10-CM | POA: Diagnosis not present

## 2022-09-22 DIAGNOSIS — Z992 Dependence on renal dialysis: Secondary | ICD-10-CM | POA: Diagnosis not present

## 2022-09-25 DIAGNOSIS — N186 End stage renal disease: Secondary | ICD-10-CM | POA: Diagnosis not present

## 2022-09-25 DIAGNOSIS — N2581 Secondary hyperparathyroidism of renal origin: Secondary | ICD-10-CM | POA: Diagnosis not present

## 2022-09-25 DIAGNOSIS — Z992 Dependence on renal dialysis: Secondary | ICD-10-CM | POA: Diagnosis not present

## 2022-09-27 DIAGNOSIS — Z992 Dependence on renal dialysis: Secondary | ICD-10-CM | POA: Diagnosis not present

## 2022-09-27 DIAGNOSIS — N2581 Secondary hyperparathyroidism of renal origin: Secondary | ICD-10-CM | POA: Diagnosis not present

## 2022-09-27 DIAGNOSIS — N186 End stage renal disease: Secondary | ICD-10-CM | POA: Diagnosis not present

## 2022-09-29 DIAGNOSIS — I120 Hypertensive chronic kidney disease with stage 5 chronic kidney disease or end stage renal disease: Secondary | ICD-10-CM | POA: Diagnosis not present

## 2022-09-29 DIAGNOSIS — Z992 Dependence on renal dialysis: Secondary | ICD-10-CM | POA: Diagnosis not present

## 2022-09-29 DIAGNOSIS — N186 End stage renal disease: Secondary | ICD-10-CM | POA: Diagnosis not present

## 2022-10-02 DIAGNOSIS — N186 End stage renal disease: Secondary | ICD-10-CM | POA: Diagnosis not present

## 2022-10-02 DIAGNOSIS — N2581 Secondary hyperparathyroidism of renal origin: Secondary | ICD-10-CM | POA: Diagnosis not present

## 2022-10-02 DIAGNOSIS — Z992 Dependence on renal dialysis: Secondary | ICD-10-CM | POA: Diagnosis not present

## 2022-10-04 DIAGNOSIS — N186 End stage renal disease: Secondary | ICD-10-CM | POA: Diagnosis not present

## 2022-10-04 DIAGNOSIS — Z992 Dependence on renal dialysis: Secondary | ICD-10-CM | POA: Diagnosis not present

## 2022-10-04 DIAGNOSIS — N2581 Secondary hyperparathyroidism of renal origin: Secondary | ICD-10-CM | POA: Diagnosis not present

## 2022-10-07 DIAGNOSIS — N186 End stage renal disease: Secondary | ICD-10-CM | POA: Diagnosis not present

## 2022-10-07 DIAGNOSIS — N2581 Secondary hyperparathyroidism of renal origin: Secondary | ICD-10-CM | POA: Diagnosis not present

## 2022-10-07 DIAGNOSIS — Z992 Dependence on renal dialysis: Secondary | ICD-10-CM | POA: Diagnosis not present

## 2022-10-09 DIAGNOSIS — N2581 Secondary hyperparathyroidism of renal origin: Secondary | ICD-10-CM | POA: Diagnosis not present

## 2022-10-09 DIAGNOSIS — N186 End stage renal disease: Secondary | ICD-10-CM | POA: Diagnosis not present

## 2022-10-09 DIAGNOSIS — Z992 Dependence on renal dialysis: Secondary | ICD-10-CM | POA: Diagnosis not present

## 2022-10-11 DIAGNOSIS — Z992 Dependence on renal dialysis: Secondary | ICD-10-CM | POA: Diagnosis not present

## 2022-10-11 DIAGNOSIS — N2581 Secondary hyperparathyroidism of renal origin: Secondary | ICD-10-CM | POA: Diagnosis not present

## 2022-10-11 DIAGNOSIS — N186 End stage renal disease: Secondary | ICD-10-CM | POA: Diagnosis not present

## 2022-10-14 DIAGNOSIS — N186 End stage renal disease: Secondary | ICD-10-CM | POA: Diagnosis not present

## 2022-10-14 DIAGNOSIS — Z992 Dependence on renal dialysis: Secondary | ICD-10-CM | POA: Diagnosis not present

## 2022-10-14 DIAGNOSIS — N2581 Secondary hyperparathyroidism of renal origin: Secondary | ICD-10-CM | POA: Diagnosis not present

## 2022-10-18 DIAGNOSIS — Z992 Dependence on renal dialysis: Secondary | ICD-10-CM | POA: Diagnosis not present

## 2022-10-18 DIAGNOSIS — N2581 Secondary hyperparathyroidism of renal origin: Secondary | ICD-10-CM | POA: Diagnosis not present

## 2022-10-18 DIAGNOSIS — N186 End stage renal disease: Secondary | ICD-10-CM | POA: Diagnosis not present

## 2022-10-21 DIAGNOSIS — N186 End stage renal disease: Secondary | ICD-10-CM | POA: Diagnosis not present

## 2022-10-21 DIAGNOSIS — N2581 Secondary hyperparathyroidism of renal origin: Secondary | ICD-10-CM | POA: Diagnosis not present

## 2022-10-21 DIAGNOSIS — Z992 Dependence on renal dialysis: Secondary | ICD-10-CM | POA: Diagnosis not present

## 2022-10-23 DIAGNOSIS — Z992 Dependence on renal dialysis: Secondary | ICD-10-CM | POA: Diagnosis not present

## 2022-10-23 DIAGNOSIS — N186 End stage renal disease: Secondary | ICD-10-CM | POA: Diagnosis not present

## 2022-10-23 DIAGNOSIS — N2581 Secondary hyperparathyroidism of renal origin: Secondary | ICD-10-CM | POA: Diagnosis not present

## 2022-10-25 DIAGNOSIS — Z992 Dependence on renal dialysis: Secondary | ICD-10-CM | POA: Diagnosis not present

## 2022-10-25 DIAGNOSIS — N2581 Secondary hyperparathyroidism of renal origin: Secondary | ICD-10-CM | POA: Diagnosis not present

## 2022-10-25 DIAGNOSIS — N186 End stage renal disease: Secondary | ICD-10-CM | POA: Diagnosis not present

## 2022-10-28 DIAGNOSIS — N186 End stage renal disease: Secondary | ICD-10-CM | POA: Diagnosis not present

## 2022-10-28 DIAGNOSIS — N2581 Secondary hyperparathyroidism of renal origin: Secondary | ICD-10-CM | POA: Diagnosis not present

## 2022-10-28 DIAGNOSIS — Z992 Dependence on renal dialysis: Secondary | ICD-10-CM | POA: Diagnosis not present

## 2022-10-30 DIAGNOSIS — N186 End stage renal disease: Secondary | ICD-10-CM | POA: Diagnosis not present

## 2022-10-30 DIAGNOSIS — I120 Hypertensive chronic kidney disease with stage 5 chronic kidney disease or end stage renal disease: Secondary | ICD-10-CM | POA: Diagnosis not present

## 2022-10-30 DIAGNOSIS — Z992 Dependence on renal dialysis: Secondary | ICD-10-CM | POA: Diagnosis not present

## 2022-11-01 DIAGNOSIS — N2581 Secondary hyperparathyroidism of renal origin: Secondary | ICD-10-CM | POA: Diagnosis not present

## 2022-11-01 DIAGNOSIS — N186 End stage renal disease: Secondary | ICD-10-CM | POA: Diagnosis not present

## 2022-11-01 DIAGNOSIS — Z992 Dependence on renal dialysis: Secondary | ICD-10-CM | POA: Diagnosis not present

## 2022-11-04 DIAGNOSIS — Z992 Dependence on renal dialysis: Secondary | ICD-10-CM | POA: Diagnosis not present

## 2022-11-04 DIAGNOSIS — N186 End stage renal disease: Secondary | ICD-10-CM | POA: Diagnosis not present

## 2022-11-04 DIAGNOSIS — N2581 Secondary hyperparathyroidism of renal origin: Secondary | ICD-10-CM | POA: Diagnosis not present

## 2022-11-08 DIAGNOSIS — Z992 Dependence on renal dialysis: Secondary | ICD-10-CM | POA: Diagnosis not present

## 2022-11-08 DIAGNOSIS — N2581 Secondary hyperparathyroidism of renal origin: Secondary | ICD-10-CM | POA: Diagnosis not present

## 2022-11-08 DIAGNOSIS — N186 End stage renal disease: Secondary | ICD-10-CM | POA: Diagnosis not present

## 2022-11-11 DIAGNOSIS — N186 End stage renal disease: Secondary | ICD-10-CM | POA: Diagnosis not present

## 2022-11-11 DIAGNOSIS — N2581 Secondary hyperparathyroidism of renal origin: Secondary | ICD-10-CM | POA: Diagnosis not present

## 2022-11-11 DIAGNOSIS — Z992 Dependence on renal dialysis: Secondary | ICD-10-CM | POA: Diagnosis not present

## 2022-11-13 DIAGNOSIS — N2581 Secondary hyperparathyroidism of renal origin: Secondary | ICD-10-CM | POA: Diagnosis not present

## 2022-11-13 DIAGNOSIS — Z992 Dependence on renal dialysis: Secondary | ICD-10-CM | POA: Diagnosis not present

## 2022-11-13 DIAGNOSIS — N186 End stage renal disease: Secondary | ICD-10-CM | POA: Diagnosis not present

## 2022-11-15 DIAGNOSIS — N186 End stage renal disease: Secondary | ICD-10-CM | POA: Diagnosis not present

## 2022-11-15 DIAGNOSIS — Z992 Dependence on renal dialysis: Secondary | ICD-10-CM | POA: Diagnosis not present

## 2022-11-15 DIAGNOSIS — N2581 Secondary hyperparathyroidism of renal origin: Secondary | ICD-10-CM | POA: Diagnosis not present

## 2022-11-18 DIAGNOSIS — N2581 Secondary hyperparathyroidism of renal origin: Secondary | ICD-10-CM | POA: Diagnosis not present

## 2022-11-18 DIAGNOSIS — Z992 Dependence on renal dialysis: Secondary | ICD-10-CM | POA: Diagnosis not present

## 2022-11-18 DIAGNOSIS — N186 End stage renal disease: Secondary | ICD-10-CM | POA: Diagnosis not present

## 2022-11-20 DIAGNOSIS — N2581 Secondary hyperparathyroidism of renal origin: Secondary | ICD-10-CM | POA: Diagnosis not present

## 2022-11-20 DIAGNOSIS — Z992 Dependence on renal dialysis: Secondary | ICD-10-CM | POA: Diagnosis not present

## 2022-11-20 DIAGNOSIS — N186 End stage renal disease: Secondary | ICD-10-CM | POA: Diagnosis not present

## 2022-11-21 IMAGING — US US EXTREM  UP VENOUS*L*
1 series · 13 of 24 positions shown · non-contrast
Comparison: None.

CLINICAL DATA: Soft tissue swelling. Surgically created
arteriovenous fistula in the brachiocephalic region.

EXAM:
LEFT UPPER EXTREMITY VENOUS DUPLEX ULTRASOUND
TECHNIQUE: Gray-scale sonography with graded compression, as well as color
Doppler and duplex ultrasound were performed to evaluate the left
upper extremity deep venous system from the level of the subclavian
vein and including the jugular, axillary, basilic, radial, ulnar and
upper cephalic vein. Spectral Doppler was utilized to evaluate flow
at rest and with distal augmentation maneuvers.

[Series 1: us extrem up venous*left* · 0.08mm/px · 13 of 45 slices shown]
[im 1/45]
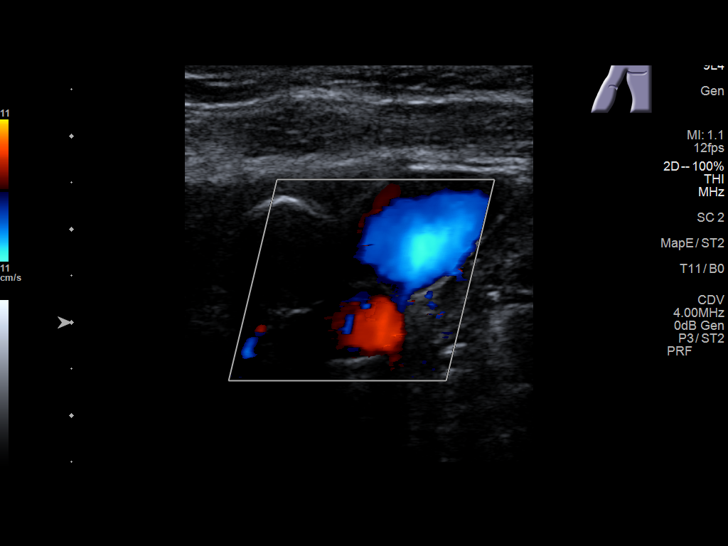
[im 4/45]
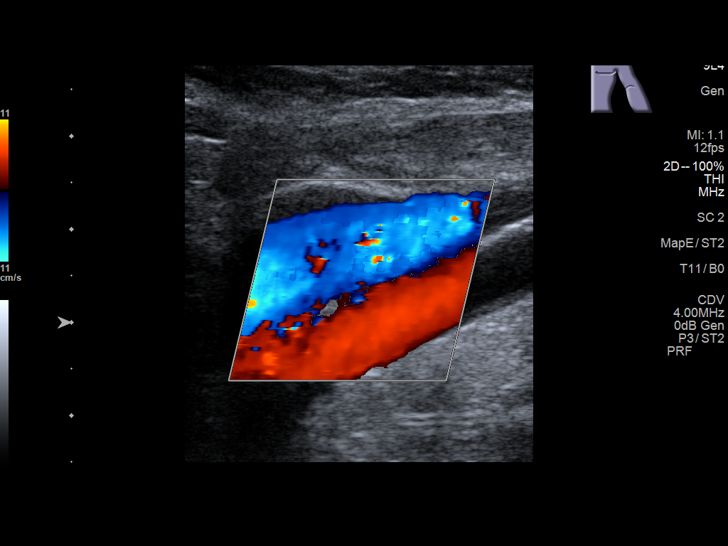
[im 8/45]
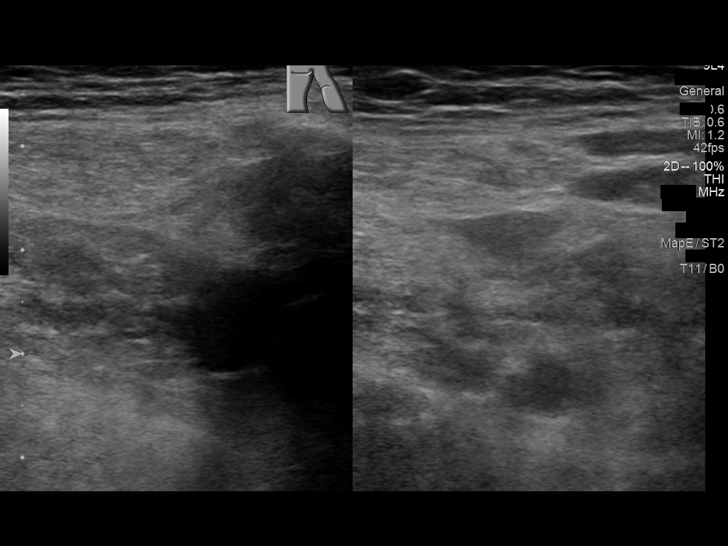
[im 12/45]
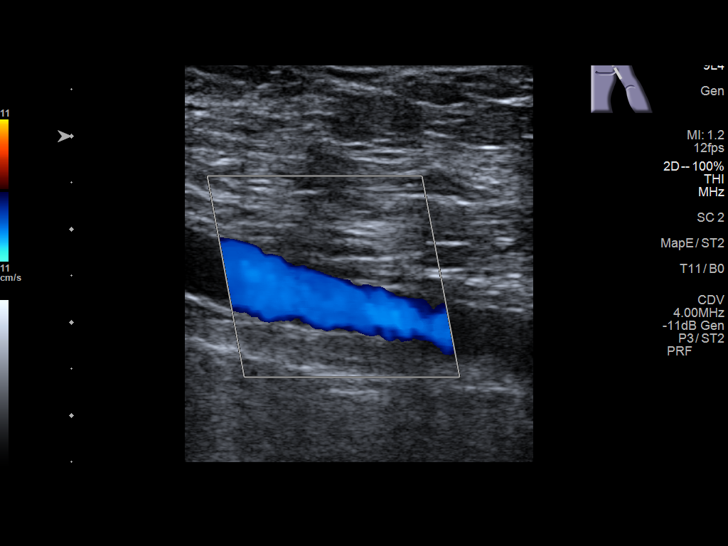
[im 16/45]
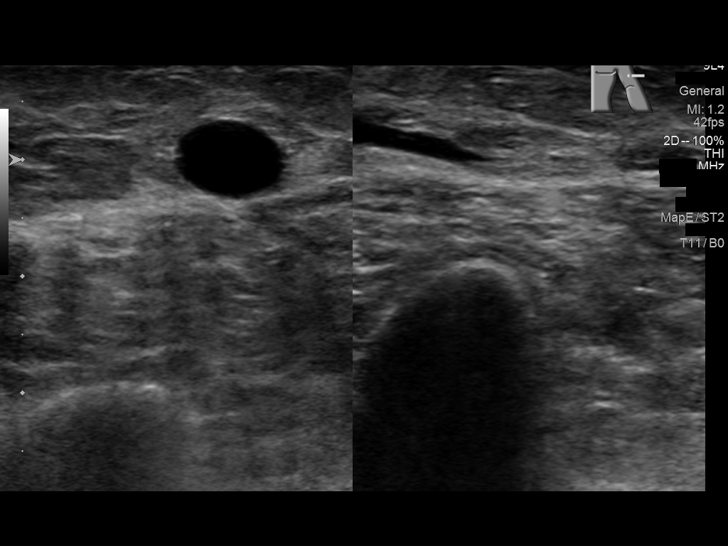
[im 20/45]
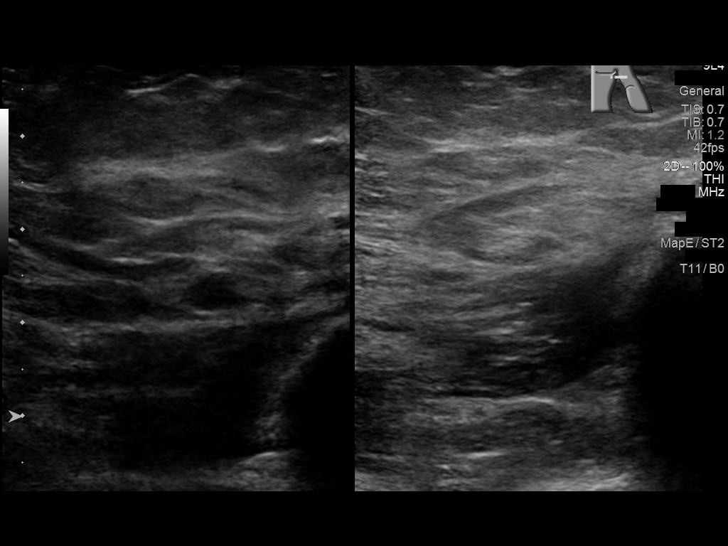
[im 23/45]
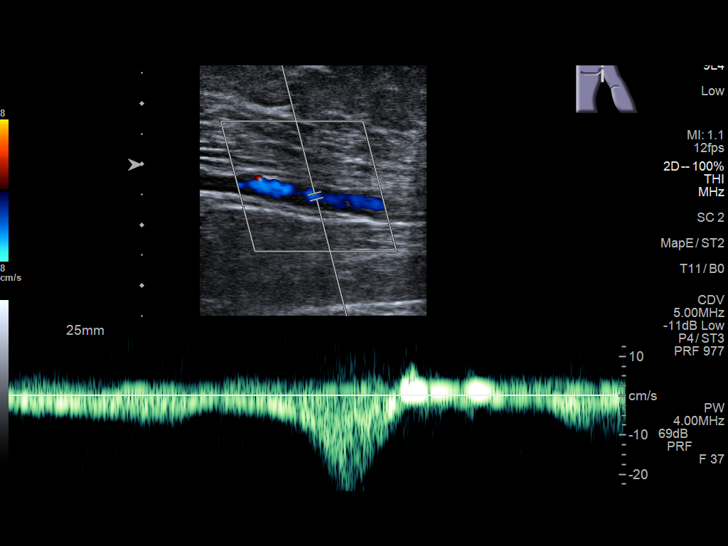
[im 25/45]
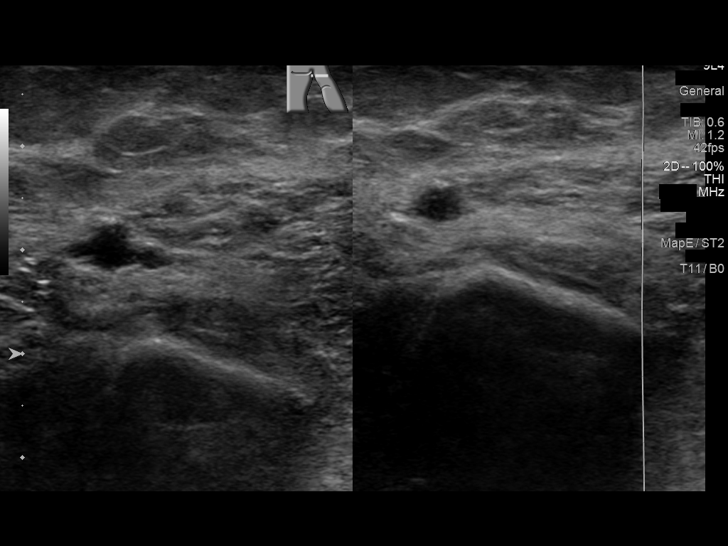
[im 29/45]
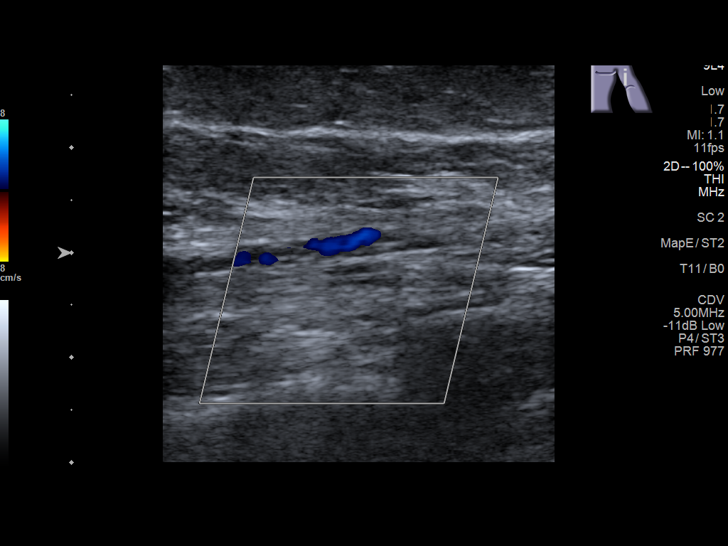
[im 33/45]
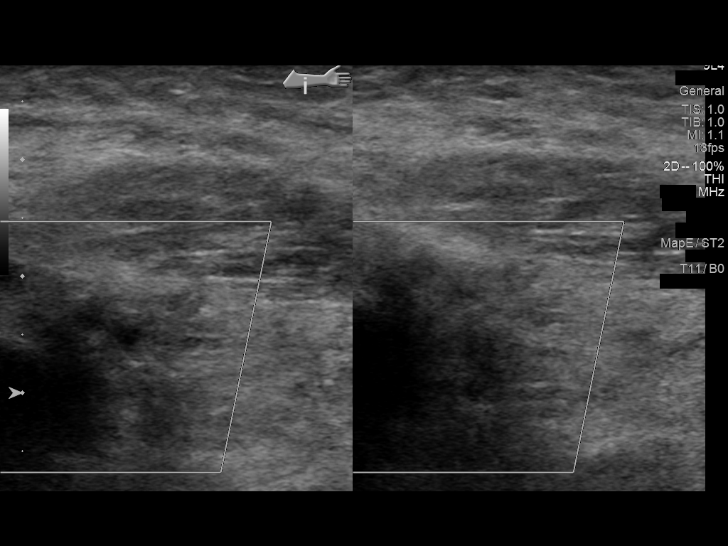
[im 37/45]
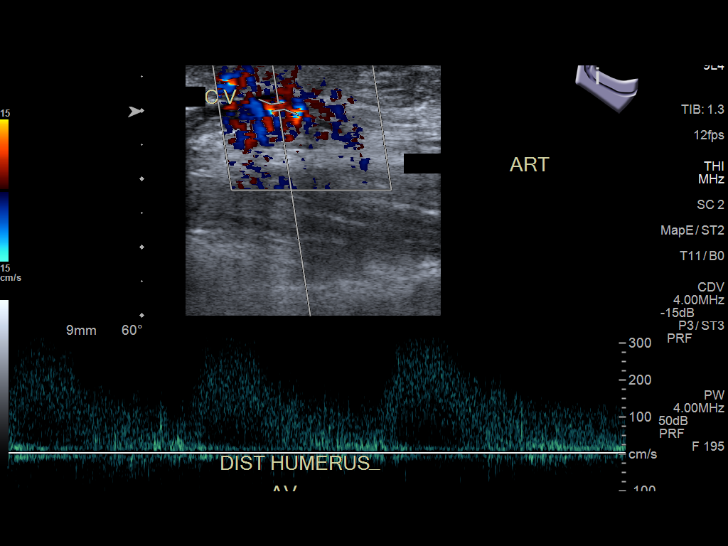
[im 41/45]
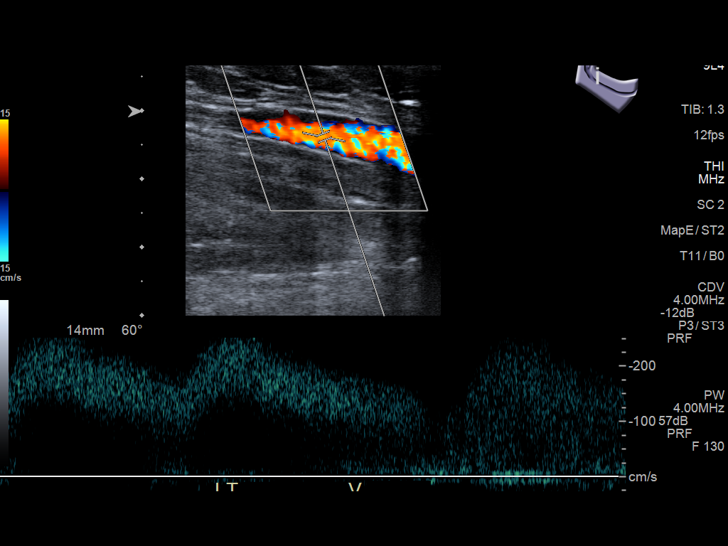
[im 45/45]
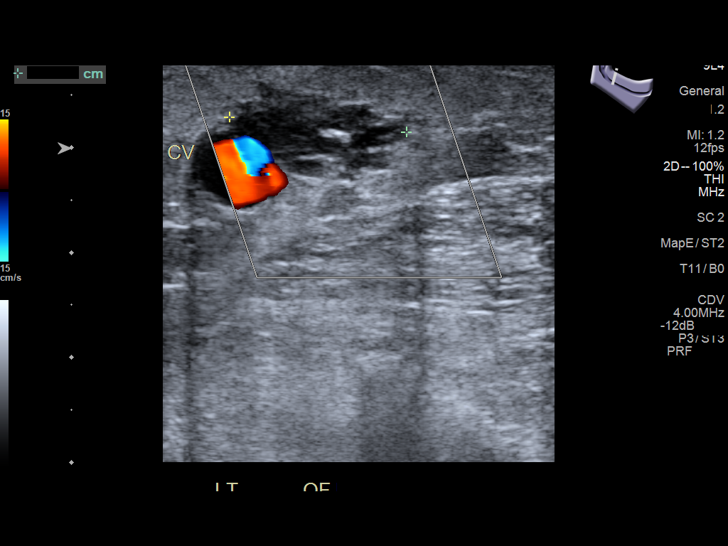

[13 of 24 positions shown; findings below may reference images not displayed]

FINDINGS: Contralateral Subclavian Vein: Respiratory phasicity is normal and
symmetric with the symptomatic side. No evidence of thrombus. Normal
compressibility.

Internal Jugular Vein: No evidence of thrombus. Normal
compressibility, respiratory phasicity and response to augmentation.

Subclavian Vein: No evidence of thrombus. Normal compressibility,
respiratory phasicity and response to augmentation.

Axillary Vein: No evidence of thrombus. Normal compressibility,
respiratory phasicity and response to augmentation.

Cephalic Vein: No evidence of thrombus. Normal compressibility,
respiratory phasicity and response to augmentation.

Basilic Vein: No evidence of thrombus. Normal compressibility,
respiratory phasicity and response to augmentation.

Brachial Veins: No evidence of thrombus. Normal compressibility,
respiratory phasicity and response to augmentation.

Radial Veins: No evidence of thrombus. Normal compressibility,
respiratory phasicity and response to augmentation.

Ulnar Veins: No evidence of thrombus. Normal compressibility,
respiratory phasicity and response to augmentation.

Venous Reflux:  None visualized.

Other Findings: There is a surgically created brachiocephalic region
arteriovenous fistula which is patent. Adjacent to the lower
cephalic vein, there is a mildly complex avascular structure
containing septation and internal echoes measuring 2.2 x 0.8 x
cm. No similar lesions of this nature elsewhere.
IMPRESSION: 1. No evident deep venous thrombosis in the left upper extremity.
Right common femoral vein also patent.

2.  Patent brachiocephalic arteriovenous fistula on the left.

3. Complex predominantly cystic avascular structure near the left
cephalic vein measuring 2.2 x 0.8 x 1.7 cm. Suspect partially
liquified hematoma as most likely etiology.

## 2022-11-22 DIAGNOSIS — Z992 Dependence on renal dialysis: Secondary | ICD-10-CM | POA: Diagnosis not present

## 2022-11-22 DIAGNOSIS — N186 End stage renal disease: Secondary | ICD-10-CM | POA: Diagnosis not present

## 2022-11-22 DIAGNOSIS — N2581 Secondary hyperparathyroidism of renal origin: Secondary | ICD-10-CM | POA: Diagnosis not present

## 2022-11-25 DIAGNOSIS — Z992 Dependence on renal dialysis: Secondary | ICD-10-CM | POA: Diagnosis not present

## 2022-11-25 DIAGNOSIS — N186 End stage renal disease: Secondary | ICD-10-CM | POA: Diagnosis not present

## 2022-11-25 DIAGNOSIS — N2581 Secondary hyperparathyroidism of renal origin: Secondary | ICD-10-CM | POA: Diagnosis not present

## 2022-11-27 DIAGNOSIS — N186 End stage renal disease: Secondary | ICD-10-CM | POA: Diagnosis not present

## 2022-11-27 DIAGNOSIS — Z992 Dependence on renal dialysis: Secondary | ICD-10-CM | POA: Diagnosis not present

## 2022-11-27 DIAGNOSIS — N2581 Secondary hyperparathyroidism of renal origin: Secondary | ICD-10-CM | POA: Diagnosis not present

## 2022-11-28 DIAGNOSIS — Z992 Dependence on renal dialysis: Secondary | ICD-10-CM | POA: Diagnosis not present

## 2022-11-28 DIAGNOSIS — I120 Hypertensive chronic kidney disease with stage 5 chronic kidney disease or end stage renal disease: Secondary | ICD-10-CM | POA: Diagnosis not present

## 2022-11-28 DIAGNOSIS — N186 End stage renal disease: Secondary | ICD-10-CM | POA: Diagnosis not present

## 2022-12-02 DIAGNOSIS — N2581 Secondary hyperparathyroidism of renal origin: Secondary | ICD-10-CM | POA: Diagnosis not present

## 2022-12-02 DIAGNOSIS — N186 End stage renal disease: Secondary | ICD-10-CM | POA: Diagnosis not present

## 2022-12-02 DIAGNOSIS — Z992 Dependence on renal dialysis: Secondary | ICD-10-CM | POA: Diagnosis not present

## 2022-12-04 DIAGNOSIS — N186 End stage renal disease: Secondary | ICD-10-CM | POA: Diagnosis not present

## 2022-12-04 DIAGNOSIS — N2581 Secondary hyperparathyroidism of renal origin: Secondary | ICD-10-CM | POA: Diagnosis not present

## 2022-12-04 DIAGNOSIS — Z992 Dependence on renal dialysis: Secondary | ICD-10-CM | POA: Diagnosis not present

## 2022-12-09 DIAGNOSIS — Z992 Dependence on renal dialysis: Secondary | ICD-10-CM | POA: Diagnosis not present

## 2022-12-09 DIAGNOSIS — N186 End stage renal disease: Secondary | ICD-10-CM | POA: Diagnosis not present

## 2022-12-09 DIAGNOSIS — N2581 Secondary hyperparathyroidism of renal origin: Secondary | ICD-10-CM | POA: Diagnosis not present

## 2022-12-10 DIAGNOSIS — K219 Gastro-esophageal reflux disease without esophagitis: Secondary | ICD-10-CM | POA: Diagnosis not present

## 2022-12-10 DIAGNOSIS — R195 Other fecal abnormalities: Secondary | ICD-10-CM | POA: Diagnosis not present

## 2022-12-11 DIAGNOSIS — N2581 Secondary hyperparathyroidism of renal origin: Secondary | ICD-10-CM | POA: Diagnosis not present

## 2022-12-11 DIAGNOSIS — Z992 Dependence on renal dialysis: Secondary | ICD-10-CM | POA: Diagnosis not present

## 2022-12-11 DIAGNOSIS — N186 End stage renal disease: Secondary | ICD-10-CM | POA: Diagnosis not present

## 2022-12-16 DIAGNOSIS — Z992 Dependence on renal dialysis: Secondary | ICD-10-CM | POA: Diagnosis not present

## 2022-12-16 DIAGNOSIS — N2581 Secondary hyperparathyroidism of renal origin: Secondary | ICD-10-CM | POA: Diagnosis not present

## 2022-12-16 DIAGNOSIS — N186 End stage renal disease: Secondary | ICD-10-CM | POA: Diagnosis not present

## 2022-12-18 DIAGNOSIS — N186 End stage renal disease: Secondary | ICD-10-CM | POA: Diagnosis not present

## 2022-12-18 DIAGNOSIS — N2581 Secondary hyperparathyroidism of renal origin: Secondary | ICD-10-CM | POA: Diagnosis not present

## 2022-12-18 DIAGNOSIS — Z992 Dependence on renal dialysis: Secondary | ICD-10-CM | POA: Diagnosis not present

## 2022-12-23 DIAGNOSIS — N186 End stage renal disease: Secondary | ICD-10-CM | POA: Diagnosis not present

## 2022-12-23 DIAGNOSIS — N2581 Secondary hyperparathyroidism of renal origin: Secondary | ICD-10-CM | POA: Diagnosis not present

## 2022-12-23 DIAGNOSIS — Z992 Dependence on renal dialysis: Secondary | ICD-10-CM | POA: Diagnosis not present

## 2022-12-27 DIAGNOSIS — N186 End stage renal disease: Secondary | ICD-10-CM | POA: Diagnosis not present

## 2022-12-27 DIAGNOSIS — Z992 Dependence on renal dialysis: Secondary | ICD-10-CM | POA: Diagnosis not present

## 2022-12-27 DIAGNOSIS — N2581 Secondary hyperparathyroidism of renal origin: Secondary | ICD-10-CM | POA: Diagnosis not present

## 2022-12-29 DIAGNOSIS — I120 Hypertensive chronic kidney disease with stage 5 chronic kidney disease or end stage renal disease: Secondary | ICD-10-CM | POA: Diagnosis not present

## 2022-12-29 DIAGNOSIS — Z992 Dependence on renal dialysis: Secondary | ICD-10-CM | POA: Diagnosis not present

## 2022-12-29 DIAGNOSIS — N186 End stage renal disease: Secondary | ICD-10-CM | POA: Diagnosis not present

## 2022-12-30 DIAGNOSIS — N2581 Secondary hyperparathyroidism of renal origin: Secondary | ICD-10-CM | POA: Diagnosis not present

## 2022-12-30 DIAGNOSIS — Z992 Dependence on renal dialysis: Secondary | ICD-10-CM | POA: Diagnosis not present

## 2022-12-30 DIAGNOSIS — N186 End stage renal disease: Secondary | ICD-10-CM | POA: Diagnosis not present

## 2023-01-01 DIAGNOSIS — N186 End stage renal disease: Secondary | ICD-10-CM | POA: Diagnosis not present

## 2023-01-01 DIAGNOSIS — N2581 Secondary hyperparathyroidism of renal origin: Secondary | ICD-10-CM | POA: Diagnosis not present

## 2023-01-01 DIAGNOSIS — Z992 Dependence on renal dialysis: Secondary | ICD-10-CM | POA: Diagnosis not present

## 2023-01-03 DIAGNOSIS — Z992 Dependence on renal dialysis: Secondary | ICD-10-CM | POA: Diagnosis not present

## 2023-01-03 DIAGNOSIS — N2581 Secondary hyperparathyroidism of renal origin: Secondary | ICD-10-CM | POA: Diagnosis not present

## 2023-01-03 DIAGNOSIS — N186 End stage renal disease: Secondary | ICD-10-CM | POA: Diagnosis not present

## 2023-01-13 DIAGNOSIS — N2581 Secondary hyperparathyroidism of renal origin: Secondary | ICD-10-CM | POA: Diagnosis not present

## 2023-01-13 DIAGNOSIS — Z992 Dependence on renal dialysis: Secondary | ICD-10-CM | POA: Diagnosis not present

## 2023-01-13 DIAGNOSIS — N186 End stage renal disease: Secondary | ICD-10-CM | POA: Diagnosis not present

## 2023-01-15 DIAGNOSIS — N186 End stage renal disease: Secondary | ICD-10-CM | POA: Diagnosis not present

## 2023-01-15 DIAGNOSIS — Z992 Dependence on renal dialysis: Secondary | ICD-10-CM | POA: Diagnosis not present

## 2023-01-15 DIAGNOSIS — N2581 Secondary hyperparathyroidism of renal origin: Secondary | ICD-10-CM | POA: Diagnosis not present

## 2023-01-17 DIAGNOSIS — N2581 Secondary hyperparathyroidism of renal origin: Secondary | ICD-10-CM | POA: Diagnosis not present

## 2023-01-17 DIAGNOSIS — Z992 Dependence on renal dialysis: Secondary | ICD-10-CM | POA: Diagnosis not present

## 2023-01-17 DIAGNOSIS — N186 End stage renal disease: Secondary | ICD-10-CM | POA: Diagnosis not present

## 2023-01-22 DIAGNOSIS — Z992 Dependence on renal dialysis: Secondary | ICD-10-CM | POA: Diagnosis not present

## 2023-01-22 DIAGNOSIS — N2581 Secondary hyperparathyroidism of renal origin: Secondary | ICD-10-CM | POA: Diagnosis not present

## 2023-01-22 DIAGNOSIS — N186 End stage renal disease: Secondary | ICD-10-CM | POA: Diagnosis not present

## 2023-01-24 DIAGNOSIS — N2581 Secondary hyperparathyroidism of renal origin: Secondary | ICD-10-CM | POA: Diagnosis not present

## 2023-01-24 DIAGNOSIS — N186 End stage renal disease: Secondary | ICD-10-CM | POA: Diagnosis not present

## 2023-01-24 DIAGNOSIS — Z992 Dependence on renal dialysis: Secondary | ICD-10-CM | POA: Diagnosis not present

## 2023-01-27 DIAGNOSIS — N186 End stage renal disease: Secondary | ICD-10-CM | POA: Diagnosis not present

## 2023-01-27 DIAGNOSIS — N2581 Secondary hyperparathyroidism of renal origin: Secondary | ICD-10-CM | POA: Diagnosis not present

## 2023-01-27 DIAGNOSIS — Z992 Dependence on renal dialysis: Secondary | ICD-10-CM | POA: Diagnosis not present

## 2023-01-28 DIAGNOSIS — S322XXA Fracture of coccyx, initial encounter for closed fracture: Secondary | ICD-10-CM | POA: Diagnosis not present

## 2023-01-28 DIAGNOSIS — M545 Low back pain, unspecified: Secondary | ICD-10-CM | POA: Diagnosis not present

## 2023-01-28 DIAGNOSIS — N186 End stage renal disease: Secondary | ICD-10-CM | POA: Diagnosis not present

## 2023-01-28 DIAGNOSIS — Z992 Dependence on renal dialysis: Secondary | ICD-10-CM | POA: Diagnosis not present

## 2023-01-28 DIAGNOSIS — M549 Dorsalgia, unspecified: Secondary | ICD-10-CM | POA: Diagnosis not present

## 2023-01-28 DIAGNOSIS — I120 Hypertensive chronic kidney disease with stage 5 chronic kidney disease or end stage renal disease: Secondary | ICD-10-CM | POA: Diagnosis not present

## 2023-01-28 DIAGNOSIS — M546 Pain in thoracic spine: Secondary | ICD-10-CM | POA: Diagnosis not present

## 2023-01-28 DIAGNOSIS — M542 Cervicalgia: Secondary | ICD-10-CM | POA: Diagnosis not present

## 2023-01-28 DIAGNOSIS — M533 Sacrococcygeal disorders, not elsewhere classified: Secondary | ICD-10-CM | POA: Diagnosis not present

## 2023-01-28 DIAGNOSIS — G8929 Other chronic pain: Secondary | ICD-10-CM | POA: Diagnosis not present

## 2023-01-30 ENCOUNTER — Encounter: Admission: RE | Payer: Self-pay | Source: Home / Self Care

## 2023-01-30 ENCOUNTER — Ambulatory Visit: Admission: RE | Admit: 2023-01-30 | Payer: Medicare HMO | Source: Home / Self Care | Admitting: Gastroenterology

## 2023-01-30 SURGERY — COLONOSCOPY
Anesthesia: General

## 2023-01-31 DIAGNOSIS — N186 End stage renal disease: Secondary | ICD-10-CM | POA: Diagnosis not present

## 2023-01-31 DIAGNOSIS — Z992 Dependence on renal dialysis: Secondary | ICD-10-CM | POA: Diagnosis not present

## 2023-01-31 DIAGNOSIS — N2581 Secondary hyperparathyroidism of renal origin: Secondary | ICD-10-CM | POA: Diagnosis not present

## 2023-02-03 DIAGNOSIS — Z992 Dependence on renal dialysis: Secondary | ICD-10-CM | POA: Diagnosis not present

## 2023-02-03 DIAGNOSIS — N186 End stage renal disease: Secondary | ICD-10-CM | POA: Diagnosis not present

## 2023-02-03 DIAGNOSIS — N2581 Secondary hyperparathyroidism of renal origin: Secondary | ICD-10-CM | POA: Diagnosis not present

## 2023-02-05 DIAGNOSIS — I10 Essential (primary) hypertension: Secondary | ICD-10-CM | POA: Diagnosis not present

## 2023-02-05 DIAGNOSIS — N2581 Secondary hyperparathyroidism of renal origin: Secondary | ICD-10-CM | POA: Diagnosis not present

## 2023-02-05 DIAGNOSIS — Z6841 Body Mass Index (BMI) 40.0 and over, adult: Secondary | ICD-10-CM | POA: Diagnosis not present

## 2023-02-05 DIAGNOSIS — N186 End stage renal disease: Secondary | ICD-10-CM | POA: Diagnosis not present

## 2023-02-06 DIAGNOSIS — M542 Cervicalgia: Secondary | ICD-10-CM | POA: Diagnosis not present

## 2023-02-07 DIAGNOSIS — N2581 Secondary hyperparathyroidism of renal origin: Secondary | ICD-10-CM | POA: Diagnosis not present

## 2023-02-07 DIAGNOSIS — N186 End stage renal disease: Secondary | ICD-10-CM | POA: Diagnosis not present

## 2023-02-07 DIAGNOSIS — Z992 Dependence on renal dialysis: Secondary | ICD-10-CM | POA: Diagnosis not present

## 2023-02-10 DIAGNOSIS — N186 End stage renal disease: Secondary | ICD-10-CM | POA: Diagnosis not present

## 2023-02-10 DIAGNOSIS — N2581 Secondary hyperparathyroidism of renal origin: Secondary | ICD-10-CM | POA: Diagnosis not present

## 2023-02-10 DIAGNOSIS — Z992 Dependence on renal dialysis: Secondary | ICD-10-CM | POA: Diagnosis not present

## 2023-02-11 DIAGNOSIS — I10 Essential (primary) hypertension: Secondary | ICD-10-CM | POA: Diagnosis not present

## 2023-02-11 DIAGNOSIS — N186 End stage renal disease: Secondary | ICD-10-CM | POA: Diagnosis not present

## 2023-02-11 DIAGNOSIS — N2581 Secondary hyperparathyroidism of renal origin: Secondary | ICD-10-CM | POA: Diagnosis not present

## 2023-02-11 DIAGNOSIS — Z6841 Body Mass Index (BMI) 40.0 and over, adult: Secondary | ICD-10-CM | POA: Diagnosis not present

## 2023-02-12 DIAGNOSIS — N186 End stage renal disease: Secondary | ICD-10-CM | POA: Diagnosis not present

## 2023-02-12 DIAGNOSIS — Z992 Dependence on renal dialysis: Secondary | ICD-10-CM | POA: Diagnosis not present

## 2023-02-12 DIAGNOSIS — N2581 Secondary hyperparathyroidism of renal origin: Secondary | ICD-10-CM | POA: Diagnosis not present

## 2023-02-14 DIAGNOSIS — N186 End stage renal disease: Secondary | ICD-10-CM | POA: Diagnosis not present

## 2023-02-14 DIAGNOSIS — N2581 Secondary hyperparathyroidism of renal origin: Secondary | ICD-10-CM | POA: Diagnosis not present

## 2023-02-14 DIAGNOSIS — Z992 Dependence on renal dialysis: Secondary | ICD-10-CM | POA: Diagnosis not present

## 2023-02-17 DIAGNOSIS — N186 End stage renal disease: Secondary | ICD-10-CM | POA: Diagnosis not present

## 2023-02-17 DIAGNOSIS — N2581 Secondary hyperparathyroidism of renal origin: Secondary | ICD-10-CM | POA: Diagnosis not present

## 2023-02-17 DIAGNOSIS — Z992 Dependence on renal dialysis: Secondary | ICD-10-CM | POA: Diagnosis not present

## 2023-02-19 DIAGNOSIS — Z992 Dependence on renal dialysis: Secondary | ICD-10-CM | POA: Diagnosis not present

## 2023-02-19 DIAGNOSIS — R519 Headache, unspecified: Secondary | ICD-10-CM | POA: Diagnosis not present

## 2023-02-19 DIAGNOSIS — J811 Chronic pulmonary edema: Secondary | ICD-10-CM | POA: Diagnosis not present

## 2023-02-19 DIAGNOSIS — M549 Dorsalgia, unspecified: Secondary | ICD-10-CM | POA: Diagnosis not present

## 2023-02-19 DIAGNOSIS — R0789 Other chest pain: Secondary | ICD-10-CM | POA: Diagnosis not present

## 2023-02-19 DIAGNOSIS — N186 End stage renal disease: Secondary | ICD-10-CM | POA: Diagnosis not present

## 2023-02-21 DIAGNOSIS — Z992 Dependence on renal dialysis: Secondary | ICD-10-CM | POA: Diagnosis not present

## 2023-02-21 DIAGNOSIS — N2581 Secondary hyperparathyroidism of renal origin: Secondary | ICD-10-CM | POA: Diagnosis not present

## 2023-02-21 DIAGNOSIS — N186 End stage renal disease: Secondary | ICD-10-CM | POA: Diagnosis not present

## 2023-02-24 DIAGNOSIS — N186 End stage renal disease: Secondary | ICD-10-CM | POA: Diagnosis not present

## 2023-02-24 DIAGNOSIS — Z992 Dependence on renal dialysis: Secondary | ICD-10-CM | POA: Diagnosis not present

## 2023-02-24 DIAGNOSIS — N2581 Secondary hyperparathyroidism of renal origin: Secondary | ICD-10-CM | POA: Diagnosis not present

## 2023-02-27 DIAGNOSIS — M542 Cervicalgia: Secondary | ICD-10-CM | POA: Diagnosis not present

## 2023-02-28 DIAGNOSIS — Z992 Dependence on renal dialysis: Secondary | ICD-10-CM | POA: Diagnosis not present

## 2023-02-28 DIAGNOSIS — I120 Hypertensive chronic kidney disease with stage 5 chronic kidney disease or end stage renal disease: Secondary | ICD-10-CM | POA: Diagnosis not present

## 2023-02-28 DIAGNOSIS — N186 End stage renal disease: Secondary | ICD-10-CM | POA: Diagnosis not present

## 2023-02-28 DIAGNOSIS — N2581 Secondary hyperparathyroidism of renal origin: Secondary | ICD-10-CM | POA: Diagnosis not present

## 2023-03-03 DIAGNOSIS — B9689 Other specified bacterial agents as the cause of diseases classified elsewhere: Secondary | ICD-10-CM | POA: Diagnosis not present

## 2023-03-03 DIAGNOSIS — J019 Acute sinusitis, unspecified: Secondary | ICD-10-CM | POA: Diagnosis not present

## 2023-03-03 DIAGNOSIS — Z992 Dependence on renal dialysis: Secondary | ICD-10-CM | POA: Diagnosis not present

## 2023-03-03 DIAGNOSIS — N2581 Secondary hyperparathyroidism of renal origin: Secondary | ICD-10-CM | POA: Diagnosis not present

## 2023-03-03 DIAGNOSIS — N186 End stage renal disease: Secondary | ICD-10-CM | POA: Diagnosis not present

## 2023-03-03 DIAGNOSIS — L739 Follicular disorder, unspecified: Secondary | ICD-10-CM | POA: Diagnosis not present

## 2023-03-03 DIAGNOSIS — H66001 Acute suppurative otitis media without spontaneous rupture of ear drum, right ear: Secondary | ICD-10-CM | POA: Diagnosis not present

## 2023-03-04 DIAGNOSIS — G8929 Other chronic pain: Secondary | ICD-10-CM | POA: Diagnosis not present

## 2023-03-04 DIAGNOSIS — R0789 Other chest pain: Secondary | ICD-10-CM | POA: Diagnosis not present

## 2023-03-04 DIAGNOSIS — I6523 Occlusion and stenosis of bilateral carotid arteries: Secondary | ICD-10-CM | POA: Diagnosis not present

## 2023-03-04 DIAGNOSIS — M546 Pain in thoracic spine: Secondary | ICD-10-CM | POA: Diagnosis not present

## 2023-03-05 DIAGNOSIS — N2581 Secondary hyperparathyroidism of renal origin: Secondary | ICD-10-CM | POA: Diagnosis not present

## 2023-03-05 DIAGNOSIS — N186 End stage renal disease: Secondary | ICD-10-CM | POA: Diagnosis not present

## 2023-03-05 DIAGNOSIS — Z992 Dependence on renal dialysis: Secondary | ICD-10-CM | POA: Diagnosis not present

## 2023-03-07 DIAGNOSIS — N186 End stage renal disease: Secondary | ICD-10-CM | POA: Diagnosis not present

## 2023-03-07 DIAGNOSIS — Z992 Dependence on renal dialysis: Secondary | ICD-10-CM | POA: Diagnosis not present

## 2023-03-07 DIAGNOSIS — N2581 Secondary hyperparathyroidism of renal origin: Secondary | ICD-10-CM | POA: Diagnosis not present

## 2023-03-10 DIAGNOSIS — N2581 Secondary hyperparathyroidism of renal origin: Secondary | ICD-10-CM | POA: Diagnosis not present

## 2023-03-10 DIAGNOSIS — N186 End stage renal disease: Secondary | ICD-10-CM | POA: Diagnosis not present

## 2023-03-10 DIAGNOSIS — Z992 Dependence on renal dialysis: Secondary | ICD-10-CM | POA: Diagnosis not present

## 2023-03-12 DIAGNOSIS — N186 End stage renal disease: Secondary | ICD-10-CM | POA: Diagnosis not present

## 2023-03-12 DIAGNOSIS — Z992 Dependence on renal dialysis: Secondary | ICD-10-CM | POA: Diagnosis not present

## 2023-03-12 DIAGNOSIS — N2581 Secondary hyperparathyroidism of renal origin: Secondary | ICD-10-CM | POA: Diagnosis not present

## 2023-03-17 DIAGNOSIS — Z992 Dependence on renal dialysis: Secondary | ICD-10-CM | POA: Diagnosis not present

## 2023-03-17 DIAGNOSIS — N186 End stage renal disease: Secondary | ICD-10-CM | POA: Diagnosis not present

## 2023-03-17 DIAGNOSIS — N2581 Secondary hyperparathyroidism of renal origin: Secondary | ICD-10-CM | POA: Diagnosis not present

## 2023-03-19 DIAGNOSIS — N2581 Secondary hyperparathyroidism of renal origin: Secondary | ICD-10-CM | POA: Diagnosis not present

## 2023-03-19 DIAGNOSIS — Z992 Dependence on renal dialysis: Secondary | ICD-10-CM | POA: Diagnosis not present

## 2023-03-19 DIAGNOSIS — N186 End stage renal disease: Secondary | ICD-10-CM | POA: Diagnosis not present

## 2023-03-21 DIAGNOSIS — N2581 Secondary hyperparathyroidism of renal origin: Secondary | ICD-10-CM | POA: Diagnosis not present

## 2023-03-21 DIAGNOSIS — Z992 Dependence on renal dialysis: Secondary | ICD-10-CM | POA: Diagnosis not present

## 2023-03-21 DIAGNOSIS — N186 End stage renal disease: Secondary | ICD-10-CM | POA: Diagnosis not present

## 2023-03-24 DIAGNOSIS — N2581 Secondary hyperparathyroidism of renal origin: Secondary | ICD-10-CM | POA: Diagnosis not present

## 2023-03-24 DIAGNOSIS — N186 End stage renal disease: Secondary | ICD-10-CM | POA: Diagnosis not present

## 2023-03-24 DIAGNOSIS — Z992 Dependence on renal dialysis: Secondary | ICD-10-CM | POA: Diagnosis not present

## 2023-03-25 DIAGNOSIS — I6523 Occlusion and stenosis of bilateral carotid arteries: Secondary | ICD-10-CM | POA: Diagnosis not present

## 2023-03-25 DIAGNOSIS — R0602 Shortness of breath: Secondary | ICD-10-CM | POA: Diagnosis not present

## 2023-03-25 DIAGNOSIS — I10 Essential (primary) hypertension: Secondary | ICD-10-CM | POA: Diagnosis not present

## 2023-03-25 DIAGNOSIS — E782 Mixed hyperlipidemia: Secondary | ICD-10-CM | POA: Diagnosis not present

## 2023-03-25 DIAGNOSIS — I4510 Unspecified right bundle-branch block: Secondary | ICD-10-CM | POA: Diagnosis not present

## 2023-03-25 DIAGNOSIS — I340 Nonrheumatic mitral (valve) insufficiency: Secondary | ICD-10-CM | POA: Diagnosis not present

## 2023-03-26 DIAGNOSIS — Z992 Dependence on renal dialysis: Secondary | ICD-10-CM | POA: Diagnosis not present

## 2023-03-26 DIAGNOSIS — N2581 Secondary hyperparathyroidism of renal origin: Secondary | ICD-10-CM | POA: Diagnosis not present

## 2023-03-26 DIAGNOSIS — N186 End stage renal disease: Secondary | ICD-10-CM | POA: Diagnosis not present

## 2023-03-27 IMAGING — CR DG CHEST 2V
1 series · 2 of 2 positions shown · non-contrast
Comparison: Chest radiographs 06/27/2020 and earlier.

CLINICAL DATA: 79-year-old female with chest and epigastric pain
for 2 weeks.

EXAM:
CHEST - 2 VIEW

[Series 1: dg chest 2 view · 0.14mm/px · 2 of 2 slices shown]
[im 1/2]
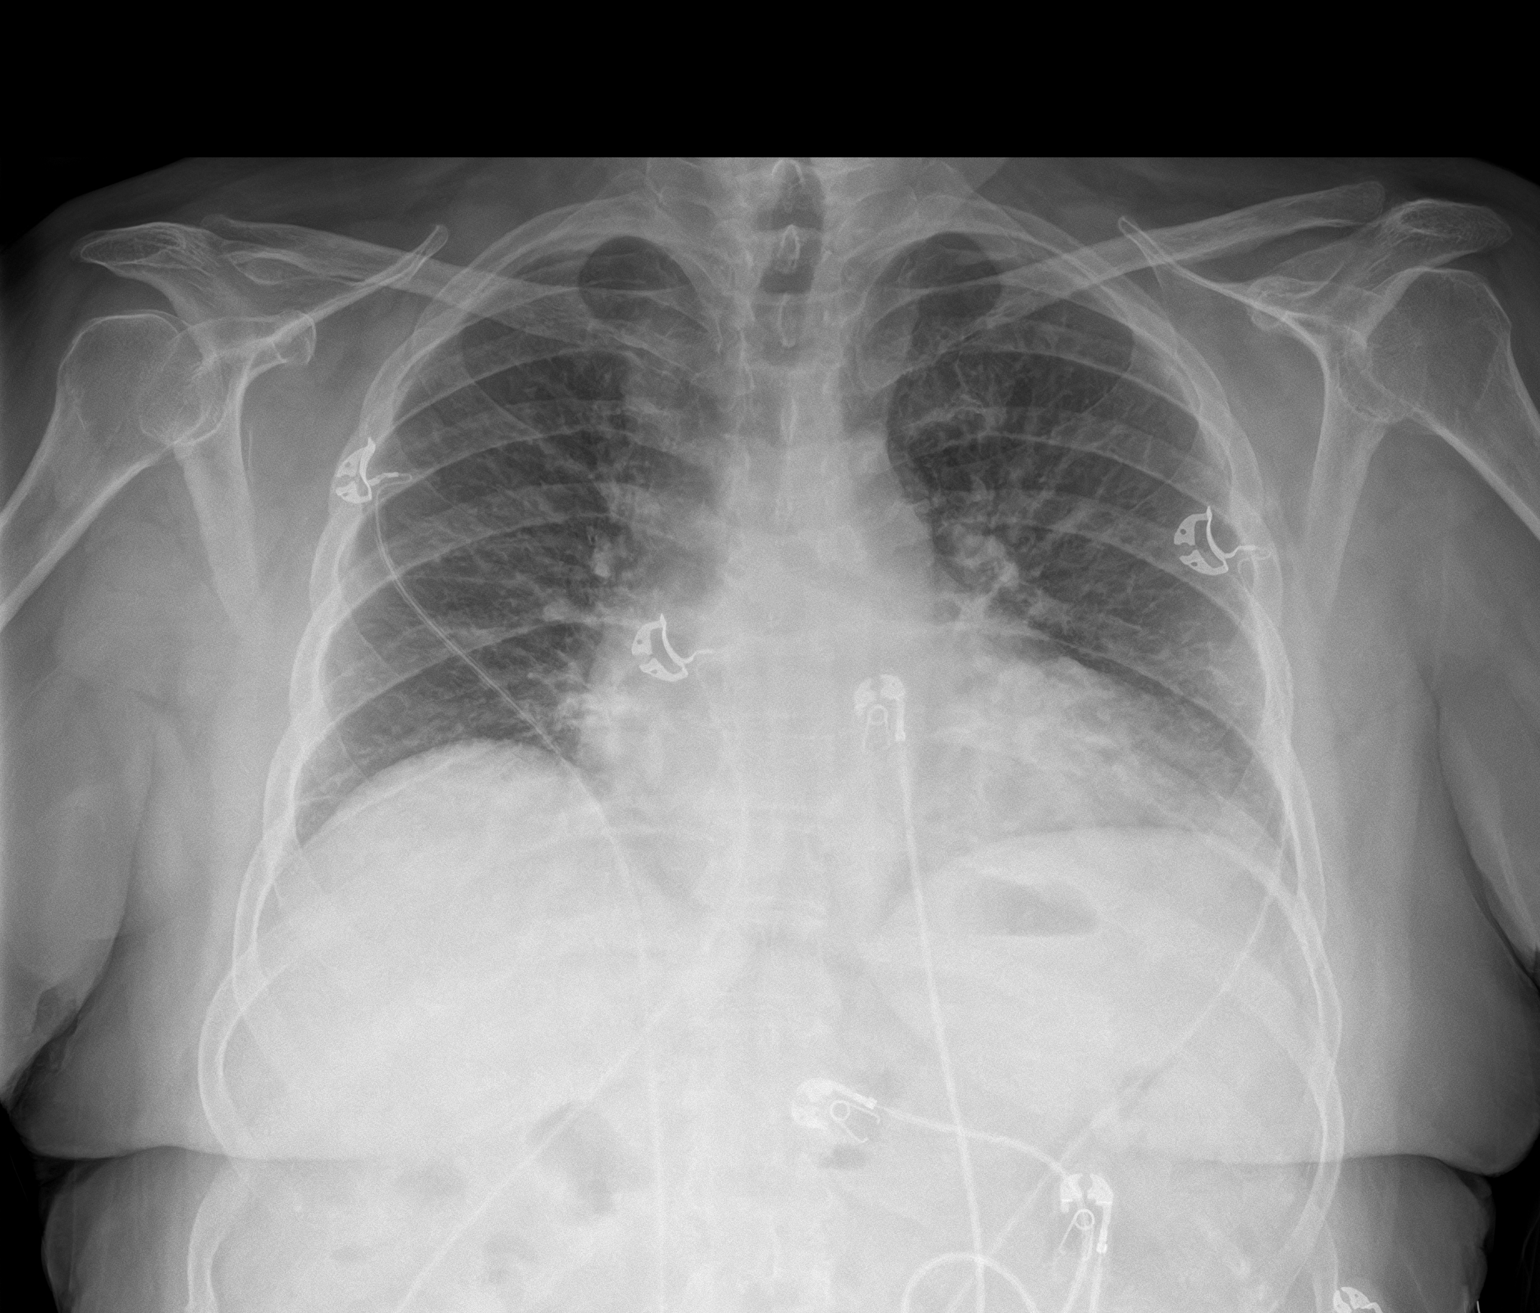
[im 2/2]
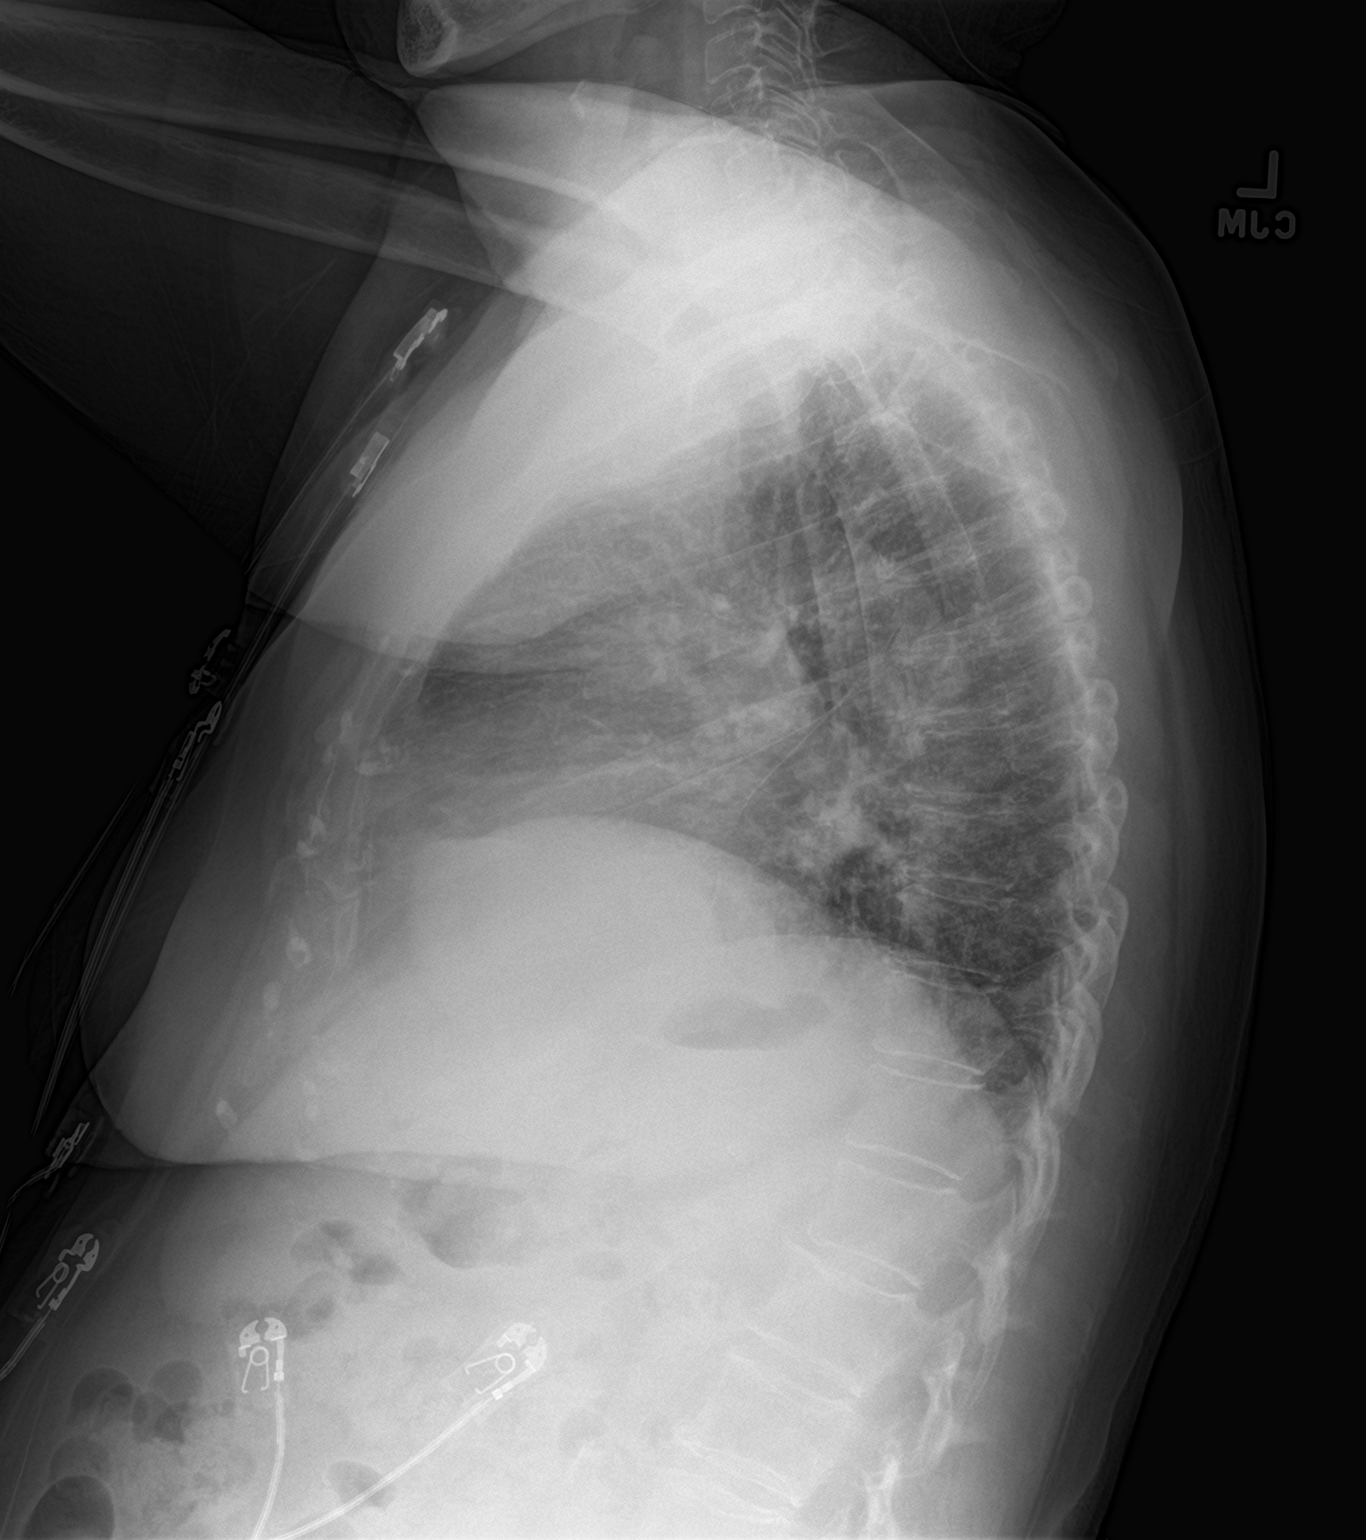

[2 of 2 positions shown; findings below may reference images not displayed]

FINDINGS: Right chest dialysis catheter removed since last year. Lower lung
volumes on both views. Stable borderline to mild cardiomegaly. Other
mediastinal contours are within normal limits. Visualized tracheal
air column is within normal limits. No pneumothorax, pleural
effusion or consolidation. Indistinct pulmonary vasculature compared
to last year.

No acute osseous abnormality identified. Negative visible bowel gas
pattern.
IMPRESSION: Lower lung volumes with pulmonary vasculature congestion but no
overt edema.

## 2023-03-28 DIAGNOSIS — N2581 Secondary hyperparathyroidism of renal origin: Secondary | ICD-10-CM | POA: Diagnosis not present

## 2023-03-28 DIAGNOSIS — N186 End stage renal disease: Secondary | ICD-10-CM | POA: Diagnosis not present

## 2023-03-28 DIAGNOSIS — Z992 Dependence on renal dialysis: Secondary | ICD-10-CM | POA: Diagnosis not present

## 2023-03-30 DIAGNOSIS — I120 Hypertensive chronic kidney disease with stage 5 chronic kidney disease or end stage renal disease: Secondary | ICD-10-CM | POA: Diagnosis not present

## 2023-03-30 DIAGNOSIS — N186 End stage renal disease: Secondary | ICD-10-CM | POA: Diagnosis not present

## 2023-03-30 DIAGNOSIS — Z992 Dependence on renal dialysis: Secondary | ICD-10-CM | POA: Diagnosis not present

## 2023-03-31 DIAGNOSIS — N186 End stage renal disease: Secondary | ICD-10-CM | POA: Diagnosis not present

## 2023-03-31 DIAGNOSIS — Z992 Dependence on renal dialysis: Secondary | ICD-10-CM | POA: Diagnosis not present

## 2023-03-31 DIAGNOSIS — N2581 Secondary hyperparathyroidism of renal origin: Secondary | ICD-10-CM | POA: Diagnosis not present

## 2023-04-02 DIAGNOSIS — Z992 Dependence on renal dialysis: Secondary | ICD-10-CM | POA: Diagnosis not present

## 2023-04-02 DIAGNOSIS — N186 End stage renal disease: Secondary | ICD-10-CM | POA: Diagnosis not present

## 2023-04-02 DIAGNOSIS — N2581 Secondary hyperparathyroidism of renal origin: Secondary | ICD-10-CM | POA: Diagnosis not present

## 2023-04-04 DIAGNOSIS — Z992 Dependence on renal dialysis: Secondary | ICD-10-CM | POA: Diagnosis not present

## 2023-04-04 DIAGNOSIS — N2581 Secondary hyperparathyroidism of renal origin: Secondary | ICD-10-CM | POA: Diagnosis not present

## 2023-04-04 DIAGNOSIS — N186 End stage renal disease: Secondary | ICD-10-CM | POA: Diagnosis not present

## 2023-04-07 DIAGNOSIS — Z992 Dependence on renal dialysis: Secondary | ICD-10-CM | POA: Diagnosis not present

## 2023-04-07 DIAGNOSIS — N186 End stage renal disease: Secondary | ICD-10-CM | POA: Diagnosis not present

## 2023-04-07 DIAGNOSIS — N2581 Secondary hyperparathyroidism of renal origin: Secondary | ICD-10-CM | POA: Diagnosis not present

## 2023-04-09 DIAGNOSIS — Z992 Dependence on renal dialysis: Secondary | ICD-10-CM | POA: Diagnosis not present

## 2023-04-09 DIAGNOSIS — N2581 Secondary hyperparathyroidism of renal origin: Secondary | ICD-10-CM | POA: Diagnosis not present

## 2023-04-09 DIAGNOSIS — N186 End stage renal disease: Secondary | ICD-10-CM | POA: Diagnosis not present

## 2023-04-14 DIAGNOSIS — Z992 Dependence on renal dialysis: Secondary | ICD-10-CM | POA: Diagnosis not present

## 2023-04-14 DIAGNOSIS — N186 End stage renal disease: Secondary | ICD-10-CM | POA: Diagnosis not present

## 2023-04-14 DIAGNOSIS — N2581 Secondary hyperparathyroidism of renal origin: Secondary | ICD-10-CM | POA: Diagnosis not present

## 2023-04-16 DIAGNOSIS — N186 End stage renal disease: Secondary | ICD-10-CM | POA: Diagnosis not present

## 2023-04-16 DIAGNOSIS — N2581 Secondary hyperparathyroidism of renal origin: Secondary | ICD-10-CM | POA: Diagnosis not present

## 2023-04-16 DIAGNOSIS — Z992 Dependence on renal dialysis: Secondary | ICD-10-CM | POA: Diagnosis not present

## 2023-04-21 DIAGNOSIS — N186 End stage renal disease: Secondary | ICD-10-CM | POA: Diagnosis not present

## 2023-04-21 DIAGNOSIS — Z992 Dependence on renal dialysis: Secondary | ICD-10-CM | POA: Diagnosis not present

## 2023-04-21 DIAGNOSIS — N2581 Secondary hyperparathyroidism of renal origin: Secondary | ICD-10-CM | POA: Diagnosis not present

## 2023-04-23 DIAGNOSIS — N2581 Secondary hyperparathyroidism of renal origin: Secondary | ICD-10-CM | POA: Diagnosis not present

## 2023-04-23 DIAGNOSIS — N186 End stage renal disease: Secondary | ICD-10-CM | POA: Diagnosis not present

## 2023-04-23 DIAGNOSIS — Z992 Dependence on renal dialysis: Secondary | ICD-10-CM | POA: Diagnosis not present

## 2023-04-24 ENCOUNTER — Telehealth (INDEPENDENT_AMBULATORY_CARE_PROVIDER_SITE_OTHER): Payer: Self-pay

## 2023-04-24 NOTE — Telephone Encounter (Signed)
I attempted to contact the patient through [employer] and the line was busy with no way to leave a message. We then attempted to contact through the daughter and the line was no working either.

## 2023-04-25 DIAGNOSIS — L03213 Periorbital cellulitis: Secondary | ICD-10-CM | POA: Diagnosis not present

## 2023-04-25 DIAGNOSIS — H00025 Hordeolum internum left lower eyelid: Secondary | ICD-10-CM | POA: Diagnosis not present

## 2023-04-28 ENCOUNTER — Ambulatory Visit
Admission: RE | Admit: 2023-04-28 | Discharge: 2023-04-28 | Disposition: A | Discharge status: Home / Self Care | Payer: Medicare HMO | Source: Ambulatory Visit

## 2023-04-28 ENCOUNTER — Ambulatory Visit (INDEPENDENT_AMBULATORY_CARE_PROVIDER_SITE_OTHER): Payer: Medicare HMO

## 2023-04-28 VITALS — BP 161/74 | HR 79 | Temp 98.3°F | Resp 16 | Ht <= 58 in | Wt 144.0 lb

## 2023-04-28 DIAGNOSIS — R1010 Upper abdominal pain, unspecified: Secondary | ICD-10-CM

## 2023-04-28 DIAGNOSIS — R1031 Right lower quadrant pain: Secondary | ICD-10-CM

## 2023-04-28 DIAGNOSIS — R102 Pelvic and perineal pain: Secondary | ICD-10-CM | POA: Diagnosis not present

## 2023-04-28 DIAGNOSIS — K5901 Slow transit constipation: Secondary | ICD-10-CM | POA: Diagnosis not present

## 2023-04-28 DIAGNOSIS — K5900 Constipation, unspecified: Secondary | ICD-10-CM | POA: Diagnosis not present

## 2023-04-28 MED ORDER — HYDROCORTISONE (PERIANAL) 2.5 % EX CREA: 1.0000 | TOPICAL_CREAM | Freq: Two times a day (BID) | CUTANEOUS | 0 refills | Status: DC

## 2023-04-28 NOTE — Discharge Instructions (Addendum)
Compre Magnesium Citrate y tome toda la botella para limpiar su intestino. Si eso no funciona, comprese una enema  Si ambos no le ayudan y su dolor persiste, vaya a emergencias

## 2023-04-28 NOTE — ED Triage Notes (Signed)
Pt c/o lower abd pain & nausea x1 wk. States she urge to urinate but unable to void. Denies any N/V/D. LBM:04/26/23. No otc tx.

## 2023-04-28 NOTE — ED Provider Notes (Signed)
[facility]    CSN: [id number] Arrival date & time: 04/28/23  1725      History   Chief Complaint Chief Complaint  Patient presents with   Abdominal Pain    Appt    HPI Leah Davies is a 82 y.o. female who presents with lower abdominal pain and nausea x 1 week. She also has the urge to have a BM but she can't. Feels her external hemorrhoid is causing her lower abdominal  when she sits. She Denies V/D. Her last BM was 2 days ago. Her abdomen feels bloated. Denies dysuria    Past Medical History:  Diagnosis Date   Arthritis    Chronic kidney disease    renal insufficiency   Complication of anesthesia    Vital signs low during colonoscopy had trouble to wake up    Elevated lipids    GERD (gastroesophageal reflux disease)    Glaucoma    Gout    HLD (hyperlipidemia)    Hypertension     Patient Active Problem List   Diagnosis Date Noted   Chest pain 04/10/2021   Obesity (BMI 30-39.9) 04/10/2021   Complication from renal dialysis device 09/13/2020   Constipation    ESRD (end stage renal disease) (HCC) 04/10/2020   Nonintractable headache    Metabolic acidosis 04/09/2020   Hypertensive urgency 04/09/2020   AKI (acute kidney injury) (HCC) 04/09/2020   Acute renal failure superimposed on stage 5 chronic kidney disease, not on chronic dialysis (HCC) 02/23/2020   Uncontrolled hypertension 02/23/2020   Intractable vomiting with nausea 02/19/2020   Hyperkalemia 02/19/2020   Acute lower UTI 02/19/2020   Essential hypertension 02/19/2020   GERD (gastroesophageal reflux disease) 02/19/2020   Anemia of chronic disease 02/19/2020   Hypocalcemia 02/19/2020   Varicose veins of both lower extremities with inflammation 11/18/2016   Pain in limb 11/18/2016   Chronic venous insufficiency 11/18/2016   Swelling of limb 11/18/2016   Abdominal bloating 06/26/2016   Perioral dermatitis 06/21/2015   Other dorsalgia 04/06/2015   Age-related osteoporosis without current  pathological fracture 03/23/2015   Vertigo 12/21/2013   Sebaceous cyst 12/01/2013   Secondary hyperparathyroidism of renal origin (HCC) 07/27/2012   Vitamin D deficiency 05/19/2012   Chronic kidney disease, stage IV (severe) (HCC) 01/22/2012   Gout 10/23/2011   Diverticulosis of intestine, part unspecified, without perforation or abscess without bleeding 12/03/2010   Hemorrhoids 01/04/2010   Disequilibrium 07/04/2009   Breast lump 02/09/2009   Dermatitis 06/14/2008   Allergic rhinitis 03/08/2008   Hyperlipidemia 03/08/2008   Personal history of other diseases of the digestive system 03/08/2008   Gastro-esophageal reflux disease without esophagitis 03/08/2008    Past Surgical History:  Procedure Laterality Date   A/V FISTULAGRAM Left 08/22/2020   Procedure: A/V FISTULAGRAM;  Surgeon: Renford Dills, MD;  Location: [facility];  Service: Cardiovascular;  Laterality: Left;   A/V FISTULAGRAM Left 01/02/2021   Procedure: A/V FISTULAGRAM;  Surgeon: Renford Dills, MD;  Location: [facility];  Service: Cardiovascular;  Laterality: Left;   A/V FISTULAGRAM Left 04/11/2021   Procedure: A/V Fistulagram;  Surgeon: Annice Needy, MD;  Location: [facility];  Service: Cardiovascular;  Laterality: Left;   AV FISTULA PLACEMENT Left 07/28/2020   Procedure: ARTERIOVENOUS (AV) FISTULA CREATION (BRACHIAL CEPHALIC );  Surgeon: Renford Dills, MD;  Location: [facility];  Service: Vascular;  Laterality: Left;   BREAST EXCISIONAL BIOPSY Left 2010   benign   BREAST SURGERY  Lt breast biopsy   DIALYSIS/PERMA CATHETER INSERTION N/A 04/10/2020   Procedure: DIALYSIS/PERMA CATHETER INSERTION;  Surgeon: Annice Needy, MD;  Location: [facility];  Service: Cardiovascular;  Laterality: N/A;   DIALYSIS/PERMA CATHETER REMOVAL N/A 01/30/2021   Procedure: DIALYSIS/PERMA CATHETER REMOVAL;  Surgeon: Renford Dills, MD;  Location: [facility];  Service:  Cardiovascular;  Laterality: N/A;   KNEE ARTHROSCOPY Left 10/05/2015   Procedure: ARTHROSCOPY KNEE partial medial and lateral meniscectomy, lateral release for sublux patella;  Surgeon: Kennedy Bucker, MD;  Location: [facility];  Service: Orthopedics;  Laterality: Left;    OB History   No obstetric history on file.      Home Medications    Prior to Admission medications   Medication Sig Start Date End Date Taking? Authorizing Provider  acetaminophen (TYLENOL) 500 MG tablet Take 1,000 mg by mouth every 6 (six) hours as needed.   Yes [provider]  aspirin 81 MG EC tablet Take 1 tablet by mouth daily. 04/12/21  Yes [provider]  aspirin EC 81 MG EC tablet Take 1 tablet (81 mg total) by mouth daily. Swallow whole. 04/12/21  Yes Marrion Coy, MD  calcium acetate (PHOSLO) 667 MG tablet Take 667 mg by mouth 3 (three) times daily.   Yes [provider]  hydrocortisone (ANUSOL-HC) 2.5 % rectal cream Place 1 Application rectally 2 (two) times daily. 04/28/23  Yes Rodriguez-Southworth, Nettie Elm, PA-C  losartan (COZAAR) 50 MG tablet Take 50 mg by mouth daily.   Yes [provider]  meclizine (ANTIVERT) 25 MG tablet TAKE 1 TABLET BY MOUTH 3 TIMES DAILY AS NEEDED FOR DIZZINESS OR NAUSEA. 03/26/23  Yes [provider]  multivitamin (RENA-VIT) TABS tablet Take 1 tablet by mouth daily.   Yes [provider]  NIFEdipine (PROCARDIA-XL/NIFEDICAL-XL) 30 MG 24 hr tablet Take 30 mg by mouth at bedtime.   Yes [provider]  omeprazole (PRILOSEC) 20 MG capsule Take 20 mg by mouth daily as needed (reflux).  08/18/20  Yes [provider]    Family History Family History  Problem Relation Age of Onset   Breast cancer Sister 46    Social History Social History   Tobacco Use   Smoking status: Never   Smokeless tobacco: Never  Vaping Use   Vaping status: Never Used  Substance Use Topics   Alcohol use: Not Currently    Comment: rare    Drug use: No     Allergies   Amlodipine, Azithromycin, Prednisone, and Tizanidine   Review of Systems Review of Systems As noted in HPI  Physical Exam Triage Vital Signs ED Triage Vitals  Encounter Vitals Group     BP 04/28/23 1736 (!) 161/74     Systolic BP Percentile --      Diastolic BP Percentile --      Pulse Rate 04/28/23 1736 79     Resp 04/28/23 1736 16     Temp 04/28/23 1736 98.3 F (36.8 C)     Temp Source 04/28/23 1736 Oral     SpO2 04/28/23 1736 96 %     Weight 04/28/23 1734 144 lb (65.3 kg)     Height 04/28/23 1734 4\' 9"  (1.448 m)     Head Circumference --      Peak Flow --      Pain Score 04/28/23 1740 5     Pain Loc --      Pain Education --      Exclude from  Growth Chart --    No data found.  Updated Vital Signs BP (!) 161/74 (BP Location: Left Arm)   Pulse 79   Temp 98.3 F (36.8 C) (Oral)   Resp 16   Ht 4\' 9"  (1.448 m)   Wt 144 lb (65.3 kg)   SpO2 96%   BMI 31.16 kg/m   Visual Acuity Right Eye Distance:   Left Eye Distance:   Bilateral Distance:    Right Eye Near:   Left Eye Near:    Bilateral Near:     Physical Exam Vitals and nursing note reviewed.  Constitutional:      General: She is not in acute distress.    Appearance: She is obese.  HENT:     Right Ear: External ear normal.     Left Ear: External ear normal.  Eyes:     General: No scleral icterus.    Conjunctiva/sclera: Conjunctivae normal.  Pulmonary:     Effort: Pulmonary effort is normal.  Abdominal:     Palpations: There is no mass.     Tenderness: There is abdominal tenderness in the right lower quadrant, suprapubic area and left upper quadrant. There is no guarding or rebound. Negative signs include psoas sign.  Musculoskeletal:        General: Normal range of motion.     Cervical back: Neck supple.  Skin:    General: Skin is warm and dry.     Findings: No rash.  Neurological:     Mental Status: She is alert and oriented to person, place, and time.      Gait: Gait normal.  Psychiatric:        Mood and Affect: Mood normal.        Behavior: Behavior normal.        Thought Content: Thought content normal.        Judgment: Judgment normal.      UC Treatments / Results  Labs (all labs ordered are listed, but only abnormal results are displayed) Labs Reviewed - No data to display   EKG   Radiology DG Abd 2 Views  Result Date: 04/28/2023 CLINICAL DATA:  RLQ and suprapubic pain, constipation EXAM: ABDOMEN - 2 VIEW COMPARISON:  CT abdomen pelvis 02/19/2020, x-ray abdomen 10/07/2016 FINDINGS: The bowel gas pattern is normal. Stool throughout the visualized colon with no definite increased stool burden. There is no evidence of free air. No radio-opaque calculi or other significant radiographic abnormality is seen. IMPRESSION: Negative. Electronically Signed   By: Tish Frederickson M.D.   On: 04/28/2023 20:02    Procedures Procedures (including critical care time)  Medications Ordered in UC Medications - No data to display  Initial Impression / Assessment and Plan / UC Course  I have reviewed the triage vital signs and the nursing notes.  Pertinent  imaging results that were available during my care of the patient were reviewed by me and considered in my medical decision making (see chart for details).  Abdominal pain with constipation Hemorrhoids  I advised her to drink a bottle of Mag Citrate tonight to help empty out her ascending colon where there is a lot of stool. If this does not help, she may try an enema. If the pain gets worse, then needs to go to ER for work up. I also prescribed her Anusol cream as noted.    Final Clinical Impressions(s) / UC Diagnoses   Final diagnoses:  Pain of upper abdomen  Slow transit constipation  Discharge Instructions      Compre Magnesium Citrate y tome toda la botella para limpiar su intestino. Si eso no funciona, comprese una enema  Si ambos no le ayudan y su dolor persiste, vaya a  emergencias       ED Prescriptions     Medication Sig Dispense Auth. Provider   hydrocortisone (ANUSOL-HC) 2.5 % rectal cream Place 1 Application rectally 2 (two) times daily. 30 g Rodriguez-Southworth, Nettie Elm, PA-C      PDMP not reviewed this encounter.   Garey Ham, Cordelia Poche 04/28/23 2036

## 2023-04-29 DIAGNOSIS — I4510 Unspecified right bundle-branch block: Secondary | ICD-10-CM | POA: Diagnosis not present

## 2023-04-29 DIAGNOSIS — I340 Nonrheumatic mitral (valve) insufficiency: Secondary | ICD-10-CM | POA: Diagnosis not present

## 2023-04-29 DIAGNOSIS — R0602 Shortness of breath: Secondary | ICD-10-CM | POA: Diagnosis not present

## 2023-04-30 DIAGNOSIS — N186 End stage renal disease: Secondary | ICD-10-CM | POA: Diagnosis not present

## 2023-04-30 DIAGNOSIS — I120 Hypertensive chronic kidney disease with stage 5 chronic kidney disease or end stage renal disease: Secondary | ICD-10-CM | POA: Diagnosis not present

## 2023-04-30 DIAGNOSIS — Z992 Dependence on renal dialysis: Secondary | ICD-10-CM | POA: Diagnosis not present

## 2023-04-30 DIAGNOSIS — N2581 Secondary hyperparathyroidism of renal origin: Secondary | ICD-10-CM | POA: Diagnosis not present

## 2023-05-02 ENCOUNTER — Telehealth (INDEPENDENT_AMBULATORY_CARE_PROVIDER_SITE_OTHER): Payer: Self-pay

## 2023-05-02 NOTE — Telephone Encounter (Signed)
Spoke with the patient's daughter and the patient is scheduled with Dr. Wyn Quaker on 05/08/23 with a 10:30 am arrival time to the [facility]. Pre-procedure instructions were discussed and will be mailed.

## 2023-05-05 DIAGNOSIS — N2581 Secondary hyperparathyroidism of renal origin: Secondary | ICD-10-CM | POA: Diagnosis not present

## 2023-05-05 DIAGNOSIS — N186 End stage renal disease: Secondary | ICD-10-CM | POA: Diagnosis not present

## 2023-05-05 DIAGNOSIS — Z992 Dependence on renal dialysis: Secondary | ICD-10-CM | POA: Diagnosis not present

## 2023-05-07 ENCOUNTER — Telehealth (INDEPENDENT_AMBULATORY_CARE_PROVIDER_SITE_OTHER): Payer: Self-pay

## 2023-05-07 DIAGNOSIS — Z992 Dependence on renal dialysis: Secondary | ICD-10-CM | POA: Diagnosis not present

## 2023-05-07 DIAGNOSIS — N2581 Secondary hyperparathyroidism of renal origin: Secondary | ICD-10-CM | POA: Diagnosis not present

## 2023-05-07 DIAGNOSIS — N186 End stage renal disease: Secondary | ICD-10-CM | POA: Diagnosis not present

## 2023-05-07 NOTE — Telephone Encounter (Signed)
Spoke with the patient's daughter and she has been rescheduled from 05/08/23 to 05/12/23 with a 1:00 pm arrival time to the [facility] to have a left arm fistulagram with Dr. Wyn Quaker.

## 2023-05-08 ENCOUNTER — Encounter: Payer: Self-pay | Admitting: Gastroenterology

## 2023-05-08 DIAGNOSIS — N186 End stage renal disease: Secondary | ICD-10-CM

## 2023-05-09 DIAGNOSIS — N2581 Secondary hyperparathyroidism of renal origin: Secondary | ICD-10-CM | POA: Diagnosis not present

## 2023-05-09 DIAGNOSIS — N186 End stage renal disease: Secondary | ICD-10-CM | POA: Diagnosis not present

## 2023-05-09 DIAGNOSIS — Z992 Dependence on renal dialysis: Secondary | ICD-10-CM | POA: Diagnosis not present

## 2023-05-12 ENCOUNTER — Ambulatory Visit
Admission: RE | Admit: 2023-05-12 | Discharge: 2023-05-12 | Disposition: A | Discharge status: Home / Self Care | Payer: Medicare HMO | Source: Home / Self Care | Attending: Vascular Surgery | Admitting: Vascular Surgery

## 2023-05-12 ENCOUNTER — Encounter
Admission: RE | Disposition: A | Discharge status: Home / Self Care | Payer: Self-pay | Source: Home / Self Care | Attending: Vascular Surgery

## 2023-05-12 ENCOUNTER — Other Ambulatory Visit: Payer: Self-pay

## 2023-05-12 DIAGNOSIS — Y841 Kidney dialysis as the cause of abnormal reaction of the patient, or of later complication, without mention of misadventure at the time of the procedure: Secondary | ICD-10-CM | POA: Diagnosis not present

## 2023-05-12 DIAGNOSIS — I120 Hypertensive chronic kidney disease with stage 5 chronic kidney disease or end stage renal disease: Secondary | ICD-10-CM | POA: Diagnosis not present

## 2023-05-12 DIAGNOSIS — Z992 Dependence on renal dialysis: Secondary | ICD-10-CM | POA: Diagnosis not present

## 2023-05-12 DIAGNOSIS — T82858A Stenosis of vascular prosthetic devices, implants and grafts, initial encounter: Secondary | ICD-10-CM | POA: Diagnosis not present

## 2023-05-12 DIAGNOSIS — T82898A Other specified complication of vascular prosthetic devices, implants and grafts, initial encounter: Secondary | ICD-10-CM | POA: Diagnosis not present

## 2023-05-12 DIAGNOSIS — N186 End stage renal disease: Secondary | ICD-10-CM | POA: Diagnosis not present

## 2023-05-12 HISTORY — PX: A/V FISTULAGRAM: CATH118298

## 2023-05-12 LAB — POTASSIUM (ARMC VASCULAR LAB ONLY): Potassium (ARMC vascular lab): 4.3 mmol/L (ref 3.5–5.1)

## 2023-05-12 SURGERY — A/V FISTULAGRAM
Anesthesia: Moderate Sedation | Laterality: Left

## 2023-05-12 MED ORDER — CEFAZOLIN SODIUM-DEXTROSE 1-4 GM/50ML-% IV SOLN
INTRAVENOUS | Status: AC
  Filled 2023-05-12: qty 50

## 2023-05-12 MED ORDER — FENTANYL CITRATE (PF) 100 MCG/2ML IJ SOLN
INTRAMUSCULAR | Status: AC
  Filled 2023-05-12: qty 2

## 2023-05-12 MED ORDER — HYDROMORPHONE HCL 1 MG/ML IJ SOLN: 1.0000 mg | Freq: Once | INTRAMUSCULAR | Status: DC | PRN

## 2023-05-12 MED ORDER — LIDOCAINE-EPINEPHRINE (PF) 1 %-1:200000 IJ SOLN
INTRAMUSCULAR | Status: DC | PRN
  Administered 2023-05-12: 10 mL

## 2023-05-12 MED ORDER — HEPARIN (PORCINE) IN NACL 1000-0.9 UT/500ML-% IV SOLN
INTRAVENOUS | Status: DC | PRN
  Administered 2023-05-12: 1000 mL

## 2023-05-12 MED ORDER — METHYLPREDNISOLONE SODIUM SUCC 125 MG IJ SOLR: 125.0000 mg | Freq: Once | INTRAMUSCULAR | Status: DC | PRN

## 2023-05-12 MED ORDER — FENTANYL CITRATE (PF) 100 MCG/2ML IJ SOLN
INTRAMUSCULAR | Status: DC | PRN
  Administered 2023-05-12: 50 ug via INTRAVENOUS

## 2023-05-12 MED ORDER — MIDAZOLAM HCL 2 MG/2ML IJ SOLN
INTRAMUSCULAR | Status: DC | PRN
  Administered 2023-05-12: 1 mg via INTRAVENOUS

## 2023-05-12 MED ORDER — MIDAZOLAM HCL 2 MG/2ML IJ SOLN
INTRAMUSCULAR | Status: AC
  Filled 2023-05-12: qty 2

## 2023-05-12 MED ORDER — HEPARIN SODIUM (PORCINE) 1000 UNIT/ML IJ SOLN
INTRAMUSCULAR | Status: AC
  Filled 2023-05-12: qty 10

## 2023-05-12 MED ORDER — ONDANSETRON HCL 4 MG/2ML IJ SOLN: 4.0000 mg | Freq: Four times a day (QID) | INTRAMUSCULAR | Status: DC | PRN

## 2023-05-12 MED ORDER — SODIUM CHLORIDE 0.9 % IV SOLN: INTRAVENOUS | Status: DC

## 2023-05-12 MED ORDER — DIPHENHYDRAMINE HCL 50 MG/ML IJ SOLN: 50.0000 mg | Freq: Once | INTRAMUSCULAR | Status: DC | PRN

## 2023-05-12 MED ORDER — CEFAZOLIN SODIUM-DEXTROSE 1-4 GM/50ML-% IV SOLN
1.0000 g | INTRAVENOUS | Status: AC
  Administered 2023-05-12: 1 g via INTRAVENOUS

## 2023-05-12 MED ORDER — IODIXANOL 320 MG/ML IV SOLN
INTRAVENOUS | Status: DC | PRN
  Administered 2023-05-12: 35 mL

## 2023-05-12 MED ORDER — FAMOTIDINE 20 MG PO TABS: 40.0000 mg | ORAL_TABLET | Freq: Once | ORAL | Status: DC | PRN

## 2023-05-12 MED ORDER — MIDAZOLAM HCL 2 MG/ML PO SYRP: 8.0000 mg | ORAL_SOLUTION | Freq: Once | ORAL | Status: DC | PRN

## 2023-05-12 SURGICAL SUPPLY — 14 items
BALLN LUTONIX AV 7X60X75 (BALLOONS) ×1
BALLN LUTONIX AV 9X60X75 (BALLOONS) ×1
BALLOON LUTONIX AV 7X60X75 (BALLOONS) IMPLANT
BALLOON LUTONIX AV 9X60X75 (BALLOONS) IMPLANT
CATH BEACON 5 .035 40 KMP TP (CATHETERS) IMPLANT
DRAPE BRACHIAL (DRAPES) IMPLANT
GLIDEWIRE ADV .035X180CM (WIRE) IMPLANT
KIT ENCORE 26 ADVANTAGE (KITS) IMPLANT
KIT MICROPUNCTURE NIT STIFF (SHEATH) IMPLANT
NDL ENTRY 21GA 7CM ECHOTIP (NEEDLE) IMPLANT
NEEDLE ENTRY 21GA 7CM ECHOTIP (NEEDLE) ×1 IMPLANT
PACK ANGIOGRAPHY (CUSTOM PROCEDURE TRAY) ×1 IMPLANT
SHEATH BRITE TIP 6FRX5.5 (SHEATH) IMPLANT
SUT MNCRL AB 4-0 PS2 18 (SUTURE) IMPLANT

## 2023-05-12 NOTE — H&P (Signed)
[facility] Admission History & Physical  MRN : [id number]  Leah Davies is a 82 y.o. (13-Jul-1941) female who presents with chief complaint of No chief complaint on file. Marland Kitchen  History of Present Illness: I am asked to evaluate the patient by the dialysis center. The patient was sent here because they were unable to achieve adequate dialysis this morning. Furthermore the Center states there is very poor thrill and bruit. The patient states there there have been increasing problems with the access, such as "pulling clots" during dialysis and prolonged bleeding after decannulation. The patient estimates these problems have been going on for several weeks. The patient is unaware of any other change.   Patient denies pain or tenderness overlying the access.  There is no pain with dialysis.  The patient denies hand pain or finger pain consistent with steal syndrome.    There have been past interventions or declots of this access.  The patient is not chronically hypotensive on dialysis.  No current facility-administered medications for this encounter.    Past Medical History:  Diagnosis Date   Arthritis    Chronic kidney disease    renal insufficiency   Complication of anesthesia    Vital signs low during colonoscopy had trouble to wake up    Elevated lipids    GERD (gastroesophageal reflux disease)    Glaucoma    Gout    HLD (hyperlipidemia)    Hypertension     Past Surgical History:  Procedure Laterality Date   A/V FISTULAGRAM Left 08/22/2020   Procedure: A/V FISTULAGRAM;  Surgeon: Renford Dills, MD;  Location: [facility];  Service: Cardiovascular;  Laterality: Left;   A/V FISTULAGRAM Left 01/02/2021   Procedure: A/V FISTULAGRAM;  Surgeon: Renford Dills, MD;  Location: [facility];  Service: Cardiovascular;  Laterality: Left;   A/V FISTULAGRAM Left 04/11/2021   Procedure: A/V Fistulagram;  Surgeon: Annice Needy, MD;  Location: [facility]  [facility];  Service: Cardiovascular;  Laterality: Left;   AV FISTULA PLACEMENT Left 07/28/2020   Procedure: ARTERIOVENOUS (AV) FISTULA CREATION (BRACHIAL CEPHALIC );  Surgeon: Renford Dills, MD;  Location: [facility];  Service: Vascular;  Laterality: Left;   BREAST EXCISIONAL BIOPSY Left 2010   benign   BREAST SURGERY     Lt breast biopsy   DIALYSIS/PERMA CATHETER INSERTION N/A 04/10/2020   Procedure: DIALYSIS/PERMA CATHETER INSERTION;  Surgeon: Annice Needy, MD;  Location: [facility];  Service: Cardiovascular;  Laterality: N/A;   DIALYSIS/PERMA CATHETER REMOVAL N/A 01/30/2021   Procedure: DIALYSIS/PERMA CATHETER REMOVAL;  Surgeon: Renford Dills, MD;  Location: [facility];  Service: Cardiovascular;  Laterality: N/A;   KNEE ARTHROSCOPY Left 10/05/2015   Procedure: ARTHROSCOPY KNEE partial medial and lateral meniscectomy, lateral release for sublux patella;  Surgeon: Kennedy Bucker, MD;  Location: [facility];  Service: Orthopedics;  Laterality: Left;     Social History   Tobacco Use   Smoking status: Never   Smokeless tobacco: Never  Vaping Use   Vaping status: Never Used  Substance Use Topics   Alcohol use: Not Currently    Comment: rare   Drug use: No     Family History  Problem Relation Age of Onset   Breast cancer Sister 62    No family history of bleeding or clotting disorders, autoimmune disease or porphyria  Allergies  Allergen Reactions   Amlodipine Hypertension, Other (See Comments) and Rash  Caused increased blood pressure  Other reaction(s): "kidney failure" Caused increased blood pressure    Other reaction(s): Unknown Other reaction(s): Unknown    Caused increased blood pressure   Azithromycin Other (See Comments)    Unknown  Other reaction(s): Unknown Unknown    unknown    Unknown   Prednisone Other (See Comments)    Pt states she feels like her BP dropped, got dizzy, and very pale.   Tizanidine Other (See Comments)     Dizziness, pt states BP dropped, and very pale.      REVIEW OF SYSTEMS (Negative unless checked)  Constitutional: [] Weight loss  [] Fever  [] Chills Cardiac: [] Chest pain   [] Chest pressure   [] Palpitations   [] Shortness of breath when laying flat   [] Shortness of breath at rest   [x] Shortness of breath with exertion. Vascular:  [] Pain in legs with walking   [] Pain in legs at rest   [] Pain in legs when laying flat   [] Claudication   [] Pain in feet when walking  [] Pain in feet at rest  [] Pain in feet when laying flat   [] History of DVT   [] Phlebitis   [] Swelling in legs   [] Varicose veins   [] Non-healing ulcers Pulmonary:   [] Uses home oxygen   [] Productive cough   [] Hemoptysis   [] Wheeze  [] COPD   [] Asthma Neurologic:  [] Dizziness  [] Blackouts   [] Seizures   [] History of stroke   [] History of TIA  [] Aphasia   [] Temporary blindness   [] Dysphagia   [] Weakness or numbness in arms   [] Weakness or numbness in legs Musculoskeletal:  [x] Arthritis   [] Joint swelling   [] Joint pain   [] Low back pain Hematologic:  [] Easy bruising  [] Easy bleeding   [] Hypercoagulable state   [x] Anemic  [] Hepatitis Gastrointestinal:  [] Blood in stool   [] Vomiting blood  [x] Gastroesophageal reflux/heartburn   [] Difficulty swallowing. Genitourinary:  [x] Chronic kidney disease   [] Difficult urination  [] Frequent urination  [] Burning with urination   [] Blood in urine Skin:  [] Rashes   [] Ulcers   [] Wounds Psychological:  [] History of anxiety   []  History of major depression.  Physical Examination  There were no vitals filed for this visit. There is no height or weight on file to calculate BMI. Gen: WD/WN, NAD Head: [employer]/AT, No temporalis wasting.  Ear/Nose/Throat: Hearing grossly intact, nares w/o erythema or drainage, oropharynx w/o Erythema/Exudate,  Eyes: Conjunctiva clear, sclera non-icteric Neck: Trachea midline.  No JVD.  Pulmonary:  Good air movement, respirations not labored, no use of accessory muscles.  Cardiac:  RRR, normal S1, S2. Vascular: fistula is pulsatile Vessel Right Left  Radial Palpable Palpable   Musculoskeletal: M/S 5/5 throughout.  Extremities without ischemic changes.  No deformity or atrophy.  Neurologic: Sensation grossly intact in extremities.  Symmetrical.  Speech is fluent. Motor exam as listed above. Psychiatric: Judgment intact, Mood & affect appropriate for pt's clinical situation. Dermatologic: No rashes or ulcers noted.  No cellulitis or open wounds.    CBC Lab Results  Component Value Date   WBC 6.6 04/10/2021   HGB 10.2 (L) 04/10/2021   HCT 30.5 (L) 04/10/2021   MCV 95.0 04/10/2021   PLT 187 04/10/2021    BMET    Component Value Date/Time   NA 138 04/10/2021 0934   K 5.3 (H) 04/10/2021 0934   CL 106 04/10/2021 0934   CO2 21 (L) 04/10/2021 0934   GLUCOSE 108 (H) 04/10/2021 0934   BUN 80 (H) 04/10/2021 0934   CREATININE 7.13 (H) 04/10/2021 4696  CALCIUM 9.2 04/10/2021 0934   GFRNONAA 5 (L) 04/10/2021 0934   GFRAA 8 (L) 06/26/2020 1925   CrCl cannot be calculated (Patient's most recent lab result is older than the maximum 21 days allowed.).  COAG Lab Results  Component Value Date   INR 1.0 07/25/2020    Radiology DG Abd 2 Views  Result Date: 04/28/2023 CLINICAL DATA:  RLQ and suprapubic pain, constipation EXAM: ABDOMEN - 2 VIEW COMPARISON:  CT abdomen pelvis 02/19/2020, x-ray abdomen 10/07/2016 FINDINGS: The bowel gas pattern is normal. Stool throughout the visualized colon with no definite increased stool burden. There is no evidence of free air. No radio-opaque calculi or other significant radiographic abnormality is seen. IMPRESSION: Negative. Electronically Signed   By: Tish Frederickson M.D.   On: 04/28/2023 20:02    Assessment/Plan 1.  Complication dialysis device with dysfunction of AV access:  Patient's dialysis access is malfunctioning. The patient will undergo angiography and correction of any problems using interventional techniques with the  hope of restoring function to the access.  The risks and benefits were described to the patient.  All questions were answered.  The patient agrees to proceed with angiography and intervention. Potassium will be drawn to ensure that it is an appropriate level prior to performing intervention. 2.  End-stage renal disease requiring hemodialysis:  Patient will continue dialysis therapy without further interruption if a successful intervention is not achieved then a tunneled catheter will be placed. Dialysis has already been arranged. 3.  Hypertension:  Patient will continue medical management; nephrology is following no changes in oral medications.     Festus Barren, MD  05/12/2023 12:49 PM

## 2023-05-12 NOTE — Op Note (Signed)
[facility]    OPERATIVE NOTE   PROCEDURE: 1.   Left brachiocephalic arteriovenous fistula cannulation under ultrasound guidance in both an antegrade and retrograde fashion 2.   Left arm fistulagram including central venogram 3.   Percutaneous transluminal angioplasty of the cephalic vein in the distal upper arm with 7 and 9 mm diameter Lutonix drug-coated angioplasty balloons  PRE-OPERATIVE DIAGNOSIS: 1. ESRD 2. 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  Once the fistula normalized the cephalic vein in the mid and proximal upper arm was patent and the central venous circulation was patent.  SPECIMEN(S):  None  CONTRAST: 35 cc  FLUORO TIME: 3.1 minutes  MODERATE CONSCIOUS SEDATION TIME: Approximately 29 minutes with 1 mg of Versed and 50 mcg of Fentanyl   INDICATIONS: Leah Davies is a 82 y.o. female who presents with malfunctioning left brachiocephalic arteriovenous fistula.  The patient is scheduled for left arm fistulagram.  The patient is aware the risks include but are not limited to: bleeding, infection, thrombosis of the cannulated access, and possible anaphylactic reaction to the contrast.  The patient is aware of the risks of the procedure and elects to proceed forward.  DESCRIPTION: After full informed written consent was obtained, the patient was brought back to the angiography suite and placed supine upon the angiography table.  The patient was connected to monitoring equipment. Moderate conscious sedation was administered with a face to face encounter with the patient throughout the procedure with my supervision of the RN administering medicines  and monitoring the patient's vital signs and mental status throughout from the start of the procedure until the patient was taken to the recovery room. The left arm was prepped and draped in the standard fashion for a percutaneous access intervention.  Under ultrasound guidance, the left brachiocephalic arteriovenous fistula was cannulated with a micropuncture needle under direct ultrasound guidance where it was patent and a permanent image was performed.  The microwire was advanced into the fistula and the needle was exchanged for the a microsheath.  Hand injections were completed to image the access including the central venous system. This demonstrated greater than 90% stenosis of the cephalic vein about 5 cm distal to the anastomosis between access sites.  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  Once the fistula normalized the cephalic vein in the mid and proximal upper arm was patent and the central venous circulation was patent.  Based on the images, this patient will need intervention to the stenosis, but the antegrade access would not allow intervention from the site because it was too close to the access site.  I then removed the sheath with a 4-0 Monocryl pursestring suture and accessed the fistula in the proximal to mid upper arm cephalic vein.  This was done under direct ultrasound guidance in a retrograde fashion and a permanent image was recorded.  A micropuncture needle was used with a micropuncture wire and sheath placed initially and then upsizing to a 6 [place] sheath. I then gave the patient 3000 units of intravenous heparin.  I then crossed the stenosis with a Magic Tourqe wire.  Based on the imaging, a 7 mm x 6 cm Lutonix  drug-coated angioplasty balloon was selected.  The balloon was centered around the distal upper arm cephalic vein stenosis and inflated to 12 ATM for 1 minute(s).  This was slightly undersized so I then upsized to a 9 mm diameter by 6 cm length Lutonix  drug-coated angioplasty balloon and treated the same area once more.  This inflation was at 6 atm for 1 minute.  On completion imaging, a 15-20% residual stenosis was present.     Based on the completion imaging, no further intervention is necessary.  The wire and balloon were removed from the sheath.  A 4-0 Monocryl purse-string suture was sewn around the sheath.  The sheath was removed while tying down the suture.  A sterile bandage was applied to the puncture site.  COMPLICATIONS: None  CONDITION: Stable   Festus Barren  05/12/2023 3:06 PM   This note was created with Dragon Medical transcription system. Any errors in dictation are purely unintentional.

## 2023-05-13 ENCOUNTER — Encounter: Payer: Self-pay | Admitting: Vascular Surgery

## 2023-05-13 DIAGNOSIS — Z1211 Encounter for screening for malignant neoplasm of colon: Secondary | ICD-10-CM | POA: Diagnosis not present

## 2023-05-13 DIAGNOSIS — Z992 Dependence on renal dialysis: Secondary | ICD-10-CM | POA: Diagnosis not present

## 2023-05-13 DIAGNOSIS — N2581 Secondary hyperparathyroidism of renal origin: Secondary | ICD-10-CM | POA: Diagnosis not present

## 2023-05-13 DIAGNOSIS — Z1331 Encounter for screening for depression: Secondary | ICD-10-CM | POA: Diagnosis not present

## 2023-05-13 DIAGNOSIS — Z0000 Encounter for general adult medical examination without abnormal findings: Secondary | ICD-10-CM | POA: Diagnosis not present

## 2023-05-13 DIAGNOSIS — N186 End stage renal disease: Secondary | ICD-10-CM | POA: Diagnosis not present

## 2023-05-13 DIAGNOSIS — I120 Hypertensive chronic kidney disease with stage 5 chronic kidney disease or end stage renal disease: Secondary | ICD-10-CM | POA: Diagnosis not present

## 2023-05-14 DIAGNOSIS — N186 End stage renal disease: Secondary | ICD-10-CM | POA: Diagnosis not present

## 2023-05-14 DIAGNOSIS — N2581 Secondary hyperparathyroidism of renal origin: Secondary | ICD-10-CM | POA: Diagnosis not present

## 2023-05-14 DIAGNOSIS — Z992 Dependence on renal dialysis: Secondary | ICD-10-CM | POA: Diagnosis not present

## 2023-05-15 ENCOUNTER — Encounter: Admission: RE | Payer: Self-pay | Source: Home / Self Care

## 2023-05-15 ENCOUNTER — Ambulatory Visit: Admission: RE | Admit: 2023-05-15 | Payer: Medicare HMO | Source: Home / Self Care | Admitting: Gastroenterology

## 2023-05-15 SURGERY — COLONOSCOPY WITH PROPOFOL
Anesthesia: General

## 2023-05-16 DIAGNOSIS — N186 End stage renal disease: Secondary | ICD-10-CM | POA: Diagnosis not present

## 2023-05-16 DIAGNOSIS — Z992 Dependence on renal dialysis: Secondary | ICD-10-CM | POA: Diagnosis not present

## 2023-05-16 DIAGNOSIS — N2581 Secondary hyperparathyroidism of renal origin: Secondary | ICD-10-CM | POA: Diagnosis not present

## 2023-05-19 DIAGNOSIS — N2581 Secondary hyperparathyroidism of renal origin: Secondary | ICD-10-CM | POA: Diagnosis not present

## 2023-05-19 DIAGNOSIS — Z992 Dependence on renal dialysis: Secondary | ICD-10-CM | POA: Diagnosis not present

## 2023-05-19 DIAGNOSIS — N186 End stage renal disease: Secondary | ICD-10-CM | POA: Diagnosis not present

## 2023-05-23 DIAGNOSIS — Z992 Dependence on renal dialysis: Secondary | ICD-10-CM | POA: Diagnosis not present

## 2023-05-23 DIAGNOSIS — N186 End stage renal disease: Secondary | ICD-10-CM | POA: Diagnosis not present

## 2023-05-23 DIAGNOSIS — N2581 Secondary hyperparathyroidism of renal origin: Secondary | ICD-10-CM | POA: Diagnosis not present

## 2023-05-26 DIAGNOSIS — Z992 Dependence on renal dialysis: Secondary | ICD-10-CM | POA: Diagnosis not present

## 2023-05-26 DIAGNOSIS — N2581 Secondary hyperparathyroidism of renal origin: Secondary | ICD-10-CM | POA: Diagnosis not present

## 2023-05-26 DIAGNOSIS — N186 End stage renal disease: Secondary | ICD-10-CM | POA: Diagnosis not present

## 2023-05-28 DIAGNOSIS — N186 End stage renal disease: Secondary | ICD-10-CM | POA: Diagnosis not present

## 2023-05-28 DIAGNOSIS — Z992 Dependence on renal dialysis: Secondary | ICD-10-CM | POA: Diagnosis not present

## 2023-05-28 DIAGNOSIS — N2581 Secondary hyperparathyroidism of renal origin: Secondary | ICD-10-CM | POA: Diagnosis not present

## 2023-05-30 DIAGNOSIS — Z992 Dependence on renal dialysis: Secondary | ICD-10-CM | POA: Diagnosis not present

## 2023-05-30 DIAGNOSIS — N2581 Secondary hyperparathyroidism of renal origin: Secondary | ICD-10-CM | POA: Diagnosis not present

## 2023-05-30 DIAGNOSIS — N186 End stage renal disease: Secondary | ICD-10-CM | POA: Diagnosis not present

## 2023-05-31 DIAGNOSIS — N186 End stage renal disease: Secondary | ICD-10-CM | POA: Diagnosis not present

## 2023-05-31 DIAGNOSIS — I120 Hypertensive chronic kidney disease with stage 5 chronic kidney disease or end stage renal disease: Secondary | ICD-10-CM | POA: Diagnosis not present

## 2023-05-31 DIAGNOSIS — Z992 Dependence on renal dialysis: Secondary | ICD-10-CM | POA: Diagnosis not present

## 2023-06-02 DIAGNOSIS — N2581 Secondary hyperparathyroidism of renal origin: Secondary | ICD-10-CM | POA: Diagnosis not present

## 2023-06-02 DIAGNOSIS — N186 End stage renal disease: Secondary | ICD-10-CM | POA: Diagnosis not present

## 2023-06-02 DIAGNOSIS — Z992 Dependence on renal dialysis: Secondary | ICD-10-CM | POA: Diagnosis not present

## 2023-06-06 DIAGNOSIS — Z992 Dependence on renal dialysis: Secondary | ICD-10-CM | POA: Diagnosis not present

## 2023-06-06 DIAGNOSIS — N2581 Secondary hyperparathyroidism of renal origin: Secondary | ICD-10-CM | POA: Diagnosis not present

## 2023-06-06 DIAGNOSIS — N186 End stage renal disease: Secondary | ICD-10-CM | POA: Diagnosis not present

## 2023-06-09 DIAGNOSIS — N186 End stage renal disease: Secondary | ICD-10-CM | POA: Diagnosis not present

## 2023-06-09 DIAGNOSIS — Z992 Dependence on renal dialysis: Secondary | ICD-10-CM | POA: Diagnosis not present

## 2023-06-09 DIAGNOSIS — N2581 Secondary hyperparathyroidism of renal origin: Secondary | ICD-10-CM | POA: Diagnosis not present

## 2023-06-11 DIAGNOSIS — N186 End stage renal disease: Secondary | ICD-10-CM | POA: Diagnosis not present

## 2023-06-11 DIAGNOSIS — Z992 Dependence on renal dialysis: Secondary | ICD-10-CM | POA: Diagnosis not present

## 2023-06-11 DIAGNOSIS — N2581 Secondary hyperparathyroidism of renal origin: Secondary | ICD-10-CM | POA: Diagnosis not present

## 2023-06-12 ENCOUNTER — Telehealth: Payer: Self-pay

## 2023-06-12 DIAGNOSIS — N186 End stage renal disease: Secondary | ICD-10-CM

## 2023-06-13 ENCOUNTER — Telehealth: Payer: Self-pay | Admitting: *Deleted

## 2023-06-13 DIAGNOSIS — N2581 Secondary hyperparathyroidism of renal origin: Secondary | ICD-10-CM | POA: Diagnosis not present

## 2023-06-13 DIAGNOSIS — N186 End stage renal disease: Secondary | ICD-10-CM | POA: Diagnosis not present

## 2023-06-13 DIAGNOSIS — Z992 Dependence on renal dialysis: Secondary | ICD-10-CM | POA: Diagnosis not present

## 2023-06-13 NOTE — Progress Notes (Signed)
Care Coordination   Note   06/13/2023 Name: Leah Davies MRN: [id number] DOB: 21-Dec-1940  Leah Davies is a 82 y.o. year old female who sees Enid Baas, MD for primary care. I reached out to Cristie Hem by phone today to offer care coordination services.  [name] was given information about Care Coordination services today including:   The Care Coordination services include support from the care team which includes your Nurse Coordinator, Clinical Social Worker, or Pharmacist.  The Care Coordination team is here to help remove barriers to the health concerns and goals most important to you. Care Coordination services are voluntary, and the patient may decline or stop services at any time by request to their care team member.   Care Coordination Consent Status: Patient agreed to services and verbal consent obtained.   Follow up plan:  Telephone appointment with care coordination team member scheduled for:  07/09/2023  Encounter Outcome:  Patient Scheduled from referral   Burman Nieves, [facility] Care Coordination Care Guide Direct Dial: [phone]

## 2023-06-16 DIAGNOSIS — Z992 Dependence on renal dialysis: Secondary | ICD-10-CM | POA: Diagnosis not present

## 2023-06-16 DIAGNOSIS — N2581 Secondary hyperparathyroidism of renal origin: Secondary | ICD-10-CM | POA: Diagnosis not present

## 2023-06-16 DIAGNOSIS — N186 End stage renal disease: Secondary | ICD-10-CM | POA: Diagnosis not present

## 2023-06-18 DIAGNOSIS — N186 End stage renal disease: Secondary | ICD-10-CM | POA: Diagnosis not present

## 2023-06-18 DIAGNOSIS — N2581 Secondary hyperparathyroidism of renal origin: Secondary | ICD-10-CM | POA: Diagnosis not present

## 2023-06-18 DIAGNOSIS — Z992 Dependence on renal dialysis: Secondary | ICD-10-CM | POA: Diagnosis not present

## 2023-06-20 DIAGNOSIS — Z992 Dependence on renal dialysis: Secondary | ICD-10-CM | POA: Diagnosis not present

## 2023-06-20 DIAGNOSIS — N2581 Secondary hyperparathyroidism of renal origin: Secondary | ICD-10-CM | POA: Diagnosis not present

## 2023-06-20 DIAGNOSIS — N186 End stage renal disease: Secondary | ICD-10-CM | POA: Diagnosis not present

## 2023-06-23 DIAGNOSIS — N2581 Secondary hyperparathyroidism of renal origin: Secondary | ICD-10-CM | POA: Diagnosis not present

## 2023-06-23 DIAGNOSIS — Z992 Dependence on renal dialysis: Secondary | ICD-10-CM | POA: Diagnosis not present

## 2023-06-23 DIAGNOSIS — N186 End stage renal disease: Secondary | ICD-10-CM | POA: Diagnosis not present

## 2023-06-27 ENCOUNTER — Other Ambulatory Visit (INDEPENDENT_AMBULATORY_CARE_PROVIDER_SITE_OTHER): Payer: Self-pay | Admitting: Vascular Surgery

## 2023-06-27 DIAGNOSIS — N2581 Secondary hyperparathyroidism of renal origin: Secondary | ICD-10-CM | POA: Diagnosis not present

## 2023-06-27 DIAGNOSIS — Z992 Dependence on renal dialysis: Secondary | ICD-10-CM | POA: Diagnosis not present

## 2023-06-27 DIAGNOSIS — N186 End stage renal disease: Secondary | ICD-10-CM

## 2023-06-30 DIAGNOSIS — N186 End stage renal disease: Secondary | ICD-10-CM | POA: Diagnosis not present

## 2023-06-30 DIAGNOSIS — N2581 Secondary hyperparathyroidism of renal origin: Secondary | ICD-10-CM | POA: Diagnosis not present

## 2023-06-30 DIAGNOSIS — Z992 Dependence on renal dialysis: Secondary | ICD-10-CM | POA: Diagnosis not present

## 2023-06-30 DIAGNOSIS — I120 Hypertensive chronic kidney disease with stage 5 chronic kidney disease or end stage renal disease: Secondary | ICD-10-CM | POA: Diagnosis not present

## 2023-07-01 DIAGNOSIS — I340 Nonrheumatic mitral (valve) insufficiency: Secondary | ICD-10-CM | POA: Diagnosis not present

## 2023-07-01 DIAGNOSIS — R0602 Shortness of breath: Secondary | ICD-10-CM | POA: Diagnosis not present

## 2023-07-01 DIAGNOSIS — I4510 Unspecified right bundle-branch block: Secondary | ICD-10-CM | POA: Diagnosis not present

## 2023-07-03 ENCOUNTER — Ambulatory Visit (INDEPENDENT_AMBULATORY_CARE_PROVIDER_SITE_OTHER): Payer: Medicare HMO

## 2023-07-03 ENCOUNTER — Encounter (INDEPENDENT_AMBULATORY_CARE_PROVIDER_SITE_OTHER): Payer: Self-pay | Admitting: Nurse Practitioner

## 2023-07-03 ENCOUNTER — Ambulatory Visit (INDEPENDENT_AMBULATORY_CARE_PROVIDER_SITE_OTHER): Payer: Medicare HMO | Admitting: Nurse Practitioner

## 2023-07-03 VITALS — BP 155/61 | HR 71 | Resp 18 | Ht <= 58 in | Wt 145.0 lb

## 2023-07-03 DIAGNOSIS — E782 Mixed hyperlipidemia: Secondary | ICD-10-CM | POA: Diagnosis not present

## 2023-07-03 DIAGNOSIS — N186 End stage renal disease: Secondary | ICD-10-CM | POA: Diagnosis not present

## 2023-07-03 DIAGNOSIS — I10 Essential (primary) hypertension: Secondary | ICD-10-CM | POA: Diagnosis not present

## 2023-07-04 DIAGNOSIS — Z992 Dependence on renal dialysis: Secondary | ICD-10-CM | POA: Diagnosis not present

## 2023-07-04 DIAGNOSIS — N2581 Secondary hyperparathyroidism of renal origin: Secondary | ICD-10-CM | POA: Diagnosis not present

## 2023-07-04 DIAGNOSIS — N186 End stage renal disease: Secondary | ICD-10-CM | POA: Diagnosis not present

## 2023-07-07 DIAGNOSIS — Z992 Dependence on renal dialysis: Secondary | ICD-10-CM | POA: Diagnosis not present

## 2023-07-07 DIAGNOSIS — N2581 Secondary hyperparathyroidism of renal origin: Secondary | ICD-10-CM | POA: Diagnosis not present

## 2023-07-07 DIAGNOSIS — N186 End stage renal disease: Secondary | ICD-10-CM | POA: Diagnosis not present

## 2023-07-08 DIAGNOSIS — N186 End stage renal disease: Secondary | ICD-10-CM | POA: Diagnosis not present

## 2023-07-08 DIAGNOSIS — K219 Gastro-esophageal reflux disease without esophagitis: Secondary | ICD-10-CM | POA: Diagnosis not present

## 2023-07-08 DIAGNOSIS — I10 Essential (primary) hypertension: Secondary | ICD-10-CM | POA: Diagnosis not present

## 2023-07-08 DIAGNOSIS — E782 Mixed hyperlipidemia: Secondary | ICD-10-CM | POA: Diagnosis not present

## 2023-07-08 DIAGNOSIS — I119 Hypertensive heart disease without heart failure: Secondary | ICD-10-CM | POA: Diagnosis not present

## 2023-07-08 DIAGNOSIS — R0789 Other chest pain: Secondary | ICD-10-CM | POA: Diagnosis not present

## 2023-07-08 DIAGNOSIS — I4510 Unspecified right bundle-branch block: Secondary | ICD-10-CM | POA: Diagnosis not present

## 2023-07-08 DIAGNOSIS — I340 Nonrheumatic mitral (valve) insufficiency: Secondary | ICD-10-CM | POA: Diagnosis not present

## 2023-07-08 DIAGNOSIS — Z992 Dependence on renal dialysis: Secondary | ICD-10-CM | POA: Diagnosis not present

## 2023-07-09 ENCOUNTER — Ambulatory Visit: Payer: Self-pay | Admitting: *Deleted

## 2023-07-09 DIAGNOSIS — N2581 Secondary hyperparathyroidism of renal origin: Secondary | ICD-10-CM | POA: Diagnosis not present

## 2023-07-09 DIAGNOSIS — N186 End stage renal disease: Secondary | ICD-10-CM | POA: Diagnosis not present

## 2023-07-09 DIAGNOSIS — Z992 Dependence on renal dialysis: Secondary | ICD-10-CM | POA: Diagnosis not present

## 2023-07-09 NOTE — Patient Outreach (Signed)
Care Coordination   Follow Up Visit Note   07/09/2023 Name: Leah Davies MRN: [id number] DOB: Nov 10, 1940  Leah Davies is a 82 y.o. year old female who sees Enid Baas, MD for primary care. I spoke with  Cristie Hem by phone today via [facility], [place] [phone]  What matters to the patients health and wellness today?  Patient does not feel she has any care coordination needs today, but state her daughter, whom she lives with, would be a better person to talk to regarding needs and management of health.  Request to call her at a later date in the afternoon as she works in the morning.       SDOH assessments and interventions completed:  No     Care Coordination Interventions:  No, not indicated   Follow up plan:  Will have care guide to reschedule with daughter    Encounter Outcome:  Patient Visit Completed   Kemper Durie, RN, MSN, [facility] Care Management Care Management Coordinator [phone]

## 2023-07-11 DIAGNOSIS — N2581 Secondary hyperparathyroidism of renal origin: Secondary | ICD-10-CM | POA: Diagnosis not present

## 2023-07-11 DIAGNOSIS — Z992 Dependence on renal dialysis: Secondary | ICD-10-CM | POA: Diagnosis not present

## 2023-07-11 DIAGNOSIS — N186 End stage renal disease: Secondary | ICD-10-CM | POA: Diagnosis not present

## 2023-07-18 DIAGNOSIS — Z992 Dependence on renal dialysis: Secondary | ICD-10-CM | POA: Diagnosis not present

## 2023-07-18 DIAGNOSIS — N2581 Secondary hyperparathyroidism of renal origin: Secondary | ICD-10-CM | POA: Diagnosis not present

## 2023-07-18 DIAGNOSIS — N186 End stage renal disease: Secondary | ICD-10-CM | POA: Diagnosis not present

## 2023-07-20 NOTE — Progress Notes (Signed)
Subjective:    Patient ID: Leah Davies, female    DOB: 1941-08-09, 82 y.o.   MRN: [id number] Chief Complaint  Patient presents with   Follow-up    6 week follow up HDA    The patient returns to the office for followup status post intervention of their dialysis access left brachiocephalic AV fistula.   Following the intervention the access function has significantly improved, with better flow rates and improved KT/V. The patient has not been experiencing increased bleeding times following decannulation and the patient denies increased recirculation. The patient denies an increase in arm swelling.  The patient does have a noted deformity of the clavicle/chronic wounds.  She also endorses having some some numbness in her hand during dialysis.  She notes that it is not significantly uncomfortable for her but it is notable.  No recent shortening of the patient's walking distance or new symptoms consistent with claudication.  No history of rest pain symptoms. No new ulcers or wounds of the lower extremities have occurred.  The patient denies amaurosis fugax or recent TIA symptoms. There are no recent neurological changes noted. There is no history of DVT, PE or superficial thrombophlebitis. No recent episodes of angina or shortness of breath documented.   Duplex ultrasound of the AV access shows a patent access.  The previously noted stenosis is improved compared to last study.  Flow volume today is 1374 cc/min (previous flow volume was 1097 cc/min)       Review of Systems  Neurological:  Positive for light-headedness.  Hematological:  Does not bruise/bleed easily.  All other systems reviewed and are negative.      Objective:   Physical Exam Vitals reviewed.  HENT:     Head: Normocephalic.  Cardiovascular:     Rate and Rhythm: Normal rate.     Pulses:          Radial pulses are 2+ on the right side and 1+ on the left side.  Pulmonary:     Effort: Pulmonary effort is normal.   Skin:    General: Skin is warm and dry.  Neurological:     Mental Status: She is alert and oriented to person, place, and time.  Psychiatric:        Mood and Affect: Mood normal.        Behavior: Behavior normal.        Thought Content: Thought content normal.        Judgment: Judgment normal.     BP (!) 155/61 (BP Location: Right Arm)   Pulse 71   Resp 18   Ht 4' 9.5" (1.461 m)   Wt 145 lb (65.8 kg)   BMI 30.83 kg/m   Past Medical History:  Diagnosis Date   Arthritis    Chronic kidney disease    renal insufficiency   Complication of anesthesia    Vital signs low during colonoscopy had trouble to wake up    Elevated lipids    GERD (gastroesophageal reflux disease)    Glaucoma    Gout    HLD (hyperlipidemia)    Hypertension     Social History   Socioeconomic History   Marital status: Single    Spouse name: Not on file   Number of children: 6   Years of education: Not on file   Highest education level: Not on file  Occupational History   Not on file  Tobacco Use   Smoking status: Never   Smokeless tobacco: Never  Vaping Use   Vaping status: Never Used  Substance and Sexual Activity   Alcohol use: Not Currently    Comment: rare   Drug use: No   Sexual activity: Not on file  Other Topics Concern   Not on file  Social History Narrative   Lives with daughter Ashok Cordia.    Social Determinants of Health   Financial Resource Strain: Not on file  Food Insecurity: Not on file  Transportation Needs: Not on file  Physical Activity: Not on file  Stress: Not on file  Social Connections: Not on file  Intimate Partner Violence: Not on file    Past Surgical History:  Procedure Laterality Date   A/V FISTULAGRAM Left 08/22/2020   Procedure: A/V FISTULAGRAM;  Surgeon: Renford Dills, MD;  Location: [facility];  Service: Cardiovascular;  Laterality: Left;   A/V FISTULAGRAM Left 01/02/2021   Procedure: A/V FISTULAGRAM;  Surgeon: Renford Dills, MD;   Location: [facility];  Service: Cardiovascular;  Laterality: Left;   A/V FISTULAGRAM Left 04/11/2021   Procedure: A/V Fistulagram;  Surgeon: Annice Needy, MD;  Location: [facility];  Service: Cardiovascular;  Laterality: Left;   A/V FISTULAGRAM Left 05/12/2023   Procedure: A/V Fistulagram;  Surgeon: Annice Needy, MD;  Location: [facility];  Service: Cardiovascular;  Laterality: Left;   AV FISTULA PLACEMENT Left 07/28/2020   Procedure: ARTERIOVENOUS (AV) FISTULA CREATION (BRACHIAL CEPHALIC );  Surgeon: Renford Dills, MD;  Location: [facility];  Service: Vascular;  Laterality: Left;   BREAST EXCISIONAL BIOPSY Left 2010   benign   BREAST SURGERY     Lt breast biopsy   DIALYSIS/PERMA CATHETER INSERTION N/A 04/10/2020   Procedure: DIALYSIS/PERMA CATHETER INSERTION;  Surgeon: Annice Needy, MD;  Location: [facility];  Service: Cardiovascular;  Laterality: N/A;   DIALYSIS/PERMA CATHETER REMOVAL N/A 01/30/2021   Procedure: DIALYSIS/PERMA CATHETER REMOVAL;  Surgeon: Renford Dills, MD;  Location: [facility];  Service: Cardiovascular;  Laterality: N/A;   KNEE ARTHROSCOPY Left 10/05/2015   Procedure: ARTHROSCOPY KNEE partial medial and lateral meniscectomy, lateral release for sublux patella;  Surgeon: Kennedy Bucker, MD;  Location: [facility];  Service: Orthopedics;  Laterality: Left;    Family History  Problem Relation Age of Onset   Breast cancer Sister 34    Allergies  Allergen Reactions   Amlodipine Hypertension, Other (See Comments) and Rash    Caused increased blood pressure  Other reaction(s): "kidney failure" Caused increased blood pressure    Other reaction(s): Unknown Other reaction(s): Unknown    Caused increased blood pressure   Azithromycin Other (See Comments)    Unknown  Other reaction(s): Unknown Unknown    unknown    Unknown   Prednisone Other (See Comments)    Pt states she feels like her BP dropped, got dizzy, and very  pale.   Tizanidine Other (See Comments)    Dizziness, pt states BP dropped, and very pale.        Latest Ref Rng & Units 04/10/2021    9:24 AM 07/28/2020    9:06 AM 07/25/2020    9:51 AM  CBC  WBC 4.0 - 10.5 K/uL 6.6   7.5   Hemoglobin 12.0 - 15.0 g/dL 54.0  98.1  19.1   Hematocrit 36.0 - 46.0 % 30.5  35.0  35.7   Platelets 150 - 400 K/uL 187   162       CMP  Component Value Date/Time   NA 138 04/10/2021 0934   K 5.3 (H) 04/10/2021 0934   CL 106 04/10/2021 0934   CO2 21 (L) 04/10/2021 0934   GLUCOSE 108 (H) 04/10/2021 0934   BUN 80 (H) 04/10/2021 0934   CREATININE 7.13 (H) 04/10/2021 0934   CALCIUM 9.2 04/10/2021 0934   PROT 6.9 04/10/2021 0934   ALBUMIN 3.8 04/10/2021 0934   AST 30 04/10/2021 0934   ALT 24 04/10/2021 0934   ALKPHOS 66 04/10/2021 0934   BILITOT 1.0 04/10/2021 0934   GFRNONAA 5 (L) 04/10/2021 0934     No results found.     Assessment & Plan:   1. ESRD (end stage renal disease) (HCC) Following intervention the patient's flow and issues are improved however she does note some slight numbness of her hand during dialysis.  This is suspicious for possible steal syndrome.  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  If the patient does indeed have some evidence of steal angioplasty and treatment of the confluence we may actually worsen her steal symptoms.  Because of this we will plan on not performing any intervention because her fistula is currently working without any significant problems or issues.  We will maintain close follow-up and have her return in 3 months  2. Essential hypertension Continue antihypertensive medications as already ordered, these medications have been reviewed and there are no changes at this time.  3. Mixed hyperlipidemia Continue statin as ordered and reviewed, no changes at this time   Current Outpatient Medications on File Prior to Visit  Medication Sig Dispense Refill   acetaminophen (TYLENOL) 500 MG tablet Take  1,000 mg by mouth every 6 (six) hours as needed.     calcium acetate (PHOSLO) 667 MG tablet Take 667 mg by mouth 3 (three) times daily.     hydrocortisone (ANUSOL-HC) 2.5 % rectal cream Place 1 Application rectally 2 (two) times daily. 30 g 0   losartan (COZAAR) 50 MG tablet Take 50 mg by mouth daily.     meclizine (ANTIVERT) 25 MG tablet TAKE 1 TABLET BY MOUTH 3 TIMES DAILY AS NEEDED FOR DIZZINESS OR NAUSEA.     multivitamin (RENA-VIT) TABS tablet Take 1 tablet by mouth daily.     NIFEdipine (PROCARDIA-XL/NIFEDICAL-XL) 30 MG 24 hr tablet Take 30 mg by mouth at bedtime.     omeprazole (PRILOSEC) 20 MG capsule Take 20 mg by mouth daily as needed (reflux).      aspirin 81 MG EC tablet Take 1 tablet by mouth daily. (Patient not taking: Reported on 05/12/2023)     aspirin EC 81 MG EC tablet Take 1 tablet (81 mg total) by mouth daily. Swallow whole. (Patient not taking: Reported on 05/12/2023) 30 tablet 0   No current facility-administered medications on file prior to visit.    There are no Patient Instructions on file for this visit. No follow-ups on file.   Georgiana Spinner, NP

## 2023-07-21 DIAGNOSIS — N2581 Secondary hyperparathyroidism of renal origin: Secondary | ICD-10-CM | POA: Diagnosis not present

## 2023-07-21 DIAGNOSIS — N186 End stage renal disease: Secondary | ICD-10-CM | POA: Diagnosis not present

## 2023-07-21 DIAGNOSIS — Z992 Dependence on renal dialysis: Secondary | ICD-10-CM | POA: Diagnosis not present

## 2023-07-23 DIAGNOSIS — N2581 Secondary hyperparathyroidism of renal origin: Secondary | ICD-10-CM | POA: Diagnosis not present

## 2023-07-23 DIAGNOSIS — Z992 Dependence on renal dialysis: Secondary | ICD-10-CM | POA: Diagnosis not present

## 2023-07-23 DIAGNOSIS — N186 End stage renal disease: Secondary | ICD-10-CM | POA: Diagnosis not present

## 2023-07-25 ENCOUNTER — Ambulatory Visit: Payer: Self-pay | Admitting: *Deleted

## 2023-07-25 DIAGNOSIS — N186 End stage renal disease: Secondary | ICD-10-CM | POA: Diagnosis not present

## 2023-07-25 DIAGNOSIS — N2581 Secondary hyperparathyroidism of renal origin: Secondary | ICD-10-CM | POA: Diagnosis not present

## 2023-07-25 DIAGNOSIS — Z992 Dependence on renal dialysis: Secondary | ICD-10-CM | POA: Diagnosis not present
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
## 2023-07-28 DIAGNOSIS — N2581 Secondary hyperparathyroidism of renal origin: Secondary | ICD-10-CM | POA: Diagnosis not present

## 2023-07-28 DIAGNOSIS — N186 End stage renal disease: Secondary | ICD-10-CM | POA: Diagnosis not present

## 2023-07-28 DIAGNOSIS — Z992 Dependence on renal dialysis: Secondary | ICD-10-CM | POA: Diagnosis not present

## 2023-07-30 DIAGNOSIS — N2581 Secondary hyperparathyroidism of renal origin: Secondary | ICD-10-CM | POA: Diagnosis not present

## 2023-07-30 DIAGNOSIS — Z992 Dependence on renal dialysis: Secondary | ICD-10-CM | POA: Diagnosis not present

## 2023-07-30 DIAGNOSIS — N186 End stage renal disease: Secondary | ICD-10-CM | POA: Diagnosis not present

## 2023-07-31 DIAGNOSIS — N186 End stage renal disease: Secondary | ICD-10-CM | POA: Diagnosis not present

## 2023-07-31 DIAGNOSIS — Z992 Dependence on renal dialysis: Secondary | ICD-10-CM | POA: Diagnosis not present

## 2023-07-31 DIAGNOSIS — I120 Hypertensive chronic kidney disease with stage 5 chronic kidney disease or end stage renal disease: Secondary | ICD-10-CM | POA: Diagnosis not present

## 2023-08-01 DIAGNOSIS — N2581 Secondary hyperparathyroidism of renal origin: Secondary | ICD-10-CM | POA: Diagnosis not present

## 2023-08-01 DIAGNOSIS — N186 End stage renal disease: Secondary | ICD-10-CM | POA: Diagnosis not present

## 2023-08-01 DIAGNOSIS — Z992 Dependence on renal dialysis: Secondary | ICD-10-CM | POA: Diagnosis not present

## 2023-08-04 DIAGNOSIS — Z992 Dependence on renal dialysis: Secondary | ICD-10-CM | POA: Diagnosis not present

## 2023-08-04 DIAGNOSIS — N186 End stage renal disease: Secondary | ICD-10-CM | POA: Diagnosis not present

## 2023-08-04 DIAGNOSIS — N2581 Secondary hyperparathyroidism of renal origin: Secondary | ICD-10-CM | POA: Diagnosis not present

## 2023-08-08 DIAGNOSIS — N2581 Secondary hyperparathyroidism of renal origin: Secondary | ICD-10-CM | POA: Diagnosis not present

## 2023-08-08 DIAGNOSIS — Z992 Dependence on renal dialysis: Secondary | ICD-10-CM | POA: Diagnosis not present

## 2023-08-08 DIAGNOSIS — N186 End stage renal disease: Secondary | ICD-10-CM | POA: Diagnosis not present

## 2023-08-11 DIAGNOSIS — Z992 Dependence on renal dialysis: Secondary | ICD-10-CM | POA: Diagnosis not present

## 2023-08-11 DIAGNOSIS — N2581 Secondary hyperparathyroidism of renal origin: Secondary | ICD-10-CM | POA: Diagnosis not present

## 2023-08-11 DIAGNOSIS — N186 End stage renal disease: Secondary | ICD-10-CM | POA: Diagnosis not present

## 2023-08-13 DIAGNOSIS — Z992 Dependence on renal dialysis: Secondary | ICD-10-CM | POA: Diagnosis not present

## 2023-08-13 DIAGNOSIS — N2581 Secondary hyperparathyroidism of renal origin: Secondary | ICD-10-CM | POA: Diagnosis not present

## 2023-08-13 DIAGNOSIS — N186 End stage renal disease: Secondary | ICD-10-CM | POA: Diagnosis not present

## 2023-08-14 DIAGNOSIS — Z992 Dependence on renal dialysis: Secondary | ICD-10-CM | POA: Diagnosis not present

## 2023-08-14 DIAGNOSIS — I120 Hypertensive chronic kidney disease with stage 5 chronic kidney disease or end stage renal disease: Secondary | ICD-10-CM | POA: Diagnosis not present

## 2023-08-14 DIAGNOSIS — R252 Cramp and spasm: Secondary | ICD-10-CM | POA: Diagnosis not present

## 2023-08-14 DIAGNOSIS — N186 End stage renal disease: Secondary | ICD-10-CM | POA: Diagnosis not present

## 2023-08-15 DIAGNOSIS — N2581 Secondary hyperparathyroidism of renal origin: Secondary | ICD-10-CM | POA: Diagnosis not present

## 2023-08-15 DIAGNOSIS — Z992 Dependence on renal dialysis: Secondary | ICD-10-CM | POA: Diagnosis not present

## 2023-08-15 DIAGNOSIS — N186 End stage renal disease: Secondary | ICD-10-CM | POA: Diagnosis not present

## 2023-08-18 DIAGNOSIS — N186 End stage renal disease: Secondary | ICD-10-CM | POA: Diagnosis not present

## 2023-08-18 DIAGNOSIS — Z992 Dependence on renal dialysis: Secondary | ICD-10-CM | POA: Diagnosis not present

## 2023-08-18 DIAGNOSIS — N2581 Secondary hyperparathyroidism of renal origin: Secondary | ICD-10-CM | POA: Diagnosis not present

## 2023-08-20 DIAGNOSIS — N2581 Secondary hyperparathyroidism of renal origin: Secondary | ICD-10-CM | POA: Diagnosis not present

## 2023-08-20 DIAGNOSIS — Z992 Dependence on renal dialysis: Secondary | ICD-10-CM | POA: Diagnosis not present

## 2023-08-20 DIAGNOSIS — N186 End stage renal disease: Secondary | ICD-10-CM | POA: Diagnosis not present

## 2023-08-22 ENCOUNTER — Ambulatory Visit: Payer: Self-pay | Admitting: *Deleted

## 2023-08-22 DIAGNOSIS — Z992 Dependence on renal dialysis: Secondary | ICD-10-CM | POA: Diagnosis not present

## 2023-08-22 DIAGNOSIS — N186 End stage renal disease: Secondary | ICD-10-CM | POA: Diagnosis not present

## 2023-08-22 DIAGNOSIS — N2581 Secondary hyperparathyroidism of renal origin: Secondary | ICD-10-CM | POA: Diagnosis not present

## 2023-08-22 NOTE — Patient Outreach (Signed)
  Care Coordination   Follow Up Visit Note   08/22/2023 Name: Leah Davies MRN: [id number] DOB: 07-02-1941  Leah Davies is a 82 y.o. year old female who sees Leah Baas, MD for primary care. I spoke with Leah Davies, daughter of  Leah Davies by phone today via [facility] Leah Davies [phone]  What matters to the patients health and wellness today?  Daughter report HD sessions have been going well, denies concern about blood pressure.  State patient has been having periods of apnea during the night, waking up almost gasping for air, and falling asleep during the day. She is requesting a sleep study.  Denies any urgent concerns, encouraged to contact this care manager with questions.      Goals Addressed             This Visit's Progress    Effective management of chronic medical conditions   On track    Interventions Today    Flowsheet Row Most Recent Value  Chronic Disease   Chronic disease during today's visit Hypertension (HTN), Chronic Kidney Disease/End Stage Renal Disease (ESRD), Other  [sleep apnea]  General Interventions   General Interventions Discussed/Reviewed Communication with, General Interventions Reviewed, Doctor Visits, Durable Medical Equipment (DME)  Doctor Visits Discussed/Reviewed Doctor Visits Reviewed, PCP, Specialist  [upcoming with vascular 1/9, daughter requesting sleep study.  HD every MWF]  Durable Medical Equipment (DME) BP Cuff  [Reminded to monitor blood pressure regularly]  PCP/Specialist Visits Compliance with follow-up visit  [PCP done on 11/14]  Communication with PCP/Specialists  [Will send PCP message with request to send referral to sleep clinic]  Education Interventions   Education Provided Provided Education  Provided Verbal Education On When to see the doctor, Medication, Other  [Discussed daughter's concern for sleep apnea, advised of process to have sleep study]              SDOH assessments and interventions completed:   No     Care Coordination Interventions:  Yes, provided   Follow up plan: Follow up call scheduled for 12/20    Encounter Outcome:  Patient Visit Completed   [name], RN, MSN, CCM [place]  [facility], [facility] RN Care Coordinator Direct Dial: [phone] / Main [phone] Fax [phone] Email: [email or web address]: [email or web address]

## 2023-08-25 DIAGNOSIS — N2581 Secondary hyperparathyroidism of renal origin: Secondary | ICD-10-CM | POA: Diagnosis not present

## 2023-08-25 DIAGNOSIS — N186 End stage renal disease: Secondary | ICD-10-CM | POA: Diagnosis not present

## 2023-08-25 DIAGNOSIS — Z992 Dependence on renal dialysis: Secondary | ICD-10-CM | POA: Diagnosis not present

## 2023-08-27 DIAGNOSIS — N2581 Secondary hyperparathyroidism of renal origin: Secondary | ICD-10-CM | POA: Diagnosis not present

## 2023-08-27 DIAGNOSIS — N186 End stage renal disease: Secondary | ICD-10-CM | POA: Diagnosis not present

## 2023-08-27 DIAGNOSIS — Z992 Dependence on renal dialysis: Secondary | ICD-10-CM | POA: Diagnosis not present

## 2023-08-30 DIAGNOSIS — N186 End stage renal disease: Secondary | ICD-10-CM | POA: Diagnosis not present

## 2023-08-30 DIAGNOSIS — I120 Hypertensive chronic kidney disease with stage 5 chronic kidney disease or end stage renal disease: Secondary | ICD-10-CM | POA: Diagnosis not present

## 2023-08-30 DIAGNOSIS — Z992 Dependence on renal dialysis: Secondary | ICD-10-CM | POA: Diagnosis not present

## 2023-09-01 DIAGNOSIS — N186 End stage renal disease: Secondary | ICD-10-CM | POA: Diagnosis not present

## 2023-09-01 DIAGNOSIS — Z992 Dependence on renal dialysis: Secondary | ICD-10-CM | POA: Diagnosis not present

## 2023-09-03 DIAGNOSIS — N186 End stage renal disease: Secondary | ICD-10-CM | POA: Diagnosis not present

## 2023-09-03 DIAGNOSIS — Z992 Dependence on renal dialysis: Secondary | ICD-10-CM | POA: Diagnosis not present

## 2023-09-08 DIAGNOSIS — N186 End stage renal disease: Secondary | ICD-10-CM | POA: Diagnosis not present

## 2023-09-08 DIAGNOSIS — Z992 Dependence on renal dialysis: Secondary | ICD-10-CM | POA: Diagnosis not present

## 2023-09-10 DIAGNOSIS — Z992 Dependence on renal dialysis: Secondary | ICD-10-CM | POA: Diagnosis not present

## 2023-09-10 DIAGNOSIS — N186 End stage renal disease: Secondary | ICD-10-CM | POA: Diagnosis not present

## 2023-09-12 DIAGNOSIS — N186 End stage renal disease: Secondary | ICD-10-CM | POA: Diagnosis not present

## 2023-09-12 DIAGNOSIS — Z992 Dependence on renal dialysis: Secondary | ICD-10-CM | POA: Diagnosis not present

## 2023-09-15 DIAGNOSIS — N186 End stage renal disease: Secondary | ICD-10-CM | POA: Diagnosis not present

## 2023-09-15 DIAGNOSIS — Z992 Dependence on renal dialysis: Secondary | ICD-10-CM | POA: Diagnosis not present

## 2023-09-17 DIAGNOSIS — N186 End stage renal disease: Secondary | ICD-10-CM | POA: Diagnosis not present

## 2023-09-17 DIAGNOSIS — Z992 Dependence on renal dialysis: Secondary | ICD-10-CM | POA: Diagnosis not present

## 2023-09-19 ENCOUNTER — Ambulatory Visit: Payer: Self-pay | Admitting: *Deleted

## 2023-09-19 DIAGNOSIS — N186 End stage renal disease: Secondary | ICD-10-CM | POA: Diagnosis not present

## 2023-09-19 DIAGNOSIS — Z992 Dependence on renal dialysis: Secondary | ICD-10-CM | POA: Diagnosis not present

## 2023-09-19 NOTE — Patient Outreach (Signed)
  Care Coordination   09/19/2023 Name: Leah Davies MRN: [id number] DOB: [birth year]   Care Coordination Outreach Attempts:  An unsuccessful telephone outreach was attempted today to offer the patient information about available complex care management services.  Call via [place], [place] [id number]  Follow Up Plan:  Additional outreach attempts will be made to offer the patient complex care management information and services.   Encounter Outcome:  No Answer   Care Coordination Interventions:  No, not indicated    [name], RN, MSN, CCM [place]  [facility], [facility] RN Care Coordinator Direct Dial: [phone] / Main [phone] Fax [phone] Email: [email or web address]: [email or web address]

## 2023-09-22 DIAGNOSIS — Z992 Dependence on renal dialysis: Secondary | ICD-10-CM | POA: Diagnosis not present

## 2023-09-22 DIAGNOSIS — N186 End stage renal disease: Secondary | ICD-10-CM | POA: Diagnosis not present

## 2023-09-25 DIAGNOSIS — Z992 Dependence on renal dialysis: Secondary | ICD-10-CM | POA: Diagnosis not present

## 2023-09-25 DIAGNOSIS — N186 End stage renal disease: Secondary | ICD-10-CM | POA: Diagnosis not present

## 2023-09-29 DIAGNOSIS — N186 End stage renal disease: Secondary | ICD-10-CM | POA: Diagnosis not present

## 2023-09-29 DIAGNOSIS — Z992 Dependence on renal dialysis: Secondary | ICD-10-CM | POA: Diagnosis not present

## 2023-09-30 DIAGNOSIS — Z992 Dependence on renal dialysis: Secondary | ICD-10-CM | POA: Diagnosis not present

## 2023-09-30 DIAGNOSIS — I120 Hypertensive chronic kidney disease with stage 5 chronic kidney disease or end stage renal disease: Secondary | ICD-10-CM | POA: Diagnosis not present

## 2023-09-30 DIAGNOSIS — N186 End stage renal disease: Secondary | ICD-10-CM | POA: Diagnosis not present

## 2023-10-03 DIAGNOSIS — Z992 Dependence on renal dialysis: Secondary | ICD-10-CM | POA: Diagnosis not present

## 2023-10-03 DIAGNOSIS — N186 End stage renal disease: Secondary | ICD-10-CM | POA: Diagnosis not present

## 2023-10-06 DIAGNOSIS — N186 End stage renal disease: Secondary | ICD-10-CM | POA: Diagnosis not present

## 2023-10-06 DIAGNOSIS — Z992 Dependence on renal dialysis: Secondary | ICD-10-CM | POA: Diagnosis not present

## 2023-10-08 ENCOUNTER — Other Ambulatory Visit (INDEPENDENT_AMBULATORY_CARE_PROVIDER_SITE_OTHER): Payer: Self-pay | Admitting: Nurse Practitioner

## 2023-10-08 DIAGNOSIS — N186 End stage renal disease: Secondary | ICD-10-CM

## 2023-10-08 DIAGNOSIS — Z992 Dependence on renal dialysis: Secondary | ICD-10-CM | POA: Diagnosis not present

## 2023-10-09 ENCOUNTER — Ambulatory Visit (INDEPENDENT_AMBULATORY_CARE_PROVIDER_SITE_OTHER): Payer: Medicare HMO | Admitting: Nurse Practitioner

## 2023-10-09 ENCOUNTER — Encounter (INDEPENDENT_AMBULATORY_CARE_PROVIDER_SITE_OTHER): Payer: Medicare HMO

## 2023-10-09 ENCOUNTER — Telehealth: Payer: Self-pay | Admitting: *Deleted

## 2023-10-09 NOTE — Progress Notes (Signed)
 Complex Care Management Care Guide Note  10/09/2023 Name: Leah Davies MRN: [id number] DOB: 07-Jan-1941  Leah Davies is a 83 y.o. year old female who is a primary care patient of Sherial Bail, MD and is actively engaged with the care management team. I reached out to Leah Davies by phone today to assist with re-scheduling  with the RN Case Manager.  Follow up plan: Unsuccessful telephone outreach attempt made. A HIPAA compliant phone message was left for the patient providing contact information and requesting a return call.  Thedford Franks, CCMA Care Coordination Care Guide Direct Dial: [phone]

## 2023-10-10 DIAGNOSIS — Z992 Dependence on renal dialysis: Secondary | ICD-10-CM | POA: Diagnosis not present

## 2023-10-10 DIAGNOSIS — N186 End stage renal disease: Secondary | ICD-10-CM | POA: Diagnosis not present

## 2023-10-13 NOTE — Progress Notes (Signed)
 Complex Care Management Care Guide Note  10/13/2023 Name: Leah Davies MRN: [id number] DOB: 1941-03-12  Leah Davies is a 83 y.o. year old female who is a primary care patient of Leah Bail, MD and is actively engaged with Leah care management team. I reached out to Leah Davies by phone today to assist with re-scheduling  with Leah Leah Davies.  Follow up plan: Telephone appointment with complex care management team member scheduled for:  10/17/2023  Leah Davies, Leah Davies Care Coordination Care Guide Direct Dial: [phone]

## 2023-10-15 DIAGNOSIS — N186 End stage renal disease: Secondary | ICD-10-CM | POA: Diagnosis not present

## 2023-10-15 DIAGNOSIS — Z992 Dependence on renal dialysis: Secondary | ICD-10-CM | POA: Diagnosis not present

## 2023-10-17 ENCOUNTER — Ambulatory Visit: Payer: Self-pay | Admitting: *Deleted

## 2023-10-17 DIAGNOSIS — Z992 Dependence on renal dialysis: Secondary | ICD-10-CM | POA: Diagnosis not present

## 2023-10-17 DIAGNOSIS — N186 End stage renal disease: Secondary | ICD-10-CM | POA: Diagnosis not present

## 2023-10-17 NOTE — Patient Outreach (Signed)
  Care Coordination   10/17/2023 Name: Leah Davies MRN: [id number] DOB: [birth year]   Care Coordination Outreach Attempts:  An unsuccessful outreach was attempted for an appointment today.  Calls placed to both daughter, Nolberto Hanlon, and patient directly via [employer], ID# [phone]  Follow Up Plan:  Additional outreach attempts will be made to offer the patient complex care management information and services.   Encounter Outcome:  No Answer   Care Coordination Interventions:  No, not indicated    [name], RN, MSN, CCM [place]  [facility], [facility] Health RN Care Coordinator Direct Dial: [phone] / Main [phone] Fax [phone] Email: [email or web address] Website: [email or web address]

## 2023-10-20 DIAGNOSIS — Z992 Dependence on renal dialysis: Secondary | ICD-10-CM | POA: Diagnosis not present

## 2023-10-20 DIAGNOSIS — N186 End stage renal disease: Secondary | ICD-10-CM | POA: Diagnosis not present

## 2023-10-21 DIAGNOSIS — Z0100 Encounter for examination of eyes and vision without abnormal findings: Secondary | ICD-10-CM | POA: Diagnosis not present

## 2023-10-21 DIAGNOSIS — H16223 Keratoconjunctivitis sicca, not specified as Sjogren's, bilateral: Secondary | ICD-10-CM | POA: Diagnosis not present

## 2023-10-21 DIAGNOSIS — H2513 Age-related nuclear cataract, bilateral: Secondary | ICD-10-CM | POA: Diagnosis not present

## 2023-10-27 DIAGNOSIS — N186 End stage renal disease: Secondary | ICD-10-CM | POA: Diagnosis not present

## 2023-10-27 DIAGNOSIS — Z992 Dependence on renal dialysis: Secondary | ICD-10-CM | POA: Diagnosis not present

## 2023-10-28 ENCOUNTER — Telehealth: Payer: Self-pay | Admitting: *Deleted

## 2023-10-28 NOTE — Progress Notes (Signed)
Complex Care Management Care Guide Note  10/28/2023 Name: SHELLENE SWEIGERT MRN: [id number] DOB: 06-Apr-1941  GRACIE GUPTA is a 83 y.o. year old female who is a primary care patient of Enid Baas, MD and is actively engaged with the care management team. I reached out to Cristie Hem by phone today to assist with re-scheduling  with the RN Case Manager.  Follow up plan: pt declined further outreaches at this time   Burman Nieves, CMA, Care Guide [facility]  [facility], [facility] Guide Direct Dial: [phone]  Fax: [phone] Website: [email or web address]

## 2023-10-29 DIAGNOSIS — Z992 Dependence on renal dialysis: Secondary | ICD-10-CM | POA: Diagnosis not present

## 2023-10-29 DIAGNOSIS — N186 End stage renal disease: Secondary | ICD-10-CM | POA: Diagnosis not present

## 2023-10-31 DIAGNOSIS — I120 Hypertensive chronic kidney disease with stage 5 chronic kidney disease or end stage renal disease: Secondary | ICD-10-CM | POA: Diagnosis not present

## 2023-10-31 DIAGNOSIS — N186 End stage renal disease: Secondary | ICD-10-CM | POA: Diagnosis not present

## 2023-10-31 DIAGNOSIS — Z992 Dependence on renal dialysis: Secondary | ICD-10-CM | POA: Diagnosis not present

## 2023-11-03 ENCOUNTER — Encounter: Payer: Self-pay | Admitting: *Deleted

## 2023-11-03 DIAGNOSIS — N186 End stage renal disease: Secondary | ICD-10-CM | POA: Diagnosis not present

## 2023-11-03 DIAGNOSIS — Z992 Dependence on renal dialysis: Secondary | ICD-10-CM | POA: Diagnosis not present

## 2023-11-03 NOTE — Patient Outreach (Signed)
  Care Coordination   Follow Up Visit Note   11/03/2023 Name: Leah Davies MRN: [id number] DOB: 08-02-1941  Leah Davies is a 83 y.o. year old female who sees Enid Baas, MD for primary care.   Patient/family declined further participation.    Goals Addressed             This Visit's Progress    COMPLETED: Effective management of chronic medical conditions       Interventions Today    Flowsheet Row Most Recent Value  Chronic Disease   Chronic disease during today's visit Hypertension (HTN), Chronic Kidney Disease/End Stage Renal Disease (ESRD), Other  [sleep apnea]  General Interventions   General Interventions Discussed/Reviewed Communication with, General Interventions Reviewed, Doctor Visits, Durable Medical Equipment (DME)  Doctor Visits Discussed/Reviewed Doctor Visits Reviewed, PCP, Specialist  [upcoming with vascular 1/9, daughter requesting sleep study.  HD every MWF]  Durable Medical Equipment (DME) BP Cuff  [Reminded to monitor blood pressure regularly]  PCP/Specialist Visits Compliance with follow-up visit  [PCP done on 11/14]  Communication with PCP/Specialists  [Will send PCP message with request to send referral to sleep clinic]  Education Interventions   Education Provided Provided Education  Provided Verbal Education On When to see the doctor, Medication, Other  [Discussed daughter's concern for sleep apnea, advised of process to have sleep study]              SDOH assessments and interventions completed:  No     Care Coordination Interventions:  No, not indicated   Follow up plan: No further intervention required.   Encounter Outcome:  Patient Visit Completed   [name], RN, MSN, CCM [place]  [facility], [facility] RN Care Coordinator Direct Dial: [phone] / Main [phone] Fax [phone] Email: [email or web address] Website: [email or web address]

## 2023-11-05 DIAGNOSIS — N186 End stage renal disease: Secondary | ICD-10-CM | POA: Diagnosis not present

## 2023-11-05 DIAGNOSIS — Z992 Dependence on renal dialysis: Secondary | ICD-10-CM | POA: Diagnosis not present

## 2023-11-07 DIAGNOSIS — N186 End stage renal disease: Secondary | ICD-10-CM | POA: Diagnosis not present

## 2023-11-07 DIAGNOSIS — Z992 Dependence on renal dialysis: Secondary | ICD-10-CM | POA: Diagnosis not present

## 2023-11-10 DIAGNOSIS — Z992 Dependence on renal dialysis: Secondary | ICD-10-CM | POA: Diagnosis not present

## 2023-11-10 DIAGNOSIS — N186 End stage renal disease: Secondary | ICD-10-CM | POA: Diagnosis not present

## 2023-11-12 DIAGNOSIS — Z992 Dependence on renal dialysis: Secondary | ICD-10-CM | POA: Diagnosis not present

## 2023-11-12 DIAGNOSIS — N186 End stage renal disease: Secondary | ICD-10-CM | POA: Diagnosis not present

## 2023-11-14 DIAGNOSIS — G4733 Obstructive sleep apnea (adult) (pediatric): Secondary | ICD-10-CM | POA: Diagnosis not present

## 2023-11-17 DIAGNOSIS — N186 End stage renal disease: Secondary | ICD-10-CM | POA: Diagnosis not present

## 2023-11-17 DIAGNOSIS — Z992 Dependence on renal dialysis: Secondary | ICD-10-CM | POA: Diagnosis not present

## 2023-11-21 DIAGNOSIS — Z992 Dependence on renal dialysis: Secondary | ICD-10-CM | POA: Diagnosis not present

## 2023-11-21 DIAGNOSIS — N186 End stage renal disease: Secondary | ICD-10-CM | POA: Diagnosis not present

## 2023-11-24 DIAGNOSIS — Z992 Dependence on renal dialysis: Secondary | ICD-10-CM | POA: Diagnosis not present

## 2023-11-24 DIAGNOSIS — N186 End stage renal disease: Secondary | ICD-10-CM | POA: Diagnosis not present

## 2023-11-28 DIAGNOSIS — I120 Hypertensive chronic kidney disease with stage 5 chronic kidney disease or end stage renal disease: Secondary | ICD-10-CM | POA: Diagnosis not present

## 2023-11-28 DIAGNOSIS — Z992 Dependence on renal dialysis: Secondary | ICD-10-CM | POA: Diagnosis not present

## 2023-11-28 DIAGNOSIS — N186 End stage renal disease: Secondary | ICD-10-CM | POA: Diagnosis not present

## 2023-12-01 DIAGNOSIS — N186 End stage renal disease: Secondary | ICD-10-CM | POA: Diagnosis not present

## 2023-12-01 DIAGNOSIS — Z992 Dependence on renal dialysis: Secondary | ICD-10-CM | POA: Diagnosis not present

## 2023-12-03 DIAGNOSIS — N186 End stage renal disease: Secondary | ICD-10-CM | POA: Diagnosis not present

## 2023-12-03 DIAGNOSIS — Z992 Dependence on renal dialysis: Secondary | ICD-10-CM | POA: Diagnosis not present

## 2023-12-08 DIAGNOSIS — N186 End stage renal disease: Secondary | ICD-10-CM | POA: Diagnosis not present

## 2023-12-08 DIAGNOSIS — Z992 Dependence on renal dialysis: Secondary | ICD-10-CM | POA: Diagnosis not present

## 2023-12-10 DIAGNOSIS — Z992 Dependence on renal dialysis: Secondary | ICD-10-CM | POA: Diagnosis not present

## 2023-12-10 DIAGNOSIS — N186 End stage renal disease: Secondary | ICD-10-CM | POA: Diagnosis not present

## 2023-12-15 DIAGNOSIS — N186 End stage renal disease: Secondary | ICD-10-CM | POA: Diagnosis not present

## 2023-12-15 DIAGNOSIS — Z992 Dependence on renal dialysis: Secondary | ICD-10-CM | POA: Diagnosis not present

## 2023-12-17 DIAGNOSIS — N186 End stage renal disease: Secondary | ICD-10-CM | POA: Diagnosis not present

## 2023-12-17 DIAGNOSIS — Z992 Dependence on renal dialysis: Secondary | ICD-10-CM | POA: Diagnosis not present

## 2023-12-22 DIAGNOSIS — Z992 Dependence on renal dialysis: Secondary | ICD-10-CM | POA: Diagnosis not present

## 2023-12-22 DIAGNOSIS — N186 End stage renal disease: Secondary | ICD-10-CM | POA: Diagnosis not present

## 2023-12-23 DIAGNOSIS — H1013 Acute atopic conjunctivitis, bilateral: Secondary | ICD-10-CM | POA: Diagnosis not present

## 2023-12-23 DIAGNOSIS — B880 Other acariasis: Secondary | ICD-10-CM | POA: Diagnosis not present

## 2023-12-23 DIAGNOSIS — H01003 Unspecified blepharitis right eye, unspecified eyelid: Secondary | ICD-10-CM | POA: Diagnosis not present

## 2023-12-24 DIAGNOSIS — Z992 Dependence on renal dialysis: Secondary | ICD-10-CM | POA: Diagnosis not present

## 2023-12-24 DIAGNOSIS — N186 End stage renal disease: Secondary | ICD-10-CM | POA: Diagnosis not present

## 2023-12-25 DIAGNOSIS — Z992 Dependence on renal dialysis: Secondary | ICD-10-CM | POA: Diagnosis not present

## 2023-12-25 DIAGNOSIS — N186 End stage renal disease: Secondary | ICD-10-CM | POA: Diagnosis not present

## 2023-12-25 DIAGNOSIS — I120 Hypertensive chronic kidney disease with stage 5 chronic kidney disease or end stage renal disease: Secondary | ICD-10-CM | POA: Diagnosis not present

## 2023-12-25 DIAGNOSIS — M533 Sacrococcygeal disorders, not elsewhere classified: Secondary | ICD-10-CM | POA: Diagnosis not present

## 2023-12-25 DIAGNOSIS — W108XXA Fall (on) (from) other stairs and steps, initial encounter: Secondary | ICD-10-CM | POA: Diagnosis not present

## 2023-12-25 DIAGNOSIS — Y92009 Unspecified place in unspecified non-institutional (private) residence as the place of occurrence of the external cause: Secondary | ICD-10-CM | POA: Diagnosis not present

## 2023-12-26 DIAGNOSIS — Z992 Dependence on renal dialysis: Secondary | ICD-10-CM | POA: Diagnosis not present

## 2023-12-26 DIAGNOSIS — N186 End stage renal disease: Secondary | ICD-10-CM | POA: Diagnosis not present

## 2023-12-29 DIAGNOSIS — N186 End stage renal disease: Secondary | ICD-10-CM | POA: Diagnosis not present

## 2023-12-29 DIAGNOSIS — I120 Hypertensive chronic kidney disease with stage 5 chronic kidney disease or end stage renal disease: Secondary | ICD-10-CM | POA: Diagnosis not present

## 2023-12-29 DIAGNOSIS — Z992 Dependence on renal dialysis: Secondary | ICD-10-CM | POA: Diagnosis not present

## 2023-12-31 DIAGNOSIS — N186 End stage renal disease: Secondary | ICD-10-CM | POA: Diagnosis not present

## 2023-12-31 DIAGNOSIS — Z992 Dependence on renal dialysis: Secondary | ICD-10-CM | POA: Diagnosis not present

## 2024-01-05 DIAGNOSIS — Z992 Dependence on renal dialysis: Secondary | ICD-10-CM | POA: Diagnosis not present

## 2024-01-05 DIAGNOSIS — N186 End stage renal disease: Secondary | ICD-10-CM | POA: Diagnosis not present

## 2024-01-07 DIAGNOSIS — N186 End stage renal disease: Secondary | ICD-10-CM | POA: Diagnosis not present

## 2024-01-07 DIAGNOSIS — Z992 Dependence on renal dialysis: Secondary | ICD-10-CM | POA: Diagnosis not present

## 2024-01-12 DIAGNOSIS — Z992 Dependence on renal dialysis: Secondary | ICD-10-CM | POA: Diagnosis not present

## 2024-01-12 DIAGNOSIS — N186 End stage renal disease: Secondary | ICD-10-CM | POA: Diagnosis not present

## 2024-01-13 DIAGNOSIS — B880 Other acariasis: Secondary | ICD-10-CM | POA: Diagnosis not present

## 2024-01-14 DIAGNOSIS — N186 End stage renal disease: Secondary | ICD-10-CM | POA: Diagnosis not present

## 2024-01-14 DIAGNOSIS — Z992 Dependence on renal dialysis: Secondary | ICD-10-CM | POA: Diagnosis not present

## 2024-01-19 DIAGNOSIS — Z992 Dependence on renal dialysis: Secondary | ICD-10-CM | POA: Diagnosis not present

## 2024-01-19 DIAGNOSIS — N186 End stage renal disease: Secondary | ICD-10-CM | POA: Diagnosis not present

## 2024-01-21 DIAGNOSIS — Z992 Dependence on renal dialysis: Secondary | ICD-10-CM | POA: Diagnosis not present

## 2024-01-21 DIAGNOSIS — N186 End stage renal disease: Secondary | ICD-10-CM | POA: Diagnosis not present

## 2024-01-26 DIAGNOSIS — Z992 Dependence on renal dialysis: Secondary | ICD-10-CM | POA: Diagnosis not present

## 2024-01-26 DIAGNOSIS — N186 End stage renal disease: Secondary | ICD-10-CM | POA: Diagnosis not present

## 2024-01-28 DIAGNOSIS — Z992 Dependence on renal dialysis: Secondary | ICD-10-CM | POA: Diagnosis not present

## 2024-01-28 DIAGNOSIS — N186 End stage renal disease: Secondary | ICD-10-CM | POA: Diagnosis not present

## 2024-01-28 DIAGNOSIS — I120 Hypertensive chronic kidney disease with stage 5 chronic kidney disease or end stage renal disease: Secondary | ICD-10-CM | POA: Diagnosis not present

## 2024-01-29 DIAGNOSIS — N186 End stage renal disease: Secondary | ICD-10-CM | POA: Diagnosis not present

## 2024-01-29 DIAGNOSIS — Z992 Dependence on renal dialysis: Secondary | ICD-10-CM | POA: Diagnosis not present

## 2024-02-02 DIAGNOSIS — N186 End stage renal disease: Secondary | ICD-10-CM | POA: Diagnosis not present

## 2024-02-02 DIAGNOSIS — Z992 Dependence on renal dialysis: Secondary | ICD-10-CM | POA: Diagnosis not present

## 2024-02-04 DIAGNOSIS — Z992 Dependence on renal dialysis: Secondary | ICD-10-CM | POA: Diagnosis not present

## 2024-02-04 DIAGNOSIS — N186 End stage renal disease: Secondary | ICD-10-CM | POA: Diagnosis not present

## 2024-02-09 DIAGNOSIS — N186 End stage renal disease: Secondary | ICD-10-CM | POA: Diagnosis not present

## 2024-02-09 DIAGNOSIS — Z992 Dependence on renal dialysis: Secondary | ICD-10-CM | POA: Diagnosis not present

## 2024-02-11 DIAGNOSIS — N186 End stage renal disease: Secondary | ICD-10-CM | POA: Diagnosis not present

## 2024-02-11 DIAGNOSIS — Z992 Dependence on renal dialysis: Secondary | ICD-10-CM | POA: Diagnosis not present

## 2024-02-18 DIAGNOSIS — N186 End stage renal disease: Secondary | ICD-10-CM | POA: Diagnosis not present

## 2024-02-18 DIAGNOSIS — Z992 Dependence on renal dialysis: Secondary | ICD-10-CM | POA: Diagnosis not present

## 2024-02-20 DIAGNOSIS — N186 End stage renal disease: Secondary | ICD-10-CM | POA: Diagnosis not present

## 2024-02-20 DIAGNOSIS — Z992 Dependence on renal dialysis: Secondary | ICD-10-CM | POA: Diagnosis not present

## 2024-02-23 DIAGNOSIS — Z992 Dependence on renal dialysis: Secondary | ICD-10-CM | POA: Diagnosis not present

## 2024-02-23 DIAGNOSIS — N186 End stage renal disease: Secondary | ICD-10-CM | POA: Diagnosis not present

## 2024-02-24 DIAGNOSIS — N186 End stage renal disease: Secondary | ICD-10-CM | POA: Diagnosis not present

## 2024-02-24 DIAGNOSIS — R5383 Other fatigue: Secondary | ICD-10-CM | POA: Diagnosis not present

## 2024-02-24 DIAGNOSIS — G4733 Obstructive sleep apnea (adult) (pediatric): Secondary | ICD-10-CM | POA: Diagnosis not present

## 2024-02-24 DIAGNOSIS — I120 Hypertensive chronic kidney disease with stage 5 chronic kidney disease or end stage renal disease: Secondary | ICD-10-CM | POA: Diagnosis not present

## 2024-02-25 DIAGNOSIS — N186 End stage renal disease: Secondary | ICD-10-CM | POA: Diagnosis not present

## 2024-02-25 DIAGNOSIS — Z992 Dependence on renal dialysis: Secondary | ICD-10-CM | POA: Diagnosis not present

## 2024-02-28 DIAGNOSIS — N186 End stage renal disease: Secondary | ICD-10-CM | POA: Diagnosis not present

## 2024-02-28 DIAGNOSIS — I120 Hypertensive chronic kidney disease with stage 5 chronic kidney disease or end stage renal disease: Secondary | ICD-10-CM | POA: Diagnosis not present

## 2024-02-28 DIAGNOSIS — Z992 Dependence on renal dialysis: Secondary | ICD-10-CM | POA: Diagnosis not present

## 2024-03-01 DIAGNOSIS — N186 End stage renal disease: Secondary | ICD-10-CM | POA: Diagnosis not present

## 2024-03-01 DIAGNOSIS — Z992 Dependence on renal dialysis: Secondary | ICD-10-CM | POA: Diagnosis not present

## 2024-03-03 DIAGNOSIS — Z992 Dependence on renal dialysis: Secondary | ICD-10-CM | POA: Diagnosis not present

## 2024-03-03 DIAGNOSIS — N186 End stage renal disease: Secondary | ICD-10-CM | POA: Diagnosis not present

## 2024-03-05 DIAGNOSIS — N186 End stage renal disease: Secondary | ICD-10-CM | POA: Diagnosis not present

## 2024-03-05 DIAGNOSIS — Z992 Dependence on renal dialysis: Secondary | ICD-10-CM | POA: Diagnosis not present

## 2024-03-08 DIAGNOSIS — Z992 Dependence on renal dialysis: Secondary | ICD-10-CM | POA: Diagnosis not present

## 2024-03-08 DIAGNOSIS — N186 End stage renal disease: Secondary | ICD-10-CM | POA: Diagnosis not present

## 2024-03-10 DIAGNOSIS — Z992 Dependence on renal dialysis: Secondary | ICD-10-CM | POA: Diagnosis not present

## 2024-03-10 DIAGNOSIS — N186 End stage renal disease: Secondary | ICD-10-CM | POA: Diagnosis not present

## 2024-03-12 DIAGNOSIS — N186 End stage renal disease: Secondary | ICD-10-CM | POA: Diagnosis not present

## 2024-03-12 DIAGNOSIS — Z992 Dependence on renal dialysis: Secondary | ICD-10-CM | POA: Diagnosis not present

## 2024-03-15 DIAGNOSIS — Z992 Dependence on renal dialysis: Secondary | ICD-10-CM | POA: Diagnosis not present

## 2024-03-15 DIAGNOSIS — N186 End stage renal disease: Secondary | ICD-10-CM | POA: Diagnosis not present

## 2024-03-17 DIAGNOSIS — Z992 Dependence on renal dialysis: Secondary | ICD-10-CM | POA: Diagnosis not present

## 2024-03-17 DIAGNOSIS — N186 End stage renal disease: Secondary | ICD-10-CM | POA: Diagnosis not present

## 2024-03-19 DIAGNOSIS — N186 End stage renal disease: Secondary | ICD-10-CM | POA: Diagnosis not present

## 2024-03-19 DIAGNOSIS — Z992 Dependence on renal dialysis: Secondary | ICD-10-CM | POA: Diagnosis not present

## 2024-03-21 DIAGNOSIS — Z992 Dependence on renal dialysis: Secondary | ICD-10-CM | POA: Diagnosis not present

## 2024-03-21 DIAGNOSIS — N186 End stage renal disease: Secondary | ICD-10-CM | POA: Diagnosis not present

## 2024-03-22 DIAGNOSIS — Z992 Dependence on renal dialysis: Secondary | ICD-10-CM | POA: Diagnosis not present

## 2024-03-22 DIAGNOSIS — N186 End stage renal disease: Secondary | ICD-10-CM | POA: Diagnosis not present

## 2024-03-24 DIAGNOSIS — Z992 Dependence on renal dialysis: Secondary | ICD-10-CM | POA: Diagnosis not present

## 2024-03-24 DIAGNOSIS — N186 End stage renal disease: Secondary | ICD-10-CM | POA: Diagnosis not present

## 2024-03-26 DIAGNOSIS — Z992 Dependence on renal dialysis: Secondary | ICD-10-CM | POA: Diagnosis not present

## 2024-03-26 DIAGNOSIS — N186 End stage renal disease: Secondary | ICD-10-CM | POA: Diagnosis not present

## 2024-03-29 DIAGNOSIS — N186 End stage renal disease: Secondary | ICD-10-CM | POA: Diagnosis not present

## 2024-03-29 DIAGNOSIS — I120 Hypertensive chronic kidney disease with stage 5 chronic kidney disease or end stage renal disease: Secondary | ICD-10-CM | POA: Diagnosis not present

## 2024-03-29 DIAGNOSIS — Z992 Dependence on renal dialysis: Secondary | ICD-10-CM | POA: Diagnosis not present

## 2024-03-30 DIAGNOSIS — N186 End stage renal disease: Secondary | ICD-10-CM | POA: Diagnosis not present

## 2024-03-30 DIAGNOSIS — Z992 Dependence on renal dialysis: Secondary | ICD-10-CM | POA: Diagnosis not present

## 2024-03-31 DIAGNOSIS — Z992 Dependence on renal dialysis: Secondary | ICD-10-CM | POA: Diagnosis not present

## 2024-03-31 DIAGNOSIS — N186 End stage renal disease: Secondary | ICD-10-CM | POA: Diagnosis not present

## 2024-04-02 DIAGNOSIS — N186 End stage renal disease: Secondary | ICD-10-CM | POA: Diagnosis not present

## 2024-04-02 DIAGNOSIS — Z992 Dependence on renal dialysis: Secondary | ICD-10-CM | POA: Diagnosis not present

## 2024-04-05 DIAGNOSIS — Z992 Dependence on renal dialysis: Secondary | ICD-10-CM | POA: Diagnosis not present

## 2024-04-05 DIAGNOSIS — N186 End stage renal disease: Secondary | ICD-10-CM | POA: Diagnosis not present

## 2024-04-09 DIAGNOSIS — N186 End stage renal disease: Secondary | ICD-10-CM | POA: Diagnosis not present

## 2024-04-09 DIAGNOSIS — Z992 Dependence on renal dialysis: Secondary | ICD-10-CM | POA: Diagnosis not present

## 2024-04-14 DIAGNOSIS — N186 End stage renal disease: Secondary | ICD-10-CM | POA: Diagnosis not present

## 2024-04-14 DIAGNOSIS — Z992 Dependence on renal dialysis: Secondary | ICD-10-CM | POA: Diagnosis not present

## 2024-04-16 DIAGNOSIS — N186 End stage renal disease: Secondary | ICD-10-CM | POA: Diagnosis not present

## 2024-04-16 DIAGNOSIS — Z992 Dependence on renal dialysis: Secondary | ICD-10-CM | POA: Diagnosis not present

## 2024-04-19 DIAGNOSIS — Z992 Dependence on renal dialysis: Secondary | ICD-10-CM | POA: Diagnosis not present

## 2024-04-19 DIAGNOSIS — N186 End stage renal disease: Secondary | ICD-10-CM | POA: Diagnosis not present

## 2024-04-20 DIAGNOSIS — R42 Dizziness and giddiness: Secondary | ICD-10-CM | POA: Diagnosis not present

## 2024-04-20 DIAGNOSIS — Z992 Dependence on renal dialysis: Secondary | ICD-10-CM | POA: Diagnosis not present

## 2024-04-20 DIAGNOSIS — N186 End stage renal disease: Secondary | ICD-10-CM | POA: Diagnosis not present

## 2024-04-20 DIAGNOSIS — G4733 Obstructive sleep apnea (adult) (pediatric): Secondary | ICD-10-CM | POA: Diagnosis not present

## 2024-04-20 DIAGNOSIS — I120 Hypertensive chronic kidney disease with stage 5 chronic kidney disease or end stage renal disease: Secondary | ICD-10-CM | POA: Diagnosis not present

## 2024-04-21 DIAGNOSIS — N186 End stage renal disease: Secondary | ICD-10-CM | POA: Diagnosis not present

## 2024-04-21 DIAGNOSIS — Z992 Dependence on renal dialysis: Secondary | ICD-10-CM | POA: Diagnosis not present

## 2024-04-23 DIAGNOSIS — N186 End stage renal disease: Secondary | ICD-10-CM | POA: Diagnosis not present

## 2024-04-23 DIAGNOSIS — Z992 Dependence on renal dialysis: Secondary | ICD-10-CM | POA: Diagnosis not present

## 2024-04-26 DIAGNOSIS — Z992 Dependence on renal dialysis: Secondary | ICD-10-CM | POA: Diagnosis not present

## 2024-04-26 DIAGNOSIS — N186 End stage renal disease: Secondary | ICD-10-CM | POA: Diagnosis not present

## 2024-04-28 DIAGNOSIS — Z992 Dependence on renal dialysis: Secondary | ICD-10-CM | POA: Diagnosis not present

## 2024-04-28 DIAGNOSIS — N186 End stage renal disease: Secondary | ICD-10-CM | POA: Diagnosis not present

## 2024-04-29 DIAGNOSIS — N186 End stage renal disease: Secondary | ICD-10-CM | POA: Diagnosis not present

## 2024-04-29 DIAGNOSIS — Z992 Dependence on renal dialysis: Secondary | ICD-10-CM | POA: Diagnosis not present

## 2024-04-29 DIAGNOSIS — I120 Hypertensive chronic kidney disease with stage 5 chronic kidney disease or end stage renal disease: Secondary | ICD-10-CM | POA: Diagnosis not present

## 2024-04-30 DIAGNOSIS — N186 End stage renal disease: Secondary | ICD-10-CM | POA: Diagnosis not present

## 2024-04-30 DIAGNOSIS — Z992 Dependence on renal dialysis: Secondary | ICD-10-CM | POA: Diagnosis not present

## 2024-05-03 DIAGNOSIS — Z992 Dependence on renal dialysis: Secondary | ICD-10-CM | POA: Diagnosis not present

## 2024-05-03 DIAGNOSIS — N186 End stage renal disease: Secondary | ICD-10-CM | POA: Diagnosis not present

## 2024-05-05 DIAGNOSIS — N186 End stage renal disease: Secondary | ICD-10-CM | POA: Diagnosis not present

## 2024-05-05 DIAGNOSIS — Z992 Dependence on renal dialysis: Secondary | ICD-10-CM | POA: Diagnosis not present

## 2024-05-10 DIAGNOSIS — N186 End stage renal disease: Secondary | ICD-10-CM | POA: Diagnosis not present

## 2024-05-10 DIAGNOSIS — Z992 Dependence on renal dialysis: Secondary | ICD-10-CM | POA: Diagnosis not present

## 2024-05-12 DIAGNOSIS — N186 End stage renal disease: Secondary | ICD-10-CM | POA: Diagnosis not present

## 2024-05-12 DIAGNOSIS — Z992 Dependence on renal dialysis: Secondary | ICD-10-CM | POA: Diagnosis not present

## 2024-05-14 DIAGNOSIS — Z992 Dependence on renal dialysis: Secondary | ICD-10-CM | POA: Diagnosis not present

## 2024-05-14 DIAGNOSIS — N186 End stage renal disease: Secondary | ICD-10-CM | POA: Diagnosis not present

## 2024-05-17 DIAGNOSIS — Z992 Dependence on renal dialysis: Secondary | ICD-10-CM | POA: Diagnosis not present

## 2024-05-17 DIAGNOSIS — N186 End stage renal disease: Secondary | ICD-10-CM | POA: Diagnosis not present

## 2024-05-19 DIAGNOSIS — N186 End stage renal disease: Secondary | ICD-10-CM | POA: Diagnosis not present

## 2024-05-19 DIAGNOSIS — Z992 Dependence on renal dialysis: Secondary | ICD-10-CM | POA: Diagnosis not present

## 2024-05-24 DIAGNOSIS — N186 End stage renal disease: Secondary | ICD-10-CM | POA: Diagnosis not present

## 2024-05-24 DIAGNOSIS — Z992 Dependence on renal dialysis: Secondary | ICD-10-CM | POA: Diagnosis not present

## 2024-05-26 DIAGNOSIS — Z992 Dependence on renal dialysis: Secondary | ICD-10-CM | POA: Diagnosis not present

## 2024-05-26 DIAGNOSIS — N186 End stage renal disease: Secondary | ICD-10-CM | POA: Diagnosis not present

## 2024-05-28 DIAGNOSIS — N186 End stage renal disease: Secondary | ICD-10-CM | POA: Diagnosis not present

## 2024-05-28 DIAGNOSIS — Z992 Dependence on renal dialysis: Secondary | ICD-10-CM | POA: Diagnosis not present

## 2024-05-30 DIAGNOSIS — I120 Hypertensive chronic kidney disease with stage 5 chronic kidney disease or end stage renal disease: Secondary | ICD-10-CM | POA: Diagnosis not present

## 2024-05-30 DIAGNOSIS — Z992 Dependence on renal dialysis: Secondary | ICD-10-CM | POA: Diagnosis not present

## 2024-05-30 DIAGNOSIS — N186 End stage renal disease: Secondary | ICD-10-CM | POA: Diagnosis not present

## 2024-05-31 DIAGNOSIS — N186 End stage renal disease: Secondary | ICD-10-CM | POA: Diagnosis not present

## 2024-05-31 DIAGNOSIS — Z992 Dependence on renal dialysis: Secondary | ICD-10-CM | POA: Diagnosis not present

## 2024-06-04 DIAGNOSIS — N186 End stage renal disease: Secondary | ICD-10-CM | POA: Diagnosis not present

## 2024-06-09 DIAGNOSIS — N186 End stage renal disease: Secondary | ICD-10-CM | POA: Diagnosis not present

## 2024-06-09 DIAGNOSIS — Z992 Dependence on renal dialysis: Secondary | ICD-10-CM | POA: Diagnosis not present

## 2024-06-14 DIAGNOSIS — Z992 Dependence on renal dialysis: Secondary | ICD-10-CM | POA: Diagnosis not present

## 2024-06-16 DIAGNOSIS — N186 End stage renal disease: Secondary | ICD-10-CM | POA: Diagnosis not present

## 2024-06-23 DIAGNOSIS — N186 End stage renal disease: Secondary | ICD-10-CM | POA: Diagnosis not present

## 2024-06-23 DIAGNOSIS — Z992 Dependence on renal dialysis: Secondary | ICD-10-CM | POA: Diagnosis not present

## 2024-06-29 DIAGNOSIS — Z992 Dependence on renal dialysis: Secondary | ICD-10-CM | POA: Diagnosis not present

## 2024-06-29 DIAGNOSIS — I120 Hypertensive chronic kidney disease with stage 5 chronic kidney disease or end stage renal disease: Secondary | ICD-10-CM | POA: Diagnosis not present

## 2024-06-29 DIAGNOSIS — N186 End stage renal disease: Secondary | ICD-10-CM | POA: Diagnosis not present

## 2024-06-30 DIAGNOSIS — N186 End stage renal disease: Secondary | ICD-10-CM | POA: Diagnosis not present

## 2024-06-30 DIAGNOSIS — Z992 Dependence on renal dialysis: Secondary | ICD-10-CM | POA: Diagnosis not present

## 2024-07-02 DIAGNOSIS — N186 End stage renal disease: Secondary | ICD-10-CM | POA: Diagnosis not present

## 2024-07-02 DIAGNOSIS — Z992 Dependence on renal dialysis: Secondary | ICD-10-CM | POA: Diagnosis not present

## 2024-07-05 DIAGNOSIS — N186 End stage renal disease: Secondary | ICD-10-CM | POA: Diagnosis not present

## 2024-07-05 DIAGNOSIS — Z992 Dependence on renal dialysis: Secondary | ICD-10-CM | POA: Diagnosis not present

## 2024-07-07 DIAGNOSIS — N186 End stage renal disease: Secondary | ICD-10-CM | POA: Diagnosis not present

## 2024-07-08 DIAGNOSIS — M25551 Pain in right hip: Secondary | ICD-10-CM | POA: Diagnosis not present

## 2024-07-08 DIAGNOSIS — Z0000 Encounter for general adult medical examination without abnormal findings: Secondary | ICD-10-CM | POA: Diagnosis not present

## 2024-07-08 DIAGNOSIS — Z992 Dependence on renal dialysis: Secondary | ICD-10-CM | POA: Diagnosis not present

## 2024-07-08 DIAGNOSIS — I8312 Varicose veins of left lower extremity with inflammation: Secondary | ICD-10-CM | POA: Diagnosis not present

## 2024-07-08 DIAGNOSIS — I120 Hypertensive chronic kidney disease with stage 5 chronic kidney disease or end stage renal disease: Secondary | ICD-10-CM | POA: Diagnosis not present

## 2024-07-08 DIAGNOSIS — N2581 Secondary hyperparathyroidism of renal origin: Secondary | ICD-10-CM | POA: Diagnosis not present

## 2024-07-08 DIAGNOSIS — N186 End stage renal disease: Secondary | ICD-10-CM | POA: Diagnosis not present

## 2024-07-08 DIAGNOSIS — Z1331 Encounter for screening for depression: Secondary | ICD-10-CM | POA: Diagnosis not present

## 2024-07-08 DIAGNOSIS — G8929 Other chronic pain: Secondary | ICD-10-CM | POA: Diagnosis not present

## 2024-07-08 DIAGNOSIS — M5441 Lumbago with sciatica, right side: Secondary | ICD-10-CM | POA: Diagnosis not present

## 2024-07-14 DIAGNOSIS — Z992 Dependence on renal dialysis: Secondary | ICD-10-CM | POA: Diagnosis not present

## 2024-07-16 DIAGNOSIS — Z992 Dependence on renal dialysis: Secondary | ICD-10-CM | POA: Diagnosis not present

## 2024-07-16 DIAGNOSIS — N186 End stage renal disease: Secondary | ICD-10-CM | POA: Diagnosis not present

## 2024-07-19 DIAGNOSIS — Z992 Dependence on renal dialysis: Secondary | ICD-10-CM | POA: Diagnosis not present

## 2024-07-21 DIAGNOSIS — Z992 Dependence on renal dialysis: Secondary | ICD-10-CM | POA: Diagnosis not present

## 2024-07-26 DIAGNOSIS — Z992 Dependence on renal dialysis: Secondary | ICD-10-CM | POA: Diagnosis not present

## 2024-07-30 DIAGNOSIS — I120 Hypertensive chronic kidney disease with stage 5 chronic kidney disease or end stage renal disease: Secondary | ICD-10-CM | POA: Diagnosis not present

## 2024-07-30 DIAGNOSIS — N186 End stage renal disease: Secondary | ICD-10-CM | POA: Diagnosis not present

## 2024-07-30 DIAGNOSIS — Z992 Dependence on renal dialysis: Secondary | ICD-10-CM | POA: Diagnosis not present

## 2024-08-02 DIAGNOSIS — N186 End stage renal disease: Secondary | ICD-10-CM | POA: Diagnosis not present

## 2024-08-02 DIAGNOSIS — Z992 Dependence on renal dialysis: Secondary | ICD-10-CM | POA: Diagnosis not present

## 2024-08-04 DIAGNOSIS — Z992 Dependence on renal dialysis: Secondary | ICD-10-CM | POA: Diagnosis not present

## 2024-08-04 DIAGNOSIS — N186 End stage renal disease: Secondary | ICD-10-CM | POA: Diagnosis not present

## 2024-08-06 DIAGNOSIS — N186 End stage renal disease: Secondary | ICD-10-CM | POA: Diagnosis not present

## 2024-08-09 DIAGNOSIS — N186 End stage renal disease: Secondary | ICD-10-CM | POA: Diagnosis not present

## 2024-08-16 DIAGNOSIS — N186 End stage renal disease: Secondary | ICD-10-CM | POA: Diagnosis not present

## 2024-08-19 ENCOUNTER — Other Ambulatory Visit (INDEPENDENT_AMBULATORY_CARE_PROVIDER_SITE_OTHER): Payer: Self-pay | Admitting: Nurse Practitioner

## 2024-08-19 DIAGNOSIS — I8312 Varicose veins of left lower extremity with inflammation: Secondary | ICD-10-CM

## 2024-08-23 DIAGNOSIS — Z992 Dependence on renal dialysis: Secondary | ICD-10-CM | POA: Diagnosis not present

## 2024-08-23 DIAGNOSIS — N186 End stage renal disease: Secondary | ICD-10-CM | POA: Diagnosis not present

## 2024-08-24 ENCOUNTER — Encounter (INDEPENDENT_AMBULATORY_CARE_PROVIDER_SITE_OTHER)

## 2024-08-24 ENCOUNTER — Ambulatory Visit (INDEPENDENT_AMBULATORY_CARE_PROVIDER_SITE_OTHER): Admitting: Nurse Practitioner

## 2024-08-24 DIAGNOSIS — H538 Other visual disturbances: Secondary | ICD-10-CM | POA: Diagnosis not present

## 2024-08-24 DIAGNOSIS — H02833 Dermatochalasis of right eye, unspecified eyelid: Secondary | ICD-10-CM | POA: Diagnosis not present

## 2024-08-24 DIAGNOSIS — H2513 Age-related nuclear cataract, bilateral: Secondary | ICD-10-CM | POA: Diagnosis not present

## 2024-08-25 DIAGNOSIS — Z992 Dependence on renal dialysis: Secondary | ICD-10-CM | POA: Diagnosis not present

## 2024-08-25 DIAGNOSIS — N186 End stage renal disease: Secondary | ICD-10-CM | POA: Diagnosis not present

## 2024-09-11 ENCOUNTER — Emergency Department

## 2024-09-11 ENCOUNTER — Other Ambulatory Visit: Payer: Self-pay

## 2024-09-11 ENCOUNTER — Observation Stay
Admission: EM | Admit: 2024-09-11 | Discharge: 2024-09-14 | DRG: 640 | Disposition: A | Attending: Internal Medicine | Admitting: Internal Medicine

## 2024-09-11 DIAGNOSIS — I5A Non-ischemic myocardial injury (non-traumatic): Secondary | ICD-10-CM

## 2024-09-11 DIAGNOSIS — E875 Hyperkalemia: Principal | ICD-10-CM | POA: Diagnosis present

## 2024-09-11 DIAGNOSIS — R079 Chest pain, unspecified: Secondary | ICD-10-CM | POA: Diagnosis present

## 2024-09-11 DIAGNOSIS — D631 Anemia in chronic kidney disease: Secondary | ICD-10-CM | POA: Diagnosis present

## 2024-09-11 DIAGNOSIS — K219 Gastro-esophageal reflux disease without esophagitis: Secondary | ICD-10-CM | POA: Diagnosis present

## 2024-09-11 DIAGNOSIS — N186 End stage renal disease: Secondary | ICD-10-CM | POA: Diagnosis present

## 2024-09-11 DIAGNOSIS — I10 Essential (primary) hypertension: Secondary | ICD-10-CM | POA: Diagnosis present

## 2024-09-11 LAB — TROPONIN T, HIGH SENSITIVITY: Troponin T High Sensitivity: 63 ng/L — ABNORMAL HIGH (ref 0–19)

## 2024-09-11 LAB — CBC WITH DIFFERENTIAL/PLATELET
Abs Immature Granulocytes: 0.02 K/uL (ref 0.00–0.07)
Basophils Absolute: 0 K/uL (ref 0.0–0.1)
Basophils Relative: 0 %
Eosinophils Absolute: 0.1 K/uL (ref 0.0–0.5)
Eosinophils Relative: 1 %
HCT: 33.7 % — ABNORMAL LOW (ref 36.0–46.0)
Hemoglobin: 10.9 g/dL — ABNORMAL LOW (ref 12.0–15.0)
Immature Granulocytes: 0 %
Lymphocytes Relative: 20 %
Lymphs Abs: 1.3 K/uL (ref 0.7–4.0)
MCH: 30.5 pg (ref 26.0–34.0)
MCHC: 32.3 g/dL (ref 30.0–36.0)
MCV: 94.4 fL (ref 80.0–100.0)
Monocytes Absolute: 0.3 K/uL (ref 0.1–1.0)
Monocytes Relative: 4 %
Neutro Abs: 4.8 K/uL (ref 1.7–7.7)
Neutrophils Relative %: 75 %
Platelets: 178 K/uL (ref 150–400)
RBC: 3.57 MIL/uL — ABNORMAL LOW (ref 3.87–5.11)
RDW: 13.5 % (ref 11.5–15.5)
WBC: 6.5 K/uL (ref 4.0–10.5)
nRBC: 0 % (ref 0.0–0.2)

## 2024-09-11 LAB — COMPREHENSIVE METABOLIC PANEL WITH GFR
ALT: 10 U/L (ref 0–44)
AST: 16 U/L (ref 15–41)
Albumin: 4.4 g/dL (ref 3.5–5.0)
Alkaline Phosphatase: 72 U/L (ref 38–126)
Anion gap: 23 — ABNORMAL HIGH (ref 5–15)
BUN: 110 mg/dL — ABNORMAL HIGH (ref 8–23)
CO2: 15 mmol/L — ABNORMAL LOW (ref 22–32)
Calcium: 9.6 mg/dL (ref 8.9–10.3)
Chloride: 97 mmol/L — ABNORMAL LOW (ref 98–111)
Creatinine, Ser: 11.3 mg/dL — ABNORMAL HIGH (ref 0.44–1.00)
GFR, Estimated: 3 mL/min — ABNORMAL LOW (ref >60)
Glucose, Bld: 105 mg/dL — ABNORMAL HIGH (ref 70–99)
Potassium: 7.1 mmol/L (ref 3.5–5.1)
Sodium: 134 mmol/L — ABNORMAL LOW (ref 135–145)
Total Bilirubin: 0.3 mg/dL (ref 0.0–1.2)
Total Protein: 7.9 g/dL (ref 6.5–8.1)

## 2024-09-11 MED ORDER — ACETAMINOPHEN 325 MG PO TABS
650.0000 mg | ORAL_TABLET | Freq: Four times a day (QID) | ORAL | Status: DC | PRN
  Administered 2024-09-12 – 2024-09-14 (×2): 650 mg via ORAL
  Filled 2024-09-11 (×2): qty 2

## 2024-09-11 MED ORDER — SODIUM ZIRCONIUM CYCLOSILICATE 10 G PO PACK
10.0000 g | PACK | Freq: Once | ORAL | Status: AC
  Administered 2024-09-11: 10 g via ORAL
  Filled 2024-09-11: qty 1

## 2024-09-11 MED ORDER — ONDANSETRON HCL 4 MG/2ML IJ SOLN
4.0000 mg | Freq: Once | INTRAMUSCULAR | Status: AC
  Administered 2024-09-12: 4 mg via INTRAVENOUS
  Filled 2024-09-11: qty 2

## 2024-09-11 MED ORDER — CHLORHEXIDINE GLUCONATE CLOTH 2 % EX PADS
6.0000 | MEDICATED_PAD | Freq: Every day | CUTANEOUS | Status: DC
  Administered 2024-09-14: 07:00:00 6 via TOPICAL
  Filled 2024-09-11: qty 6

## 2024-09-11 MED ORDER — ALBUTEROL SULFATE HFA 108 (90 BASE) MCG/ACT IN AERS: 2.0000 | INHALATION_SPRAY | RESPIRATORY_TRACT | Status: DC | PRN

## 2024-09-11 MED ORDER — ASPIRIN 81 MG PO TBEC: 81.0000 mg | DELAYED_RELEASE_TABLET | Freq: Every day | ORAL | Status: DC

## 2024-09-11 MED ORDER — HYDRALAZINE HCL 20 MG/ML IJ SOLN: 10.0000 mg | INTRAMUSCULAR | Status: DC | PRN

## 2024-09-11 MED ORDER — SODIUM BICARBONATE 8.4 % IV SOLN
50.0000 meq | Freq: Once | INTRAVENOUS | Status: AC
  Administered 2024-09-12: 50 meq via INTRAVENOUS
  Filled 2024-09-11: qty 50

## 2024-09-11 MED ORDER — CALCIUM GLUCONATE 10 % IV SOLN
1.0000 g | Freq: Once | INTRAVENOUS | Status: AC
  Administered 2024-09-11: 1 g via INTRAVENOUS
  Filled 2024-09-11: qty 10

## 2024-09-11 MED ORDER — DEXTROSE 50 % IV SOLN
50.0000 mL | Freq: Once | INTRAVENOUS | Status: AC
  Administered 2024-09-11: 50 mL via INTRAVENOUS
  Filled 2024-09-11: qty 50

## 2024-09-11 MED ORDER — HEPARIN SODIUM (PORCINE) 5000 UNIT/ML IJ SOLN: 5000.0000 [IU] | Freq: Three times a day (TID) | INTRAMUSCULAR | Status: DC

## 2024-09-11 MED ORDER — INSULIN ASPART 100 UNIT/ML IJ SOLN
10.0000 [IU] | Freq: Once | INTRAMUSCULAR | Status: AC
  Administered 2024-09-11: 10 [IU] via INTRAVENOUS
  Filled 2024-09-11: qty 10

## 2024-09-11 MED ORDER — ALBUTEROL SULFATE (2.5 MG/3ML) 0.083% IN NEBU: 2.5000 mg | INHALATION_SOLUTION | RESPIRATORY_TRACT | Status: DC | PRN

## 2024-09-11 MED ORDER — NITROGLYCERIN 0.4 MG SL SUBL: 0.4000 mg | SUBLINGUAL_TABLET | SUBLINGUAL | Status: DC | PRN

## 2024-09-11 MED ORDER — OXYCODONE-ACETAMINOPHEN 5-325 MG PO TABS
1.0000 | ORAL_TABLET | ORAL | Status: DC | PRN
  Administered 2024-09-12: 1 via ORAL
  Filled 2024-09-11: qty 1

## 2024-09-11 MED ORDER — ONDANSETRON HCL 4 MG/2ML IJ SOLN
4.0000 mg | Freq: Three times a day (TID) | INTRAMUSCULAR | Status: DC | PRN
  Administered 2024-09-12: 4 mg via INTRAVENOUS
  Filled 2024-09-11: qty 2

## 2024-09-11 NOTE — ED Provider Notes (Signed)
 [facility] Provider Note    Event Date/Time   First MD Initiated Contact with Patient 09/11/24 2058     (approximate)   History   Gastroesophageal Reflux and Abdominal Pain (EMS reports the patient has a head pain and abdominal pain.)   HPI  BRANDY ZUBA is a 83 y.o. 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  The patient was on vacation for 1 week in [place] and did not attend her dialysis appointments on Monday, Wednesday, or Friday.    Physical Exam   Triage Vital Signs: ED Triage Vitals  Encounter Vitals Group     BP --      Girls Systolic BP Percentile --      Girls Diastolic BP Percentile --      Boys Systolic BP Percentile --      Boys Diastolic BP Percentile --      Pulse Rate 09/11/24 2059 82     Resp 09/11/24 2059 19     Temp 09/11/24 2059 98.6 F (37 C)     Temp Source 09/11/24 2059 Oral     SpO2 09/11/24 2059 97 %     Weight --      Height --      Head Circumference --      Peak Flow --      Pain Score 09/11/24 2101 8     Pain Loc --      Pain Education --      Exclude from Growth Chart --     Most recent vital signs: Vitals:   09/11/24 2059  Pulse: 82  Resp: 19  Temp: 98.6 F (37 C)  SpO2: 97%     General: Awake, no distress.  CV:  Good peripheral perfusion.  Resp:  Normal effort.  Abd:  No distention.  Other:     ED Results / Procedures / Treatments   Labs (all labs ordered are listed, but only abnormal results are displayed) Labs Reviewed  COMPREHENSIVE METABOLIC PANEL WITH GFR  CBC WITH DIFFERENTIAL/PLATELET  TROPONIN T, HIGH SENSITIVITY     EKG  ED ECG REPORT I, Reche CHRISTELLA Leventhal, the attending physician, personally viewed and interpreted this ECG.  Date: 09/12/2024 Rate: 74 Rhythm: normal sinus rhythm QRS Axis: Left axis deviation Intervals:  normal ST/T Wave abnormalities: RBBB Narrative Interpretation: no evidence of acute ischemia    RADIOLOGY No acute findings on chest x-ray    PROCEDURES:  Critical Care performed:   CRITICAL CARE Performed by: Reche CHRISTELLA Leventhal   Total critical care time: 30 minutes  Critical care time was exclusive of separately billable procedures and treating other patients.  Critical care was necessary to treat or prevent imminent or life-threatening deterioration.  Critical care was time spent personally by me on the following activities: development of treatment plan with patient and/or surrogate as well as nursing, discussions with consultants, evaluation of patient's response to treatment, examination of patient, obtaining history from patient or surrogate, ordering and performing treatments and interventions, ordering and review of laboratory studies, ordering and review of radiographic studies, pulse oximetry and re-evaluation of patient's condition.   Procedures   MEDICATIONS ORDERED IN ED: Medications - No data to display   IMPRESSION / MDM / ASSESSMENT AND PLAN / ED COURSE  I reviewed the triage vital signs and the nursing notes.  Differential diagnosis includes, but is not limited to, hyperkalemia, fluid overload, pulmonary effusion, MI, pneumonia  Patient's presentation is most consistent with severe exacerbation of chronic illness.  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  On her workup here today she is noted to have a potassium of 7.1.  Potassium lowering but occasions including calcium  gluconate, insulin  and glucose, and Lokelma  ordered.  Nephrology was contacted and will arrange for emergent dialysis.  Discussed patient's case with the hospitalist who is in agreement with plan for admission.      FINAL CLINICAL  IMPRESSION(S) / ED DIAGNOSES   Final diagnoses:  Hyperkalemia  ESRD needing dialysis (HCC)     Rx / DC Orders   ED Discharge Orders     None        Note:  This document was prepared using Dragon voice recognition software and may include unintentional dictation errors.   [place] Reche HERO, MD 09/12/24 FONTAINE

## 2024-09-11 NOTE — H&P (Signed)
 History and Physical    Leah Davies FMW:[id number] DOB: 1941/09/20 DOA: 09/11/2024  Referring MD/NP/PA:   PCP: Sherial Bail, MD   Patient coming from:  The patient is coming from home.     Chief Complaint: chest pain, weakness, HA  HPI: Leah Davies is a 83 y.o. female with medical history significant of ESRD-HD (MWF), HTN, HLD, gout, GERD, anemia, who presents with chest pain, weakness and HA.   Per patient and her 2 daughters at bedside, patient was on vacation in [place], just returned back from [place] by airplane (about 4 hours) today.  She missed her dialysis for the whole week.  She developed the chest pain, which is located on the left side of her chest, constant, radiating to the back, sharp, pleuritic, aggravated by deep breath.  Patient has mild SOB.  No cough, fever or chills.  Patient has nausea and vomited once earlier, no diarrhea or abdominal pain.  Still making little urine, no symptoms of UTI.  No fall or head injury.  No dark stool or rectal bleeding.  Data reviewed independently and ED Course: pt was found to have potassium of 7.1, bicarbonate of 15, creatinine 11.30, BUN 110, GFR 3, WBC 6.5, troponin 63.  Temperature normal, blood pressure 171/59, heart rate 82, RR 19, oxygen/97% on room air.  Chest x-ray showed patchy opacity in left lower lobe.  Patient is placed in telemetry bed to follow-up patient.  Consulted Dr. Marcelino of renal.   EKG: I have personally reviewed.  Sinus rhythm, QTc 446, bifascicular block, T wave peaking diffusely   Review of Systems:   General: no fevers, chills, no body weight gain,  has fatigue and HA HEENT: no blurry vision, hearing changes or sore throat Respiratory: has dyspnea, coughing, no wheezing CV: no chest pain, no palpitations GI: had nausea, vomiting, no abdominal pain, diarrhea, constipation GU: no dysuria, burning on urination, increased urinary frequency, hematuria  Ext: has trace leg edema Neuro: no unilateral  weakness, numbness, or tingling, no vision change or hearing loss Skin: no rash, no skin tear. MSK: No muscle spasm, no deformity, no limitation of range of movement in spin Heme: No easy bruising.  Travel history: has recent long distant travel.   Allergy: Allergies[1]  Past Medical History:  Diagnosis Date   Arthritis    Chronic kidney disease    renal insufficiency   Complication of anesthesia    Vital signs low during colonoscopy had trouble to wake up    Elevated lipids    GERD (gastroesophageal reflux disease)    Glaucoma    Gout    HLD (hyperlipidemia)    Hypertension     Past Surgical History:  Procedure Laterality Date   A/V FISTULAGRAM Left 08/22/2020   Procedure: A/V FISTULAGRAM;  Surgeon: Jama Cordella MATSU, MD;  Location: [facility];  Service: Cardiovascular;  Laterality: Left;   A/V FISTULAGRAM Left 01/02/2021   Procedure: A/V FISTULAGRAM;  Surgeon: Jama Cordella MATSU, MD;  Location: [facility];  Service: Cardiovascular;  Laterality: Left;   A/V FISTULAGRAM Left 04/11/2021   Procedure: A/V Fistulagram;  Surgeon: Marea Selinda RAMAN, MD;  Location: [facility];  Service: Cardiovascular;  Laterality: Left;   A/V FISTULAGRAM Left 05/12/2023   Procedure: A/V Fistulagram;  Surgeon: Marea Selinda RAMAN, MD;  Location: [facility];  Service: Cardiovascular;  Laterality: Left;   AV FISTULA PLACEMENT Left 07/28/2020   Procedure: ARTERIOVENOUS (AV) FISTULA CREATION (BRACHIAL CEPHALIC );  Surgeon: Jama Cordella MATSU, MD;  Location: [facility];  Service: Vascular;  Laterality: Left;   BREAST EXCISIONAL BIOPSY Left 2010   benign   BREAST SURGERY     Lt breast biopsy   DIALYSIS/PERMA CATHETER INSERTION N/A 04/10/2020   Procedure: DIALYSIS/PERMA CATHETER INSERTION;  Surgeon: Marea Selinda RAMAN, MD;  Location: [facility];  Service: Cardiovascular;  Laterality: N/A;   DIALYSIS/PERMA CATHETER REMOVAL N/A 01/30/2021   Procedure: DIALYSIS/PERMA CATHETER  REMOVAL;  Surgeon: Jama Cordella MATSU, MD;  Location: [facility];  Service: Cardiovascular;  Laterality: N/A;   KNEE ARTHROSCOPY Left 10/05/2015   Procedure: ARTHROSCOPY KNEE partial medial and lateral meniscectomy, lateral release for sublux patella;  Surgeon: Ozell Flake, MD;  Location: [facility];  Service: Orthopedics;  Laterality: Left;    Social History:  reports that she has never smoked. She has never used smokeless tobacco. She reports that she does not currently use alcohol. She reports that she does not use drugs.  Family History:  Family History  Problem Relation Age of Onset   Breast cancer Sister 30     Prior to Admission medications  Medication Sig Start Date End Date Taking? Authorizing Provider  acetaminophen  (TYLENOL ) 500 MG tablet Take 1,000 mg by mouth every 6 (six) hours as needed.    [provider]  aspirin  81 MG EC tablet Take 1 tablet by mouth daily. Patient not taking: Reported on 05/12/2023 04/12/21   [provider]  aspirin  EC 81 MG EC tablet Take 1 tablet (81 mg total) by mouth daily. Swallow whole. Patient not taking: Reported on 05/12/2023 04/12/21   Laurita Pillion, MD  calcium  acetate (PHOSLO) 667 MG tablet Take 667 mg by mouth 3 (three) times daily.    [provider]  hydrocortisone  (ANUSOL -HC) 2.5 % rectal cream Place 1 Application rectally 2 (two) times daily. 04/28/23   Rodriguez-Southworth, Sylvia, PA-C  losartan  (COZAAR ) 50 MG tablet Take 50 mg by mouth daily.    [provider]  meclizine  (ANTIVERT ) 25 MG tablet TAKE 1 TABLET BY MOUTH 3 TIMES DAILY AS NEEDED FOR DIZZINESS OR NAUSEA. 03/26/23   [provider]  multivitamin (RENA-VIT) TABS tablet Take 1 tablet by mouth daily.    [provider]  NIFEdipine  (PROCARDIA -XL/NIFEDICAL-XL) 30 MG 24 hr tablet Take 30 mg by mouth at bedtime.    [provider]  omeprazole (PRILOSEC) 20 MG capsule Take 20 mg by mouth daily as needed (reflux).   08/18/20   [provider]    Physical Exam: Vitals:   09/11/24 2300 09/11/24 2315 09/11/24 2320 09/11/24 2329  BP: (!) 168/54 (!) 169/61 (!) 170/63   Pulse: 78 70 79   Resp: (!) 21 17 12    Temp:      TempSrc:      SpO2: 97% 97% 95% 97%   General: Not in acute distress HEENT:       Eyes: PERRL, EOMI, no jaundice       ENT: No discharge from the ears and nose, no pharynx injection, no tonsillar enlargement.        Neck: No JVD, no bruit, no mass felt. Heme: No neck lymph node enlargement. Cardiac: S1/S2, RRR, No murmurs, No gallops or rubs. Respiratory: No rales, wheezing, rhonchi or rubs. GI: Soft, nondistended, nontender, no rebound pain, no organomegaly, BS present. GU: No hematuria Ext: has trace leg edema bilaterally. 1+DP/PT pulse bilaterally. Musculoskeletal: No joint deformities, No joint redness or warmth, no limitation of ROM in  spin. Skin: No rashes.  Neuro: Alert, oriented X3, cranial nerves II-XII grossly intact, moves all extremities normally. Psych: Patient is not psychotic, no suicidal or hemocidal ideation.  Labs on Admission: I have personally reviewed following labs and imaging studies  CBC: Recent Labs  Lab 09/11/24 2130  WBC 6.5  NEUTROABS 4.8  HGB 10.9*  HCT 33.7*  MCV 94.4  PLT 178   Basic Metabolic Panel: Recent Labs  Lab 09/11/24 2130  NA 134*  K 7.1*  CL 97*  CO2 15*  GLUCOSE 105*  BUN 110*  CREATININE 11.30*  CALCIUM  9.6   GFR: CrCl cannot be calculated (Unknown ideal weight.). Liver Function Tests: Recent Labs  Lab 09/11/24 2130  AST 16  ALT 10  ALKPHOS 72  BILITOT 0.3  PROT 7.9  ALBUMIN 4.4   No results for input(s): LIPASE, AMYLASE in the last 168 hours. No results for input(s): AMMONIA in the last 168 hours. Coagulation Profile: No results for input(s): INR, PROTIME in the last 168 hours. Cardiac Enzymes: No results for input(s): CKTOTAL, CKMB, CKMBINDEX, TROPONINI in the last 168  hours. BNP (last 3 results) No results for input(s): PROBNP in the last 8760 hours. HbA1C: No results for input(s): HGBA1C in the last 72 hours. CBG: No results for input(s): GLUCAP in the last 168 hours. Lipid Profile: No results for input(s): CHOL, HDL, LDLCALC, TRIG, CHOLHDL, LDLDIRECT in the last 72 hours. Thyroid  Function Tests: No results for input(s): TSH, T4TOTAL, FREET4, T3FREE, THYROIDAB in the last 72 hours. Anemia Panel: No results for input(s): VITAMINB12, FOLATE, FERRITIN, TIBC, IRON, RETICCTPCT in the last 72 hours. Urine analysis:    Component Value Date/Time   COLORURINE COLORLESS (A) 04/08/2020 1535   APPEARANCEUR CLEAR (A) 04/08/2020 1535   LABSPEC 1.006 04/08/2020 1535   PHURINE 5.0 04/08/2020 1535   GLUCOSEU NEGATIVE 04/08/2020 1535   HGBUR NEGATIVE 04/08/2020 1535   BILIRUBINUR NEGATIVE 04/08/2020 1535   KETONESUR NEGATIVE 04/08/2020 1535   PROTEINUR 100 (A) 04/08/2020 1535   NITRITE NEGATIVE 04/08/2020 1535   LEUKOCYTESUR NEGATIVE 04/08/2020 1535   Sepsis Labs: @LABRCNTIP (procalcitonin:4,lacticidven:4) )No results found for this or any previous visit (from the past 240 hours).   Radiological Exams on Admission:   Assessment/Plan Principal Problem:   Hyperkalemia Active Problems:   ESRD (end stage renal disease) (HCC)   Chest pain   NSTEMI (non-ST elevated myocardial infarction) (HCC)   Essential hypertension   GERD (gastroesophageal reflux disease)   Anemia in ESRD (end-stage renal disease) (HCC)   Assessment and Plan:   Hyperkalemia and ESRD (end stage renal disease) (MWF): pt missed dialysis for whole week. Potassium 7.1 with T wave peaking on EKG.  -will place in tele bed for obs - Patient is treated with D50, 10 units NovoLog , 1 g of calcium  gluconate, 50 mEq of sodium bicarbonate , 10 g Lokelma  - consulted Dr. Marcelino of renal for dialysis  Chest pain: Patient has pleuritic chest pain, troponin  63 which is not significant in the setting of ESRD.  Likely due to demand ischemia, however since patient has recent traveling, need to rule out PE.  Consulted Dr. Ammon of cardiology. - Trend Trop - prn Percocet, Tylenol  for pain - prn Nitroglycerin  - start ASA - check D-dimer. If it is positive, will do CTA to r/o PE  Addendum:  trop is up 63 --> 318, indicating possible non-STEMI.  D-dimer +0.82.  Need to rule out PE. -Start IV heparin  -start lipitor  40 mg daily -f/u CTA to r/o  PE -f/u LE doppler to r/o DVT   Essential hypertension -IV hydralazine  as needed - Continue home Cozaar , nifedipine   GERD (gastroesophageal reflux disease) -Protonix   Anemia in ESRD (end-stage renal disease) (HCC): Hemoglobin 10.9, stable.  Patient had hemoglobin 10.8 on 02/24/2024). - Follow-up with CBC     DVT ppx: on IV Heparin         Code Status: Full code    Family Communication:    Yes, patient's 2 daughters at bed side.      Disposition Plan:  Anticipate discharge back to previous environment  Consults called:  Consulted Dr. Marcelino of renal.  Consulted Dr. Ammon of cardiology.  Admission status and Level of care: Telemetry:    for obs     Dispo: The patient is from: Home              Anticipated d/c is to: Home              Anticipated d/c date is: 1 day              Patient currently is not medically stable to d/c.    Severity of Illness:  The appropriate patient status for this patient is OBSERVATION. Observation status is judged to be reasonable and necessary in order to provide the required intensity of service to ensure the patient's safety. The patient's presenting symptoms, physical exam findings, and initial radiographic and laboratory data in the context of their medical condition is felt to place them at decreased risk for further clinical deterioration. Furthermore, it is anticipated that the patient will be medically stable for discharge from the hospital within 2  midnights of admission.        Date of Service 09/12/2024    Caleb Exon [facility]   If 7PM-7AM, please contact night-coverage [email or web address] 09/12/2024, 12:44 AM     [1]  Allergies Allergen Reactions   Amlodipine  Hypertension, Other (See Comments) and Rash    Caused increased blood pressure  Other reaction(s): kidney failure Caused increased blood pressure    Other reaction(s): Unknown Other reaction(s): Unknown    Caused increased blood pressure   Azithromycin  Other (See Comments)    Unknown  Other reaction(s): Unknown Unknown    unknown    Unknown   Prednisone Other (See Comments)    Pt states she feels like her BP dropped, got dizzy, and very pale.   Tizanidine Other (See Comments)    Dizziness, pt states BP dropped, and very pale.

## 2024-09-11 NOTE — ED Triage Notes (Signed)
 EMS reports the patient has abd pain, head pain, has recently returned from [place], has not had dialysis for a week.

## 2024-09-12 ENCOUNTER — Observation Stay

## 2024-09-12 DIAGNOSIS — I214 Non-ST elevation (NSTEMI) myocardial infarction: Secondary | ICD-10-CM | POA: Diagnosis present

## 2024-09-12 LAB — BASIC METABOLIC PANEL WITH GFR
Anion gap: 22 — ABNORMAL HIGH (ref 5–15)
Anion gap: 18 — ABNORMAL HIGH (ref 5–15)
BUN: 108 mg/dL — ABNORMAL HIGH (ref 8–23)
BUN: 40 mg/dL — ABNORMAL HIGH (ref 8–23)
CO2: 15 mmol/L — ABNORMAL LOW (ref 22–32)
CO2: 25 mmol/L (ref 22–32)
Calcium: 9.7 mg/dL (ref 8.9–10.3)
Calcium: 9.4 mg/dL (ref 8.9–10.3)
Chloride: 99 mmol/L (ref 98–111)
Chloride: 93 mmol/L — ABNORMAL LOW (ref 98–111)
Creatinine, Ser: 11.4 mg/dL — ABNORMAL HIGH (ref 0.44–1.00)
Creatinine, Ser: 5.72 mg/dL — ABNORMAL HIGH (ref 0.44–1.00)
GFR, Estimated: 3 mL/min — ABNORMAL LOW (ref >60)
GFR, Estimated: 7 mL/min — ABNORMAL LOW (ref >60)
Glucose, Bld: 91 mg/dL (ref 70–99)
Glucose, Bld: 84 mg/dL (ref 70–99)
Potassium: 6.2 mmol/L — ABNORMAL HIGH (ref 3.5–5.1)
Potassium: 4.1 mmol/L (ref 3.5–5.1)
Sodium: 136 mmol/L (ref 135–145)
Sodium: 136 mmol/L (ref 135–145)

## 2024-09-12 LAB — TROPONIN T, HIGH SENSITIVITY
Troponin T High Sensitivity: 1315 ng/L (ref 0–19)
Troponin T High Sensitivity: 318 ng/L (ref 0–19)
Troponin T High Sensitivity: 613 ng/L (ref 0–19)
Troponin T High Sensitivity: 754 ng/L (ref 0–19)

## 2024-09-12 LAB — CBG MONITORING, ED
Glucose-Capillary: 97 mg/dL (ref 70–99)
Glucose-Capillary: 97 mg/dL (ref 70–99)

## 2024-09-12 LAB — PROTIME-INR
INR: 1 (ref 0.8–1.2)
Prothrombin Time: 14.2 s (ref 11.4–15.2)

## 2024-09-12 LAB — HEPATITIS B SURFACE ANTIGEN: Hepatitis B Surface Ag: NONREACTIVE

## 2024-09-12 LAB — HEPARIN LEVEL (UNFRACTIONATED)
Heparin Unfractionated: 0.26 [IU]/mL — ABNORMAL LOW (ref 0.30–0.70)
Heparin Unfractionated: 0.35 [IU]/mL (ref 0.30–0.70)

## 2024-09-12 LAB — D-DIMER, QUANTITATIVE: D-Dimer, Quant: 0.82 ug{FEU}/mL — ABNORMAL HIGH (ref 0.00–0.50)

## 2024-09-12 LAB — APTT: aPTT: 35 s (ref 24–36)

## 2024-09-12 MED ORDER — SCOPOLAMINE 1 MG/3DAYS TD PT72
1.0000 | MEDICATED_PATCH | TRANSDERMAL | Status: DC
  Administered 2024-09-12: 1 mg via TRANSDERMAL
  Filled 2024-09-12: qty 1

## 2024-09-12 MED ORDER — HEPARIN BOLUS VIA INFUSION
1000.0000 [IU] | Freq: Once | INTRAVENOUS | Status: AC
  Administered 2024-09-12: 1000 [IU] via INTRAVENOUS
  Filled 2024-09-12: qty 1000

## 2024-09-12 MED ORDER — HEPARIN (PORCINE) 25000 UT/250ML-% IV SOLN
950.0000 [IU]/h | INTRAVENOUS | Status: DC
  Administered 2024-09-12: 700 [IU]/h via INTRAVENOUS
  Administered 2024-09-13: 07:00:00 850 [IU]/h via INTRAVENOUS
  Filled 2024-09-12 (×3): qty 250

## 2024-09-12 MED ORDER — ASPIRIN 81 MG PO TBEC
81.0000 mg | DELAYED_RELEASE_TABLET | Freq: Every day | ORAL | Status: DC
  Administered 2024-09-12 – 2024-09-14 (×3): 81 mg via ORAL
  Filled 2024-09-12 (×3): qty 1

## 2024-09-12 MED ORDER — METOPROLOL SUCCINATE ER 25 MG PO TB24
25.0000 mg | ORAL_TABLET | Freq: Every day | ORAL | Status: DC
  Administered 2024-09-12 – 2024-09-14 (×3): 25 mg via ORAL
  Filled 2024-09-12 (×3): qty 1

## 2024-09-12 MED ORDER — SODIUM ZIRCONIUM CYCLOSILICATE 10 G PO PACK
10.0000 g | PACK | Freq: Once | ORAL | Status: DC
  Filled 2024-09-12: qty 1

## 2024-09-12 MED ORDER — ATORVASTATIN CALCIUM 20 MG PO TABS: 40.0000 mg | ORAL_TABLET | Freq: Every day | ORAL | Status: DC

## 2024-09-12 MED ORDER — ASPIRIN 325 MG PO TBEC
325.0000 mg | DELAYED_RELEASE_TABLET | Freq: Once | ORAL | Status: AC
  Administered 2024-09-12: 325 mg via ORAL
  Filled 2024-09-12: qty 1

## 2024-09-12 MED ORDER — HEPARIN BOLUS VIA INFUSION
3200.0000 [IU] | Freq: Once | INTRAVENOUS | Status: AC
  Administered 2024-09-12: 3200 [IU] via INTRAVENOUS
  Filled 2024-09-12: qty 3200

## 2024-09-12 MED ORDER — ATORVASTATIN CALCIUM 20 MG PO TABS
40.0000 mg | ORAL_TABLET | Freq: Every day | ORAL | Status: DC
  Administered 2024-09-12 – 2024-09-14 (×3): 40 mg via ORAL
  Filled 2024-09-12 (×3): qty 2

## 2024-09-12 MED ORDER — IOHEXOL 350 MG/ML SOLN
75.0000 mL | Freq: Once | INTRAVENOUS | Status: AC | PRN
  Administered 2024-09-12: 75 mL via INTRAVENOUS

## 2024-09-12 NOTE — Progress Notes (Signed)
 PROGRESS NOTE    Leah Davies  FMW:[id number] DOB: [birth year] DOA: 09/11/2024 PCP: Sherial Bail, MD   Assessment & Plan:   Principal Problem:   Hyperkalemia Active Problems:   ESRD (end stage renal disease) (HCC)   Chest pain   NSTEMI (non-ST elevated myocardial infarction) (HCC)   Essential hypertension   GERD (gastroesophageal reflux disease)   Anemia in ESRD (end-stage renal disease) (HCC)  Assessment and Plan:  Hyperkalemia: secondary to pt missing HD x 1 week. Lokelma  x 1. WNL now. S/p HD today. Nephro following and recs apprec   ESRD: on HD MWF. Missed HD x 1 week. Needs HD. Nephro following and recs apprec    Chest pain: w/ elevated tropoins and trending up. Atypical features. Pleuritic chest pain. Pt's family do not want to proceed w/ cardiac cath at this time and continue w/ conservative management today and overnight. Cardio following and recs apprec  Elevated D-dimer: CTA chest neg for PE    HTN: continue on losartan , nifedipine    GERD: continue on PPI    ACD: likely secondary to ESRD. Will transfuse for Hb < 7.0   Obesity: BMI 30.1. Would benefit from weight loss     DVT prophylaxis: heparin  drip  Code Status: full  Family Communication: discussed pt's care w/ pt's family at bedside and answered their questions  Disposition Plan: depends on PT/OT recs (not consulted yet)   Level of care: Telemetry  Status is: Observation The patient remains OBS appropriate and will d/c before 2 midnights.    Consultants:  Cardio   Procedures:  Antimicrobials:    Subjective: Pt c/o leg pain   Objective: Vitals:   09/12/24 1007 09/12/24 1100 09/12/24 1200 09/12/24 1300  BP: (!) 150/49 (!) 149/64 (!) 148/58 (!) 146/57  Pulse: 75 89 75 71  Resp: 12 16 15 13   Temp: 97.9 F (36.6 C)     TempSrc: Oral     SpO2: 99% 100% 100% 99%  Weight:      Height:        Intake/Output Summary (Last 24 hours) at 09/12/2024 1606 Last data filed at 09/12/2024  0700 Gross per 24 hour  Intake --  Output 1500 ml  Net -1500 ml   Filed Weights   09/12/24 0101  Weight: 63.1 kg    Examination:  General exam: Appears calm and comfortable  Respiratory system: Clear to auscultation. Respiratory effort normal. Cardiovascular system: S1 & S2+. No  rubs, gallops or clicks.  Gastrointestinal system: Abdomen is obese, soft and nontender. Normal bowel sounds heard. Central nervous system: Alert and oriented. Moves all extremities Psychiatry: Judgement and insight appears at baseline. Flat mood and affect   Data Reviewed: I have personally reviewed following labs and imaging studies  CBC: Recent Labs  Lab 09/11/24 2130  WBC 6.5  NEUTROABS 4.8  HGB 10.9*  HCT 33.7*  MCV 94.4  PLT 178   Basic Metabolic Panel: Recent Labs  Lab 09/11/24 2130 09/12/24 0240 09/12/24 1032  NA 134* 136 136  K 7.1* 6.2* 4.1  CL 97* 99 93*  CO2 15* 15* 25  GLUCOSE 105* 91 84  BUN 110* 108* 40*  CREATININE 11.30* 11.40* 5.72*  CALCIUM  9.6 9.7 9.4   GFR: Estimated Creatinine Clearance: 5.8 mL/min (A) (by C-G formula based on SCr of 5.72 mg/dL (H)). Liver Function Tests: Recent Labs  Lab 09/11/24 2130  AST 16  ALT 10  ALKPHOS 72  BILITOT 0.3  PROT 7.9  ALBUMIN 4.4  No results for input(s): LIPASE, AMYLASE in the last 168 hours. No results for input(s): AMMONIA in the last 168 hours. Coagulation Profile: Recent Labs  Lab 09/12/24 0240  INR 1.0   Cardiac Enzymes: No results for input(s): CKTOTAL, CKMB, CKMBINDEX, TROPONINI in the last 168 hours. BNP (last 3 results) No results for input(s): PROBNP in the last 8760 hours. HbA1C: No results for input(s): HGBA1C in the last 72 hours. CBG: Recent Labs  Lab 09/12/24 0425 09/12/24 0535  GLUCAP 97 97   Lipid Profile: No results for input(s): CHOL, HDL, LDLCALC, TRIG, CHOLHDL, LDLDIRECT in the last 72 hours. Thyroid  Function Tests: No results for input(s): TSH,  T4TOTAL, FREET4, T3FREE, THYROIDAB in the last 72 hours. Anemia Panel: No results for input(s): VITAMINB12, FOLATE, FERRITIN, TIBC, IRON, RETICCTPCT in the last 72 hours. Sepsis Labs: No results for input(s): PROCALCITON, LATICACIDVEN in the last 168 hours.  No results found for this or any previous visit (from the past 240 hours).       Radiology Studies: US  Venous Img Lower Bilateral (DVT) Result Date: 09/12/2024 EXAM: ULTRASOUND DUPLEX OF THE BILATERAL LOWER EXTREMITY VEINS TECHNIQUE: Duplex ultrasound using B-mode/gray scaled imaging and Doppler spectral analysis and color flow was obtained of the deep venous structures of the bilateral lower extremity. COMPARISON: None available. CLINICAL HISTORY: 220372 Positive D dimer [id number] Positive D dimer FINDINGS: The common femoral veins, femoral veins, popliteal veins, and posterior tibial veins demonstrate normal compressibility with normal color flow and spectral analysis. IMPRESSION: 1. No evidence of DVT. Electronically signed by: Francis Quam MD 09/12/2024 06:50 AM EST RP Workstation: [id number]   CT Angio Chest Pulmonary Embolism (PE) W or WO Contrast Result Date: 09/12/2024 EXAM: CTA of the Chest with contrast for PE 09/12/2024 02:33:48 AM TECHNIQUE: CTA of the chest was performed after the administration of 75 mL iohexol  (OMNIPAQUE ) 350 MG/ML injection. Multiplanar reformatted images are provided for review. MIP images are provided for review. Automated exposure control, iterative reconstruction, and/or weight based adjustment of the mA/kV was utilized to reduce the radiation dose to as low as reasonably achievable. COMPARISON: Chest x-ray from the previous day. CLINICAL HISTORY: Chest pain FINDINGS: PULMONARY ARTERIES: No pulmonary embolism. Main pulmonary artery is normal in caliber. The pulmonary artery shows a normal branching pattern. MEDIASTINUM: The heart and pericardium demonstrate no acute abnormality.  Coronary calcifications are noted. Atherosclerotic calcifications of the thoracic aorta are noted without an aneurysmal dilatation. The degree of enhancement is limited with regards to evaluation for dissection. The thoracic inlet is within normal limits. The esophagus as visualized is within normal limits. LYMPH NODES: No mediastinal, hilar or axillary lymphadenopathy. LUNGS AND PLEURA: The lungs are well aerated bilaterally. No focal infiltrate or pulmonary edema. No pleural effusion or pneumothorax. A few scattered calcified granulomas are noted. UPPER ABDOMEN: The visualized upper abdomen shows no acute abnormality. SOFT TISSUES AND BONES: No acute bone or soft tissue abnormality. IMPRESSION: 1. No pulmonary embolism. 2. Aortic atherosclerosis Electronically signed by: Oneil Devonshire MD 09/12/2024 02:48 AM EST RP Workstation: [id number]   DG Chest Portable 1 View Result Date: 09/11/2024 EXAM: 1 VIEW(S) XRAY OF THE CHEST 09/11/2024 10:18:58 PM COMPARISON: 04/10/2021 CLINICAL HISTORY: CP FINDINGS: LUNGS AND PLEURA: Mild patchy left lower lobe opacity, atelectasis versus pneumonia. Small left pleural effusion. No pneumothorax. HEART AND MEDIASTINUM: Thoracic atherosclerosis. No acute abnormality of the cardiac and mediastinal silhouettes. BONES AND SOFT TISSUES: No acute osseous abnormality. IMPRESSION: 1. Mild patchy left lower lobe opacity, atelectasis versus pneumonia. 2. Small  left pleural effusion. Electronically signed by: Pinkie Pebbles MD 09/11/2024 10:25 PM EST RP Workstation: [id number]        Scheduled Meds:  aspirin  EC  81 mg Oral Daily   atorvastatin   40 mg Oral Daily   Chlorhexidine  Gluconate Cloth  6 each Topical Q0600   metoprolol  succinate  25 mg Oral Daily   Continuous Infusions:  heparin  800 Units/hr (09/12/24 1153)     LOS: 0 days     Anthony CHRISTELLA Pouch, MD [facility] Pager [phone]  If 7PM-7AM, please contact night-coverage [email or web address] 09/12/2024,  4:06 PM

## 2024-09-12 NOTE — Consult Note (Signed)
 [facility] Cardiology  CARDIOLOGY CONSULT NOTE  Patient ID: Leah Davies MRN: [id number] DOB/AGE: [birth year] 83 y.o.  Admit date: 09/11/2024 Referring Physician Hilma Primary Physician Kerlan [facility] Primary Cardiologist Tinnie Maiden NP Reason for Consultation chest pain  HPI: 83 year old female referred for evaluation of chest pain.  She has a history of end-stage renal disease on chronic hemodialysis.  Patient went on vacation to [place], missed dialysis for a week, return 09/11/2024 at which time she felt weak, with nausea and vomiting, decreased urination, and pleuritic chest discomfort which radiated to her back.  She was brought to [facility] emergency room where she was noted to be hyperkalemic with a potassium of 7.1.  Chest x-ray revealed patchy opacity left upper lobe.  Troponin elevated (63, 318, 613, 754).  ECG revealed sinus rhythm at 67 bpm without ischemic ST-T wave changes.  D-dimer was 0.82.  Chest CT did not reveal evidence for pulmonary embolus.  The patient underwent hemodialysis 09/11/2024 with overall clinical improvement, and resolution of chest pain.  2D echocardiogram 04/29/2023 revealed LVEF greater than 55%.  ETT Myoview 07/01/2023 did not reveal evidence for scar or ischemia.  Review of systems complete and found to be negative unless listed above     Past Medical History:  Diagnosis Date   Arthritis    Chronic kidney disease    renal insufficiency   Complication of anesthesia    Vital signs low during colonoscopy had trouble to wake up    Elevated lipids    GERD (gastroesophageal reflux disease)    Glaucoma    Gout    HLD (hyperlipidemia)    Hypertension     Past Surgical History:  Procedure Laterality Date   A/V FISTULAGRAM Left 08/22/2020   Procedure: A/V FISTULAGRAM;  Surgeon: Jama Cordella MATSU, MD;  Location: [facility] INVASIVE CV LAB;  Service: Cardiovascular;  Laterality: Left;   A/V FISTULAGRAM Left 01/02/2021   Procedure: A/V FISTULAGRAM;  Surgeon: Jama Cordella MATSU,  MD;  Location: [facility] INVASIVE CV LAB;  Service: Cardiovascular;  Laterality: Left;   A/V FISTULAGRAM Left 04/11/2021   Procedure: A/V Fistulagram;  Surgeon: Marea Selinda RAMAN, MD;  Location: [facility] INVASIVE CV LAB;  Service: Cardiovascular;  Laterality: Left;   A/V FISTULAGRAM Left 05/12/2023   Procedure: A/V Fistulagram;  Surgeon: Marea Selinda RAMAN, MD;  Location: [facility] INVASIVE CV LAB;  Service: Cardiovascular;  Laterality: Left;   AV FISTULA PLACEMENT Left 07/28/2020   Procedure: ARTERIOVENOUS (AV) FISTULA CREATION (BRACHIAL CEPHALIC );  Surgeon: Jama Cordella MATSU, MD;  Location: [facility] ORS;  Service: Vascular;  Laterality: Left;   BREAST EXCISIONAL BIOPSY Left 2010   benign   BREAST SURGERY     Lt breast biopsy   DIALYSIS/PERMA CATHETER INSERTION N/A 04/10/2020   Procedure: DIALYSIS/PERMA CATHETER INSERTION;  Surgeon: Marea Selinda RAMAN, MD;  Location: [facility] INVASIVE CV LAB;  Service: Cardiovascular;  Laterality: N/A;   DIALYSIS/PERMA CATHETER REMOVAL N/A 01/30/2021   Procedure: DIALYSIS/PERMA CATHETER REMOVAL;  Surgeon: Jama Cordella MATSU, MD;  Location: [facility] INVASIVE CV LAB;  Service: Cardiovascular;  Laterality: N/A;   KNEE ARTHROSCOPY Left 10/05/2015   Procedure: ARTHROSCOPY KNEE partial medial and lateral meniscectomy, lateral release for sublux patella;  Surgeon: Ozell Flake, MD;  Location: [facility] ORS;  Service: Orthopedics;  Laterality: Left;    (Not in a hospital admission)  Social History   Socioeconomic History   Marital status: Single    Spouse name: Not on file   Number of children: 6   Years of education: Not on file  Highest education level: Not on file  Occupational History   Not on file  Tobacco Use   Smoking status: Never   Smokeless tobacco: Never  Vaping Use   Vaping status: Never Used  Substance and Sexual Activity   Alcohol use: Not Currently    Comment: rare   Drug use: No   Sexual activity: Not on file  Other Topics Concern   Not on file  Social History Narrative   Lives with  daughter Daved.    Social Drivers of Health   Tobacco Use: Low Risk  (07/08/2024)   Received from [facility]   Patient History    Smoking Tobacco Use: Never    Smokeless Tobacco Use: Never    Passive Exposure: Not on file  Financial Resource Strain: Low Risk  (07/08/2024)   Received from [facility]   Overall Financial Resource Strain (CARDIA)    Difficulty of Paying Living Expenses: Not hard at all  Food Insecurity: No Food Insecurity (07/08/2024)   Received from [facility]   Epic    Within the past 12 months, you worried that your food would run out before you got the money to buy more.: Never true    Within the past 12 months, the food you bought just didn't last and you didn't have money to get more.: Never true  Transportation Needs: No Transportation Needs (07/08/2024)   Received from [facility]    In the past 12 months, has lack of transportation kept you from medical appointments or from getting medications?: No    Lack of Transportation (Non-Medical): No  Physical Activity: Not on file  Stress: Not on file  Social Connections: Not on file  Intimate Partner Violence: Not on file  Depression (EYV7-0): Not on file  Alcohol Screen: Not on file  Housing: Low Risk  (07/08/2024)   Received from [facility]   Epic    In the last 12 months, was there a time when you were not able to pay the mortgage or rent on time?: No    In the past 12 months, how many times have you moved where you were living?: 0    At any time in the past 12 months, were you homeless or living in a shelter (including now)?: No  Utilities: Not At Risk (07/08/2024)   Received from [facility]   Epic    In the past 12 months has the electric, gas, oil, or water  company threatened to shut off services in your home?: No  Health Literacy: Not on file    Family History  Problem  Relation Age of Onset   Breast cancer Sister 82      Review of systems complete and found to be negative unless listed above      PHYSICAL EXAM  General: Well developed, well nourished, in no acute distress HEENT:  Normocephalic and atramatic Neck:  No JVD.  Lungs: Clear bilaterally to auscultation and percussion. Heart: HRRR . Normal S1 and S2 without gallops or murmurs.  Abdomen: Bowel sounds are positive, abdomen soft and non-tender  Msk:  Back normal, normal gait. Normal strength and tone for age. Extremities: No clubbing, cyanosis or edema.   Neuro: Alert and oriented X 3. Psych:  Good affect, responds appropriately  Labs:   Lab Results  Component Value Date   WBC 6.5 09/11/2024   HGB 10.9 (L) 09/11/2024  HCT 33.7 (L) 09/11/2024   MCV 94.4 09/11/2024   PLT 178 09/11/2024    Recent Labs  Lab 09/11/24 2130 09/12/24 0240  NA 134* 136  K 7.1* 6.2*  CL 97* 99  CO2 15* 15*  BUN 110* 108*  CREATININE 11.30* 11.40*  CALCIUM  9.6 9.7  PROT 7.9  --   BILITOT 0.3  --   ALKPHOS 72  --   ALT 10  --   AST 16  --   GLUCOSE 105* 91   Lab Results  Component Value Date   TROPONINI <0.03 10/07/2016   No results found for: CHOL No results found for: HDL No results found for: LDLCALC No results found for: TRIG No results found for: CHOLHDL No results found for: LDLDIRECT    Radiology: US  Venous Img Lower Bilateral (DVT) Result Date: 09/12/2024 EXAM: ULTRASOUND DUPLEX OF THE BILATERAL LOWER EXTREMITY VEINS TECHNIQUE: Duplex ultrasound using B-mode/gray scaled imaging and Doppler spectral analysis and color flow was obtained of the deep venous structures of the bilateral lower extremity. COMPARISON: None available. CLINICAL HISTORY: 220372 Positive D dimer [id number] Positive D dimer FINDINGS: The common femoral veins, femoral veins, popliteal veins, and posterior tibial veins demonstrate normal compressibility with normal color flow and spectral analysis.  IMPRESSION: 1. No evidence of DVT. Electronically signed by: Francis Quam MD 09/12/2024 06:50 AM EST RP Workstation: [id number]   CT Angio Chest Pulmonary Embolism (PE) W or WO Contrast Result Date: 09/12/2024 EXAM: CTA of the Chest with contrast for PE 09/12/2024 02:33:48 AM TECHNIQUE: CTA of the chest was performed after the administration of 75 mL iohexol  (OMNIPAQUE ) 350 MG/ML injection. Multiplanar reformatted images are provided for review. MIP images are provided for review. Automated exposure control, iterative reconstruction, and/or weight based adjustment of the mA/kV was utilized to reduce the radiation dose to as low as reasonably achievable. COMPARISON: Chest x-ray from the previous day. CLINICAL HISTORY: Chest pain FINDINGS: PULMONARY ARTERIES: No pulmonary embolism. Main pulmonary artery is normal in caliber. The pulmonary artery shows a normal branching pattern. MEDIASTINUM: The heart and pericardium demonstrate no acute abnormality. Coronary calcifications are noted. Atherosclerotic calcifications of the thoracic aorta are noted without an aneurysmal dilatation. The degree of enhancement is limited with regards to evaluation for dissection. The thoracic inlet is within normal limits. The esophagus as visualized is within normal limits. LYMPH NODES: No mediastinal, hilar or axillary lymphadenopathy. LUNGS AND PLEURA: The lungs are well aerated bilaterally. No focal infiltrate or pulmonary edema. No pleural effusion or pneumothorax. A few scattered calcified granulomas are noted. UPPER ABDOMEN: The visualized upper abdomen shows no acute abnormality. SOFT TISSUES AND BONES: No acute bone or soft tissue abnormality. IMPRESSION: 1. No pulmonary embolism. 2. Aortic atherosclerosis Electronically signed by: Oneil Devonshire MD 09/12/2024 02:48 AM EST RP Workstation: [id number]   DG Chest Portable 1 View Result Date: 09/11/2024 EXAM: 1 VIEW(S) XRAY OF THE CHEST 09/11/2024 10:18:58 PM COMPARISON:  04/10/2021 CLINICAL HISTORY: CP FINDINGS: LUNGS AND PLEURA: Mild patchy left lower lobe opacity, atelectasis versus pneumonia. Small left pleural effusion. No pneumothorax. HEART AND MEDIASTINUM: Thoracic atherosclerosis. No acute abnormality of the cardiac and mediastinal silhouettes. BONES AND SOFT TISSUES: No acute osseous abnormality. IMPRESSION: 1. Mild patchy left lower lobe opacity, atelectasis versus pneumonia. 2. Small left pleural effusion. Electronically signed by: Pinkie Pebbles MD 09/11/2024 10:25 PM EST RP Workstation: [id number]    EKG: Normal sinus rhythm at 67 bpm  ASSESSMENT AND PLAN:   1.  Chest pain with typical and atypical  features, nondiagnostic ECG, elevated troponin, in the setting of severe hyperkalemia after missing dialysis x 1 week, on heparin  drip.  With the help of telemetry interpreter, I discussed the risk, benefits alternatives of cardiac catheterization with the patient and her daughters, and at this time, they have decided to take a conservative approach, and see how she does overnight before proceeding with invasive procedure. 2.  Severe hyperkalemia due to missing dialysis for 1 week, improved after emergency hemodialysis 3.  Essential hypertension, blood pressure elevated, on hydralazine   Recommendations  1.  Agree with overall current plan 2.  Continue IV heparin  48-72 hours 3.  Add metoprolol  succinate 25 mg daily 4.  2D echocardiogram 5.  Will observe overnight, if patient remains clinically stable without chest pain, will defer cardiac catheterization at this time  Signed: Marsa Dooms MD,PhD, FACC 09/12/2024, 11:02 AM

## 2024-09-12 NOTE — ED Notes (Signed)
Cardiology MD at bedside assessing patient

## 2024-09-12 NOTE — Progress Notes (Signed)
 ANTICOAGULATION CONSULT NOTE  Pharmacy Consult for heparin  infusion Indication: ACS/STEMI  Allergies[1]  Patient Measurements: Height: 4' 9 (144.8 cm) Weight: 63.1 kg (139 lb 3.2 oz) IBW/kg (Calculated) : 38.6 HEPARIN  DW (KG): 52.7  Labs: Recent Labs    09/11/24 2130 09/12/24 0240 09/12/24 1032 09/12/24 2216  HGB 10.9*  --   --   --   HCT 33.7*  --   --   --   PLT 178  --   --   --   APTT  --  35  --   --   LABPROT  --  14.2  --   --   INR  --  1.0  --   --   HEPARINUNFRC  --   --  0.26* 0.35  CREATININE 11.30* 11.40* 5.72*  --     Estimated Creatinine Clearance: 5.8 mL/min (A) (by C-G formula based on SCr of 5.72 mg/dL (H)).   Medical History: Past Medical History:  Diagnosis Date   Arthritis    Chronic kidney disease    renal insufficiency   Complication of anesthesia    Vital signs low during colonoscopy had trouble to wake up    Elevated lipids    GERD (gastroesophageal reflux disease)    Glaucoma    Gout    HLD (hyperlipidemia)    Hypertension     Assessment: Pt is a 83 yo female presenting to ED c/o CP, weakness, & HA, found with elevated Troponin level, trending up.  1214 1032 HL 0.26, subthera; 700 un/hr 1214 2216 HL 0.35, therapeutic x 1  Goal of Therapy:  Heparin  level 0.3-0.7 units/ml Monitor platelets by anticoagulation protocol: Yes   Plan:  --Continue heparin  infusion rate at 800 units/hr --Recheck HL 8 hours in 8 hrs to confirm --Daily CBC per protocol while on IV heparin   Rankin CANDIE Dills, PharmD, [facility] 09/12/2024 11:44 PM       [1]  Allergies Allergen Reactions   Amlodipine  Hypertension, Other (See Comments) and Rash    Caused increased blood pressure  Other reaction(s): kidney failure Caused increased blood pressure    Other reaction(s): Unknown Other reaction(s): Unknown    Caused increased blood pressure   Azithromycin  Other (See Comments)    Unknown  Other reaction(s): Unknown Unknown    unknown    Unknown    Prednisone Other (See Comments)    Pt states she feels like her BP dropped, got dizzy, and very pale.   Tizanidine Other (See Comments)    Dizziness, pt states BP dropped, and very pale.

## 2024-09-12 NOTE — Progress Notes (Signed)
° °  Brief Progress Note   _____________________________________________________________________________________________________________  Patient Name: Leah Davies Patient DOB: 10/15/1940 Date: @TODAY @      Data: Reviewed vital signs, labs, and clinical notes.    Action: No action needed at this time.    Response:  Nephrology and Cards consult. Pt with ESRD on HD.  _____________________________________________________________________________________________________________  [facility] RN Expeditor Lorna Strother S Batina Dougan Please contact us  directly via secure chat (search for [facility]) or by calling us  at [phone] [facility]).

## 2024-09-12 NOTE — Progress Notes (Signed)
 ANTICOAGULATION CONSULT NOTE  Pharmacy Consult for heparin  infusion Indication: ACS/STEMI  Allergies[1]  Patient Measurements: Height: 4' 9 (144.8 cm) Weight: 63.1 kg (139 lb 3.2 oz) IBW/kg (Calculated) : 38.6 HEPARIN  DW (KG): 52.7  Vital Signs: Temp: 98.6 F (37 C) (12/14 0101) Temp Source: Oral (12/14 0101) BP: 167/64 (12/14 0101) Pulse Rate: 85 (12/14 0101)  Labs: Recent Labs    09/11/24 2130  HGB 10.9*  HCT 33.7*  PLT 178  CREATININE 11.30*    Estimated Creatinine Clearance: 2.9 mL/min (A) (by C-G formula based on SCr of 11.3 mg/dL (H)).   Medical History: Past Medical History:  Diagnosis Date   Arthritis    Chronic kidney disease    renal insufficiency   Complication of anesthesia    Vital signs low during colonoscopy had trouble to wake up    Elevated lipids    GERD (gastroesophageal reflux disease)    Glaucoma    Gout    HLD (hyperlipidemia)    Hypertension     Assessment: Pt is a 83 yo female presenting to ED c/o CP, weakness, & HA, found with elevated Troponin level, trending up.  Goal of Therapy:  Heparin  level 0.3-0.7 units/ml Monitor platelets by anticoagulation protocol: Yes   Plan:  Bolus 3200 units x 1 Start heparin  infusion at 700 units/hr Will check HL in 8 hr after start of infusion CBC daily while on heparin   Rankin CANDIE Dills, PharmD, [facility] 09/12/2024 1:15 AM      [1]  Allergies Allergen Reactions   Amlodipine  Hypertension, Other (See Comments) and Rash    Caused increased blood pressure  Other reaction(s): kidney failure Caused increased blood pressure    Other reaction(s): Unknown Other reaction(s): Unknown    Caused increased blood pressure   Azithromycin  Other (See Comments)    Unknown  Other reaction(s): Unknown Unknown    unknown    Unknown   Prednisone Other (See Comments)    Pt states she feels like her BP dropped, got dizzy, and very pale.   Tizanidine Other (See Comments)    Dizziness, pt states BP  dropped, and very pale.

## 2024-09-12 NOTE — ED Notes (Signed)
 Pt had an episode of bradycardia on cardiac monitor. Strip printed. EKG obtained. Pt continues to be nauseous. Denies CP, SOB.

## 2024-09-12 NOTE — Progress Notes (Signed)
 [facility]  ROUNDING NOTE   Subjective:   Patient under the care of of [facility] nephrology.  She dialyzes on MWF schedule at [facility].  Has missed dialysis x 1 week as she was visiting [place].  She presented here with severe metabolic derangements including hyperkalemia with potassium of 7.1, acute metabolic acidosis with serum bicarbonate of 15 and azotemia with a BUN of 110.  Patient did undergo urgent dialysis overnight.  Potassium now down to 4.1.  Objective:  Vital signs in last 24 hours:  Temp:  [97.4 F (36.3 C)-98.7 F (37.1 C)] 97.9 F (36.6 C) (12/14 1007) Pulse Rate:  [70-107] 75 (12/14 1200) Resp:  [11-23] 15 (12/14 1200) BP: (93-170)/(49-107) 148/58 (12/14 1200) SpO2:  [92 %-100 %] 100 % (12/14 1200) Weight:  [63.1 kg] 63.1 kg (12/14 0101)  Weight change:  Filed Weights   09/12/24 0101  Weight: 63.1 kg    Intake/Output: I/O last 3 completed shifts: In: -  Out: 1500 [Other:1500]   Intake/Output this shift:  No intake/output data recorded.  Physical Exam: General: No acute distress  Head: Normocephalic, atraumatic. Moist oral mucosal membranes  Neck: Supple  Lungs:  Clear to auscultation, normal effort  Heart: S1S2 no rubs  Abdomen:  Soft, nontender, bowel sounds present  Extremities: Trace peripheral edema.  Neurologic: Awake, alert, following commands  Skin: No acute rash  Access: Left upper extremity AV fistula    Basic Metabolic Panel: Recent Labs  Lab 09/11/24 2130 09/12/24 0240 09/12/24 1032  NA 134* 136 136  K 7.1* 6.2* 4.1  CL 97* 99 93*  CO2 15* 15* 25  GLUCOSE 105* 91 84  BUN 110* 108* 40*  CREATININE 11.30* 11.40* 5.72*  CALCIUM  9.6 9.7 9.4    Liver Function Tests: Recent Labs  Lab 09/11/24 2130  AST 16  ALT 10  ALKPHOS 72  BILITOT 0.3  PROT 7.9  ALBUMIN 4.4   No results for input(s): LIPASE, AMYLASE in the last 168 hours. No results for input(s): AMMONIA in the last 168 hours.  CBC: Recent  Labs  Lab 09/11/24 2130  WBC 6.5  NEUTROABS 4.8  HGB 10.9*  HCT 33.7*  MCV 94.4  PLT 178    Cardiac Enzymes: No results for input(s): CKTOTAL, CKMB, CKMBINDEX, TROPONINI in the last 168 hours.  BNP: Invalid input(s): POCBNP  CBG: Recent Labs  Lab 09/12/24 0425 09/12/24 0535  GLUCAP 97 97    Microbiology: Results for orders placed or performed during the hospital encounter of 04/10/21  Resp Panel by RT-PCR (Flu A&B, Covid) Nasopharyngeal Swab     Status: None   Collection Time: 04/10/21 11:56 AM   Specimen: Nasopharyngeal Swab; Nasopharyngeal(NP) swabs in vial transport medium  Result Value Ref Range Status   SARS Coronavirus 2 by RT PCR NEGATIVE NEGATIVE Final    Comment: (NOTE) SARS-CoV-2 target nucleic acids are NOT DETECTED.  The SARS-CoV-2 RNA is generally detectable in upper respiratory specimens during the acute phase of infection. The lowest concentration of SARS-CoV-2 viral copies this assay can detect is 138 copies/mL. A negative result does not preclude SARS-Cov-2 infection and should not be used as the sole basis for treatment or other patient management decisions. A negative result may occur with  improper specimen collection/handling, submission of specimen other than nasopharyngeal swab, presence of viral mutation(s) within the areas targeted by this assay, and inadequate number of viral copies(<138 copies/mL). A negative result must be combined with clinical observations, patient history, and epidemiological  information. The expected result is Negative.  Fact Sheet for Patients:  bloggercourse.com  Fact Sheet for Healthcare Providers:  seriousbroker.it  This test is no t yet approved or cleared by the [place]  FDA and  has been authorized for detection and/or diagnosis of SARS-CoV-2 by FDA under an Emergency Use Authorization (EUA). This EUA will remain  in effect (meaning this test  can be used) for the duration of the COVID-19 declaration under Section 564(b)(1) of the Act, 21 U.S.C.section 360bbb-3(b)(1), unless the authorization is terminated  or revoked sooner.       Influenza A by PCR NEGATIVE NEGATIVE Final   Influenza B by PCR NEGATIVE NEGATIVE Final    Comment: (NOTE) The Xpert Xpress SARS-CoV-2/FLU/RSV plus assay is intended as an aid in the diagnosis of influenza from Nasopharyngeal swab specimens and should not be used as a sole basis for treatment. Nasal washings and aspirates are unacceptable for Xpert Xpress SARS-CoV-2/FLU/RSV testing.  Fact Sheet for Patients: bloggercourse.com  Fact Sheet for Healthcare Providers: seriousbroker.it  This test is not yet approved or cleared by the [place]  FDA and has been authorized for detection and/or diagnosis of SARS-CoV-2 by FDA under an Emergency Use Authorization (EUA). This EUA will remain in effect (meaning this test can be used) for the duration of the COVID-19 declaration under Section 564(b)(1) of the Act, 21 U.S.C. section 360bbb-3(b)(1), unless the authorization is terminated or revoked.  Performed at [facility], [address] [place], [place] [address]     Coagulation Studies: Recent Labs    09/12/24 0240  LABPROT 14.2  INR 1.0    Urinalysis: No results for input(s): COLORURINE, LABSPEC, PHURINE, GLUCOSEU, HGBUR, BILIRUBINUR, KETONESUR, PROTEINUR, UROBILINOGEN, NITRITE, LEUKOCYTESUR in the last 72 hours.  Invalid input(s): APPERANCEUR    Imaging: US  Venous Img Lower Bilateral (DVT) Result Date: 09/12/2024 EXAM: ULTRASOUND DUPLEX OF THE BILATERAL LOWER EXTREMITY VEINS TECHNIQUE: Duplex ultrasound using B-mode/gray scaled imaging and Doppler spectral analysis and color flow was obtained of the deep venous structures of the bilateral lower extremity. COMPARISON: None available. CLINICAL  HISTORY: 220372 Positive D dimer [id number] Positive D dimer FINDINGS: The common femoral veins, femoral veins, popliteal veins, and posterior tibial veins demonstrate normal compressibility with normal color flow and spectral analysis. IMPRESSION: 1. No evidence of DVT. Electronically signed by: Francis Quam MD 09/12/2024 06:50 AM EST RP Workstation: [id number]   CT Angio Chest Pulmonary Embolism (PE) W or WO Contrast Result Date: 09/12/2024 EXAM: CTA of the Chest with contrast for PE 09/12/2024 02:33:48 AM TECHNIQUE: CTA of the chest was performed after the administration of 75 mL iohexol  (OMNIPAQUE ) 350 MG/ML injection. Multiplanar reformatted images are provided for review. MIP images are provided for review. Automated exposure control, iterative reconstruction, and/or weight based adjustment of the mA/kV was utilized to reduce the radiation dose to as low as reasonably achievable. COMPARISON: Chest x-ray from the previous day. CLINICAL HISTORY: Chest pain FINDINGS: PULMONARY ARTERIES: No pulmonary embolism. Main pulmonary artery is normal in caliber. The pulmonary artery shows a normal branching pattern. MEDIASTINUM: The heart and pericardium demonstrate no acute abnormality. Coronary calcifications are noted. Atherosclerotic calcifications of the thoracic aorta are noted without an aneurysmal dilatation. The degree of enhancement is limited with regards to evaluation for dissection. The thoracic inlet is within normal limits. The esophagus as visualized is within normal limits. LYMPH NODES: No mediastinal, hilar or axillary lymphadenopathy. LUNGS AND PLEURA: The lungs are well aerated bilaterally. No focal infiltrate or pulmonary edema. No pleural  effusion or pneumothorax. A few scattered calcified granulomas are noted. UPPER ABDOMEN: The visualized upper abdomen shows no acute abnormality. SOFT TISSUES AND BONES: No acute bone or soft tissue abnormality. IMPRESSION: 1. No pulmonary embolism. 2. Aortic  atherosclerosis Electronically signed by: Oneil Devonshire MD 09/12/2024 02:48 AM EST RP Workstation: [id number]   DG Chest Portable 1 View Result Date: 09/11/2024 EXAM: 1 VIEW(S) XRAY OF THE CHEST 09/11/2024 10:18:58 PM COMPARISON: 04/10/2021 CLINICAL HISTORY: CP FINDINGS: LUNGS AND PLEURA: Mild patchy left lower lobe opacity, atelectasis versus pneumonia. Small left pleural effusion. No pneumothorax. HEART AND MEDIASTINUM: Thoracic atherosclerosis. No acute abnormality of the cardiac and mediastinal silhouettes. BONES AND SOFT TISSUES: No acute osseous abnormality. IMPRESSION: 1. Mild patchy left lower lobe opacity, atelectasis versus pneumonia. 2. Small left pleural effusion. Electronically signed by: Pinkie Pebbles MD 09/11/2024 10:25 PM EST RP Workstation: [id number]     Medications:    heparin  800 Units/hr (09/12/24 1153)    aspirin  EC  81 mg Oral Daily   atorvastatin   40 mg Oral Daily   Chlorhexidine  Gluconate Cloth  6 each Topical Q0600   metoprolol  succinate  25 mg Oral Daily   acetaminophen , albuterol , hydrALAZINE , nitroGLYCERIN , ondansetron  (ZOFRAN ) IV, oxyCODONE -acetaminophen   Assessment/ Plan:  83 y.o. female with past medical history of ESRD on HD MWF, hypertension, lipidemia, gout, GERD, anemia chronic kidney disease, secondary hyperparathyroidism, gout who presented with chest pain, weakness after missing dialysis x 1 week.  [facility] nephrology/[facility] /MWF/left upper extremity AV fistula  1.  ESRD on HD MWF.  Upon presentation patient had missed 1 week of hemodialysis as she was vacationing in [place].  Came in with chest pain and found to have severe hyperkalemia with potassium of 7.1.  Patient did undergo urgent dialysis shortly after admission.  Potassium now normalized.  We will plan for hemodialysis treatment again tomorrow.  2.  Hyperkalemia.  Serum potassium 7.1 at time of admission.  Potassium down to 4.1.  Continue to monitor.  3.  Anemia of chronic kidney  disease.  Follow CBC during the course of hospitalization.  4.  Secondary hyperparathyroidism.  Monitor bone metabolism parameters over the course of the hospitalization.   LOS: 0 Edison Wollschlager 12/14/202512:51 PM  Blood cell

## 2024-09-12 NOTE — ED Notes (Signed)
 This tech assisted pt to ambulate to the restroom at this time.

## 2024-09-12 NOTE — ED Notes (Signed)
 Pt moved onto hospital bed from ED stretcher. Tolerated well.

## 2024-09-12 NOTE — Progress Notes (Addendum)
 ANTICOAGULATION CONSULT NOTE  Pharmacy Consult for heparin  infusion Indication: ACS/STEMI  Allergies[1]  Patient Measurements: Height: 4' 9 (144.8 cm) Weight: 63.1 kg (139 lb 3.2 oz) IBW/kg (Calculated) : 38.6 HEPARIN  DW (KG): 52.7  Labs: Recent Labs    09/11/24 2130 09/12/24 0240 09/12/24 1032  HGB 10.9*  --   --   HCT 33.7*  --   --   PLT 178  --   --   APTT  --  35  --   LABPROT  --  14.2  --   INR  --  1.0  --   HEPARINUNFRC  --   --  0.26*  CREATININE 11.30* 11.40* 5.72*    Estimated Creatinine Clearance: 5.8 mL/min (A) (by C-G formula based on SCr of 5.72 mg/dL (H)).   Medical History: Past Medical History:  Diagnosis Date   Arthritis    Chronic kidney disease    renal insufficiency   Complication of anesthesia    Vital signs low during colonoscopy had trouble to wake up    Elevated lipids    GERD (gastroesophageal reflux disease)    Glaucoma    Gout    HLD (hyperlipidemia)    Hypertension     Assessment: Pt is a 83 yo female presenting to ED c/o CP, weakness, & HA, found with elevated Troponin level, trending up.  1214 1032 HL 0.26, subthera; 700 un/hr  Goal of Therapy:  Heparin  level 0.3-0.7 units/ml Monitor platelets by anticoagulation protocol: Yes   Plan:  --Heparin  1000 unit IV bolus and increase heparin  infusion rate to 800 units/hr --Check HL 8 hours from rate change --Daily CBC per protocol while on IV heparin   Leah Davies Mare 09/12/2024 11:41 AM     [1]  Allergies Allergen Reactions   Amlodipine  Hypertension, Other (See Comments) and Rash    Caused increased blood pressure  Other reaction(s): kidney failure Caused increased blood pressure    Other reaction(s): Unknown Other reaction(s): Unknown    Caused increased blood pressure   Azithromycin  Other (See Comments)    Unknown  Other reaction(s): Unknown Unknown    unknown    Unknown   Prednisone Other (See Comments)    Pt states she feels like her BP dropped,  got dizzy, and very pale.   Tizanidine Other (See Comments)    Dizziness, pt states BP dropped, and very pale.

## 2024-09-13 ENCOUNTER — Encounter: Payer: Self-pay | Admitting: Internal Medicine

## 2024-09-13 ENCOUNTER — Observation Stay
Admit: 2024-09-13 | Discharge: 2024-09-13 | Disposition: A | Discharge status: Home / Self Care | Attending: Cardiology | Admitting: Cardiology

## 2024-09-13 DIAGNOSIS — Z6830 Body mass index (BMI) 30.0-30.9, adult: Secondary | ICD-10-CM | POA: Diagnosis not present

## 2024-09-13 DIAGNOSIS — K219 Gastro-esophageal reflux disease without esophagitis: Secondary | ICD-10-CM | POA: Diagnosis present

## 2024-09-13 DIAGNOSIS — I25110 Atherosclerotic heart disease of native coronary artery with unstable angina pectoris: Secondary | ICD-10-CM | POA: Diagnosis not present

## 2024-09-13 DIAGNOSIS — E875 Hyperkalemia: Secondary | ICD-10-CM | POA: Diagnosis present

## 2024-09-13 DIAGNOSIS — E7800 Pure hypercholesterolemia, unspecified: Secondary | ICD-10-CM | POA: Diagnosis not present

## 2024-09-13 DIAGNOSIS — Z91158 Patient's noncompliance with renal dialysis for other reason: Secondary | ICD-10-CM | POA: Diagnosis not present

## 2024-09-13 DIAGNOSIS — E785 Hyperlipidemia, unspecified: Secondary | ICD-10-CM | POA: Diagnosis present

## 2024-09-13 DIAGNOSIS — R410 Disorientation, unspecified: Secondary | ICD-10-CM | POA: Diagnosis not present

## 2024-09-13 DIAGNOSIS — I452 Bifascicular block: Secondary | ICD-10-CM | POA: Diagnosis present

## 2024-09-13 DIAGNOSIS — I120 Hypertensive chronic kidney disease with stage 5 chronic kidney disease or end stage renal disease: Secondary | ICD-10-CM | POA: Diagnosis present

## 2024-09-13 DIAGNOSIS — Z992 Dependence on renal dialysis: Secondary | ICD-10-CM | POA: Diagnosis not present

## 2024-09-13 DIAGNOSIS — Z79899 Other long term (current) drug therapy: Secondary | ICD-10-CM | POA: Diagnosis not present

## 2024-09-13 DIAGNOSIS — I10 Essential (primary) hypertension: Secondary | ICD-10-CM | POA: Diagnosis not present

## 2024-09-13 DIAGNOSIS — Z9861 Coronary angioplasty status: Secondary | ICD-10-CM | POA: Diagnosis not present

## 2024-09-13 DIAGNOSIS — E669 Obesity, unspecified: Secondary | ICD-10-CM | POA: Diagnosis present

## 2024-09-13 DIAGNOSIS — N186 End stage renal disease: Secondary | ICD-10-CM | POA: Diagnosis present

## 2024-09-13 DIAGNOSIS — N2581 Secondary hyperparathyroidism of renal origin: Secondary | ICD-10-CM | POA: Diagnosis present

## 2024-09-13 DIAGNOSIS — Z743 Need for continuous supervision: Secondary | ICD-10-CM | POA: Diagnosis not present

## 2024-09-13 DIAGNOSIS — H409 Unspecified glaucoma: Secondary | ICD-10-CM | POA: Diagnosis present

## 2024-09-13 DIAGNOSIS — Z803 Family history of malignant neoplasm of breast: Secondary | ICD-10-CM | POA: Diagnosis not present

## 2024-09-13 DIAGNOSIS — M1990 Unspecified osteoarthritis, unspecified site: Secondary | ICD-10-CM | POA: Diagnosis present

## 2024-09-13 DIAGNOSIS — E8721 Acute metabolic acidosis: Secondary | ICD-10-CM | POA: Diagnosis present

## 2024-09-13 DIAGNOSIS — I4510 Unspecified right bundle-branch block: Secondary | ICD-10-CM | POA: Diagnosis present

## 2024-09-13 DIAGNOSIS — I214 Non-ST elevation (NSTEMI) myocardial infarction: Secondary | ICD-10-CM | POA: Diagnosis not present

## 2024-09-13 DIAGNOSIS — I2510 Atherosclerotic heart disease of native coronary artery without angina pectoris: Secondary | ICD-10-CM | POA: Diagnosis present

## 2024-09-13 DIAGNOSIS — Z7982 Long term (current) use of aspirin: Secondary | ICD-10-CM | POA: Diagnosis not present

## 2024-09-13 DIAGNOSIS — I959 Hypotension, unspecified: Secondary | ICD-10-CM | POA: Diagnosis not present

## 2024-09-13 DIAGNOSIS — R531 Weakness: Secondary | ICD-10-CM | POA: Diagnosis not present

## 2024-09-13 DIAGNOSIS — D631 Anemia in chronic kidney disease: Secondary | ICD-10-CM | POA: Diagnosis present

## 2024-09-13 LAB — CBC
HCT: 32.2 % — ABNORMAL LOW (ref 36.0–46.0)
Hemoglobin: 10.4 g/dL — ABNORMAL LOW (ref 12.0–15.0)
MCH: 30.2 pg (ref 26.0–34.0)
MCHC: 32.3 g/dL (ref 30.0–36.0)
MCV: 93.6 fL (ref 80.0–100.0)
Platelets: 202 K/uL (ref 150–400)
RBC: 3.44 MIL/uL — ABNORMAL LOW (ref 3.87–5.11)
RDW: 13.1 % (ref 11.5–15.5)
WBC: 5.9 K/uL (ref 4.0–10.5)
nRBC: 0 % (ref 0.0–0.2)

## 2024-09-13 LAB — RENAL FUNCTION PANEL
Albumin: 4.1 g/dL (ref 3.5–5.0)
Anion gap: 15 (ref 5–15)
BUN: 18 mg/dL (ref 8–23)
CO2: 26 mmol/L (ref 22–32)
Calcium: 9.1 mg/dL (ref 8.9–10.3)
Chloride: 93 mmol/L — ABNORMAL LOW (ref 98–111)
Creatinine, Ser: 3.37 mg/dL — ABNORMAL HIGH (ref 0.44–1.00)
GFR, Estimated: 13 mL/min — ABNORMAL LOW (ref >60)
Glucose, Bld: 76 mg/dL (ref 70–99)
Phosphorus: 3.5 mg/dL (ref 2.5–4.6)
Potassium: 4 mmol/L (ref 3.5–5.1)
Sodium: 134 mmol/L — ABNORMAL LOW (ref 135–145)

## 2024-09-13 LAB — BASIC METABOLIC PANEL WITH GFR
Anion gap: 18 — ABNORMAL HIGH (ref 5–15)
BUN: 50 mg/dL — ABNORMAL HIGH (ref 8–23)
CO2: 25 mmol/L (ref 22–32)
Calcium: 9 mg/dL (ref 8.9–10.3)
Chloride: 93 mmol/L — ABNORMAL LOW (ref 98–111)
Creatinine, Ser: 7.23 mg/dL — ABNORMAL HIGH (ref 0.44–1.00)
GFR, Estimated: 5 mL/min — ABNORMAL LOW (ref >60)
Glucose, Bld: 82 mg/dL (ref 70–99)
Potassium: 4.7 mmol/L (ref 3.5–5.1)
Sodium: 135 mmol/L (ref 135–145)

## 2024-09-13 LAB — HEPARIN LEVEL (UNFRACTIONATED)
Heparin Unfractionated: 0.3 [IU]/mL (ref 0.30–0.70)
Heparin Unfractionated: 0.33 [IU]/mL (ref 0.30–0.70)

## 2024-09-13 MED ORDER — PERFLUTREN LIPID MICROSPHERE
1.0000 mL | INTRAVENOUS | Status: AC | PRN
  Administered 2024-09-13: 10:00:00 2 mL via INTRAVENOUS

## 2024-09-13 NOTE — Progress Notes (Signed)
°   09/13/24 1400  Vitals  Temp (!) 97.4 F (36.3 C)  Temp Source Oral  BP (!) 138/42  MAP (mmHg) 70  BP Location Right Arm  BP Method Automatic  Patient Position (if appropriate) Lying  Pulse Rate (!) 57  Pulse Rate Source Monitor  ECG Heart Rate (!) 56  Resp 11  Oxygen Therapy  SpO2 98 %  O2 Device Room Air  Patient Activity (if Appropriate) In bed  Pulse Oximetry Type Continuous  Oximetry Probe Site Changed No  During Treatment Monitoring  Blood Flow Rate (mL/min) 400 mL/min  Arterial Pressure (mmHg) -210.29 mmHg  Venous Pressure (mmHg) 218.37 mmHg  TMP (mmHg) 7.88 mmHg  Ultrafiltration Rate (mL/min) 515 mL/min  Dialysate Flow Rate (mL/min) 300 ml/min  Dialysate Potassium Concentration 3  Dialysate Calcium  Concentration 2.5  Duration of HD Treatment -hour(s) 3.5 hour(s)  Cumulative Fluid Removed (mL) per Treatment  1300.06  HD Safety Checks Performed Yes  Intra-Hemodialysis Comments Tx completed  Post Treatment  Dialyzer Clearance Lightly streaked  Hemodialysis Intake (mL) 0 mL  Liters Processed 84  Fluid Removed (mL) 1300 mL  Tolerated HD Treatment Yes  Fistula / Graft Left Arteriovenous fistula  Placement Date/Time: 07/28/20 1018   Placed prior to admission: Yes  Orientation: Left  Access Type: Arteriovenous fistula  Site Condition No complications  Fistula / Graft Assessment Present;Thrill;Bruit  Status Deaccessed;Flushed;Patent  Drainage Description None   Pt tolerated tx well. Pt removed 1.3 L. Vs wnl.

## 2024-09-13 NOTE — Progress Notes (Signed)
 ANTICOAGULATION CONSULT NOTE  Pharmacy Consult for heparin  infusion Indication: ACS/STEMI  Allergies[1]  Patient Measurements: Height: 4' 9 (144.8 cm) Weight: 60 kg (132 lb 4.4 oz) IBW/kg (Calculated) : 38.6 HEPARIN  DW (KG): 52.7  Labs: Recent Labs    09/11/24 2130 09/12/24 0240 09/12/24 1032 09/12/24 1032 09/12/24 2216 09/13/24 0627 09/13/24 1613  HGB 10.9*  --   --   --   --  10.4*  --   HCT 33.7*  --   --   --   --  32.2*  --   PLT 178  --   --   --   --  202  --   APTT  --  35  --   --   --   --   --   LABPROT  --  14.2  --   --   --   --   --   INR  --  1.0  --   --   --   --   --   HEPARINUNFRC  --   --  0.26*   < > 0.35 0.30 0.33  CREATININE 11.30* 11.40* 5.72*  --   --  7.23*  --    < > = values in this interval not displayed.    Estimated Creatinine Clearance: 4.5 mL/min (A) (by C-G formula based on SCr of 7.23 mg/dL (H)).   Medical History: Past Medical History:  Diagnosis Date   Arthritis    Chronic kidney disease    renal insufficiency   Complication of anesthesia    Vital signs low during colonoscopy had trouble to wake up    Elevated lipids    GERD (gastroesophageal reflux disease)    Glaucoma    Gout    HLD (hyperlipidemia)    Hypertension     Assessment: Pt is a 83 yo female presenting to ED c/o CP, weakness, & HA, found with elevated Troponin level, trending up.  1214 1032 HL 0.26, subthera; 700 un/hr 1214 2216 HL 0.35, therapeutic x 1 1215 0627 HL 0.30, barely therapeutic 1212 1613 HL 0.33, therapeutic x 3  Goal of Therapy:  Heparin  level 0.3-0.7 units/ml Monitor platelets by anticoagulation protocol: Yes   Plan:  --Continue heparin  infusion rate at 850 units/hr --Check HL daily while therapeutic --Daily CBC per protocol while on IV heparin   Kayla JULIANNA Blew, PharmD 09/13/2024 5:00 PM         [1]  Allergies Allergen Reactions   Amlodipine  Hypertension, Other (See Comments) and Rash    Caused increased blood  pressure  Other reaction(s): kidney failure Caused increased blood pressure    Other reaction(s): Unknown Other reaction(s): Unknown    Caused increased blood pressure   Azithromycin  Other (See Comments)    Unknown  Other reaction(s): Unknown Unknown    unknown    Unknown   Prednisone Other (See Comments)    Pt states she feels like her BP dropped, got dizzy, and very pale.   Tizanidine Other (See Comments)    Dizziness, pt states BP dropped, and very pale.

## 2024-09-13 NOTE — Progress Notes (Signed)
 ANTICOAGULATION CONSULT NOTE  Pharmacy Consult for heparin  infusion Indication: ACS/STEMI  Allergies[1]  Patient Measurements: Height: 4' 9 (144.8 cm) Weight: 63.1 kg (139 lb 3.2 oz) IBW/kg (Calculated) : 38.6 HEPARIN  DW (KG): 52.7  Labs: Recent Labs    09/11/24 2130 09/12/24 0240 09/12/24 1032 09/12/24 2216 09/13/24 0627  HGB 10.9*  --   --   --  10.4*  HCT 33.7*  --   --   --  32.2*  PLT 178  --   --   --  202  APTT  --  35  --   --   --   LABPROT  --  14.2  --   --   --   INR  --  1.0  --   --   --   HEPARINUNFRC  --   --  0.26* 0.35 0.30  CREATININE 11.30* 11.40* 5.72*  --   --     Estimated Creatinine Clearance: 5.8 mL/min (A) (by C-G formula based on SCr of 5.72 mg/dL (H)).   Medical History: Past Medical History:  Diagnosis Date   Arthritis    Chronic kidney disease    renal insufficiency   Complication of anesthesia    Vital signs low during colonoscopy had trouble to wake up    Elevated lipids    GERD (gastroesophageal reflux disease)    Glaucoma    Gout    HLD (hyperlipidemia)    Hypertension     Assessment: Pt is a 83 yo female presenting to ED c/o CP, weakness, & HA, found with elevated Troponin level, trending up.  1214 1032 HL 0.26, subthera; 700 un/hr 1214 2216 HL 0.35, therapeutic x 1 1215 0627 HL 0.30, barely therapeutic  Goal of Therapy:  Heparin  level 0.3-0.7 units/ml Monitor platelets by anticoagulation protocol: Yes   Plan:  --Increase heparin  infusion rate to 850 units/hr --Recheck HL 8 hours in 8 hrs to reconfirm --Daily CBC per protocol while on IV heparin   Rankin CANDIE Dills, PharmD, [facility] 09/13/2024 6:59 AM        [1]  Allergies Allergen Reactions   Amlodipine  Hypertension, Other (See Comments) and Rash    Caused increased blood pressure  Other reaction(s): kidney failure Caused increased blood pressure    Other reaction(s): Unknown Other reaction(s): Unknown    Caused increased blood pressure   Azithromycin   Other (See Comments)    Unknown  Other reaction(s): Unknown Unknown    unknown    Unknown   Prednisone Other (See Comments)    Pt states she feels like her BP dropped, got dizzy, and very pale.   Tizanidine Other (See Comments)    Dizziness, pt states BP dropped, and very pale.

## 2024-09-13 NOTE — Progress Notes (Signed)
 PROGRESS NOTE    Leah Davies  FMW:[id number] DOB: 1941/08/21 DOA: 09/11/2024 PCP: Sherial Bail, MD   Assessment & Plan:   Principal Problem:   Hyperkalemia Active Problems:   ESRD (end stage renal disease) (HCC)   Chest pain   NSTEMI (non-ST elevated myocardial infarction) (HCC)   Essential hypertension   GERD (gastroesophageal reflux disease)   Anemia in ESRD (end-stage renal disease) (HCC)  Assessment and Plan:  Hyperkalemia: secondary to pt missing HD x 1 week. S/p lokelma . WNL again today   ESRD: on HD MWF. Missed HD x 1 week. Nephro following and recs apprec    NSTEMI: as per cardio. Troponins are trending up. Cardiac cath tomorrow as per cardio. Cardio following and recs apprec   Elevated D-dimer: CTA chest neg for PE    HTN: continue on nifedipine , losartan     GERD: continue on PPI    ACD: likely secondary to ESRD. No need for a transfusion currently   Obesity: BMI 30.1. Would benefit from weight loss     DVT prophylaxis: heparin  drip  Code Status: full  Family Communication:  Disposition Plan: depends on PT/OT recs (not consulted yet)   Level of care: Telemetry  Status is: Inpatient Remains inpatient appropriate because: severity of illness, cardiac cath tomorrow     Consultants:  Cardio   Procedures:  Antimicrobials:    Subjective: Pt c/o fatigue   Objective: Vitals:   09/12/24 2201 09/13/24 0433 09/13/24 0500 09/13/24 0857  BP: (!) 146/53 (!) 117/54  (!) 124/46  Pulse: 64 63  64  Resp: 18 18  16   Temp: 98.4 F (36.9 C)   97.9 F (36.6 C)  TempSrc:    Oral  SpO2: 97% 92%  92%  Weight:   63.1 kg   Height:       No intake or output data in the 24 hours ending 09/13/24 0951  Filed Weights   09/12/24 0101 09/13/24 0500  Weight: 63.1 kg 63.1 kg    Examination:  General exam: appears comfortable  Respiratory system: clear breath sounds b/l  Cardiovascular system: S1/S2+. No rubs or clicks  Gastrointestinal system: abd  is soft, NT, obese & hypoactive bowel sounds Central nervous system: alert & awake. Moves all extremities  Psychiatry: Judgement and insight appears at baseline. Appropriate mood and affect    Data Reviewed: I have personally reviewed following labs and imaging studies  CBC: Recent Labs  Lab 09/11/24 2130 09/13/24 0627  WBC 6.5 5.9  NEUTROABS 4.8  --   HGB 10.9* 10.4*  HCT 33.7* 32.2*  MCV 94.4 93.6  PLT 178 202   Basic Metabolic Panel: Recent Labs  Lab 09/11/24 2130 09/12/24 0240 09/12/24 1032 09/13/24 0627  NA 134* 136 136 135  K 7.1* 6.2* 4.1 4.7  CL 97* 99 93* 93*  CO2 15* 15* 25 25  GLUCOSE 105* 91 84 82  BUN 110* 108* 40* 50*  CREATININE 11.30* 11.40* 5.72* 7.23*  CALCIUM  9.6 9.7 9.4 9.0   GFR: Estimated Creatinine Clearance: 4.6 mL/min (A) (by C-G formula based on SCr of 7.23 mg/dL (H)). Liver Function Tests: Recent Labs  Lab 09/11/24 2130  AST 16  ALT 10  ALKPHOS 72  BILITOT 0.3  PROT 7.9  ALBUMIN 4.4   No results for input(s): LIPASE, AMYLASE in the last 168 hours. No results for input(s): AMMONIA in the last 168 hours. Coagulation Profile: Recent Labs  Lab 09/12/24 0240  INR 1.0   Cardiac Enzymes: No results  for input(s): CKTOTAL, CKMB, CKMBINDEX, TROPONINI in the last 168 hours. BNP (last 3 results) No results for input(s): PROBNP in the last 8760 hours. HbA1C: No results for input(s): HGBA1C in the last 72 hours. CBG: Recent Labs  Lab 09/12/24 0425 09/12/24 0535  GLUCAP 97 97   Lipid Profile: No results for input(s): CHOL, HDL, LDLCALC, TRIG, CHOLHDL, LDLDIRECT in the last 72 hours. Thyroid  Function Tests: No results for input(s): TSH, T4TOTAL, FREET4, T3FREE, THYROIDAB in the last 72 hours. Anemia Panel: No results for input(s): VITAMINB12, FOLATE, FERRITIN, TIBC, IRON, RETICCTPCT in the last 72 hours. Sepsis Labs: No results for input(s): PROCALCITON, LATICACIDVEN in the  last 168 hours.  No results found for this or any previous visit (from the past 240 hours).       Radiology Studies: US  Venous Img Lower Bilateral (DVT) Result Date: 09/12/2024 EXAM: ULTRASOUND DUPLEX OF THE BILATERAL LOWER EXTREMITY VEINS TECHNIQUE: Duplex ultrasound using B-mode/gray scaled imaging and Doppler spectral analysis and color flow was obtained of the deep venous structures of the bilateral lower extremity. COMPARISON: None available. CLINICAL HISTORY: 220372 Positive D dimer [id number] Positive D dimer FINDINGS: The common femoral veins, femoral veins, popliteal veins, and posterior tibial veins demonstrate normal compressibility with normal color flow and spectral analysis. IMPRESSION: 1. No evidence of DVT. Electronically signed by: Francis Quam MD 09/12/2024 06:50 AM EST RP Workstation: [id number]   CT Angio Chest Pulmonary Embolism (PE) W or WO Contrast Result Date: 09/12/2024 EXAM: CTA of the Chest with contrast for PE 09/12/2024 02:33:48 AM TECHNIQUE: CTA of the chest was performed after the administration of 75 mL iohexol  (OMNIPAQUE ) 350 MG/ML injection. Multiplanar reformatted images are provided for review. MIP images are provided for review. Automated exposure control, iterative reconstruction, and/or weight based adjustment of the mA/kV was utilized to reduce the radiation dose to as low as reasonably achievable. COMPARISON: Chest x-ray from the previous day. CLINICAL HISTORY: Chest pain FINDINGS: PULMONARY ARTERIES: No pulmonary embolism. Main pulmonary artery is normal in caliber. The pulmonary artery shows a normal branching pattern. MEDIASTINUM: The heart and pericardium demonstrate no acute abnormality. Coronary calcifications are noted. Atherosclerotic calcifications of the thoracic aorta are noted without an aneurysmal dilatation. The degree of enhancement is limited with regards to evaluation for dissection. The thoracic inlet is within normal limits. The esophagus as  visualized is within normal limits. LYMPH NODES: No mediastinal, hilar or axillary lymphadenopathy. LUNGS AND PLEURA: The lungs are well aerated bilaterally. No focal infiltrate or pulmonary edema. No pleural effusion or pneumothorax. A few scattered calcified granulomas are noted. UPPER ABDOMEN: The visualized upper abdomen shows no acute abnormality. SOFT TISSUES AND BONES: No acute bone or soft tissue abnormality. IMPRESSION: 1. No pulmonary embolism. 2. Aortic atherosclerosis Electronically signed by: Oneil Devonshire MD 09/12/2024 02:48 AM EST RP Workstation: [id number]   DG Chest Portable 1 View Result Date: 09/11/2024 EXAM: 1 VIEW(S) XRAY OF THE CHEST 09/11/2024 10:18:58 PM COMPARISON: 04/10/2021 CLINICAL HISTORY: CP FINDINGS: LUNGS AND PLEURA: Mild patchy left lower lobe opacity, atelectasis versus pneumonia. Small left pleural effusion. No pneumothorax. HEART AND MEDIASTINUM: Thoracic atherosclerosis. No acute abnormality of the cardiac and mediastinal silhouettes. BONES AND SOFT TISSUES: No acute osseous abnormality. IMPRESSION: 1. Mild patchy left lower lobe opacity, atelectasis versus pneumonia. 2. Small left pleural effusion. Electronically signed by: Pinkie Pebbles MD 09/11/2024 10:25 PM EST RP Workstation: [id number]        Scheduled Meds:  aspirin  EC  81 mg Oral Daily   atorvastatin   40  mg Oral Daily   Chlorhexidine  Gluconate Cloth  6 each Topical Q0600   metoprolol  succinate  25 mg Oral Daily   scopolamine   1 patch Transdermal Q72H   Continuous Infusions:  heparin  850 Units/hr (09/13/24 0713)     LOS: 0 days     Anthony CHRISTELLA Pouch, MD [facility] Pager [phone]  If 7PM-7AM, please contact night-coverage [email or web address] 09/13/2024, 9:51 AM

## 2024-09-13 NOTE — Progress Notes (Signed)
 [facility]  ROUNDING NOTE   Subjective:   Patient under the care of of [facility] nephrology.  She dialyzes on MWF schedule at [facility].  Has missed dialysis x 1 week as she was visiting [place].  She presented here with severe metabolic derangements including hyperkalemia with potassium of 7.1, acute metabolic acidosis with serum bicarbonate of 15 and azotemia with a BUN of 110.  Update: Cardiac catheterization was planned for today however patient quite fatigued today and did not want to undergo this today.  Therefore we are planning for hemodialysis treatment today.  Cardiac catheterization rescheduled for tomorrow.  Objective:  Vital signs in last 24 hours:  Temp:  [97.9 F (36.6 C)-98.4 F (36.9 C)] 98.4 F (36.9 C) (12/14 2201) Pulse Rate:  [49-89] 63 (12/15 0433) Resp:  [11-18] 18 (12/15 0433) BP: (117-219)/(48-93) 117/54 (12/15 0433) SpO2:  [92 %-100 %] 92 % (12/15 0433) Weight:  [63.1 kg] 63.1 kg (12/15 0500)  Weight change: 0 kg Filed Weights   09/12/24 0101 09/13/24 0500  Weight: 63.1 kg 63.1 kg    Intake/Output: I/O last 3 completed shifts: In: -  Out: 1500 [Other:1500]   Intake/Output this shift:  No intake/output data recorded.  Physical Exam: General: No acute distress  Head: Normocephalic, atraumatic. Moist oral mucosal membranes  Neck: Supple  Lungs:  Clear to auscultation, normal effort  Heart: S1S2 no rubs  Abdomen:  Soft, nontender, bowel sounds present  Extremities: Trace peripheral edema.  Neurologic: Awake, alert, following commands  Skin: No acute rash  Access: Left upper extremity AV fistula    Basic Metabolic Panel: Recent Labs  Lab 09/11/24 2130 09/12/24 0240 09/12/24 1032 09/13/24 0627  NA 134* 136 136 135  K 7.1* 6.2* 4.1 4.7  CL 97* 99 93* 93*  CO2 15* 15* 25 25  GLUCOSE 105* 91 84 82  BUN 110* 108* 40* 50*  CREATININE 11.30* 11.40* 5.72* 7.23*  CALCIUM  9.6 9.7 9.4 9.0    Liver Function Tests: Recent  Labs  Lab 09/11/24 2130  AST 16  ALT 10  ALKPHOS 72  BILITOT 0.3  PROT 7.9  ALBUMIN 4.4   No results for input(s): LIPASE, AMYLASE in the last 168 hours. No results for input(s): AMMONIA in the last 168 hours.  CBC: Recent Labs  Lab 09/11/24 2130 09/13/24 0627  WBC 6.5 5.9  NEUTROABS 4.8  --   HGB 10.9* 10.4*  HCT 33.7* 32.2*  MCV 94.4 93.6  PLT 178 202    Cardiac Enzymes: No results for input(s): CKTOTAL, CKMB, CKMBINDEX, TROPONINI in the last 168 hours.  BNP: Invalid input(s): POCBNP  CBG: Recent Labs  Lab 09/12/24 0425 09/12/24 0535  GLUCAP 97 97    Microbiology: Results for orders placed or performed during the hospital encounter of 04/10/21  Resp Panel by RT-PCR (Flu A&B, Covid) Nasopharyngeal Swab     Status: None   Collection Time: 04/10/21 11:56 AM   Specimen: Nasopharyngeal Swab; Nasopharyngeal(NP) swabs in vial transport medium  Result Value Ref Range Status   SARS Coronavirus 2 by RT PCR NEGATIVE NEGATIVE Final    Comment: (NOTE) SARS-CoV-2 target nucleic acids are NOT DETECTED.  The SARS-CoV-2 RNA is generally detectable in upper respiratory specimens during the acute phase of infection. The lowest concentration of SARS-CoV-2 viral copies this assay can detect is 138 copies/mL. A negative result does not preclude SARS-Cov-2 infection and should not be used as the sole basis for treatment or other patient management decisions.  A negative result may occur with  improper specimen collection/handling, submission of specimen other than nasopharyngeal swab, presence of viral mutation(s) within the areas targeted by this assay, and inadequate number of viral copies(<138 copies/mL). A negative result must be combined with clinical observations, patient history, and epidemiological information. The expected result is Negative.  Fact Sheet for Patients:  bloggercourse.com  Fact Sheet for Healthcare Providers:   seriousbroker.it  This test is no t yet approved or cleared by the [place]  FDA and  has been authorized for detection and/or diagnosis of SARS-CoV-2 by FDA under an Emergency Use Authorization (EUA). This EUA will remain  in effect (meaning this test can be used) for the duration of the COVID-19 declaration under Section 564(b)(1) of the Act, 21 U.S.C.section 360bbb-3(b)(1), unless the authorization is terminated  or revoked sooner.       Influenza A by PCR NEGATIVE NEGATIVE Final   Influenza B by PCR NEGATIVE NEGATIVE Final    Comment: (NOTE) The Xpert Xpress SARS-CoV-2/FLU/RSV plus assay is intended as an aid in the diagnosis of influenza from Nasopharyngeal swab specimens and should not be used as a sole basis for treatment. Nasal washings and aspirates are unacceptable for Xpert Xpress SARS-CoV-2/FLU/RSV testing.  Fact Sheet for Patients: bloggercourse.com  Fact Sheet for Healthcare Providers: seriousbroker.it  This test is not yet approved or cleared by the [place]  FDA and has been authorized for detection and/or diagnosis of SARS-CoV-2 by FDA under an Emergency Use Authorization (EUA). This EUA will remain in effect (meaning this test can be used) for the duration of the COVID-19 declaration under Section 564(b)(1) of the Act, 21 U.S.C. section 360bbb-3(b)(1), unless the authorization is terminated or revoked.  Performed at [facility], [address] [place], [place] [address]     Coagulation Studies: Recent Labs    09/12/24 0240  LABPROT 14.2  INR 1.0    Urinalysis: No results for input(s): COLORURINE, LABSPEC, PHURINE, GLUCOSEU, HGBUR, BILIRUBINUR, KETONESUR, PROTEINUR, UROBILINOGEN, NITRITE, LEUKOCYTESUR in the last 72 hours.  Invalid input(s): APPERANCEUR    Imaging: US  Venous Img Lower Bilateral (DVT) Result Date:  09/12/2024 EXAM: ULTRASOUND DUPLEX OF THE BILATERAL LOWER EXTREMITY VEINS TECHNIQUE: Duplex ultrasound using B-mode/gray scaled imaging and Doppler spectral analysis and color flow was obtained of the deep venous structures of the bilateral lower extremity. COMPARISON: None available. CLINICAL HISTORY: 220372 Positive D dimer [id number] Positive D dimer FINDINGS: The common femoral veins, femoral veins, popliteal veins, and posterior tibial veins demonstrate normal compressibility with normal color flow and spectral analysis. IMPRESSION: 1. No evidence of DVT. Electronically signed by: Francis Quam MD 09/12/2024 06:50 AM EST RP Workstation: [id number]   CT Angio Chest Pulmonary Embolism (PE) W or WO Contrast Result Date: 09/12/2024 EXAM: CTA of the Chest with contrast for PE 09/12/2024 02:33:48 AM TECHNIQUE: CTA of the chest was performed after the administration of 75 mL iohexol  (OMNIPAQUE ) 350 MG/ML injection. Multiplanar reformatted images are provided for review. MIP images are provided for review. Automated exposure control, iterative reconstruction, and/or weight based adjustment of the mA/kV was utilized to reduce the radiation dose to as low as reasonably achievable. COMPARISON: Chest x-ray from the previous day. CLINICAL HISTORY: Chest pain FINDINGS: PULMONARY ARTERIES: No pulmonary embolism. Main pulmonary artery is normal in caliber. The pulmonary artery shows a normal branching pattern. MEDIASTINUM: The heart and pericardium demonstrate no acute abnormality. Coronary calcifications are noted. Atherosclerotic calcifications of the thoracic aorta are noted without an aneurysmal dilatation. The degree of enhancement  is limited with regards to evaluation for dissection. The thoracic inlet is within normal limits. The esophagus as visualized is within normal limits. LYMPH NODES: No mediastinal, hilar or axillary lymphadenopathy. LUNGS AND PLEURA: The lungs are well aerated bilaterally. No focal infiltrate  or pulmonary edema. No pleural effusion or pneumothorax. A few scattered calcified granulomas are noted. UPPER ABDOMEN: The visualized upper abdomen shows no acute abnormality. SOFT TISSUES AND BONES: No acute bone or soft tissue abnormality. IMPRESSION: 1. No pulmonary embolism. 2. Aortic atherosclerosis Electronically signed by: Oneil Devonshire MD 09/12/2024 02:48 AM EST RP Workstation: [id number]   DG Chest Portable 1 View Result Date: 09/11/2024 EXAM: 1 VIEW(S) XRAY OF THE CHEST 09/11/2024 10:18:58 PM COMPARISON: 04/10/2021 CLINICAL HISTORY: CP FINDINGS: LUNGS AND PLEURA: Mild patchy left lower lobe opacity, atelectasis versus pneumonia. Small left pleural effusion. No pneumothorax. HEART AND MEDIASTINUM: Thoracic atherosclerosis. No acute abnormality of the cardiac and mediastinal silhouettes. BONES AND SOFT TISSUES: No acute osseous abnormality. IMPRESSION: 1. Mild patchy left lower lobe opacity, atelectasis versus pneumonia. 2. Small left pleural effusion. Electronically signed by: Pinkie Pebbles MD 09/11/2024 10:25 PM EST RP Workstation: [id number]     Medications:    heparin  850 Units/hr (09/13/24 0713)    aspirin  EC  81 mg Oral Daily   atorvastatin   40 mg Oral Daily   Chlorhexidine  Gluconate Cloth  6 each Topical Q0600   metoprolol  succinate  25 mg Oral Daily   scopolamine   1 patch Transdermal Q72H   acetaminophen , albuterol , hydrALAZINE , nitroGLYCERIN , ondansetron  (ZOFRAN ) IV, oxyCODONE -acetaminophen   Assessment/ Plan:  83 y.o. female with past medical history of ESRD on HD MWF, hypertension, lipidemia, gout, GERD, anemia chronic kidney disease, secondary hyperparathyroidism, gout who presented with chest pain, weakness after missing dialysis x 1 week.  [facility] nephrology/[facility]/MWF/left upper extremity AV fistula  1.  ESRD on HD MWF.  Upon presentation patient had missed 1 week of hemodialysis as she was vacationing in [place].  Came in with chest pain and found to have  severe hyperkalemia with potassium of 7.1.  Patient did undergo urgent dialysis shortly after admission.  Update: Hyperkalemia improved.  Patient will undergo hemodialysis treatment per usual schedule.  2.  Hyperkalemia.  Resolved with dialysis treatment urgently upon admission.  We are planning for additional dialysis today.  3.  Anemia of chronic kidney disease.  Hemoglobin currently 10.4.  Continue to monitor for now.  4.  Secondary hyperparathyroidism.  Recheck serum phosphorus today.   LOS: 0 Cori Henningsen 12/15/20258:58 AM  Blood cell

## 2024-09-13 NOTE — Plan of Care (Signed)
  Problem: Health Behavior/Discharge Planning: Goal: Ability to manage health-related needs will improve Outcome: Progressing   Problem: Clinical Measurements: Goal: Will remain free from infection Outcome: Progressing Goal: Respiratory complications will improve Outcome: Progressing   Problem: Activity: Goal: Risk for activity intolerance will decrease Outcome: Progressing   Problem: Elimination: Goal: Will not experience complications related to bowel motility Outcome: Progressing

## 2024-09-13 NOTE — Progress Notes (Signed)
 Pt receives outpt HD at [facility] on MWF at 6:15am. Navigator following to assist with any HD needs.  Suzen Satchel Dialysis Navigator [phone]

## 2024-09-13 NOTE — Plan of Care (Signed)

## 2024-09-13 NOTE — Progress Notes (Signed)
 [facility]  SUBJECTIVE: Patient laying in bed, denies chest pain or shortness of breath,   Vitals:   09/12/24 2200 09/12/24 2201 09/13/24 0433 09/13/24 0500  BP: (!) 146/53 (!) 146/53 (!) 117/54   Pulse: 66 64 63   Resp: 18 18 18    Temp: 98.4 F (36.9 C) 98.4 F (36.9 C)    TempSrc:      SpO2: 95% 97% 92%   Weight:    63.1 kg  Height:        No intake or output data in the 24 hours ending 09/13/24 0757    PHYSICAL EXAM  General: Well developed, well nourished, in no acute distress HEENT:  Normocephalic and atramatic Neck:  No JVD.  Lungs: Clear bilaterally to auscultation and percussion. Heart: HRRR . Normal S1 and S2 without gallops or murmurs.  Abdomen: Bowel sounds are positive, abdomen soft and non-tender  Msk:  Back normal, normal gait. Normal strength and tone for age. Extremities: No clubbing, cyanosis or edema.   Neuro: Alert and oriented X 3. Psych:  Good affect, responds appropriately   LABS: Basic Metabolic Panel: Recent Labs    09/12/24 1032 09/13/24 0627  NA 136 135  K 4.1 4.7  CL 93* 93*  CO2 25 25  GLUCOSE 84 82  BUN 40* 50*  CREATININE 5.72* 7.23*  CALCIUM  9.4 9.0   Liver Function Tests: Recent Labs    09/11/24 2130  AST 16  ALT 10  ALKPHOS 72  BILITOT 0.3  PROT 7.9  ALBUMIN 4.4   No results for input(s): LIPASE, AMYLASE in the last 72 hours. CBC: Recent Labs    09/11/24 2130 09/13/24 0627  WBC 6.5 5.9  NEUTROABS 4.8  --   HGB 10.9* 10.4*  HCT 33.7* 32.2*  MCV 94.4 93.6  PLT 178 202   Cardiac Enzymes: No results for input(s): CKTOTAL, CKMB, CKMBINDEX, TROPONINI in the last 72 hours. BNP: Invalid input(s): POCBNP D-Dimer: Recent Labs    09/11/24 2353  DDIMER 0.82*   Hemoglobin A1C: No results for input(s): HGBA1C in the last 72 hours. Fasting Lipid Panel: No results for input(s): CHOL, HDL, LDLCALC, TRIG, CHOLHDL, LDLDIRECT in the last 72 hours. Thyroid  Function Tests: No results  for input(s): TSH, T4TOTAL, T3FREE, THYROIDAB in the last 72 hours.  Invalid input(s): FREET3 Anemia Panel: No results for input(s): VITAMINB12, FOLATE, FERRITIN, TIBC, IRON, RETICCTPCT in the last 72 hours.  US  Venous Img Lower Bilateral (DVT) Result Date: 09/12/2024 EXAM: ULTRASOUND DUPLEX OF THE BILATERAL LOWER EXTREMITY VEINS TECHNIQUE: Duplex ultrasound using B-mode/gray scaled imaging and Doppler spectral analysis and color flow was obtained of the deep venous structures of the bilateral lower extremity. COMPARISON: None available. CLINICAL HISTORY: 220372 Positive D dimer [id number] Positive D dimer FINDINGS: The common femoral veins, femoral veins, popliteal veins, and posterior tibial veins demonstrate normal compressibility with normal color flow and spectral analysis. IMPRESSION: 1. No evidence of DVT. Electronically signed by: Francis Quam MD 09/12/2024 06:50 AM EST RP Workstation: [id number]   CT Angio Chest Pulmonary Embolism (PE) W or WO Contrast Result Date: 09/12/2024 EXAM: CTA of the Chest with contrast for PE 09/12/2024 02:33:48 AM TECHNIQUE: CTA of the chest was performed after the administration of 75 mL iohexol  (OMNIPAQUE ) 350 MG/ML injection. Multiplanar reformatted images are provided for review. MIP images are provided for review. Automated exposure control, iterative reconstruction, and/or weight based adjustment of the mA/kV was utilized to reduce the radiation dose to as low as reasonably achievable. COMPARISON: Chest  x-ray from the previous day. CLINICAL HISTORY: Chest pain FINDINGS: PULMONARY ARTERIES: No pulmonary embolism. Main pulmonary artery is normal in caliber. The pulmonary artery shows a normal branching pattern. MEDIASTINUM: The heart and pericardium demonstrate no acute abnormality. Coronary calcifications are noted. Atherosclerotic calcifications of the thoracic aorta are noted without an aneurysmal dilatation. The degree of enhancement is  limited with regards to evaluation for dissection. The thoracic inlet is within normal limits. The esophagus as visualized is within normal limits. LYMPH NODES: No mediastinal, hilar or axillary lymphadenopathy. LUNGS AND PLEURA: The lungs are well aerated bilaterally. No focal infiltrate or pulmonary edema. No pleural effusion or pneumothorax. A few scattered calcified granulomas are noted. UPPER ABDOMEN: The visualized upper abdomen shows no acute abnormality. SOFT TISSUES AND BONES: No acute bone or soft tissue abnormality. IMPRESSION: 1. No pulmonary embolism. 2. Aortic atherosclerosis Electronically signed by: Oneil Devonshire MD 09/12/2024 02:48 AM EST RP Workstation: [id number]   DG Chest Portable 1 View Result Date: 09/11/2024 EXAM: 1 VIEW(S) XRAY OF THE CHEST 09/11/2024 10:18:58 PM COMPARISON: 04/10/2021 CLINICAL HISTORY: CP FINDINGS: LUNGS AND PLEURA: Mild patchy left lower lobe opacity, atelectasis versus pneumonia. Small left pleural effusion. No pneumothorax. HEART AND MEDIASTINUM: Thoracic atherosclerosis. No acute abnormality of the cardiac and mediastinal silhouettes. BONES AND SOFT TISSUES: No acute osseous abnormality. IMPRESSION: 1. Mild patchy left lower lobe opacity, atelectasis versus pneumonia. 2. Small left pleural effusion. Electronically signed by: Pinkie Pebbles MD 09/11/2024 10:25 PM EST RP Workstation: [id number]     Echo pending  TELEMETRY: Sinus rhythm:  ASSESSMENT AND PLAN:  Principal Problem:   Hyperkalemia Active Problems:   Essential hypertension   GERD (gastroesophageal reflux disease)   ESRD (end stage renal disease) (HCC)   Chest pain   Anemia in ESRD (end-stage renal disease) (HCC)   NSTEMI (non-ST elevated myocardial infarction) (HCC)    1.  NSTEMI (63, 318, 613, 754, 1315), nondiagnostic ECG,  in the setting of severe hyperkalemia after missing dialysis x 1 week, on heparin  drip.  With the help of telemetry interpreter, I discussed the risk, benefits  alternatives of cardiac catheterization with the patient and her daughters, and they agreed to proceed with cardiac catheterization tomorrow after another day of dialysis.  The risk, benefits alternatives of cardiac catheterization were explained to the patient and informed written consent has been obtained 2.  Severe hyperkalemia due to missing dialysis for 1 week, improved after emergency hemodialysis 3.  Essential hypertension, blood pressure elevated, on hydralazine    Recommendations   1.  Agree with overall current plan 2.  Continue IV heparin  48-72 hours 3.  Continue metoprolol  succinate 25 mg daily 4.  Review 2D echocardiogram 5.  Cardiac catheterization 09/13/2024.  The risk, benefits alternatives were explained to the patient and informed consent was obtained.  Marsa Dooms, MD, PhD, FACC 09/13/2024 7:57 AM

## 2024-09-14 ENCOUNTER — Inpatient Hospital Stay (HOSPITAL_COMMUNITY)
Admit: 2024-09-14 | Discharge: 2024-09-18 | DRG: 321 | Disposition: A | Discharge status: Home / Self Care | Source: Other Acute Inpatient Hospital | Attending: Student in an Organized Health Care Education/Training Program | Admitting: Student in an Organized Health Care Education/Training Program

## 2024-09-14 ENCOUNTER — Encounter: Admission: EM | Disposition: A | Payer: Self-pay | Source: Home / Self Care | Attending: Internal Medicine

## 2024-09-14 ENCOUNTER — Encounter (HOSPITAL_COMMUNITY): Payer: Self-pay

## 2024-09-14 ENCOUNTER — Other Ambulatory Visit: Payer: Self-pay

## 2024-09-14 DIAGNOSIS — I452 Bifascicular block: Secondary | ICD-10-CM | POA: Diagnosis present

## 2024-09-14 DIAGNOSIS — T82817A Embolism of cardiac prosthetic devices, implants and grafts, initial encounter: Secondary | ICD-10-CM | POA: Diagnosis not present

## 2024-09-14 DIAGNOSIS — Y838 Other surgical procedures as the cause of abnormal reaction of the patient, or of later complication, without mention of misadventure at the time of the procedure: Secondary | ICD-10-CM | POA: Diagnosis not present

## 2024-09-14 DIAGNOSIS — Z803 Family history of malignant neoplasm of breast: Secondary | ICD-10-CM | POA: Diagnosis not present

## 2024-09-14 DIAGNOSIS — R531 Weakness: Secondary | ICD-10-CM | POA: Diagnosis not present

## 2024-09-14 DIAGNOSIS — I214 Non-ST elevation (NSTEMI) myocardial infarction: Principal | ICD-10-CM | POA: Diagnosis present

## 2024-09-14 DIAGNOSIS — K219 Gastro-esophageal reflux disease without esophagitis: Secondary | ICD-10-CM | POA: Diagnosis present

## 2024-09-14 DIAGNOSIS — Z992 Dependence on renal dialysis: Secondary | ICD-10-CM

## 2024-09-14 DIAGNOSIS — Z7982 Long term (current) use of aspirin: Secondary | ICD-10-CM | POA: Diagnosis not present

## 2024-09-14 DIAGNOSIS — I2510 Atherosclerotic heart disease of native coronary artery without angina pectoris: Secondary | ICD-10-CM | POA: Diagnosis present

## 2024-09-14 DIAGNOSIS — E785 Hyperlipidemia, unspecified: Secondary | ICD-10-CM | POA: Diagnosis not present

## 2024-09-14 DIAGNOSIS — Z888 Allergy status to other drugs, medicaments and biological substances status: Secondary | ICD-10-CM | POA: Diagnosis not present

## 2024-09-14 DIAGNOSIS — I25110 Atherosclerotic heart disease of native coronary artery with unstable angina pectoris: Secondary | ICD-10-CM | POA: Diagnosis not present

## 2024-09-14 DIAGNOSIS — E7800 Pure hypercholesterolemia, unspecified: Secondary | ICD-10-CM | POA: Diagnosis present

## 2024-09-14 DIAGNOSIS — N2581 Secondary hyperparathyroidism of renal origin: Secondary | ICD-10-CM | POA: Diagnosis present

## 2024-09-14 DIAGNOSIS — R519 Headache, unspecified: Secondary | ICD-10-CM | POA: Diagnosis not present

## 2024-09-14 DIAGNOSIS — I10 Essential (primary) hypertension: Secondary | ICD-10-CM | POA: Diagnosis not present

## 2024-09-14 DIAGNOSIS — Z79899 Other long term (current) drug therapy: Secondary | ICD-10-CM

## 2024-09-14 DIAGNOSIS — E875 Hyperkalemia: Secondary | ICD-10-CM | POA: Diagnosis present

## 2024-09-14 DIAGNOSIS — D631 Anemia in chronic kidney disease: Secondary | ICD-10-CM | POA: Diagnosis present

## 2024-09-14 DIAGNOSIS — I120 Hypertensive chronic kidney disease with stage 5 chronic kidney disease or end stage renal disease: Secondary | ICD-10-CM | POA: Diagnosis present

## 2024-09-14 DIAGNOSIS — Z955 Presence of coronary angioplasty implant and graft: Secondary | ICD-10-CM

## 2024-09-14 DIAGNOSIS — N186 End stage renal disease: Secondary | ICD-10-CM | POA: Diagnosis present

## 2024-09-14 DIAGNOSIS — M109 Gout, unspecified: Secondary | ICD-10-CM | POA: Diagnosis present

## 2024-09-14 DIAGNOSIS — F40240 Claustrophobia: Secondary | ICD-10-CM | POA: Diagnosis present

## 2024-09-14 DIAGNOSIS — Z743 Need for continuous supervision: Secondary | ICD-10-CM | POA: Diagnosis not present

## 2024-09-14 DIAGNOSIS — I080 Rheumatic disorders of both mitral and aortic valves: Secondary | ICD-10-CM | POA: Diagnosis present

## 2024-09-14 DIAGNOSIS — Z881 Allergy status to other antibiotic agents status: Secondary | ICD-10-CM | POA: Diagnosis not present

## 2024-09-14 DIAGNOSIS — Z91158 Patient's noncompliance with renal dialysis for other reason: Secondary | ICD-10-CM | POA: Diagnosis not present

## 2024-09-14 DIAGNOSIS — H409 Unspecified glaucoma: Secondary | ICD-10-CM | POA: Diagnosis present

## 2024-09-14 DIAGNOSIS — Z9861 Coronary angioplasty status: Secondary | ICD-10-CM | POA: Diagnosis not present

## 2024-09-14 DIAGNOSIS — I2584 Coronary atherosclerosis due to calcified coronary lesion: Secondary | ICD-10-CM | POA: Diagnosis present

## 2024-09-14 DIAGNOSIS — R410 Disorientation, unspecified: Secondary | ICD-10-CM | POA: Diagnosis not present

## 2024-09-14 DIAGNOSIS — I959 Hypotension, unspecified: Secondary | ICD-10-CM | POA: Diagnosis not present

## 2024-09-14 HISTORY — PX: LEFT HEART CATH AND CORONARY ANGIOGRAPHY: CATH118249

## 2024-09-14 LAB — CBC
HCT: 31.3 % — ABNORMAL LOW (ref 36.0–46.0)
Hemoglobin: 10.2 g/dL — ABNORMAL LOW (ref 12.0–15.0)
MCH: 30.2 pg (ref 26.0–34.0)
MCHC: 32.6 g/dL (ref 30.0–36.0)
MCV: 92.6 fL (ref 80.0–100.0)
Platelets: 192 K/uL (ref 150–400)
RBC: 3.38 MIL/uL — ABNORMAL LOW (ref 3.87–5.11)
RDW: 12.9 % (ref 11.5–15.5)
WBC: 6 K/uL (ref 4.0–10.5)
nRBC: 0 % (ref 0.0–0.2)

## 2024-09-14 LAB — HEPARIN LEVEL (UNFRACTIONATED): Heparin Unfractionated: 0.21 [IU]/mL — ABNORMAL LOW (ref 0.30–0.70)

## 2024-09-14 LAB — HEPATITIS B SURFACE ANTIBODY, QUANTITATIVE: Hep B S AB Quant (Post): <3.5 m[IU]/mL — ABNORMAL LOW

## 2024-09-14 SURGERY — LEFT HEART CATH AND CORONARY ANGIOGRAPHY
Anesthesia: Moderate Sedation

## 2024-09-14 MED ORDER — LIDOCAINE HCL 1 % IJ SOLN
INTRAMUSCULAR | Status: AC
  Filled 2024-09-14: qty 20

## 2024-09-14 MED ORDER — LOSARTAN POTASSIUM 50 MG PO TABS
50.0000 mg | ORAL_TABLET | Freq: Every day | ORAL | Status: DC
  Administered 2024-09-14: 09:00:00 50 mg via ORAL
  Filled 2024-09-14: qty 1

## 2024-09-14 MED ORDER — HEPARIN (PORCINE) IN NACL 1000-0.9 UT/500ML-% IV SOLN
INTRAVENOUS | Status: AC
  Filled 2024-09-14: qty 1000

## 2024-09-14 MED ORDER — FREE WATER: 500.0000 mL | Freq: Once | Status: DC

## 2024-09-14 MED ORDER — MECLIZINE HCL 25 MG PO TABS: 25.0000 mg | ORAL_TABLET | Freq: Three times a day (TID) | ORAL | Status: DC | PRN

## 2024-09-14 MED ORDER — FENTANYL CITRATE (PF) 100 MCG/2ML IJ SOLN
INTRAMUSCULAR | Status: DC | PRN
  Administered 2024-09-14: 15:00:00 12.5 ug via INTRAVENOUS

## 2024-09-14 MED ORDER — LIDOCAINE HCL (PF) 1 % IJ SOLN
INTRAMUSCULAR | Status: DC | PRN
  Administered 2024-09-14: 15:00:00 2 mL

## 2024-09-14 MED ORDER — HEPARIN (PORCINE) 25000 UT/250ML-% IV SOLN: 950.0000 [IU]/h | INTRAVENOUS | Status: DC

## 2024-09-14 MED ORDER — MIDAZOLAM HCL 2 MG/2ML IJ SOLN
INTRAMUSCULAR | Status: AC
  Filled 2024-09-14: qty 2

## 2024-09-14 MED ORDER — ACETAMINOPHEN 325 MG PO TABS
650.0000 mg | ORAL_TABLET | ORAL | Status: DC | PRN
  Administered 2024-09-14: 18:00:00 650 mg via ORAL

## 2024-09-14 MED ORDER — NITROGLYCERIN 0.4 MG SL SUBL: 0.4000 mg | SUBLINGUAL_TABLET | SUBLINGUAL | Status: DC | PRN

## 2024-09-14 MED ORDER — METOPROLOL SUCCINATE ER 25 MG PO TB24: 25.0000 mg | ORAL_TABLET | Freq: Every day | ORAL | Status: AC

## 2024-09-14 MED ORDER — ACETAMINOPHEN 325 MG PO TABS
ORAL_TABLET | ORAL | Status: AC
  Filled 2024-09-14: qty 2

## 2024-09-14 MED ORDER — ONDANSETRON HCL 4 MG/2ML IJ SOLN
4.0000 mg | Freq: Four times a day (QID) | INTRAMUSCULAR | Status: DC | PRN
  Administered 2024-09-16: 19:00:00 4 mg via INTRAVENOUS
  Filled 2024-09-14: qty 2

## 2024-09-14 MED ORDER — MELATONIN 3 MG PO TABS
3.0000 mg | ORAL_TABLET | Freq: Every evening | ORAL | Status: DC | PRN
  Administered 2024-09-14: 3 mg via ORAL
  Filled 2024-09-14: qty 1

## 2024-09-14 MED ORDER — IOHEXOL 300 MG/ML  SOLN
INTRAMUSCULAR | Status: DC | PRN
  Administered 2024-09-14: 15:00:00 100 mL

## 2024-09-14 MED ORDER — SODIUM CHLORIDE 0.9% FLUSH: 3.0000 mL | Freq: Two times a day (BID) | INTRAVENOUS | Status: DC

## 2024-09-14 MED ORDER — ACETAMINOPHEN 325 MG PO TABS
650.0000 mg | ORAL_TABLET | ORAL | Status: DC | PRN
  Administered 2024-09-15 – 2024-09-17 (×4): 650 mg via ORAL
  Filled 2024-09-14 (×3): qty 2

## 2024-09-14 MED ORDER — FENTANYL CITRATE (PF) 100 MCG/2ML IJ SOLN
INTRAMUSCULAR | Status: AC
  Filled 2024-09-14: qty 2

## 2024-09-14 MED ORDER — HEPARIN (PORCINE) IN NACL 1000-0.9 UT/500ML-% IV SOLN
INTRAVENOUS | Status: DC | PRN
  Administered 2024-09-14 (×2): 500 mL

## 2024-09-14 MED ORDER — NIFEDIPINE ER OSMOTIC RELEASE 30 MG PO TB24: 30.0000 mg | ORAL_TABLET | Freq: Every day | ORAL | Status: DC

## 2024-09-14 MED ORDER — CARVEDILOL 3.125 MG PO TABS
3.1250 mg | ORAL_TABLET | Freq: Two times a day (BID) | ORAL | Status: DC
  Administered 2024-09-14 – 2024-09-15 (×2): 3.125 mg via ORAL
  Filled 2024-09-14 (×2): qty 1

## 2024-09-14 MED ORDER — ASPIRIN 81 MG PO CHEW: 81.0000 mg | CHEWABLE_TABLET | ORAL | Status: DC

## 2024-09-14 MED ORDER — SODIUM CHLORIDE 0.9 % IV SOLN: 250.0000 mL | INTRAVENOUS | Status: DC | PRN

## 2024-09-14 MED ORDER — HEPARIN BOLUS VIA INFUSION
800.0000 [IU] | Freq: Once | INTRAVENOUS | Status: AC
  Administered 2024-09-14: 07:00:00 800 [IU] via INTRAVENOUS
  Filled 2024-09-14: qty 800

## 2024-09-14 MED ORDER — SODIUM CHLORIDE 0.9% FLUSH: 3.0000 mL | INTRAVENOUS | Status: DC | PRN

## 2024-09-14 MED ORDER — HYDRALAZINE HCL 20 MG/ML IJ SOLN: 10.0000 mg | INTRAMUSCULAR | Status: DC | PRN

## 2024-09-14 MED ORDER — PANTOPRAZOLE SODIUM 20 MG PO TBEC
20.0000 mg | DELAYED_RELEASE_TABLET | Freq: Every day | ORAL | Status: DC
  Administered 2024-09-15 – 2024-09-18 (×3): 20 mg via ORAL
  Filled 2024-09-14 (×3): qty 1

## 2024-09-14 MED ORDER — HEPARIN (PORCINE) 25000 UT/250ML-% IV SOLN
950.0000 [IU]/h | INTRAVENOUS | Status: DC
  Administered 2024-09-14 – 2024-09-15 (×2): 950 [IU]/h via INTRAVENOUS
  Filled 2024-09-14: qty 250

## 2024-09-14 MED ORDER — ONDANSETRON HCL 4 MG/2ML IJ SOLN: 4.0000 mg | Freq: Four times a day (QID) | INTRAMUSCULAR | Status: DC | PRN

## 2024-09-14 MED ORDER — ATORVASTATIN CALCIUM 80 MG PO TABS
80.0000 mg | ORAL_TABLET | Freq: Every day | ORAL | Status: DC
  Administered 2024-09-15 – 2024-09-18 (×3): 80 mg via ORAL
  Filled 2024-09-14 (×3): qty 1

## 2024-09-14 MED ORDER — ASPIRIN 81 MG PO TBEC
81.0000 mg | DELAYED_RELEASE_TABLET | Freq: Every day | ORAL | Status: DC
  Administered 2024-09-15 – 2024-09-18 (×3): 81 mg via ORAL
  Filled 2024-09-14 (×4): qty 1

## 2024-09-14 MED ORDER — MIDAZOLAM HCL (PF) 2 MG/2ML IJ SOLN
INTRAMUSCULAR | Status: DC | PRN
  Administered 2024-09-14: 15:00:00 .5 mg via INTRAVENOUS

## 2024-09-14 MED ORDER — PANTOPRAZOLE SODIUM 40 MG PO TBEC
40.0000 mg | DELAYED_RELEASE_TABLET | Freq: Every day | ORAL | Status: DC
  Administered 2024-09-14: 09:00:00 40 mg via ORAL
  Filled 2024-09-14: qty 1

## 2024-09-14 MED ORDER — NIFEDIPINE ER OSMOTIC RELEASE 30 MG PO TB24
30.0000 mg | ORAL_TABLET | Freq: Every day | ORAL | Status: DC
  Administered 2024-09-15: 09:00:00 30 mg via ORAL
  Filled 2024-09-14 (×2): qty 1

## 2024-09-14 SURGICAL SUPPLY — 10 items
CATH INFINITI 5FR MULTPACK ANG (CATHETERS) IMPLANT
DEVICE CLOSURE MYNXGRIP 5F (Vascular Products) IMPLANT
DRAPE BRACHIAL (DRAPES) IMPLANT
NDL PERC 18GX7CM (NEEDLE) IMPLANT
NEEDLE PERC 18GX7CM (NEEDLE) IMPLANT
PACK CARDIAC CATH (CUSTOM PROCEDURE TRAY) ×1 IMPLANT
SET ATX-X65L (MISCELLANEOUS) IMPLANT
SHEATH AVANTI 5FR X 11CM (SHEATH) IMPLANT
STATION PROTECTION PRESSURIZED (MISCELLANEOUS) IMPLANT
WIRE EMERALD 3MM-J .035X150CM (WIRE) IMPLANT

## 2024-09-14 NOTE — Discharge Summary (Signed)
 Physician Discharge Summary  Leah Davies FMW:[id number] DOB: 1941/03/29 DOA: 09/11/2024  PCP: Sherial Bail, MD  Admit date: 09/11/2024 Discharge date: 09/14/2024  Admitted From: home Disposition:  transfer to [facility] for CABG  Recommendations for Outpatient Follow-up:  As per physicians at [facility]: no  Equipment/Devices:  Discharge Condition: stable  CODE STATUS: full  Diet recommendation: NPO until cardio clears pt to eat  Brief/Interim Summary: HPI was taken from Dr. Hilma: Leah Davies is a 83 y.o. female with medical history significant of ESRD-HD (MWF), HTN, HLD, gout, GERD, anemia, who presents with chest pain, weakness and HA.    Per patient and her 2 daughters at bedside, patient was on vacation in [place], just returned back from [place] by airplane (about 4 hours) today.  She missed her dialysis for the whole week.  She developed the chest pain, which is located on the left side of her chest, constant, radiating to the back, sharp, pleuritic, aggravated by deep breath.  Patient has mild SOB.  No cough, fever or chills.  Patient has nausea and vomited once earlier, no diarrhea or abdominal pain.  Still making little urine, no symptoms of UTI.  No fall or head injury.  No dark stool or rectal bleeding.   Data reviewed independently and ED Course: pt was found to have potassium of 7.1, bicarbonate of 15, creatinine 11.30, BUN 110, GFR 3, WBC 6.5, troponin 63.  Temperature normal, blood pressure 171/59, heart rate 82, RR 19, oxygen/97% on room air.  Chest x-ray showed patchy opacity in left lower lobe.  Patient is placed in telemetry bed to follow-up patient.  Consulted Dr. Marcelino of renal.     EKG: I have personally reviewed.  Sinus rhythm, QTc 446, bifascicular block, T wave peaking diffusely  Discharge Diagnoses:  Principal Problem:   Hyperkalemia Active Problems:   ESRD (end stage renal disease) (HCC)   Chest pain   NSTEMI (non-ST elevated  myocardial infarction) (HCC)   Essential hypertension   GERD (gastroesophageal reflux disease)   Anemia in ESRD (end-stage renal disease) (HCC)  Hyperkalemia: secondary to pt missing HD x 1 week. S/p lokelma . Resolved   ESRD: on HD MWF. Missed HD x 1 week. Nephro following and recs apprec    NSTEMI: as per cardio. Troponins are trending up. Restart IV heparin  drip in 6 hrs as per cardio. 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 Accepting physician, Dr. Redell Shallow.    Elevated D-dimer: CTA chest neg for PE    HTN: continue on losartan , nifedipine     GERD: continue on PPI    ACD: likely secondary to ESRD. Will transfuse if Hb < 7.0     Obesity: BMI 30.1. Would benefit from weight loss   Discharge Instructions  Discharge Instructions     Discharge instructions   Complete by: As directed    As per physicians at [facility] recommendation   Increase activity slowly   Complete by: As directed       Allergies as of 09/14/2024       Reactions   Amlodipine  Hypertension, Other (See Comments), Rash   Caused increased blood pressure Other reaction(s): kidney failure Caused increased blood pressure    Other reaction(s): Unknown Other reaction(s): Unknown    Caused increased blood pressure   Azithromycin  Other (See Comments)   Unknown Other reaction(s): Unknown Unknown    unknown    Unknown   Prednisone Other (See Comments)  Pt states she feels like her BP dropped, got dizzy, and very pale.   Tizanidine Other (See Comments)   Dizziness, pt states BP dropped, and very pale.         Medication List     STOP taking these medications    hydrocortisone  2.5 % rectal cream Commonly known as: ANUSOL -HC       TAKE these medications    acetaminophen  500 MG tablet Commonly known as: TYLENOL  Take 1,000 mg by mouth every 6 (six) hours as needed.   aspirin  EC 81 MG tablet Take 1 tablet by mouth daily. What changed:  Another medication with the same name was removed. Continue taking this medication, and follow the directions you see here.   calcium  acetate 667 MG tablet Commonly known as: PHOSLO Take 667 mg by mouth 3 (three) times daily.   heparin  25000 UT/250ML infusion Inject 950 Units/hr into the vein continuous.   losartan  50 MG tablet Commonly known as: COZAAR  Take 50 mg by mouth daily.   meclizine  25 MG tablet Commonly known as: ANTIVERT  TAKE 1 TABLET BY MOUTH 3 TIMES DAILY AS NEEDED FOR DIZZINESS OR NAUSEA.   metoprolol  succinate 25 MG 24 hr tablet Commonly known as: TOPROL -XL Take 1 tablet (25 mg total) by mouth daily. Start taking on: September 15, 2024   multivitamin Tabs tablet Take 1 tablet by mouth daily.   NIFEdipine  30 MG 24 hr tablet Commonly known as: PROCARDIA -XL/NIFEDICAL-XL Take 30 mg by mouth at bedtime.   omeprazole 20 MG capsule Commonly known as: PRILOSEC Take 20 mg by mouth daily as needed (reflux).        Follow-up Information     Sherial Bail, MD Follow up.   Specialty: Internal Medicine Why: Hospital follow up Contact information: [address] [place] [address] [phone]         Launie Maiden, Ronal Maxwell, NP. Go in 1 week(s).   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   Ost Cx to Mid Cx lesion is 80% stenosed.   Prox RCA lesion is 90% stenosed.   There is moderate left ventricular systolic dysfunction.   The left ventricular ejection fraction is 25-35% by visual estimate. 1.  NSTEMI 2.  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  Moderate to severely reduced left ventricular function, with estimated LV  ejection fraction 30%, with anterior apical dyskinesis Recommendations 1.  Transfer to [facility] for evaluation for CABG 2.  Resume heparin  drip   US  Venous Img Lower Bilateral (DVT) Result Date: 09/12/2024 EXAM: ULTRASOUND DUPLEX OF THE BILATERAL LOWER EXTREMITY VEINS TECHNIQUE: Duplex ultrasound using B-mode/gray scaled imaging and Doppler spectral analysis and color flow was obtained of the deep venous structures of the bilateral lower extremity. COMPARISON: None available. CLINICAL HISTORY: 220372 Positive D dimer [id number] Positive D dimer FINDINGS: The common femoral veins, femoral veins, popliteal veins, and posterior tibial veins demonstrate normal compressibility with normal color flow and spectral analysis. IMPRESSION: 1. No evidence of DVT. Electronically signed by: Francis Quam MD 09/12/2024 06:50 AM EST RP Workstation: [id number]   CT Angio Chest Pulmonary Embolism (PE) W or WO Contrast Result Date: 09/12/2024  EXAM: CTA of the Chest with contrast for PE 09/12/2024 02:33:48 AM TECHNIQUE: CTA of the chest was performed after the administration of 75 mL iohexol  (OMNIPAQUE ) 350 MG/ML injection. Multiplanar reformatted images are provided for review. MIP images are provided for review. Automated exposure control, iterative reconstruction, and/or weight based adjustment of the mA/kV was utilized to reduce the radiation dose to as low as reasonably achievable. COMPARISON: Chest x-ray from the previous day. CLINICAL HISTORY: Chest pain FINDINGS: PULMONARY ARTERIES: No pulmonary embolism. Main pulmonary artery is normal in caliber. The pulmonary artery shows a normal branching pattern. MEDIASTINUM: The heart and pericardium demonstrate no acute abnormality. Coronary calcifications are noted. Atherosclerotic calcifications of the thoracic aorta are noted without an aneurysmal dilatation. The degree of enhancement is limited with regards to evaluation for dissection. The thoracic inlet is within  normal limits. The esophagus as visualized is within normal limits. LYMPH NODES: No mediastinal, hilar or axillary lymphadenopathy. LUNGS AND PLEURA: The lungs are well aerated bilaterally. No focal infiltrate or pulmonary edema. No pleural effusion or pneumothorax. A few scattered calcified granulomas are noted. UPPER ABDOMEN: The visualized upper abdomen shows no acute abnormality. SOFT TISSUES AND BONES: No acute bone or soft tissue abnormality. IMPRESSION: 1. No pulmonary embolism. 2. Aortic atherosclerosis Electronically signed by: Oneil Devonshire MD 09/12/2024 02:48 AM EST RP Workstation: [id number]   DG Chest Portable 1 View Result Date: 09/11/2024 EXAM: 1 VIEW(S) XRAY OF THE CHEST 09/11/2024 10:18:58 PM COMPARISON: 04/10/2021 CLINICAL HISTORY: CP FINDINGS: LUNGS AND PLEURA: Mild patchy left lower lobe opacity, atelectasis versus pneumonia. Small left pleural effusion. No pneumothorax. HEART AND MEDIASTINUM: Thoracic atherosclerosis. No acute abnormality of the cardiac and mediastinal silhouettes. BONES AND SOFT TISSUES: No acute osseous abnormality. IMPRESSION: 1. Mild patchy left lower lobe opacity, atelectasis versus pneumonia. 2. Small left pleural effusion. Electronically signed by: Pinkie Pebbles MD 09/11/2024 10:25 PM EST RP Workstation: [id number]   (Echo, Carotid, EGD, Colonoscopy, ERCP)    Subjective: pt c/o fatigue    Discharge Exam: Vitals:   09/14/24 1537 09/14/24 1545  BP: (!) 127/56 (!) 114/52  Pulse: (!) 59 (!) 58  Resp: 14 18  Temp:  (!) 97.1 F (36.2 C)  SpO2: 96% 96%   Vitals:   09/14/24 1520 09/14/24 1525 09/14/24 1537 09/14/24 1545  BP: (!) 122/44 (!) 120/48 (!) 127/56 (!) 114/52  Pulse: 60 (!) 59 (!) 59 (!) 58  Resp: 19 15 14 18   Temp:    (!) 97.1 F (36.2 C)  TempSrc:    Temporal  SpO2: 99% 99% 96% 96%  Weight:      Height:        General: Pt is alert, awake, not in acute distress Cardiovascular:  S1/S2 +, no rubs, no gallops Respiratory: CTA  bilaterally, no wheezing, no rhonchi Abdominal: Soft, NT, ND, bowel sounds + Extremities: no cyanosis    The results of significant diagnostics from this hospitalization (including imaging, microbiology, ancillary and laboratory) are listed below for reference.     Microbiology: No results found for this or any previous visit (from the past 240 hours).   Labs: BNP (last 3 results) No results for input(s): BNP in the last 8760 hours. Basic Metabolic Panel: Recent Labs  Lab 09/11/24 2130 09/12/24 0240 09/12/24 1032 09/13/24 0627 09/13/24 1613  NA 134* 136 136 135 134*  K 7.1* 6.2* 4.1 4.7 4.0  CL 97* 99 93* 93* 93*  CO2 15* 15* 25 25 26   GLUCOSE 105* 91 84 82 76  BUN 110* 108* 40* 50* 18  CREATININE 11.30* 11.40* 5.72* 7.23* 3.37*  CALCIUM  9.6 9.7 9.4 9.0 9.1  PHOS  --   --   --   --  3.5   Liver Function Tests: Recent Labs  Lab 09/11/24 2130 09/13/24 1613  AST 16  --   ALT 10  --   ALKPHOS 72  --   BILITOT 0.3  --   PROT 7.9  --   ALBUMIN 4.4 4.1   No results for input(s): LIPASE, AMYLASE in the last 168 hours. No results for input(s): AMMONIA in the last 168 hours. CBC: Recent Labs  Lab 09/11/24 2130 09/13/24 0627 09/14/24 0546  WBC 6.5 5.9 6.0  NEUTROABS 4.8  --   --   HGB 10.9* 10.4* 10.2*  HCT 33.7* 32.2* 31.3*  MCV 94.4 93.6 92.6  PLT 178 202 192   Cardiac Enzymes: No results for input(s): CKTOTAL, CKMB, CKMBINDEX, TROPONINI in the last 168 hours. BNP: Invalid input(s): POCBNP CBG: Recent Labs  Lab 09/12/24 0425 09/12/24 0535  GLUCAP 97 97   D-Dimer Recent Labs    09/11/24 2353  DDIMER 0.82*   Hgb A1c No results for input(s): HGBA1C in the last 72 hours. Lipid Profile No results for input(s): CHOL, HDL, LDLCALC, TRIG, CHOLHDL, LDLDIRECT in the last 72 hours. Thyroid  function studies No results for input(s): TSH, T4TOTAL, T3FREE, THYROIDAB in the last 72 hours.  Invalid input(s):  FREET3 Anemia work up No results for input(s): VITAMINB12, FOLATE, FERRITIN, TIBC, IRON, RETICCTPCT in the last 72 hours. Urinalysis    Component Value Date/Time   COLORURINE COLORLESS (A) 04/08/2020 1535   APPEARANCEUR CLEAR (A) 04/08/2020 1535   LABSPEC 1.006 04/08/2020 1535   PHURINE 5.0 04/08/2020 1535   GLUCOSEU NEGATIVE 04/08/2020 1535   HGBUR NEGATIVE 04/08/2020 1535   BILIRUBINUR NEGATIVE 04/08/2020 1535   KETONESUR NEGATIVE 04/08/2020 1535   PROTEINUR 100 (A) 04/08/2020 1535   NITRITE NEGATIVE 04/08/2020 1535   LEUKOCYTESUR NEGATIVE 04/08/2020 1535   Sepsis Labs Recent Labs  Lab 09/11/24 2130 09/13/24 0627 09/14/24 0546  WBC 6.5 5.9 6.0   Microbiology No results found for this or any previous visit (from the past 240 hours).   Time coordinating discharge: 35 minutes  SIGNED:   Anthony CHRISTELLA Pouch, MD  [facility] 09/14/2024, 3:52 PM Pager   If 7PM-7AM, please contact night-coverage [email or web address]      [1]  Allergies Allergen Reactions   Amlodipine  Hypertension, Other (See Comments) and Rash    Caused increased blood pressure  Other reaction(s): kidney failure Caused increased blood pressure    Other reaction(s): Unknown Other reaction(s): Unknown    Caused increased blood pressure   Azithromycin  Other (See Comments)    Unknown  Other reaction(s): Unknown Unknown    unknown    Unknown   Prednisone Other (See Comments)    Pt states she feels like her BP dropped, got dizzy, and very pale.   Tizanidine Other (See Comments)    Dizziness, pt states BP dropped, and very pale.

## 2024-09-14 NOTE — Plan of Care (Signed)

## 2024-09-14 NOTE — Progress Notes (Signed)
 Report called to Elora Lesch RN, at [facility]

## 2024-09-14 NOTE — H&P (Addendum)
 Cardiology Admission History and Physical   Patient ID: BRYNNLY BONET MRN: [id number]; DOB: 1941-07-21   Admission date: 09/14/2024  PCP:  Sherial Bail, MD   [facility] Providers Cardiologist:  None       Chief Complaint:  CABG evaluation  Patient Profile: JERMYA DOWDING is a 83 y.o. 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  History of Present Illness: Ms. Takacs patient had presented to [facility] for weakness, with nausea and vomiting, decreased urination, and pleuritic chest discomfort which radiated to her back . She had missed a week of dialysis as she was in [place]. In the ED she was noted to have hyperkalemia [K 7.1], bicarbonate of 15, Cr 11.3, BUN 110, Troponin 63 -> 318 -> 613 -> 754 -> 1315. D-dimer 0.832. CXR had showed patchy opacity in the LLL. CTA chest negative for PE.   Nephrology was consulted for ESRD and electrolyte derangements in the setting of missed HD.  Cardiology was consulted for NSTEMI and underwent LHC on 12/16 which showed ost-mid LM lesion with 75% stenosis, Ost to mid Lcx lesion with 80% stenosis, and proximal RCA lesion with 90% stenosis. LVEF estimated 25-35%. An echocardiogram was preformed 12/15, though that final report is not complete.  Given extent of CAD she was transferred to [facility] for CABG evaluation. Per cath report IV heparin  was to be resumed.   On arrival to [facility] patient was hemodynamically stable.   Patient reported she does have a mild headache, though no chest pain. Denied shortness of breath. Did report being hungry.    Past Medical History:  Diagnosis Date   Arthritis    Chronic kidney disease    renal insufficiency   Complication of anesthesia    Vital signs low during colonoscopy had trouble to wake up    Elevated lipids    GERD (gastroesophageal reflux disease)    Glaucoma    Gout    HLD (hyperlipidemia)     Hypertension    Past Surgical History:  Procedure Laterality Date   A/V FISTULAGRAM Left 08/22/2020   Procedure: A/V FISTULAGRAM;  Surgeon: Jama Cordella MATSU, MD;  Location: [facility] INVASIVE CV LAB;  Service: Cardiovascular;  Laterality: Left;   A/V FISTULAGRAM Left 01/02/2021   Procedure: A/V FISTULAGRAM;  Surgeon: Jama Cordella MATSU, MD;  Location: [facility] INVASIVE CV LAB;  Service: Cardiovascular;  Laterality: Left;   A/V FISTULAGRAM Left 04/11/2021   Procedure: A/V Fistulagram;  Surgeon: Marea Selinda RAMAN, MD;  Location: [facility] INVASIVE CV LAB;  Service: Cardiovascular;  Laterality: Left;   A/V FISTULAGRAM Left 05/12/2023   Procedure: A/V Fistulagram;  Surgeon: Marea Selinda RAMAN, MD;  Location: [facility] INVASIVE CV LAB;  Service: Cardiovascular;  Laterality: Left;   AV FISTULA PLACEMENT Left 07/28/2020   Procedure: ARTERIOVENOUS (AV) FISTULA CREATION (BRACHIAL CEPHALIC );  Surgeon: Jama Cordella MATSU, MD;  Location: [facility] ORS;  Service: Vascular;  Laterality: Left;   BREAST EXCISIONAL BIOPSY Left 2010   benign   BREAST SURGERY     Lt breast biopsy   DIALYSIS/PERMA CATHETER INSERTION N/A 04/10/2020   Procedure: DIALYSIS/PERMA CATHETER INSERTION;  Surgeon: Marea Selinda RAMAN, MD;  Location: [facility] INVASIVE CV LAB;  Service: Cardiovascular;  Laterality: N/A;   DIALYSIS/PERMA CATHETER REMOVAL N/A 01/30/2021   Procedure: DIALYSIS/PERMA CATHETER REMOVAL;  Surgeon: Jama Cordella MATSU, MD;  Location: [facility] INVASIVE CV LAB;  Service: Cardiovascular;  Laterality: N/A;   KNEE  ARTHROSCOPY Left 10/05/2015   Procedure: ARTHROSCOPY KNEE partial medial and lateral meniscectomy, lateral release for sublux patella;  Surgeon: Ozell Flake, MD;  Location: [facility] ORS;  Service: Orthopedics;  Laterality: Left;     Medications Prior to Admission: Prior to Admission medications  Medication Sig Start Date End Date Taking? Authorizing Provider  acetaminophen  (TYLENOL ) 500 MG tablet Take 1,000 mg by mouth every 6 (six) hours as needed.    [provider]  aspirin  81 MG EC tablet Take 1 tablet by mouth daily. 04/12/21   [provider]  calcium  acetate (PHOSLO) 667 MG tablet Take 667 mg by mouth 3 (three) times daily.    [provider]  heparin  25000 UT/250ML infusion Inject 950 Units/hr into the vein continuous. 09/14/24   Trudy Anthony HERO, MD  losartan  (COZAAR ) 50 MG tablet Take 50 mg by mouth daily.    [provider]  meclizine  (ANTIVERT ) 25 MG tablet TAKE 1 TABLET BY MOUTH 3 TIMES DAILY AS NEEDED FOR DIZZINESS OR NAUSEA. 03/26/23   [provider]  metoprolol  succinate (TOPROL -XL) 25 MG 24 hr tablet Take 1 tablet (25 mg total) by mouth daily. 09/15/24   Trudy Anthony HERO, MD  multivitamin (RENA-VIT) TABS tablet Take 1 tablet by mouth daily.    [provider]  NIFEdipine  (PROCARDIA -XL/NIFEDICAL-XL) 30 MG 24 hr tablet Take 30 mg by mouth at bedtime.    [provider]  omeprazole (PRILOSEC) 20 MG capsule Take 20 mg by mouth daily as needed (reflux).  08/18/20   [provider]     Allergies:   Allergies[1]  Social History:   Social History   Socioeconomic History   Marital status: Single    Spouse name: Not on file   Number of children: 6   Years of education: Not on file   Highest education level: Not on file  Occupational History   Not on file  Tobacco Use   Smoking status: Never   Smokeless tobacco: Never  Vaping Use   Vaping status: Never Used  Substance and Sexual Activity   Alcohol use: Not Currently    Comment: rare   Drug use: No   Sexual activity: Not on file  Other Topics Concern   Not on file  Social History Narrative   Lives with daughter Daved.    Social Drivers of Health   Tobacco Use: Low Risk (09/13/2024)   Patient History    Smoking Tobacco Use: Never    Smokeless Tobacco Use: Never    Passive Exposure: Not on file  Financial Resource Strain: Low Risk  (07/08/2024)   Received from [facility]    Overall Financial Resource Strain (CARDIA)    Difficulty of Paying Living Expenses: Not hard at all  Food Insecurity: No Food Insecurity (09/13/2024)   Epic    Worried About Running Out of Food in the Last Year: Never true    Ran Out of Food in the Last Year: Never true  Transportation Needs: No Transportation Needs (09/13/2024)   Epic    Lack of Transportation (Medical): No    Lack of Transportation (Non-Medical): No  Physical Activity: Not on file  Stress: Not on file  Social Connections: Not on file  Intimate Partner Violence: Not At Risk (09/13/2024)   Epic    Fear of Current or Ex-Partner: No    Emotionally Abused: No    Physically Abused: No    Sexually Abused: No  Depression (PHQ2-9): Not on file  Alcohol Screen: Not on file  Housing: Low Risk (09/13/2024)   Epic    Unable to Pay for Housing in the Last Year: No    Number of Times Moved in the Last Year: 0    Homeless in the Last Year: No  Utilities: Not At Risk (09/13/2024)   Epic    Threatened with loss of utilities: No  Health Literacy: Not on file     Family History:   The patient's family history includes Breast cancer (age of onset: 73) in her sister.    ROS:  Please see the history of present illness.  All other ROS reviewed and negative.     Physical Exam/Data: Vitals:   09/14/24 1922  BP: (!) 158/54  Pulse: 70  Resp: 18  Temp: 97.7 F (36.5 C)  TempSrc: Oral  SpO2: 99%  Weight: 61.4 kg   No intake or output data in the 24 hours ending 09/14/24 1946    09/14/2024    7:22 PM 09/14/2024    5:00 AM 09/13/2024    2:21 PM  Last 3 Weights  Weight (lbs) 135 lb 5.8 oz 134 lb 7.7 oz 132 lb 4.4 oz  Weight (kg) 61.4 kg 61 kg 60 kg     Body mass index is 29.29 kg/m.  General:  Older woman in no acute distress HEENT: normal Neck: JVD Vascular: Distal pulses 2+ bilaterally . R Fem site stable.Dressing clean and dry. Cardiac:  normal S1, S2; RRR; no murmur  Lungs:  clear to auscultation bilaterally,  no wheezing, rhonchi or rales  Ext: no edema Musculoskeletal:  No deformities, BUE and BLE strength normal and equal Skin: warm and dry  Neuro:  CNs 2-12 intact, no focal abnormalities noted Psych:  Normal affect   EKG:  The ECG that was done on 12/14 was personally reviewed and demonstrates sinus rhythm with RBBB VR 74 ST elevations in lateral leads. Will update here at [facility].  Relevant CV Studies: LHC 09/14/24   Ost LM to Mid LM lesion is 75% stenosed.   Ost Cx to Mid Cx lesion is 80% stenosed.   Prox RCA lesion is 90% stenosed.   There is moderate left ventricular systolic dysfunction.   The left ventricular ejection fraction is 25-35% by visual estimate.   1.  NSTEMI 2.  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  Moderate to severely reduced left ventricular function, with estimated LV ejection fraction 30%, with anterior apical dyskinesis   Recommendations   1.  Transfer to [facility] for evaluation for CABG 2.  Resume heparin  drip  Laboratory Data: High Sensitivity Troponin:   Recent Labs  Lab 09/11/24 2130 09/11/24 2331 09/12/24 0240 09/12/24 0530 09/12/24 1032  TRNPT 63* 318* 613* 754* 1,315*        Chemistry Recent Labs  Lab 09/13/24 0627 09/13/24 1613  NA 135 134*  K 4.7 4.0  CL 93* 93*  CO2 25 26  GLUCOSE 82 76  BUN 50* 18  CREATININE 7.23* 3.37*  CALCIUM  9.0 9.1  GFRNONAA 5* 13*  ANIONGAP 18* 15    Recent Labs  Lab 09/11/24 2130 09/13/24 1613  PROT 7.9  --   ALBUMIN 4.4 4.1  AST 16  --   ALT 10  --   ALKPHOS 72  --   BILITOT 0.3  --    Hematology Recent Labs  Lab 09/13/24 0627 09/14/24 0546  WBC 5.9 6.0  RBC 3.44* 3.38*  HGB 10.4* 10.2*  HCT 32.2* 31.3*  MCV 93.6 92.6  MCH 30.2 30.2  MCHC 32.3 32.6  RDW 13.1 12.9  PLT 202 192   DDimer  Recent Labs  Lab 09/11/24 2353  DDIMER 0.82*    Radiology/Studies:  CARDIAC CATHETERIZATION Result Date:  09/14/2024   Ost LM to Mid LM lesion is 75% stenosed.   Ost Cx to Mid Cx lesion is 80% stenosed.   Prox RCA lesion is 90% stenosed.   There is moderate left ventricular systolic dysfunction.   The left ventricular ejection fraction is 25-35% by visual estimate. 1.  NSTEMI 2.  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  Moderate to severely reduced left ventricular function, with estimated LV ejection fraction 30%, with anterior apical dyskinesis Recommendations 1.  Transfer to [facility] for evaluation for CABG 2.  Resume heparin  drip     Assessment and Plan:  NSTEMI CAD Patient to be evaluated by CT surgery in the morning. She is current chest pain free. She does report a headache though this is not a new symptom. Will hold off one ordering CT head. Echo read pending from [facility].   Continue IV heparin  [to start around 2140 ] Continue ASA 81 Stop metoprolol  and switch to coreg  3.25 mg BID   Hyperlipidemia Lipid panel for the am Increase lipitor  40 mg to 80 mg  Hypertension BP: 158/54 Coreg  as above.   Continue nifedipine  30 mg   ESRD on HD Anemia of Chronic disease Hyperkalemia Patient had missed HD for one week. Did receive urgent dialysis and lokelma  at [facility] On exam, she appears mildly volume up though no urgent need for HD tonight. Will consult nephrology in the am.  GERD Continue protonix    Risk Assessment/Risk Scores:   TIMI Risk Score for Unstable Angina or Non-ST Elevation MI:   The patient's TIMI risk score is 6, which indicates a 41% risk of all cause mortality, new or recurrent myocardial infarction or need for urgent revascularization in the next 14 days.     Code Status: Full Code  Severity of Illness: The appropriate patient status for this patient is INPATIENT. Inpatient status is judged to be reasonable and necessary in order to provide the required intensity of service to ensure  the patient's safety. The patient's presenting symptoms, physical exam findings, and initial radiographic and laboratory data in the context of their chronic comorbidities is felt to place them at high risk for further clinical deterioration. Furthermore, it is not anticipated that the patient will be medically stable for discharge from the hospital within 2 midnights of admission.   * I certify that at the point of admission it is my clinical judgment that the patient will require inpatient hospital care spanning beyond 2 midnights from the point of admission due to high intensity of service, high risk for further deterioration and high frequency of surveillance required.*  For questions or updates, please contact [facility] Please consult [email or web address] for contact info under       Signed, Leontine LOISE Salen, PA-C  09/14/2024 7:46 PM   Patient seen and examined.  Agree with above documentation.  Ms. Forster is an 83 year old female with a history of ESRD, hypertension who is transferred from [facility] for evaluation for CABG.  She initially presented to [facility] on 12/13.  She had been on vacation in [place] and missed dialysis for a whole week.  On arriving back in [place]  she developed chest pain, prompting her to present to the ED.  Presentation to the ED and was found to have potassium 7.1, bicarb 15, BUN 110.  She underwent urgent dialysis.  Labs have improved.  Also reported chest pain and had troponin elevation 63 > 318 > 613 > 754 > 1315.  Echocardiogram was done yesterday, read pending but on my review appears overall preserved  LV systolic function.  LHC today showed 75% ostial to mid left main stenosis and 80% ostial to mid LCx stenosis and 90% proximal RCA stenosis.  She was transferred for CABG evaluation.  On exam, patient is alert and oriented, regular rate and rhythm, no murmurs, lungs CTAB, no LE edema or JVD.  She currently denies any chest pain.  Will plan cardiac surgery consult  for CABG evaluation.  Will consult nephrology given ESRD.    Lonni LITTIE Nanas, MD       [1]  Allergies Allergen Reactions   Amlodipine  Hypertension, Other (See Comments) and Rash    Caused increased blood pressure  Other reaction(s): kidney failure Caused increased blood pressure    Other reaction(s): Unknown Other reaction(s): Unknown    Caused increased blood pressure   Azithromycin  Other (See Comments)    Unknown  Other reaction(s): Unknown Unknown    unknown    Unknown   Prednisone Other (See Comments)    Pt states she feels like her BP dropped, got dizzy, and very pale.   Tizanidine Other (See Comments)    Dizziness, pt states BP dropped, and very pale.

## 2024-09-14 NOTE — Progress Notes (Cosign Needed Addendum)
 [facility] CLINIC CARDIOLOGY PROGRESS NOTE   Patient ID: Leah Davies MRN: [id number] DOB/AGE: [birth year] 83 y.o.  Admit date: 09/11/2024 Referring Physician Dr. Caleb Exon Primary Physician Sherial Bail, MD  Primary Cardiologist Tinnie Maiden, NP Reason for Consultation Chest pain  HPI: Leah Davies is a 83 y.o. female with a past medical history of ESRD on HD, GERD, chronic anemia who presented to the ED on 09/11/2024 for chest pain. Cardiology was consulted for further evaluation.   Interval History:  -Patient seen and examined this AM, resting in hospital bed with daughters at bedside.  -No complaints of CP or SOB.  -BP and HR stable. Not on tele.  -Plan for LHC today.  Review of systems complete and found to be negative unless listed above   Vitals:   09/13/24 1545 09/13/24 2002 09/14/24 0432 09/14/24 0500  BP: (!) 141/48 (!) 135/58 (!) 131/59   Pulse: 63 66 61   Resp: 16 16 15    Temp: 98.2 F (36.8 C) (!) 97.5 F (36.4 C) 98.8 F (37.1 C)   TempSrc:  Oral Oral   SpO2: 95% 92% 95%   Weight:    61 kg  Height:         Intake/Output Summary (Last 24 hours) at 09/14/2024 9167 Last data filed at 09/13/2024 1400 Gross per 24 hour  Intake --  Output 1300 ml  Net -1300 ml     PHYSICAL EXAM General: Chronically ill appearing elderly female, well nourished, in no acute distress. HEENT: Normocephalic and atraumatic. Neck: No JVD.  Lungs: Normal respiratory effort on room air. Clear bilaterally to auscultation. No wheezes, crackles, rhonchi.  Heart: HRRR. Normal S1 and S2 without gallops or murmurs. Radial & DP pulses 2+ bilaterally. Abdomen: Non-distended appearing.  Msk: Normal strength and tone for age. Extremities: No clubbing, cyanosis or edema.   Neuro: Alert and oriented X 3. Psych: Mood appropriate, affect congruent.    LABS: Basic Metabolic Panel: Recent Labs    09/13/24 0627 09/13/24 1613  NA 135 134*  K 4.7 4.0  CL 93* 93*  CO2 25 26   GLUCOSE 82 76  BUN 50* 18  CREATININE 7.23* 3.37*  CALCIUM  9.0 9.1  PHOS  --  3.5   Liver Function Tests: Recent Labs    09/11/24 2130 09/13/24 1613  AST 16  --   ALT 10  --   ALKPHOS 72  --   BILITOT 0.3  --   PROT 7.9  --   ALBUMIN 4.4 4.1   No results for input(s): LIPASE, AMYLASE in the last 72 hours. CBC: Recent Labs    09/11/24 2130 09/13/24 0627 09/14/24 0546  WBC 6.5 5.9 6.0  NEUTROABS 4.8  --   --   HGB 10.9* 10.4* 10.2*  HCT 33.7* 32.2* 31.3*  MCV 94.4 93.6 92.6  PLT 178 202 192   Cardiac Enzymes: No results for input(s): CKTOTAL, CKMB, CKMBINDEX, TROPONINIHS in the last 72 hours. BNP: No results for input(s): BNP in the last 72 hours. D-Dimer: Recent Labs    09/11/24 2353  DDIMER 0.82*   Hemoglobin A1C: No results for input(s): HGBA1C in the last 72 hours. Fasting Lipid Panel: No results for input(s): CHOL, HDL, LDLCALC, TRIG, CHOLHDL, LDLDIRECT in the last 72 hours. Thyroid  Function Tests: No results for input(s): TSH, T4TOTAL, T3FREE, THYROIDAB in the last 72 hours.  Invalid input(s): FREET3 Anemia Panel: No results for input(s): VITAMINB12, FOLATE, FERRITIN, TIBC, IRON, RETICCTPCT in the last 72 hours.  No results found.   ECHO pending  TELEMETRY (personally reviewed): not on tele  EKG (personally reviewed): NSR bifascicular block rate 74 bpm  DATA reviewed by me 09/14/2024: last 24h vitals tele labs imaging I/O, hospitalist progress note  Principal Problem:   Hyperkalemia Active Problems:   Essential hypertension   GERD (gastroesophageal reflux disease)   ESRD (end stage renal disease) (HCC)   Chest pain   Anemia in ESRD (end-stage renal disease) (HCC)   NSTEMI (non-ST elevated myocardial infarction) (HCC)    ASSESSMENT AND PLAN: Leah Davies is a 83 y.o. female with a past medical history of ESRD on HD, GERD, chronic anemia who presented to the ED on 09/11/2024 for chest  pain. Cardiology was consulted for further evaluation.   # NSTEMI # Severe hyperkalemia # Hypertension Patient present with CP in the setting of missed HD x1 week, K elevated at 7.1 s/p emergent HD and lokelma . Troponins 63 > 318 > 613 > 754 > 1315. EKG without acute ischemic changes. Started on IV heparin .  -Discussed the risks and benefits of proceeding with LHC for further evaluation with the patient.  He is agreeable to proceed.  NPO until LHC this afternoon (09/14/24) with Dr. Ammon.  Written consent will be obtained.  Further recommendations following LHC.   -Continue IV heparin , plan to DC after LHC.  -Continue aspirin  81 mg daily and atorvastatin  40 mg daily.  -Continue metoprolol  succinate 25 mg daily.  ADDENDUM: Patient with severe multivessel disease, recommend transfer to [facility] for CABG evaluation. Dr. Redell Shallow updated by Dr. Ammon and will be the accepting physician.   This patient's case was discussed and created with Dr. Florencio and he is in agreement.  Signed:  Danita Bloch, PA-C  09/14/2024, 8:32 AM [facility]

## 2024-09-14 NOTE — TOC Initial Note (Signed)
 Transition of Care [facility]) - Initial/Assessment Note    Patient Details  Name: Leah Davies MRN: [id number] Date of Birth: [birth year]  Transition of Care [facility]) CM/SW Contact:    Nathanael CHRISTELLA Ring, RN Phone Number: 09/14/2024, 11:27 AM  Clinical Narrative:                 CM met with patient and her daughters at the bedside, introduced self and explained role in discharge planning.  2 of her 3 daughters at the bedside, she is from home where she lives with there 3rd daughter and her husband in [place].  She is independent in ADL's and requires no DME but family reports that they do have equipment at home if needed.  They provide her transportation.  She goes to dialysis at [facility] [address] MWF at 6 am.  She missed a week of treatments when she went to [place].  CM asked if she notified her nephrologist that she was leaving the country and she said yes.  I asked what he told her and she said that he told her she should have a doctor in [place].   Daughter report that they know they made a mistake by going out of the country and not making arrangements for dialysis treatments.   No discharge needs identified at this time.  Patient is scheduled for a cardiac cath today.  Family will transport home at DC.   Expected Discharge Plan: Home/Self Care Barriers to Discharge: Continued Medical Work up   Patient Goals and CMS Choice            Expected Discharge Plan and Services       Living arrangements for the past 2 months: Single Family Home                 DME Arranged: N/A         HH Arranged: NA          Prior Living Arrangements/Services Living arrangements for the past 2 months: Single Family Home Lives with:: Adult Children Patient language and need for interpreter reviewed:: Yes (Spanish interpreter needed) Do you feel safe going back to the place where you live?: Yes      Need for Family Participation in Patient Care: Yes (Comment) Care giver support system in place?: Yes  (comment)   Criminal Activity/Legal Involvement Pertinent to Current Situation/Hospitalization: No - Comment as needed  Activities of Daily Living   ADL Screening (condition at time of admission) Independently performs ADLs?: Yes (appropriate for developmental age) Is the patient deaf or have difficulty hearing?: No Does the patient have difficulty seeing, even when wearing glasses/contacts?: No Does the patient have difficulty concentrating, remembering, or making decisions?: No  Permission Sought/Granted Permission sought to share information with : Family Supports Permission granted to share information with : Yes, Verbal Permission Granted        Permission granted to share info w Relationship: DAughters     Emotional Assessment Appearance:: Appears stated age Attitude/Demeanor/Rapport: Engaged Affect (typically observed): Accepting Orientation: : Oriented to Self, Oriented to Place, Oriented to  Time, Oriented to Situation Alcohol / Substance Use: Not Applicable Psych Involvement: No (comment)  Admission diagnosis:  Hyperkalemia [E87.5] ESRD needing dialysis (HCC) [N18.6, Z99.2] Patient Active Problem List   Diagnosis Date Noted   NSTEMI (non-ST elevated myocardial infarction) (HCC) 09/12/2024   Anemia in ESRD (end-stage renal disease) (HCC) 09/11/2024   Chest pain 04/10/2021   Obesity (BMI 30-39.9) 04/10/2021  Complication from renal dialysis device 09/13/2020   Constipation    ESRD (end stage renal disease) (HCC) 04/10/2020   Nonintractable headache    Metabolic acidosis 04/09/2020   Hypertensive urgency 04/09/2020   AKI (acute kidney injury) 04/09/2020   Acute renal failure superimposed on stage 5 chronic kidney disease, not on chronic dialysis (HCC) 02/23/2020   Uncontrolled hypertension 02/23/2020   Intractable vomiting with nausea 02/19/2020   Hyperkalemia 02/19/2020   Acute lower UTI 02/19/2020   Essential hypertension 02/19/2020   GERD (gastroesophageal  reflux disease) 02/19/2020   Anemia of chronic disease 02/19/2020   Hypocalcemia 02/19/2020   Varicose veins of both lower extremities with inflammation 11/18/2016   Pain in limb 11/18/2016   Chronic venous insufficiency 11/18/2016   Swelling of limb 11/18/2016   Abdominal bloating 06/26/2016   Perioral dermatitis 06/21/2015   Other dorsalgia 04/06/2015   Age-related osteoporosis without current pathological fracture 03/23/2015   Vertigo 12/21/2013   Sebaceous cyst 12/01/2013   Secondary hyperparathyroidism of renal origin 07/27/2012   Vitamin D deficiency 05/19/2012   Chronic kidney disease, stage IV (severe) (HCC) 01/22/2012   Gout 10/23/2011   Diverticulosis of intestine, part unspecified, without perforation or abscess without bleeding 12/03/2010   Hemorrhoids 01/04/2010   Disequilibrium 07/04/2009   Breast lump 02/09/2009   Dermatitis 06/14/2008   Allergic rhinitis 03/08/2008   Hyperlipidemia 03/08/2008   Personal history of other diseases of the digestive system 03/08/2008   Gastro-esophageal reflux disease without esophagitis 03/08/2008   PCP:  Sherial Bail, MD Pharmacy:   [facility] [phone] - [place], [place] - [address] [place] [address] Phone: [phone] Fax: [phone]     Social Drivers of Health (SDOH) Social History: SDOH Screenings   Food Insecurity: No Food Insecurity (09/13/2024)  Housing: Low Risk (09/13/2024)  Transportation Needs: No Transportation Needs (09/13/2024)  Utilities: Not At Risk (09/13/2024)  Financial Resource Strain: Low Risk  (07/08/2024)   Received from [facility]  Tobacco Use: Low Risk (09/13/2024)   SDOH Interventions:     Readmission Risk Interventions     No data to display

## 2024-09-14 NOTE — Progress Notes (Signed)
 ANTICOAGULATION CONSULT NOTE  Pharmacy Consult for heparin  infusion Indication: ACS/STEMI  Allergies[1]  Patient Measurements: Height: 4' 9 (144.8 cm) Weight: 61 kg (134 lb 7.7 oz) IBW/kg (Calculated) : 38.6 HEPARIN  DW (KG): 52.7  Labs: Recent Labs    09/11/24 2130 09/12/24 0240 09/12/24 1032 09/12/24 2216 09/13/24 0627 09/13/24 1613 09/14/24 0546  HGB 10.9*  --   --   --  10.4*  --  10.2*  HCT 33.7*  --   --   --  32.2*  --  31.3*  PLT 178  --   --   --  202  --  192  APTT  --  35  --   --   --   --   --   LABPROT  --  14.2  --   --   --   --   --   INR  --  1.0  --   --   --   --   --   HEPARINUNFRC  --   --  0.26*   < > 0.30 0.33 0.21*  CREATININE 11.30* 11.40* 5.72*  --  7.23* 3.37*  --    < > = values in this interval not displayed.    Estimated Creatinine Clearance: 9.7 mL/min (A) (by C-G formula based on SCr of 3.37 mg/dL (H)).   Medical History: Past Medical History:  Diagnosis Date   Arthritis    Chronic kidney disease    renal insufficiency   Complication of anesthesia    Vital signs low during colonoscopy had trouble to wake up    Elevated lipids    GERD (gastroesophageal reflux disease)    Glaucoma    Gout    HLD (hyperlipidemia)    Hypertension     Assessment: Pt is a 83 yo female presenting to ED c/o CP, weakness, & HA, found with elevated Troponin level, trending up.  1214 1032 HL 0.26, subthera; 700 un/hr 1214 2216 HL 0.35, therapeutic x 1 1215 0627 HL 0.30, barely therapeutic 1215 1613 HL 0.33, therapeutic x 3 1216 0545 HL 0.21, SUBtherapeutic   Goal of Therapy:  Heparin  level 0.3-0.7 units/ml Monitor platelets by anticoagulation protocol: Yes   Plan:  12/16: HL @ 0545 = 0.21, SUBtherapeutic  - will order heparin  800 units IV X 1 bolus and increase drip rate to 950 units/hr - recheck HL 8 hrs after rate change  - Daily CBC per protocol while on IV heparin   Alexandrea Westergard D, PharmD 09/14/2024 6:27 AM          [1]   Allergies Allergen Reactions   Amlodipine  Hypertension, Other (See Comments) and Rash    Caused increased blood pressure  Other reaction(s): kidney failure Caused increased blood pressure    Other reaction(s): Unknown Other reaction(s): Unknown    Caused increased blood pressure   Azithromycin  Other (See Comments)    Unknown  Other reaction(s): Unknown Unknown    unknown    Unknown   Prednisone Other (See Comments)    Pt states she feels like her BP dropped, got dizzy, and very pale.   Tizanidine Other (See Comments)    Dizziness, pt states BP dropped, and very pale.

## 2024-09-14 NOTE — Progress Notes (Signed)
 PROGRESS NOTE    Leah Davies  FMW:[id number] DOB: 27-Apr-1941 DOA: 09/11/2024 PCP: Sherial Bail, MD   Assessment & Plan:   Principal Problem:   Hyperkalemia Active Problems:   ESRD (end stage renal disease) (HCC)   Chest pain   NSTEMI (non-ST elevated myocardial infarction) (HCC)   Essential hypertension   GERD (gastroesophageal reflux disease)   Anemia in ESRD (end-stage renal disease) (HCC)  Assessment and Plan:  Hyperkalemia: secondary to pt missing HD x 1 week. S/p lokelma . Resolved  ESRD: on HD MWF. Missed HD x 1 week. Nephro following and recs apprec    NSTEMI: as per cardio. Troponins are trending up. Cardiac cath today. Cardio following and recs apprec   Elevated D-dimer: CTA chest neg for PE    HTN: continue on losartan , nifedipine     GERD: continue on PPI    ACD: likely secondary to ESRD. Will transfuse if Hb < 7.0    Obesity: BMI 30.1. Would benefit from weight loss     DVT prophylaxis: heparin  drip  Code Status: full  Family Communication: discussed pt's care w/ pt's family at bedside and answered their questions  Disposition Plan: depends on PT/OT recs   Level of care: Progressive  Status is: Inpatient Remains inpatient appropriate because: severity of illness, cardiac cath tomorrow     Consultants:  Cardio   Procedures:  Antimicrobials:    Subjective: Pt c/o malaise   Objective: Vitals:   09/13/24 1545 09/13/24 2002 09/14/24 0432 09/14/24 0500  BP: (!) 141/48 (!) 135/58 (!) 131/59   Pulse: 63 66 61   Resp: 16 16 15    Temp: 98.2 F (36.8 C) (!) 97.5 F (36.4 C) 98.8 F (37.1 C)   TempSrc:  Oral Oral   SpO2: 95% 92% 95%   Weight:    61 kg  Height:        Intake/Output Summary (Last 24 hours) at 09/14/2024 0952 Last data filed at 09/14/2024 0926 Gross per 24 hour  Intake 303.02 ml  Output 1300 ml  Net -996.98 ml    Filed Weights   09/13/24 0500 09/13/24 1421 09/14/24 0500  Weight: 63.1 kg 60 kg 61 kg     Examination:  General exam: appears calm & comfortable  Respiratory system: clear breath sounds b/l  Cardiovascular system: S1 & S2+. No rubs or clicks   Gastrointestinal system: abd is soft, NT, ND, hypoactive bowel sounds  Central nervous system: alert & awake. Moves all extremities  Psychiatry: Judgement and insight appears at baseline. Flat mood and affect   Data Reviewed: I have personally reviewed following labs and imaging studies  CBC: Recent Labs  Lab 09/11/24 2130 09/13/24 0627 09/14/24 0546  WBC 6.5 5.9 6.0  NEUTROABS 4.8  --   --   HGB 10.9* 10.4* 10.2*  HCT 33.7* 32.2* 31.3*  MCV 94.4 93.6 92.6  PLT 178 202 192   Basic Metabolic Panel: Recent Labs  Lab 09/11/24 2130 09/12/24 0240 09/12/24 1032 09/13/24 0627 09/13/24 1613  NA 134* 136 136 135 134*  K 7.1* 6.2* 4.1 4.7 4.0  CL 97* 99 93* 93* 93*  CO2 15* 15* 25 25 26   GLUCOSE 105* 91 84 82 76  BUN 110* 108* 40* 50* 18  CREATININE 11.30* 11.40* 5.72* 7.23* 3.37*  CALCIUM  9.6 9.7 9.4 9.0 9.1  PHOS  --   --   --   --  3.5   GFR: Estimated Creatinine Clearance: 9.7 mL/min (A) (by C-G formula based on  SCr of 3.37 mg/dL (H)). Liver Function Tests: Recent Labs  Lab 09/11/24 2130 09/13/24 1613  AST 16  --   ALT 10  --   ALKPHOS 72  --   BILITOT 0.3  --   PROT 7.9  --   ALBUMIN 4.4 4.1   No results for input(s): LIPASE, AMYLASE in the last 168 hours. No results for input(s): AMMONIA in the last 168 hours. Coagulation Profile: Recent Labs  Lab 09/12/24 0240  INR 1.0   Cardiac Enzymes: No results for input(s): CKTOTAL, CKMB, CKMBINDEX, TROPONINI in the last 168 hours. BNP (last 3 results) No results for input(s): PROBNP in the last 8760 hours. HbA1C: No results for input(s): HGBA1C in the last 72 hours. CBG: Recent Labs  Lab 09/12/24 0425 09/12/24 0535  GLUCAP 97 97   Lipid Profile: No results for input(s): CHOL, HDL, LDLCALC, TRIG, CHOLHDL, LDLDIRECT  in the last 72 hours. Thyroid  Function Tests: No results for input(s): TSH, T4TOTAL, FREET4, T3FREE, THYROIDAB in the last 72 hours. Anemia Panel: No results for input(s): VITAMINB12, FOLATE, FERRITIN, TIBC, IRON, RETICCTPCT in the last 72 hours. Sepsis Labs: No results for input(s): PROCALCITON, LATICACIDVEN in the last 168 hours.  No results found for this or any previous visit (from the past 240 hours).       Radiology Studies: No results found.       Scheduled Meds:  [START ON 09/15/2024] aspirin   81 mg Oral Pre-Cath   aspirin  EC  81 mg Oral Daily   atorvastatin   40 mg Oral Daily   Chlorhexidine  Gluconate Cloth  6 each Topical Q0600   [START ON 09/15/2024] free water   500 mL Oral Once   losartan   50 mg Oral Daily   metoprolol  succinate  25 mg Oral Daily   NIFEdipine   30 mg Oral QHS   pantoprazole   40 mg Oral Daily   scopolamine   1 patch Transdermal Q72H   Continuous Infusions:  heparin  950 Units/hr (09/14/24 0637)     LOS: 1 day     Anthony CHRISTELLA Pouch, MD [facility] Pager [phone]  If 7PM-7AM, please contact night-coverage [email or web address] 09/14/2024, 9:52 AM

## 2024-09-14 NOTE — Progress Notes (Signed)
 [facility]  ROUNDING NOTE   Subjective:   Patient under the care of of [facility] nephrology.  She dialyzes on MWF schedule at [facility].  Has missed dialysis x 1 week as she was visiting [place].  She presented here with severe metabolic derangements including hyperkalemia with potassium of 7.1, acute metabolic acidosis with serum bicarbonate of 15 and azotemia with a BUN of 110.  Update: Patient underwent hemodialysis treatment yesterday.  We are plan for hemodialysis treatment again tomorrow.  Patient due for cardiac procedure today.  Objective:  Vital signs in last 24 hours:  Temp:  [97.1 F (36.2 C)-98.8 F (37.1 C)] 97.1 F (36.2 C) (12/16 1545) Pulse Rate:  [0-66] 54 (12/16 1615) Resp:  [12-19] 17 (12/16 1615) BP: (114-146)/(40-59) 115/45 (12/16 1615) SpO2:  [92 %-99 %] 94 % (12/16 1615) Weight:  [61 kg] 61 kg (12/16 0500)  Weight change: -3.141 kg Filed Weights   09/13/24 0500 09/13/24 1421 09/14/24 0500  Weight: 63.1 kg 60 kg 61 kg    Intake/Output: I/O last 3 completed shifts: In: -  Out: 1300 [Other:1300]   Intake/Output this shift:  Total I/O In: 344.3 [I.V.:344.3] Out: -   Physical Exam: General: No acute distress  Head: Normocephalic, atraumatic. Moist oral mucosal membranes  Neck: Supple  Lungs:  Clear to auscultation, normal effort  Heart: S1S2 no rubs  Abdomen:  Soft, nontender, bowel sounds present  Extremities: Trace peripheral edema.  Neurologic: Awake, alert, following commands  Skin: No acute rash  Access: Left upper extremity AV fistula    Basic Metabolic Panel: Recent Labs  Lab 09/11/24 2130 09/12/24 0240 09/12/24 1032 09/13/24 0627 09/13/24 1613  NA 134* 136 136 135 134*  K 7.1* 6.2* 4.1 4.7 4.0  CL 97* 99 93* 93* 93*  CO2 15* 15* 25 25 26   GLUCOSE 105* 91 84 82 76  BUN 110* 108* 40* 50* 18  CREATININE 11.30* 11.40* 5.72* 7.23* 3.37*  CALCIUM  9.6 9.7 9.4 9.0 9.1  PHOS  --   --   --   --  3.5    Liver  Function Tests: Recent Labs  Lab 09/11/24 2130 09/13/24 1613  AST 16  --   ALT 10  --   ALKPHOS 72  --   BILITOT 0.3  --   PROT 7.9  --   ALBUMIN 4.4 4.1   No results for input(s): LIPASE, AMYLASE in the last 168 hours. No results for input(s): AMMONIA in the last 168 hours.  CBC: Recent Labs  Lab 09/11/24 2130 09/13/24 0627 09/14/24 0546  WBC 6.5 5.9 6.0  NEUTROABS 4.8  --   --   HGB 10.9* 10.4* 10.2*  HCT 33.7* 32.2* 31.3*  MCV 94.4 93.6 92.6  PLT 178 202 192    Cardiac Enzymes: No results for input(s): CKTOTAL, CKMB, CKMBINDEX, TROPONINI in the last 168 hours.  BNP: Invalid input(s): POCBNP  CBG: Recent Labs  Lab 09/12/24 0425 09/12/24 0535  GLUCAP 97 97    Microbiology: Results for orders placed or performed during the hospital encounter of 04/10/21  Resp Panel by RT-PCR (Flu A&B, Covid) Nasopharyngeal Swab     Status: None   Collection Time: 04/10/21 11:56 AM   Specimen: Nasopharyngeal Swab; Nasopharyngeal(NP) swabs in vial transport medium  Result Value Ref Range Status   SARS Coronavirus 2 by RT PCR NEGATIVE NEGATIVE Final    Comment: (NOTE) SARS-CoV-2 target nucleic acids are NOT DETECTED.  The SARS-CoV-2 RNA is generally detectable  in upper respiratory specimens during the acute phase of infection. The lowest concentration of SARS-CoV-2 viral copies this assay can detect is 138 copies/mL. A negative result does not preclude SARS-Cov-2 infection and should not be used as the sole basis for treatment or other patient management decisions. A negative result may occur with  improper specimen collection/handling, submission of specimen other than nasopharyngeal swab, presence of viral mutation(s) within the areas targeted by this assay, and inadequate number of viral copies(<138 copies/mL). A negative result must be combined with clinical observations, patient history, and epidemiological information. The expected result is  Negative.  Fact Sheet for Patients:  bloggercourse.com  Fact Sheet for Healthcare Providers:  seriousbroker.it  This test is no t yet approved or cleared by the [place]  FDA and  has been authorized for detection and/or diagnosis of SARS-CoV-2 by FDA under an Emergency Use Authorization (EUA). This EUA will remain  in effect (meaning this test can be used) for the duration of the COVID-19 declaration under Section 564(b)(1) of the Act, 21 U.S.C.section 360bbb-3(b)(1), unless the authorization is terminated  or revoked sooner.       Influenza A by PCR NEGATIVE NEGATIVE Final   Influenza B by PCR NEGATIVE NEGATIVE Final    Comment: (NOTE) The Xpert Xpress SARS-CoV-2/FLU/RSV plus assay is intended as an aid in the diagnosis of influenza from Nasopharyngeal swab specimens and should not be used as a sole basis for treatment. Nasal washings and aspirates are unacceptable for Xpert Xpress SARS-CoV-2/FLU/RSV testing.  Fact Sheet for Patients: bloggercourse.com  Fact Sheet for Healthcare Providers: seriousbroker.it  This test is not yet approved or cleared by the [place]  FDA and has been authorized for detection and/or diagnosis of SARS-CoV-2 by FDA under an Emergency Use Authorization (EUA). This EUA will remain in effect (meaning this test can be used) for the duration of the COVID-19 declaration under Section 564(b)(1) of the Act, 21 U.S.C. section 360bbb-3(b)(1), unless the authorization is terminated or revoked.  Performed at [facility], [address] [place], [place] [address]     Coagulation Studies: Recent Labs    09/12/24 0240  LABPROT 14.2  INR 1.0    Urinalysis: No results for input(s): COLORURINE, LABSPEC, PHURINE, GLUCOSEU, HGBUR, BILIRUBINUR, KETONESUR, PROTEINUR, UROBILINOGEN, NITRITE, LEUKOCYTESUR in the last 72  hours.  Invalid input(s): APPERANCEUR    Imaging: CARDIAC CATHETERIZATION Result Date: 09/14/2024   Ost LM to Mid LM lesion is 75% stenosed.   Ost Cx to Mid Cx lesion is 80% stenosed.   Prox RCA lesion is 90% stenosed.   There is moderate left ventricular systolic dysfunction.   The left ventricular ejection fraction is 25-35% by visual estimate. 1.  NSTEMI 2.  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  Moderate to severely reduced left ventricular function, with estimated LV ejection fraction 30%, with anterior apical dyskinesis Recommendations 1.  Transfer to [facility] for evaluation for CABG 2.  Resume heparin  drip     Medications:    sodium chloride      heparin  Stopped (09/14/24 1347)    [START ON 09/15/2024] aspirin   81 mg Oral Pre-Cath   [MAR Hold] aspirin  EC  81 mg Oral Daily   [MAR Hold] atorvastatin   40 mg Oral Daily   [MAR Hold] Chlorhexidine  Gluconate Cloth  6 each Topical Q0600   [START ON 09/15/2024] free water   500 mL Oral Once   free water   500  mL Oral Once   [MAR Hold] losartan   50 mg Oral Daily   [MAR Hold] metoprolol  succinate  25 mg Oral Daily   [MAR Hold] NIFEdipine   30 mg Oral QHS   [MAR Hold] pantoprazole   40 mg Oral Daily   [MAR Hold] scopolamine   1 patch Transdermal Q72H   sodium chloride  flush  3 mL Intravenous Q12H   sodium chloride , acetaminophen , [MAR Hold] albuterol , fentaNYL , Heparin  (Porcine) in NaCl, hydrALAZINE , iohexol , lidocaine  (PF), [MAR Hold] meclizine , midazolam  PF, [MAR Hold] nitroGLYCERIN , ondansetron  (ZOFRAN ) IV, [MAR Hold] oxyCODONE -acetaminophen , sodium chloride  flush  Assessment/ Plan:  83 y.o. female with past medical history of ESRD on HD MWF, hypertension, lipidemia, gout, GERD, anemia chronic kidney disease, secondary hyperparathyroidism, gout who presented with chest pain, weakness after missing dialysis x 1 week.  [facility] nephrology/[facility]  [place]/MWF/left upper extremity AV fistula  1.  ESRD on HD MWF.  Upon presentation patient had missed 1 week of hemodialysis as she was vacationing in [place].  Came in with chest pain and found to have severe hyperkalemia with potassium of 7.1.  Patient did undergo urgent dialysis shortly after admission.  Update: Patient completed hemodialysis treatment yesterday.  We will plan for hemodialysis treatment again tomorrow.  2.  Hyperkalemia.  Potassium within acceptable range today 4.0.  Continue periodically monitor.  3.  Anemia of chronic kidney disease.  Hemoglobin 10.2.  Burrell Soulier as outpatient.  Continue to monitor CBC.  4.  Secondary hyperparathyroidism.  Phosphorus 3.5 and acceptable.  Continue to monitor bone metabolism parameters.   LOS: 1 Ronnell Makarewicz 12/16/20254:24 PM  Blood cell

## 2024-09-14 NOTE — Progress Notes (Signed)
 Pt transferred via bed from Room 117 to Special Procedures by orderly; pt Spanish speaking; A&O x4 with family at the bedside

## 2024-09-14 NOTE — Progress Notes (Signed)
 PHARMACY - ANTICOAGULATION CONSULT NOTE  Pharmacy Consult for Heparin  Indication: chest pain/ACS  Allergies[1]  Patient Measurements: Height: 4' 9 (144.8 cm) Weight: 61.4 kg (135 lb 5.8 oz) IBW/kg (Calculated) : 38.6  Vital Signs: Temp: 97.7 F (36.5 C) (12/16 1922) Temp Source: Oral (12/16 1922) BP: 158/54 (12/16 1922) Pulse Rate: 70 (12/16 1922)  Labs: Recent Labs    09/11/24 2130 09/12/24 0240 09/12/24 1032 09/12/24 2216 09/13/24 0627 09/13/24 1613 09/14/24 0546  HGB 10.9*  --   --   --  10.4*  --  10.2*  HCT 33.7*  --   --   --  32.2*  --  31.3*  PLT 178  --   --   --  202  --  192  APTT  --  35  --   --   --   --   --   LABPROT  --  14.2  --   --   --   --   --   INR  --  1.0  --   --   --   --   --   HEPARINUNFRC  --   --  0.26*   < > 0.30 0.33 0.21*  CREATININE 11.30* 11.40* 5.72*  --  7.23* 3.37*  --    < > = values in this interval not displayed.    Estimated Creatinine Clearance: 9.7 mL/min (A) (by C-G formula based on SCr of 3.37 mg/dL (H)).   Assessment: 83 yo female with medical history significant for ESRD on HD MWF who presented to ED with c/o CP, weakness, & HA, found with elevated Troponin level elevated, trending up. Patient to be evaluated by CT surgery in the morning. Pharmacy consulted to resume IV heparin .  Goal of Therapy:  Heparin  level 0.3-0.7 units/ml Monitor platelets by anticoagulation protocol: Yes   Plan:  Restart heparin  infusion at 950 units/hr Check heparin  level in 8 hours and daily while on heparin  Continue to monitor H&H and platelets  Thank you for allowing pharmacy to be a part of this patients care.  Shelba Collier, PharmD, BCPS Clinical Pharmacist    [1]  Allergies Allergen Reactions   Amlodipine  Hypertension, Other (See Comments) and Rash    Caused increased blood pressure  Other reaction(s): kidney failure Caused increased blood pressure    Other reaction(s): Unknown Other reaction(s): Unknown     Caused increased blood pressure   Azithromycin  Other (See Comments)    Unknown  Other reaction(s): Unknown Unknown    unknown    Unknown   Prednisone Other (See Comments)    Pt states she feels like her BP dropped, got dizzy, and very pale.   Tizanidine Other (See Comments)    Dizziness, pt states BP dropped, and very pale.

## 2024-09-15 ENCOUNTER — Telehealth (HOSPITAL_COMMUNITY): Payer: Self-pay

## 2024-09-15 ENCOUNTER — Other Ambulatory Visit (HOSPITAL_COMMUNITY): Payer: Self-pay

## 2024-09-15 ENCOUNTER — Encounter: Payer: Self-pay | Admitting: Cardiology

## 2024-09-15 DIAGNOSIS — Z992 Dependence on renal dialysis: Secondary | ICD-10-CM

## 2024-09-15 DIAGNOSIS — I25110 Atherosclerotic heart disease of native coronary artery with unstable angina pectoris: Secondary | ICD-10-CM

## 2024-09-15 LAB — ECHOCARDIOGRAM COMPLETE
AR max vel: 1.9 cm2
AV Area VTI: 1.72 cm2
AV Area mean vel: 1.89 cm2
AV Mean grad: 4 mmHg
AV Peak grad: 7.3 mmHg
Ao pk vel: 1.35 m/s
Area-P 1/2: 2.91 cm2
Calc EF: 62 %
Height: 57 in
MV VTI: 2.19 cm2
S' Lateral: 2.9 cm
Single Plane A2C EF: 69.8 %
Single Plane A4C EF: 52.3 %
Weight: 2227.21 [oz_av]

## 2024-09-15 LAB — CBC
HCT: 31.1 % — ABNORMAL LOW (ref 36.0–46.0)
Hemoglobin: 10.4 g/dL — ABNORMAL LOW (ref 12.0–15.0)
MCH: 31 pg (ref 26.0–34.0)
MCHC: 33.4 g/dL (ref 30.0–36.0)
MCV: 92.8 fL (ref 80.0–100.0)
Platelets: 193 K/uL (ref 150–400)
RBC: 3.35 MIL/uL — ABNORMAL LOW (ref 3.87–5.11)
RDW: 13 % (ref 11.5–15.5)
WBC: 7.2 K/uL (ref 4.0–10.5)
nRBC: 0 % (ref 0.0–0.2)

## 2024-09-15 LAB — LIPID PANEL
Cholesterol: 179 mg/dL (ref 0–200)
HDL: 36 mg/dL — ABNORMAL LOW (ref >40)
LDL Cholesterol: 104 mg/dL — ABNORMAL HIGH (ref 0–99)
Total CHOL/HDL Ratio: 5 ratio
Triglycerides: 200 mg/dL — ABNORMAL HIGH (ref <150)
VLDL: 40 mg/dL (ref 0–40)

## 2024-09-15 LAB — BASIC METABOLIC PANEL WITH GFR
Anion gap: 16 — ABNORMAL HIGH (ref 5–15)
BUN: 39 mg/dL — ABNORMAL HIGH (ref 8–23)
CO2: 21 mmol/L — ABNORMAL LOW (ref 22–32)
Calcium: 9 mg/dL (ref 8.9–10.3)
Chloride: 93 mmol/L — ABNORMAL LOW (ref 98–111)
Creatinine, Ser: 6.61 mg/dL — ABNORMAL HIGH (ref 0.44–1.00)
GFR, Estimated: 6 mL/min — ABNORMAL LOW (ref >60)
Glucose, Bld: 79 mg/dL (ref 70–99)
Potassium: 4.5 mmol/L (ref 3.5–5.1)
Sodium: 130 mmol/L — ABNORMAL LOW (ref 135–145)

## 2024-09-15 LAB — HEPARIN LEVEL (UNFRACTIONATED): Heparin Unfractionated: 0.32 [IU]/mL (ref 0.30–0.70)

## 2024-09-15 MED ORDER — TICAGRELOR 90 MG PO TABS
90.0000 mg | ORAL_TABLET | Freq: Two times a day (BID) | ORAL | Status: DC
  Administered 2024-09-15 – 2024-09-18 (×6): 90 mg via ORAL
  Filled 2024-09-15 (×6): qty 1

## 2024-09-15 MED ORDER — TICAGRELOR 90 MG PO TABS
180.0000 mg | ORAL_TABLET | Freq: Once | ORAL | Status: AC
  Administered 2024-09-15: 15:00:00 180 mg via ORAL
  Filled 2024-09-15: qty 2

## 2024-09-15 MED ORDER — CHLORHEXIDINE GLUCONATE CLOTH 2 % EX PADS
6.0000 | MEDICATED_PAD | Freq: Every day | CUTANEOUS | Status: DC
  Administered 2024-09-15 – 2024-09-16 (×2): 6 via TOPICAL

## 2024-09-15 NOTE — Progress Notes (Signed)
 PHARMACY - ANTICOAGULATION CONSULT NOTE  Pharmacy Consult for Heparin  Indication: chest pain/ACS  Allergies[1]  Patient Measurements: Height: 4' 9 (144.8 cm) Weight: 63.3 kg (139 lb 8.8 oz) IBW/kg (Calculated) : 38.6  Vital Signs: Temp: 98.4 F (36.9 C) (12/17 0425) Temp Source: Oral (12/17 0425) BP: 92/46 (12/17 0538) Pulse Rate: 54 (12/17 0425)  Labs: Recent Labs    09/12/24 1032 09/12/24 2216 09/13/24 0627 09/13/24 1613 09/14/24 0546 09/15/24 0702  HGB  --    < > 10.4*  --  10.2* 10.4*  HCT  --   --  32.2*  --  31.3* 31.1*  PLT  --   --  202  --  192 193  HEPARINUNFRC 0.26*   < > 0.30 0.33 0.21* 0.32  CREATININE 5.72*  --  7.23* 3.37*  --   --    < > = values in this interval not displayed.    Estimated Creatinine Clearance: 9.9 mL/min (A) (by C-G formula based on SCr of 3.37 mg/dL (H)).   Assessment: 83 yo female with medical history significant for ESRD on HD MWF who presented to ED with c/o CP, weakness, & HA, found with elevated Troponin level elevated, trending up. She is s/p cath with multivessel CAD and for CABG evaluation  -heparin  level = 0.32 and at goal on 950 units/hr  Goal of Therapy:  Heparin  level 0.3-0.7 units/ml Monitor platelets by anticoagulation protocol: Yes   Plan:  Continue heparin  infusion at 950 units/hr Daily heparin  level and CBC  Thank you for allowing pharmacy to be a part of this patients care.  Shelba Collier, PharmD, BCPS Clinical Pharmacist     [1]  Allergies Allergen Reactions   Amlodipine  Hypertension, Other (See Comments) and Rash    Caused increased blood pressure  Other reaction(s): kidney failure Caused increased blood pressure    Other reaction(s): Unknown Other reaction(s): Unknown    Caused increased blood pressure   Azithromycin  Other (See Comments)    Unknown  Other reaction(s): Unknown Unknown    unknown    Unknown   Prednisone Other (See Comments)    Pt states she feels like her BP  dropped, got dizzy, and very pale.   Tizanidine Other (See Comments)    Dizziness, pt states BP dropped, and very pale.

## 2024-09-15 NOTE — Consult Note (Signed)
 Renal Service Consult Note [facility] Lamar JONETTA Fret, MD  Patient: Leah Davies Date: 09/15/2024 Requesting Physician: Dr. Kate  Reason for Consult: ESRD pt w/  HPI: The patient is a 83 y.o. year-old w/ PMH as below who presented to OSH [facility]) with chest pain on 09/11/24. She had missed 1 whole week of HD while on vacation in [place]. Usual HD is MWF in [place] [address] [place]. Pt was admitted and dialyzed on 12/13 for hyperkalemia and azotemia, and again dialyzed on 12/15 Monday. LHC was done 12/16 which showed 3V CAD. LVEF was 25-35%. Pt was tx'd to [facility] for CABG evaluation. Pt was admitted at [facility]. We are asked to see for dialysis.    Pt seen in room, dtr is interpreting. Pt has no further CP, no SOB, no leg swelling. On HD about 4 yrs, using LUA AVF. Not sure her kidney doctor's name.    ROS - denies CP, no joint pain, no HA, no blurry vision, no rash, no diarrhea, no nausea/ vomiting   Past Medical History  Past Medical History:  Diagnosis Date   Arthritis    Chronic kidney disease    renal insufficiency   Complication of anesthesia    Vital signs low during colonoscopy had trouble to wake up    Elevated lipids    GERD (gastroesophageal reflux disease)    Glaucoma    Gout    HLD (hyperlipidemia)    Hypertension    Past Surgical History  Past Surgical History:  Procedure Laterality Date   A/V FISTULAGRAM Left 08/22/2020   Procedure: A/V FISTULAGRAM;  Surgeon: Jama Cordella MATSU, MD;  Location: [facility] INVASIVE CV LAB;  Service: Cardiovascular;  Laterality: Left;   A/V FISTULAGRAM Left 01/02/2021   Procedure: A/V FISTULAGRAM;  Surgeon: Jama Cordella MATSU, MD;  Location: [facility] INVASIVE CV LAB;  Service: Cardiovascular;  Laterality: Left;   A/V FISTULAGRAM Left 04/11/2021   Procedure: A/V Fistulagram;  Surgeon: Marea Selinda RAMAN, MD;  Location: [facility] INVASIVE CV LAB;  Service: Cardiovascular;  Laterality: Left;   A/V FISTULAGRAM Left 05/12/2023   Procedure: A/V  Fistulagram;  Surgeon: Marea Selinda RAMAN, MD;  Location: [facility] INVASIVE CV LAB;  Service: Cardiovascular;  Laterality: Left;   AV FISTULA PLACEMENT Left 07/28/2020   Procedure: ARTERIOVENOUS (AV) FISTULA CREATION (BRACHIAL CEPHALIC );  Surgeon: Jama Cordella MATSU, MD;  Location: [facility] ORS;  Service: Vascular;  Laterality: Left;   BREAST EXCISIONAL BIOPSY Left 2010   benign   BREAST SURGERY     Lt breast biopsy   DIALYSIS/PERMA CATHETER INSERTION N/A 04/10/2020   Procedure: DIALYSIS/PERMA CATHETER INSERTION;  Surgeon: Marea Selinda RAMAN, MD;  Location: [facility] INVASIVE CV LAB;  Service: Cardiovascular;  Laterality: N/A;   DIALYSIS/PERMA CATHETER REMOVAL N/A 01/30/2021   Procedure: DIALYSIS/PERMA CATHETER REMOVAL;  Surgeon: Jama Cordella MATSU, MD;  Location: [facility] INVASIVE CV LAB;  Service: Cardiovascular;  Laterality: N/A;   KNEE ARTHROSCOPY Left 10/05/2015   Procedure: ARTHROSCOPY KNEE partial medial and lateral meniscectomy, lateral release for sublux patella;  Surgeon: Ozell Flake, MD;  Location: [facility] ORS;  Service: Orthopedics;  Laterality: Left;   LEFT HEART CATH AND CORONARY ANGIOGRAPHY N/A 09/14/2024   Procedure: LEFT HEART CATH AND CORONARY ANGIOGRAPHY;  Surgeon: Ammon Blunt, MD;  Location: [facility] INVASIVE CV LAB;  Service: Cardiovascular;  Laterality: N/A;   Family History  Family History  Problem Relation Age of Onset   Breast cancer Sister 55   Social History  reports that she has never smoked.  She has never used smokeless tobacco. She reports that she does not currently use alcohol. She reports that she does not use drugs. Allergies Allergies[1] Home medications Prior to Admission medications  Medication Sig Start Date End Date Taking? Authorizing Provider  acetaminophen  (TYLENOL ) 500 MG tablet Take 1,000 mg by mouth every 6 (six) hours as needed.    [provider]  aspirin  81 MG EC tablet Take 1 tablet by mouth daily. 04/12/21   [provider]  calcium  acetate (PHOSLO)  667 MG tablet Take 667 mg by mouth 3 (three) times daily.    [provider]  heparin  25000 UT/250ML infusion Inject 950 Units/hr into the vein continuous. 09/14/24   Trudy Anthony HERO, MD  losartan  (COZAAR ) 50 MG tablet Take 50 mg by mouth daily.    [provider]  meclizine  (ANTIVERT ) 25 MG tablet TAKE 1 TABLET BY MOUTH 3 TIMES DAILY AS NEEDED FOR DIZZINESS OR NAUSEA. 03/26/23   [provider]  metoprolol  succinate (TOPROL -XL) 25 MG 24 hr tablet Take 1 tablet (25 mg total) by mouth daily. 09/15/24   Trudy Anthony HERO, MD  multivitamin (RENA-VIT) TABS tablet Take 1 tablet by mouth daily.    [provider]  NIFEdipine  (PROCARDIA -XL/NIFEDICAL-XL) 30 MG 24 hr tablet Take 30 mg by mouth at bedtime.    [provider]  omeprazole (PRILOSEC) 20 MG capsule Take 20 mg by mouth daily as needed (reflux).  08/18/20   [provider]     Vitals:   09/14/24 2320 09/15/24 0425 09/15/24 0538 09/15/24 0800  BP: (!) 126/44 (!) 107/42 (!) 92/46 (!) 116/44  Pulse: 60 (!) 54  (!) 51  Resp: 18 18  16   Temp: 98.7 F (37.1 C) 98.4 F (36.9 C)  98.4 F (36.9 C)  TempSrc: Oral Oral  Oral  SpO2: 92%     Weight:  63.3 kg    Height:       Exam Gen alert, no distress, on RA Sclera anicteric, throat clear  No jvd or bruits Chest clear bilat to bases RRR no MRG Abd soft ntnd no mass or ascites +bs Ext no LE or UE edema, no other edema Neuro is alert, Ox 3 , nf    LUA aVF+bruit   Home bp meds: Losartan  Metoprolol  XL Procardia  XL    OP HD: MWF [facility]    Assessment/ Plan: NSTEMI: w/ LHC showing 3V CAD, EF 25-35%. Transferred here for CABG evaluation.  ESRD: on HD MWF. Missed a week of HD while out of town. Admitted 12/13 w/ [name] and was dialyzed on 12/13 then again on 12/15 at [facility]. Next HD today.  HTN: bp's are normal to low-normal. Getting low-dose coreg  and procardia  xl 30 every day.  Volume: no sig vol excess, get OP records. UF  2 L as tolerated.  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        Myer Fret  MD [facility] 09/15/2024, 10:39 AM  Recent Labs  Lab 09/11/24 2130 09/12/24 0240 09/13/24 1613 09/14/24 0546 09/15/24 0702  HGB 10.9*   < >  --  10.2* 10.4*  ALBUMIN 4.4  --  4.1  --   --   CALCIUM  9.6   < > 9.1  --  9.0  PHOS  --   --  3.5  --   --   CREATININE 11.30*   < > 3.37*  --  6.61*  K 7.1*   < > 4.0  --  4.5   < > = values in this interval not displayed.   Inpatient medications:  aspirin  EC  81 mg Oral Daily   atorvastatin   80 mg Oral Daily   carvedilol   3.125 mg Oral BID WC   NIFEdipine   30 mg Oral Daily   pantoprazole   20 mg Oral Daily    heparin  950 Units/hr (09/15/24 0319)   acetaminophen , melatonin, nitroGLYCERIN , ondansetron  (ZOFRAN ) IV      [1]  Allergies Allergen Reactions   Amlodipine  Hypertension, Other (See Comments) and Rash    Caused increased blood pressure  Other reaction(s): kidney failure Caused increased blood pressure    Other reaction(s): Unknown Other reaction(s): Unknown    Caused increased blood pressure   Azithromycin  Other (See Comments)    Unknown  Other reaction(s): Unknown Unknown    unknown    Unknown   Prednisone Other (See Comments)    Pt states she feels like her BP dropped, got dizzy, and very pale.   Tizanidine Other (See Comments)    Dizziness, pt states BP dropped, and very pale.

## 2024-09-15 NOTE — Progress Notes (Addendum)
 Patient Name: Leah Davies Date of Encounter: 09/15/2024 [facility] Cardiologist: None  09/14/2024 .admit Length of stay: 1  Interval Summary  .    Leah Davies is a 83 y.o. Hispanic female patient with end-stage renal disease on hemodialysis via left arm AV fistula, primary hypertension, hypercholesterolemia not on therapy, GERD, anemia of chronic disease was transferred from [facility] to [facility] for evaluation for CABG.  Presented with NSTEMI and underwent cardiac catheterization on 09/14/2024.  Patient is presently asymptomatic without chest pain.  Physical Exam    Vitals:   09/14/24 2320 09/15/24 0425 09/15/24 0538 09/15/24 0800  BP: (!) 126/44 (!) 107/42 (!) 92/46 (!) 116/44  Pulse: 60 (!) 54  (!) 51  Resp: 18 18  16   Temp: 98.7 F (37.1 C) 98.4 F (36.9 C)  98.4 F (36.9 C)  TempSrc: Oral Oral  Oral  SpO2: 92%     Weight:  63.3 kg    Height:       Physical Exam Neck:     Vascular: No carotid bruit or JVD.  Cardiovascular:     Rate and Rhythm: Normal rate and regular rhythm.     Pulses: Intact distal pulses.     Heart sounds: Normal heart sounds. No murmur heard.    No gallop.     Comments: Left arm functioning AV fistula present. Pulmonary:     Effort: Pulmonary effort is normal.     Breath sounds: Normal breath sounds.  Abdominal:     General: Bowel sounds are normal.     Palpations: Abdomen is soft.  Musculoskeletal:     Right lower leg: No edema.     Left lower leg: No edema.        09/15/2024    4:25 AM 09/14/2024    7:22 PM 09/14/2024    5:00 AM  Last 3 Weights  Weight (lbs) 139 lb 8.8 oz 135 lb 5.8 oz 134 lb 7.7 oz  Weight (kg) 63.3 kg 61.4 kg 61 kg      Labs   Lab Results  Component Value Date   NA 130 (L) 09/15/2024   K 4.5 09/15/2024   CO2 21 (L) 09/15/2024   GLUCOSE 79 09/15/2024   BUN 39 (H) 09/15/2024   CREATININE 6.61 (H) 09/15/2024   CALCIUM  9.0 09/15/2024   GFRNONAA 6 (L) 09/15/2024        Latest Ref Rng & Units 09/15/2024    7:02 AM 09/13/2024    4:13 PM 09/13/2024    6:27 AM  BMP  Glucose 70 - 99 mg/dL 79  76  82   BUN 8 - 23 mg/dL 39  18  50   Creatinine 0.44 - 1.00 mg/dL 3.38  6.62  2.76   Sodium 135 - 145 mmol/L 130  134  135   Potassium 3.5 - 5.1 mmol/L 4.5  4.0  4.7   Chloride 98 - 111 mmol/L 93  93  93   CO2 22 - 32 mmol/L 21  26  25    Calcium  8.9 - 10.3 mg/dL 9.0  9.1  9.0        Latest Ref Rng & Units 09/15/2024    7:02 AM 09/14/2024    5:46 AM 09/13/2024    6:27 AM  CBC  WBC 4.0 - 10.5 K/uL 7.2  6.0  5.9   Hemoglobin 12.0 - 15.0 g/dL 89.5  89.7  89.5   Hematocrit 36.0 - 46.0 % 31.1  31.3  32.2   Platelets 150 - 400 K/uL 193  192  202     Lab Results  Component Value Date   CHOL 179 09/15/2024   HDL 36 (L) 09/15/2024   LDLCALC 104 (H) 09/15/2024   TRIG 200 (H) 09/15/2024   CHOLHDL 5.0 09/15/2024   TSH 02/24/2023: Normal at 2.727.  Tele/EKG/Cardiac studies    Telemetry: Normal sinus rhythm per  CARDIAC CATHETERIZATION 09/14/2024 Left main appears to have ostial 20 to 30% stenosis and distal 20 to 30% stenosis.  Diffuse disease in the LAD and a very large D1 with diffuse disease and is subtotally occluded from mid to distal segment and filled by collaterals, AV groove circumflex diffusely diseased and again diabetic looking vessel.  Focal stenosis in the proximal RCA, very large PL branch supplying a large lateral wall.          Radiology   Current Meds:    Current Medications[1]  Assessment & Plan .     1.  NSTEMI 2.  Coronary artery disease of native vessel with unstable angina pectoralis 3.  End-stage renal disease on hemodialysis 4.  Hypercholesterolemia  Recommendations: Echocardiogram reviewed, patient has apical lateral akinesis and LVEF appears to be about 40 to 45% preliminarily.  Although wall motion abnormality is in the apical lateral wall, suspect this could be related to chronically occluded and diffusely diseased large D1.   NSTEMI culprit appears to be right coronary artery.  Left main does not appear to be critically stenosed, good reflux of dye evident, patient also not on any medical therapy from cardiac standpoint.  Patient does not want to have blood transfusions due to her religious beliefs.  She would be a very poor candidate for CABG as well in view of her markedly sedentary lifestyle, advanced age, diffusely diseased vessels and left main does not appear critical.  Best option is to proceed with PCI to the right coronary artery.  Discussed with Marolyn Dooms who is in agreement as well and will also start her on GDMT. Will load the patient with Brilinta .  Continue high intensity statins and blood pressure control.  Will have nephrology team see the patient for dialysis, schedule PCI for tomorrow.  Patient's granddaughter translated the language for me, patient's son present at the bedside as well who does not speak English.  They understand risks and benefits of PCI. Schedule for cardiac catheterization, and possible angioplasty. We discussed regarding risks, benefits, alternatives to this including stress testing, CTA and continued medical therapy. Patient wants to proceed. Understands <1-2% risk of death, stroke, MI, urgent CABG, bleeding, infection, renal failure but not limited to these.  Patient is full code.  For questions or updates, please contact  [facility] Please consult [email or web address] for contact info under      Signed,   Gordy Bergamo, MD, [facility] 09/15/2024, 12:52 PM [facility] [address] [place], [place] [address] Phone: [phone]. Fax:  [phone]       [1]  Current Facility-Administered Medications:    acetaminophen  (TYLENOL ) tablet 650 mg, 650 mg, Oral, Q4H PRN, Garrick Leontine SAILOR, PA-C, 650 mg at 09/15/24 0155   aspirin  EC tablet 81 mg, 81 mg, Oral, Daily, Garrick Leontine SAILOR, PA-C, 81 mg at 09/15/24 9083   atorvastatin  (LIPITOR ) tablet 80 mg, 80 mg, Oral, Daily,  Garrick Leontine SAILOR, PA-C, 80 mg at 09/15/24 9083   carvedilol  (COREG ) tablet 3.125 mg, 3.125 mg, Oral, BID WC, Garrick Leontine SAILOR, PA-C, 3.125 mg at 09/14/24 2152  Chlorhexidine  Gluconate Cloth 2 % PADS 6 each, 6 each, Topical, Q0600, Geralynn Charleston, MD, 6 each at 09/15/24 1226   heparin  ADULT infusion 100 units/mL (25000 units/250mL), 950 Units/hr, Intravenous, Continuous, Schuman, Christanna R, MD, Last Rate: 9.5 mL/hr at 09/15/24 0319, 950 Units/hr at 09/15/24 0319   melatonin tablet 3 mg, 3 mg, Oral, QHS PRN, Ingram, Damarcus A, MD, 3 mg at 09/14/24 2359   NIFEdipine  (PROCARDIA -XL/NIFEDICAL-XL) 24 hr tablet 30 mg, 30 mg, Oral, Daily, Lockwood, Logan N, PA-C, 30 mg at 09/15/24 9083   nitroGLYCERIN  (NITROSTAT ) SL tablet 0.4 mg, 0.4 mg, Sublingual, Q5 Min x 3 PRN, Garrick, Logan N, PA-C   ondansetron  (ZOFRAN ) injection 4 mg, 4 mg, Intravenous, Q6H PRN, Garrick Leontine SAILOR, PA-C   pantoprazole  (PROTONIX ) EC tablet 20 mg, 20 mg, Oral, Daily, Lockwood, Logan N, PA-C, 20 mg at 09/15/24 [phone]

## 2024-09-15 NOTE — Telephone Encounter (Signed)
 Pharmacy Patient Advocate Encounter  Insurance verification completed.    The patient is insured through [employer]. Patient has Medicare and is not eligible for a copay card, but may be able to apply for patient assistance or Medicare RX Payment Plan (Patient Must reach out to their plan, if eligible for payment plan), if available.    Ran test claim for Brilinta  90mg  tablet and the current 30 day co-pay is $0.   This test claim was processed through [employer]- copay amounts may vary at other pharmacies due to [employer], or as the patient moves through the different stages of their insurance plan.

## 2024-09-15 NOTE — Clinical Note (Incomplete)
 Pt receives out-pt HD at [facility], MWF, chair time. Have requested the clinic fax orders to HD

## 2024-09-15 NOTE — Progress Notes (Signed)
 Pt receives out-pt HD at [facility], [place], 9384 chair time. She missed a week of tx due to being on vacation. Have requested clinic to fax over orders and med list at this time. Nephrology updated. Will continue to assist as needed.   Leah Davies Para Dialysis Navigator [phone]

## 2024-09-15 NOTE — Plan of Care (Signed)
   Problem: Clinical Measurements: Goal: Ability to maintain clinical measurements within normal limits will improve Outcome: Progressing Goal: Diagnostic test results will improve Outcome: Progressing   Problem: Safety: Goal: Ability to remain free from injury will improve Outcome: Progressing

## 2024-09-15 NOTE — Plan of Care (Signed)
   Problem: Education: Goal: Knowledge of General Education information will improve Description: Including pain rating scale, medication(s)/side effects and non-pharmacologic comfort measures Outcome: Progressing   Problem: Activity: Goal: Risk for activity intolerance will decrease Outcome: Progressing   Problem: Nutrition: Goal: Adequate nutrition will be maintained Outcome: Progressing

## 2024-09-16 ENCOUNTER — Inpatient Hospital Stay (HOSPITAL_COMMUNITY): Payer: Self-pay | Source: Other Acute Inpatient Hospital | Attending: Cardiology

## 2024-09-16 ENCOUNTER — Encounter (HOSPITAL_COMMUNITY): Payer: Self-pay | Admitting: Cardiology

## 2024-09-16 DIAGNOSIS — E7800 Pure hypercholesterolemia, unspecified: Secondary | ICD-10-CM

## 2024-09-16 HISTORY — PX: CORONARY STENT INTERVENTION: CATH118234

## 2024-09-16 HISTORY — PX: CORONARY ULTRASOUND/IVUS: CATH118244

## 2024-09-16 LAB — BASIC METABOLIC PANEL WITH GFR
Anion gap: 11 (ref 5–15)
BUN: 22 mg/dL (ref 8–23)
CO2: 28 mmol/L (ref 22–32)
Calcium: 8.7 mg/dL — ABNORMAL LOW (ref 8.9–10.3)
Chloride: 93 mmol/L — ABNORMAL LOW (ref 98–111)
Creatinine, Ser: 4.45 mg/dL — ABNORMAL HIGH (ref 0.44–1.00)
GFR, Estimated: 9 mL/min — ABNORMAL LOW (ref >60)
Glucose, Bld: 94 mg/dL (ref 70–99)
Potassium: 4 mmol/L (ref 3.5–5.1)
Sodium: 132 mmol/L — ABNORMAL LOW (ref 135–145)

## 2024-09-16 LAB — CBC
HCT: 28.6 % — ABNORMAL LOW (ref 36.0–46.0)
Hemoglobin: 9.7 g/dL — ABNORMAL LOW (ref 12.0–15.0)
MCH: 30.8 pg (ref 26.0–34.0)
MCHC: 33.9 g/dL (ref 30.0–36.0)
MCV: 90.8 fL (ref 80.0–100.0)
Platelets: 177 K/uL (ref 150–400)
RBC: 3.15 MIL/uL — ABNORMAL LOW (ref 3.87–5.11)
RDW: 13 % (ref 11.5–15.5)
WBC: 7.3 K/uL (ref 4.0–10.5)
nRBC: 0 % (ref 0.0–0.2)

## 2024-09-16 LAB — POCT ACTIVATED CLOTTING TIME
Activated Clotting Time: 317 s
Activated Clotting Time: 301 s
Activated Clotting Time: 215 s
Activated Clotting Time: 189 s
Activated Clotting Time: 179 s

## 2024-09-16 LAB — LIPOPROTEIN A (LPA): Lipoprotein (a): 41.6 nmol/L — ABNORMAL HIGH (ref <75.0)

## 2024-09-16 SURGERY — CORONARY STENT INTERVENTION
Anesthesia: LOCAL

## 2024-09-16 MED ORDER — LABETALOL HCL 5 MG/ML IV SOLN: 10.0000 mg | INTRAVENOUS | Status: AC | PRN

## 2024-09-16 MED ORDER — MIDAZOLAM HCL (PF) 2 MG/2ML IJ SOLN
INTRAMUSCULAR | Status: DC | PRN
  Administered 2024-09-16 (×2): 1 mg via INTRAVENOUS

## 2024-09-16 MED ORDER — LIDOCAINE HCL (PF) 1 % IJ SOLN
INTRAMUSCULAR | Status: DC | PRN
  Administered 2024-09-16: 12:00:00 10 mL via INTRADERMAL

## 2024-09-16 MED ORDER — CHLORHEXIDINE GLUCONATE CLOTH 2 % EX PADS: 6.0000 | MEDICATED_PAD | Freq: Every day | CUTANEOUS | Status: DC

## 2024-09-16 MED ORDER — ACETAMINOPHEN 325 MG PO TABS
ORAL_TABLET | ORAL | Status: AC
  Filled 2024-09-16: qty 2

## 2024-09-16 MED ORDER — FENTANYL CITRATE (PF) 100 MCG/2ML IJ SOLN
INTRAMUSCULAR | Status: AC
  Filled 2024-09-16: qty 2

## 2024-09-16 MED ORDER — FENTANYL CITRATE (PF) 100 MCG/2ML IJ SOLN
25.0000 ug | Freq: Once | INTRAMUSCULAR | Status: AC
  Administered 2024-09-16: 15:00:00 25 ug via INTRAVENOUS

## 2024-09-16 MED ORDER — FENTANYL CITRATE (PF) 100 MCG/2ML IJ SOLN
INTRAMUSCULAR | Status: DC | PRN
  Administered 2024-09-16 (×2): 25 ug via INTRAVENOUS

## 2024-09-16 MED ORDER — HYDRALAZINE HCL 20 MG/ML IJ SOLN: 10.0000 mg | INTRAMUSCULAR | Status: AC | PRN

## 2024-09-16 MED ORDER — SODIUM CHLORIDE 0.9% FLUSH
3.0000 mL | Freq: Two times a day (BID) | INTRAVENOUS | Status: DC
  Administered 2024-09-16: 12:00:00 3 mL via INTRAVENOUS

## 2024-09-16 MED ORDER — VERAPAMIL HCL 2.5 MG/ML IV SOLN
INTRAVENOUS | Status: AC
  Filled 2024-09-16: qty 2

## 2024-09-16 MED ORDER — NITROGLYCERIN 1 MG/10 ML FOR IR/CATH LAB
INTRA_ARTERIAL | Status: AC
  Filled 2024-09-16: qty 10

## 2024-09-16 MED ORDER — SODIUM CHLORIDE 0.9 % IV SOLN: 250.0000 mL | INTRAVENOUS | Status: DC | PRN

## 2024-09-16 MED ORDER — SODIUM CHLORIDE 0.9% FLUSH: 3.0000 mL | INTRAVENOUS | Status: DC | PRN

## 2024-09-16 MED ORDER — FREE WATER: 250.0000 mL | Freq: Once | Status: DC

## 2024-09-16 MED ORDER — ASPIRIN 81 MG PO CHEW
81.0000 mg | CHEWABLE_TABLET | ORAL | Status: AC
  Administered 2024-09-16: 06:00:00 81 mg via ORAL
  Filled 2024-09-16: qty 1

## 2024-09-16 MED ORDER — IOHEXOL 350 MG/ML SOLN
INTRAVENOUS | Status: DC | PRN
  Administered 2024-09-16: 13:00:00 55 mL

## 2024-09-16 MED ORDER — HEPARIN SODIUM (PORCINE) 1000 UNIT/ML IJ SOLN
INTRAMUSCULAR | Status: AC
  Filled 2024-09-16: qty 10

## 2024-09-16 MED ORDER — METOPROLOL SUCCINATE ER 25 MG PO TB24
25.0000 mg | ORAL_TABLET | Freq: Every day | ORAL | Status: DC
  Administered 2024-09-16 – 2024-09-18 (×3): 25 mg via ORAL
  Filled 2024-09-16 (×3): qty 1

## 2024-09-16 MED ORDER — HEPARIN SODIUM (PORCINE) 1000 UNIT/ML IJ SOLN
INTRAMUSCULAR | Status: DC | PRN
  Administered 2024-09-16: 12:00:00 6500 [IU] via INTRAVENOUS

## 2024-09-16 MED ORDER — SODIUM CHLORIDE 0.9% FLUSH
3.0000 mL | Freq: Two times a day (BID) | INTRAVENOUS | Status: DC
  Administered 2024-09-16 – 2024-09-18 (×4): 3 mL via INTRAVENOUS

## 2024-09-16 MED ORDER — SODIUM CHLORIDE 0.9 % IV SOLN: 250.0000 mL | INTRAVENOUS | Status: AC | PRN

## 2024-09-16 MED ORDER — TICAGRELOR 90 MG PO TABS
ORAL_TABLET | ORAL | Status: DC | PRN
  Administered 2024-09-16: 12:00:00 180 mg via ORAL

## 2024-09-16 MED ORDER — FENTANYL CITRATE (PF) 100 MCG/2ML IJ SOLN
25.0000 ug | Freq: Once | INTRAMUSCULAR | Status: AC
  Administered 2024-09-16: 17:00:00 25 ug via INTRAVENOUS

## 2024-09-16 MED ORDER — MIDAZOLAM HCL 2 MG/2ML IJ SOLN
INTRAMUSCULAR | Status: AC
  Filled 2024-09-16: qty 2

## 2024-09-16 MED FILL — Lidocaine HCl Local Preservative Free (PF) Inj 1%: INTRAMUSCULAR | Qty: 30 | Status: AC

## 2024-09-16 SURGICAL SUPPLY — 19 items
BALLOON EMERGE MR 3.5X12 (BALLOONS) IMPLANT
BALLOON ~~LOC~~ EMERGE MR 4.0X12 (BALLOONS) IMPLANT
CATH IV OPTICROSS HD 3FRX135 (CATHETERS) IMPLANT
CATH LAUNCHER 6FR AL.75 (CATHETERS) IMPLANT
CATH LAUNCHER 6FR JR4 (CATHETERS) IMPLANT
CATHETER LAUNCHER 6FR JR4 SH (CATHETERS) IMPLANT
DRAPE IVUS SLED (BAG) IMPLANT
KIT ENCORE 26 ADVANTAGE (KITS) IMPLANT
KIT HEMO VALVE WATCHDOG (MISCELLANEOUS) IMPLANT
KIT MICROPUNCTURE NIT STIFF (SHEATH) IMPLANT
PACK CARDIAC CATHETERIZATION (CUSTOM PROCEDURE TRAY) ×1 IMPLANT
SET ATX-X65L (MISCELLANEOUS) IMPLANT
SHEATH PINNACLE 6F 10CM (SHEATH) IMPLANT
SHEATH PROBE COVER 6X72 (BAG) IMPLANT
STENT SYNERGY XD 3.50X20 (Permanent Stent) IMPLANT
TUBING CIL FLEX 10 FLL-RA (TUBING) IMPLANT
WIRE EMERALD 3MM-J .035X150CM (WIRE) IMPLANT
WIRE EMERALD ST .035X150CM (WIRE) IMPLANT
WIRE RUNTHROUGH .014X180CM (WIRE) IMPLANT

## 2024-09-16 NOTE — Progress Notes (Signed)
 Patient c/o of nausea. PRN Zofran  given, in addition to ginger ale and saltine crackers.

## 2024-09-16 NOTE — Progress Notes (Signed)
Patient states nausea is improved.

## 2024-09-16 NOTE — Progress Notes (Signed)
 Holding area called us  to bedside for re-eval as patient was complaining of headache and chest pain. Dr. Elmira was in a case and requested re-evaluation. Dr. Ladona came to bedside with me. EKG shows NSR, RBBB, LAFB, ST elevation in V2, subtle ST upsloping I, avL, but this is improved from tracing yesterday. Patient reports that headache has been present since this morning even before cath. No focal neurologic deficits. Received Tylenol . Thankfully headache is beginning to subside. She also states her chest discomfort is subsiding and nearly gone as well. Dr. Ladona examined the patient, reviewed EKG and felt reassured by her clinical status. He gave nurse verbal order for fentanyl  50mcg, to start with 25mg  first to see if this relieves her discomfort fully. He felt patient was clinically stable to proceed onward to 6e.

## 2024-09-16 NOTE — Interval H&P Note (Signed)
 History and Physical Interval Note:  09/16/2024 11:43 AM  Leah Davies  has presented today for surgery, with the diagnosis of cad.  The various methods of treatment have been discussed with the patient and family. After consideration of risks, benefits and other options for treatment, the patient has consented to  Procedures: CORONARY STENT INTERVENTION (N/A) as a surgical intervention.  The patient's history has been reviewed, patient examined, no change in status, stable for surgery.  I have reviewed the patient's chart and labs.  Questions were answered to the patient's satisfaction.     Murphy Duzan J Reeves Musick

## 2024-09-16 NOTE — H&P (View-Only) (Signed)
 Patient Name: Leah Davies Date of Encounter: 09/16/2024 [facility] Cardiologist: Marolyn Dooms, MD 09/14/2024 .admit Length of stay: 2  Interval Summary  .    Leah Davies is a 83 y.o. Hispanic female patient with end-stage renal disease on hemodialysis via left arm AV fistula, primary hypertension, hypercholesterolemia not on therapy, GERD, anemia of chronic disease was transferred from [facility] to [facility] for evaluation for CABG. Presented with NSTEMI and underwent cardiac catheterization on 09/14/2024. Patient is presently asymptomatic without chest pain.   3 of her sisters and brother present at the bedside today.  Physical Exam    Vitals:   09/16/24 0034 09/16/24 0035 09/16/24 0036 09/16/24 0418  BP:  (!) 107/46  113/81  Pulse: (!) 112 92 (!) 107 61  Resp:   19 17  Temp:   97.6 F (36.4 C) (!) 97.4 F (36.3 C)  TempSrc:   Oral Oral  SpO2: 97% 95% 97% 100%  Weight:    64 kg  Height:       Physical Exam Neck:     Vascular: No JVD.  Cardiovascular:     Rate and Rhythm: Normal rate and regular rhythm.     Pulses: Intact distal pulses.     Heart sounds: S1 normal and S2 normal. Murmur heard.     Early systolic murmur is present with a grade of 2/6 at the upper right sternal border.     No gallop.     Comments: Left arm AV fistula present. Pulmonary:     Effort: Pulmonary effort is normal.     Breath sounds: Normal breath sounds.  Abdominal:     General: Bowel sounds are normal.     Palpations: Abdomen is soft.  Musculoskeletal:     Right lower leg: No edema.     Left lower leg: No edema.        09/16/2024    4:18 AM 09/15/2024    4:25 AM 09/14/2024    7:22 PM  Last 3 Weights  Weight (lbs) 141 lb 1.5 oz 139 lb 8.8 oz 135 lb 5.8 oz  Weight (kg) 64 kg 63.3 kg 61.4 kg      Labs   Lab Results  Component Value Date   NA 130 (L) 09/15/2024   K 4.5 09/15/2024   CO2 21 (L) 09/15/2024   GLUCOSE 79 09/15/2024   BUN 39 (H)  09/15/2024   CREATININE 6.61 (H) 09/15/2024   CALCIUM  9.0 09/15/2024   GFRNONAA 6 (L) 09/15/2024       Latest Ref Rng & Units 09/15/2024    7:02 AM 09/13/2024    4:13 PM 09/13/2024    6:27 AM  BMP  Glucose 70 - 99 mg/dL 79  76  82   BUN 8 - 23 mg/dL 39  18  50   Creatinine 0.44 - 1.00 mg/dL 3.38  6.62  2.76   Sodium 135 - 145 mmol/L 130  134  135   Potassium 3.5 - 5.1 mmol/L 4.5  4.0  4.7   Chloride 98 - 111 mmol/L 93  93  93   CO2 22 - 32 mmol/L 21  26  25    Calcium  8.9 - 10.3 mg/dL 9.0  9.1  9.0        Latest Ref Rng & Units 09/16/2024    3:13 AM 09/15/2024    7:02 AM 09/14/2024    5:46 AM  CBC  WBC 4.0 - 10.5 K/uL 7.3  7.2  6.0   Hemoglobin 12.0 - 15.0 g/dL 9.7  89.5  89.7   Hematocrit 36.0 - 46.0 % 28.6  31.1  31.3   Platelets 150 - 400 K/uL 177  193  192     Lab Results  Component Value Date   CHOL 179 09/15/2024   HDL 36 (L) 09/15/2024   LDLCALC 104 (H) 09/15/2024   TRIG 200 (H) 09/15/2024   CHOLHDL 5.0 09/15/2024    Tele/EKG/Cardiac studies    Telemetry: Normal sinus rhythm, occasional PACs 09/16/2024  Echocardiogram 09/15/2024:   1. Left ventricular ejection fraction, by estimation, is 60 to 65%. The left ventricle has normal function. The left ventricle has no regional wall motion abnormalities. The left ventricular internal cavity size was mildly dilated. There is mild left ventricular hypertrophy. Left ventricular diastolic parameters are consistent with Grade I diastolic dysfunction (impaired relaxation).  2. Right ventricular systolic function is mildly reduced. The right ventricular size is moderately enlarged. Mildly increased right ventricular wall thickness.  3. Left atrial size was mild to moderately dilated.  4. Right atrial size was mildly dilated.  5. The mitral valve is grossly normal. Mild mitral valve regurgitation.  6. The aortic valve is calcified. Aortic valve regurgitation is moderate.  CARDIAC CATHETERIZATION 09/14/2024 Left main  appears to have ostial 20 to 30% stenosis and distal 20 to 30% stenosis.   Diffuse disease in the LAD and a very large D1 with diffuse disease and is subtotally occluded from mid to distal segment and filled by collaterals AV groove circumflex diffusely diseased and again diabetic looking vessel.   Focal stenosis in the proximal RCA, very large PL branch supplying a large lateral wall.          Radiology   Current Meds:    Current Medications[1]  Assessment & Plan .     1.  NSTEMI 2.  Multivessel coronary artery disease involving severely diseased very large diagonal that is occluded, diffusely diseased and subtotaled CX, high-grade stenosis there is focal untreatable in the proximal RCA.  Do not suspect high-grade stenosis in the left main, distal left main has 30% stenosis and ostial left main probably 30% stenosis.  Although there was dampening of pressure with catheter engagement, 3.  Hypercholesterolemia 4.  End-stage renal disease on hemodialysis  Recommendations: I have reviewed the angiograms with the patient's family. Patient was not on medical therapy aggressively including statin therapy previously, suspect she has chronic coronary disease as well now presenting with NSTEMI.  Proceed with culprit PCI to the RCA although wall motion abnormality is not a anterolateral wall, images personally reviewed.  This is probably in the distribution of the diagonal occlusion that is chronic.  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  EF appears to be 45 to 50% with distal anterolateral hypokinesis, personally.  Presently on a statin therapy, blood pressure is soft to initiate GDP.  She had hemodialysis yesterday.  For questions or updates, please contact [facility] Please consult [email or web address] for contact info under        Signed,   Gordy Bergamo, MD, [facility] 09/16/2024, 6:57 AM [facility] [address] [place], [place]  [address] Phone: [phone]. Fax:  [phone]       [1]  Current Facility-Administered Medications:    0.9 %  sodium chloride  infusion, 250 mL, Intravenous, PRN, Patwardhan, Manish J, MD   acetaminophen  (TYLENOL ) tablet 650 mg, 650 mg, Oral, Q4H PRN, Garrick Leontine SAILOR,  PA-C, 650 mg at 09/15/24 0155   aspirin  EC tablet 81 mg, 81 mg, Oral, Daily, Garrick Leontine SAILOR, PA-C, 81 mg at 09/15/24 9083   atorvastatin  (LIPITOR ) tablet 80 mg, 80 mg, Oral, Daily, Garrick Leontine SAILOR, PA-C, 80 mg at 09/15/24 9083   carvedilol  (COREG ) tablet 3.125 mg, 3.125 mg, Oral, BID WC, Garrick Leontine N, PA-C, 3.125 mg at 09/15/24 1550   Chlorhexidine  Gluconate Cloth 2 % PADS 6 each, 6 each, Topical, Q0600, Geralynn Charleston, MD, 6 each at 09/16/24 9375   heparin  ADULT infusion 100 units/mL (25000 units/250mL), 950 Units/hr, Intravenous, Continuous, Schuman, Christanna R, MD, Last Rate: 9.5 mL/hr at 09/15/24 2340, 950 Units/hr at 09/15/24 2340   melatonin tablet 3 mg, 3 mg, Oral, QHS PRN, Ingram, Damarcus A, MD, 3 mg at 09/14/24 2359   NIFEdipine  (PROCARDIA -XL/NIFEDICAL-XL) 24 hr tablet 30 mg, 30 mg, Oral, Daily, Garrick Leontine N, PA-C, 30 mg at 09/15/24 9083   nitroGLYCERIN  (NITROSTAT ) SL tablet 0.4 mg, 0.4 mg, Sublingual, Q5 Min x 3 PRN, Garrick, Logan N, PA-C   ondansetron  (ZOFRAN ) injection 4 mg, 4 mg, Intravenous, Q6H PRN, Garrick Leontine SAILOR, PA-C   pantoprazole  (PROTONIX ) EC tablet 20 mg, 20 mg, Oral, Daily, Garrick Leontine N, PA-C, 20 mg at 09/15/24 0916   sodium chloride  flush (NS) 0.9 % injection 3 mL, 3 mL, Intravenous, Q12H, Patwardhan, Manish J, MD   sodium chloride  flush (NS) 0.9 % injection 3 mL, 3 mL, Intravenous, PRN, Patwardhan, Manish J, MD   ticagrelor  (BRILINTA ) tablet 90 mg, 90 mg, Oral, BID, Henry Shaver B, NP, 90 mg at 09/16/24 [phone]

## 2024-09-16 NOTE — Progress Notes (Signed)
 Patient is confused post procedure. Patients daughter, Leah Davies, explains that the patient had similar experience the last time she received sedation. Other then AMS, patients neuro exam is WNL.

## 2024-09-16 NOTE — Progress Notes (Addendum)
 Dr. Elmira requested the following information be printed out and given to the family: We performed stent placement in right coronary artery.  During the procedure, there was accidental air entry in the contrast tubing.  Fortunately, this did not cause any damage to the flow into the heart artery.  Patient tolerated the procedure well. Updated 6e staff to give corrected copy to family as initial copy had dictation error. Kristen/Marcella with 6e verbalized understanding and will assist with exchanging with family.

## 2024-09-16 NOTE — Progress Notes (Signed)
 [facility] Progress Note   Subjective:    Unable to see patient as she is still in the cath lab. Noted she's supposed to be transferred to 6E. Noted 1.7L was removed from yesterday's HD. Next HD 12/19.  Objective Vitals:   09/16/24 1615 09/16/24 1630 09/16/24 1645 09/16/24 1700  BP: 121/66 (!) 151/28 (!) 146/61 (!) 145/53  Pulse: 63 66    Resp: 15 13 14 13   Temp:      TempSrc:      SpO2: 97% 97%    Weight:      Height:       Physical Exam  *Unable to assess as patient still remains in the cath lab*  Filed Weights   09/14/24 1922 09/15/24 0425 09/16/24 0418  Weight: 61.4 kg 63.3 kg 64 kg    Intake/Output Summary (Last 24 hours) at 09/16/2024 1736 Last data filed at 09/15/2024 2330 Gross per 24 hour  Intake 480 ml  Output 1700 ml  Net -1220 ml    Additional Objective Labs: Basic Metabolic Panel: Recent Labs  Lab 09/13/24 1613 09/15/24 0702 09/16/24 0627  NA 134* 130* 132*  K 4.0 4.5 4.0  CL 93* 93* 93*  CO2 26 21* 28  GLUCOSE 76 79 94  BUN 18 39* 22  CREATININE 3.37* 6.61* 4.45*  CALCIUM  9.1 9.0 8.7*  PHOS 3.5  --   --    Liver Function Tests: Recent Labs  Lab 09/11/24 2130 09/13/24 1613  AST 16  --   ALT 10  --   ALKPHOS 72  --   BILITOT 0.3  --   PROT 7.9  --   ALBUMIN 4.4 4.1   No results for input(s): LIPASE, AMYLASE in the last 168 hours. CBC: Recent Labs  Lab 09/11/24 2130 09/13/24 0627 09/14/24 0546 09/15/24 0702 09/16/24 0313  WBC 6.5 5.9 6.0 7.2 7.3  NEUTROABS 4.8  --   --   --   --   HGB 10.9* 10.4* 10.2* 10.4* 9.7*  HCT 33.7* 32.2* 31.3* 31.1* 28.6*  MCV 94.4 93.6 92.6 92.8 90.8  PLT 178 202 192 193 177   Blood Culture    Component Value Date/Time   SDES  02/19/2020 2038    URINE, RANDOM Performed at [facility], [address] [place], [place] [address]    [facility]  02/19/2020 2038    NONE Performed at [facility], [address] [place], [place] [address]    CULT  >=100,000 COLONIES/mL ESCHERICHIA COLI (A) 02/19/2020 2038   REPTSTATUS 02/22/2020 FINAL 02/19/2020 2038    Cardiac Enzymes: No results for input(s): CKTOTAL, CKMB, CKMBINDEX, TROPONINI in the last 168 hours. CBG: Recent Labs  Lab 09/12/24 0425 09/12/24 0535  GLUCAP 97 97   Iron Studies: No results for input(s): IRON, TIBC, TRANSFERRIN, FERRITIN in the last 72 hours. Lab Results  Component Value Date   INR 1.0 09/12/2024   INR 1.0 07/25/2020   Studies/Results: CARDIAC CATHETERIZATION Addendum Date: 09/16/2024 Coronary intervention 09/16/2024: (Please see detailed coronary angiogram 09/14/2024, except that ostial and distal LM stenosis is 20-30%, not 75%) RCA: Ostial 50%, proxmial 90% stenoses           Successful percutaneous coronary intervention prox RCA       Intravascular ultrasound (IVUS)       PTCA and stent placement 3.5 X 24 mm Synergy drug-eluting stent, deployed at 16 atm       Post dilatation using 3/75 X 12 mm Morton balloon  up to 20 atm       0% residual stenosis at stented segment, TIMI III flow       Prox RCA post PCI MSA 8.2 mm2       Ostial RCA (unstented) MLA 6.1 mm2 Contrast used: 55 cc Accidental air entry in the coronary without any slow flow No neurodeficit Headache was present before accidental air entry and unlikely to related Continue to monitor the patient overnight in telemetry bed [name], MD   Result Date: 09/16/2024 Images from the original result were not included. Coronary intervention 09/16/2024: (Please see detailed coronary angiogram 09/14/2024, except that ostial and distal LM stenosis is 20-30%, not 75%) RCA: Ostial 50%, proxmial 90% stenoses           Successful percutaneous coronary intervention prox RCA       Intravascular ultrasound (IVUS)       PTCA and stent placement 3.5 X 24 mm Synergy drug-eluting stent, deployed at 16 atm       Post dilatation using 3/75 X 12 mm [place] balloon up to 20 atm       0% residual stenosis at  stented segment, TIMI III flow       Prox RCA post PCI MSA 8.2 mm2       Ostial RCA (unstented) MLA 6.1 mm2 Contrast used: 55 cc Accidental air entry in the coronary without any slow flow No neurodeficit Headache was present before accidental air entry and unlikely to related Continue to monitor the patient overnight in telemetry bed [name], MD   Medications:  sodium chloride      sodium chloride      heparin  Stopped (09/16/24 1132)    fentaNYL        acetaminophen        [MAR Hold] aspirin  EC  81 mg Oral Daily   [MAR Hold] atorvastatin   80 mg Oral Daily   [MAR Hold] Chlorhexidine  Gluconate Cloth  6 each Topical Q0600   fentaNYL        free water   250 mL Oral Once   [MAR Hold] metoprolol  succinate  25 mg Oral Daily   nitroGLYCERIN        [MAR Hold] pantoprazole   20 mg Oral Daily   sodium chloride  flush  3 mL Intravenous Q12H   sodium chloride  flush  3 mL Intravenous Q12H   [MAR Hold] ticagrelor   90 mg Oral BID    Dialysis Orders:  MWF [facility]   Assessment/Plan: NSTEMI: w/ LHC showing 3V CAD, EF 25-35%. Transferred here for CABG evaluation. Cardiology following ESRD: on HD MWF. Missed a week of HD while out of town. Admitted 12/13 w/ [name] and was dialyzed on 12/13 then again on 12/15 at [facility]. 1.7L was removed with HD yesterday. Next HD 12/19.  HTN: bp's are normal to low-normal. Getting low-dose coreg  and procardia  xl 30 every day.  Volume: no sig vol excess, get OP records. UF 2 L as tolerated.  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

## 2024-09-16 NOTE — TOC CM/SW Note (Addendum)
 Transition of Care [facility]) - Inpatient Brief Assessment   Patient Details  Name: SANIYYAH ELSTER MRN: [id number] Date of Birth: 02-Jan-1941  Transition of Care [facility]) CM/SW Contact:    Waddell Barnie Rama, RN Phone Number: 09/16/2024, 10:30 AM   Clinical Narrative:  NCM used vedeo interpreter [phone]- From home with daughter, has PCP and insurance on file, states has no HH services in place at this time , has a walker at home.  States family member  (daughter) will transport them home at [employer] and family is support system, states gets medications from [facility] in [place].  Pta self ambulatory , has walker but does not use it per daughter.    Daughter states they would like a PT eval for patient, they want her to have HHPT.  Will let MD know to add PT eval. Patient is going for PCI today.   Transition of Care Asessment: Insurance and Status: Insurance coverage has been reviewed Patient has primary care physician: Yes Home environment has been reviewed: home with daughter Prior level of function:: ambulatory with walker Prior/Current Home Services: Current home services (walker) Social Drivers of Health Review: SDOH reviewed no interventions necessary Readmission risk has been reviewed: Yes Transition of care needs: no transition of care needs at this time

## 2024-09-16 NOTE — Progress Notes (Signed)
 Patient Name: Leah Davies Date of Encounter: 09/16/2024 [facility] Cardiologist: Marolyn Dooms, MD 09/14/2024 .admit Length of stay: 2  Interval Summary  .    Leah Davies is a 83 y.o. Hispanic female patient with end-stage renal disease on hemodialysis via left arm AV fistula, primary hypertension, hypercholesterolemia not on therapy, GERD, anemia of chronic disease was transferred from [facility] to [facility] for evaluation for CABG. Presented with NSTEMI and underwent cardiac catheterization on 09/14/2024. Patient is presently asymptomatic without chest pain.   3 of her sisters and brother present at the bedside today.  Physical Exam    Vitals:   09/16/24 0034 09/16/24 0035 09/16/24 0036 09/16/24 0418  BP:  (!) 107/46  113/81  Pulse: (!) 112 92 (!) 107 61  Resp:   19 17  Temp:   97.6 F (36.4 C) (!) 97.4 F (36.3 C)  TempSrc:   Oral Oral  SpO2: 97% 95% 97% 100%  Weight:    64 kg  Height:       Physical Exam Neck:     Vascular: No JVD.  Cardiovascular:     Rate and Rhythm: Normal rate and regular rhythm.     Pulses: Intact distal pulses.     Heart sounds: S1 normal and S2 normal. Murmur heard.     Early systolic murmur is present with a grade of 2/6 at the upper right sternal border.     No gallop.     Comments: Left arm AV fistula present. Pulmonary:     Effort: Pulmonary effort is normal.     Breath sounds: Normal breath sounds.  Abdominal:     General: Bowel sounds are normal.     Palpations: Abdomen is soft.  Musculoskeletal:     Right lower leg: No edema.     Left lower leg: No edema.        09/16/2024    4:18 AM 09/15/2024    4:25 AM 09/14/2024    7:22 PM  Last 3 Weights  Weight (lbs) 141 lb 1.5 oz 139 lb 8.8 oz 135 lb 5.8 oz  Weight (kg) 64 kg 63.3 kg 61.4 kg      Labs   Lab Results  Component Value Date   NA 130 (L) 09/15/2024   K 4.5 09/15/2024   CO2 21 (L) 09/15/2024   GLUCOSE 79 09/15/2024   BUN 39 (H)  09/15/2024   CREATININE 6.61 (H) 09/15/2024   CALCIUM  9.0 09/15/2024   GFRNONAA 6 (L) 09/15/2024       Latest Ref Rng & Units 09/15/2024    7:02 AM 09/13/2024    4:13 PM 09/13/2024    6:27 AM  BMP  Glucose 70 - 99 mg/dL 79  76  82   BUN 8 - 23 mg/dL 39  18  50   Creatinine 0.44 - 1.00 mg/dL 3.38  6.62  2.76   Sodium 135 - 145 mmol/L 130  134  135   Potassium 3.5 - 5.1 mmol/L 4.5  4.0  4.7   Chloride 98 - 111 mmol/L 93  93  93   CO2 22 - 32 mmol/L 21  26  25    Calcium  8.9 - 10.3 mg/dL 9.0  9.1  9.0        Latest Ref Rng & Units 09/16/2024    3:13 AM 09/15/2024    7:02 AM 09/14/2024    5:46 AM  CBC  WBC 4.0 - 10.5 K/uL 7.3  7.2  6.0   Hemoglobin 12.0 - 15.0 g/dL 9.7  89.5  89.7   Hematocrit 36.0 - 46.0 % 28.6  31.1  31.3   Platelets 150 - 400 K/uL 177  193  192     Lab Results  Component Value Date   CHOL 179 09/15/2024   HDL 36 (L) 09/15/2024   LDLCALC 104 (H) 09/15/2024   TRIG 200 (H) 09/15/2024   CHOLHDL 5.0 09/15/2024    Tele/EKG/Cardiac studies    Telemetry: Normal sinus rhythm, occasional PACs 09/16/2024  Echocardiogram 09/15/2024:   1. Left ventricular ejection fraction, by estimation, is 60 to 65%. The left ventricle has normal function. The left ventricle has no regional wall motion abnormalities. The left ventricular internal cavity size was mildly dilated. There is mild left ventricular hypertrophy. Left ventricular diastolic parameters are consistent with Grade I diastolic dysfunction (impaired relaxation).  2. Right ventricular systolic function is mildly reduced. The right ventricular size is moderately enlarged. Mildly increased right ventricular wall thickness.  3. Left atrial size was mild to moderately dilated.  4. Right atrial size was mildly dilated.  5. The mitral valve is grossly normal. Mild mitral valve regurgitation.  6. The aortic valve is calcified. Aortic valve regurgitation is moderate.  CARDIAC CATHETERIZATION 09/14/2024 Left main  appears to have ostial 20 to 30% stenosis and distal 20 to 30% stenosis.   Diffuse disease in the LAD and a very large D1 with diffuse disease and is subtotally occluded from mid to distal segment and filled by collaterals AV groove circumflex diffusely diseased and again diabetic looking vessel.   Focal stenosis in the proximal RCA, very large PL branch supplying a large lateral wall.          Radiology   Current Meds:    Current Medications[1]  Assessment & Plan .     1.  NSTEMI 2.  Multivessel coronary artery disease involving severely diseased very large diagonal that is occluded, diffusely diseased and subtotaled CX, high-grade stenosis there is focal untreatable in the proximal RCA.  Do not suspect high-grade stenosis in the left main, distal left main has 30% stenosis and ostial left main probably 30% stenosis.  Although there was dampening of pressure with catheter engagement, 3.  Hypercholesterolemia 4.  End-stage renal disease on hemodialysis  Recommendations: I have reviewed the angiograms with the patient's family. Patient was not on medical therapy aggressively including statin therapy previously, suspect she has chronic coronary disease as well now presenting with NSTEMI.  Proceed with culprit PCI to the RCA although wall motion abnormality is not a anterolateral wall, images personally reviewed.  This is probably in the distribution of the diagonal occlusion that is chronic.  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  EF appears to be 45 to 50% with distal anterolateral hypokinesis, personally.  Presently on a statin therapy, blood pressure is soft to initiate GDP.  She had hemodialysis yesterday.  For questions or updates, please contact [facility] Please consult [email or web address] for contact info under        Signed,   Gordy Bergamo, MD, [facility] 09/16/2024, 6:57 AM [facility] [address] [place], [place]  [address] Phone: [phone]. Fax:  [phone]       [1]  Current Facility-Administered Medications:    0.9 %  sodium chloride  infusion, 250 mL, Intravenous, PRN, Patwardhan, Manish J, MD   acetaminophen  (TYLENOL ) tablet 650 mg, 650 mg, Oral, Q4H PRN, Garrick Leontine SAILOR,  PA-C, 650 mg at 09/15/24 0155   aspirin  EC tablet 81 mg, 81 mg, Oral, Daily, Garrick Leontine SAILOR, PA-C, 81 mg at 09/15/24 9083   atorvastatin  (LIPITOR ) tablet 80 mg, 80 mg, Oral, Daily, Garrick Leontine SAILOR, PA-C, 80 mg at 09/15/24 9083   carvedilol  (COREG ) tablet 3.125 mg, 3.125 mg, Oral, BID WC, Garrick Leontine N, PA-C, 3.125 mg at 09/15/24 1550   Chlorhexidine  Gluconate Cloth 2 % PADS 6 each, 6 each, Topical, Q0600, Geralynn Charleston, MD, 6 each at 09/16/24 9375   heparin  ADULT infusion 100 units/mL (25000 units/250mL), 950 Units/hr, Intravenous, Continuous, Schuman, Christanna R, MD, Last Rate: 9.5 mL/hr at 09/15/24 2340, 950 Units/hr at 09/15/24 2340   melatonin tablet 3 mg, 3 mg, Oral, QHS PRN, Ingram, Damarcus A, MD, 3 mg at 09/14/24 2359   NIFEdipine  (PROCARDIA -XL/NIFEDICAL-XL) 24 hr tablet 30 mg, 30 mg, Oral, Daily, Garrick Leontine N, PA-C, 30 mg at 09/15/24 9083   nitroGLYCERIN  (NITROSTAT ) SL tablet 0.4 mg, 0.4 mg, Sublingual, Q5 Min x 3 PRN, Garrick, Logan N, PA-C   ondansetron  (ZOFRAN ) injection 4 mg, 4 mg, Intravenous, Q6H PRN, Garrick Leontine SAILOR, PA-C   pantoprazole  (PROTONIX ) EC tablet 20 mg, 20 mg, Oral, Daily, Garrick Leontine N, PA-C, 20 mg at 09/15/24 0916   sodium chloride  flush (NS) 0.9 % injection 3 mL, 3 mL, Intravenous, Q12H, Patwardhan, Manish J, MD   sodium chloride  flush (NS) 0.9 % injection 3 mL, 3 mL, Intravenous, PRN, Patwardhan, Manish J, MD   ticagrelor  (BRILINTA ) tablet 90 mg, 90 mg, Oral, BID, Henry Shaver B, NP, 90 mg at 09/16/24 [phone]

## 2024-09-17 ENCOUNTER — Other Ambulatory Visit (HOSPITAL_COMMUNITY): Payer: Self-pay

## 2024-09-17 DIAGNOSIS — R410 Disorientation, unspecified: Secondary | ICD-10-CM

## 2024-09-17 DIAGNOSIS — Z9861 Coronary angioplasty status: Secondary | ICD-10-CM

## 2024-09-17 LAB — CBC
HCT: 30.6 % — ABNORMAL LOW (ref 36.0–46.0)
Hemoglobin: 10.2 g/dL — ABNORMAL LOW (ref 12.0–15.0)
MCH: 30.7 pg (ref 26.0–34.0)
MCHC: 33.3 g/dL (ref 30.0–36.0)
MCV: 92.2 fL (ref 80.0–100.0)
Platelets: 235 K/uL (ref 150–400)
RBC: 3.32 MIL/uL — ABNORMAL LOW (ref 3.87–5.11)
RDW: 13.3 % (ref 11.5–15.5)
WBC: 8.9 K/uL (ref 4.0–10.5)
nRBC: 0 % (ref 0.0–0.2)

## 2024-09-17 LAB — POCT ACTIVATED CLOTTING TIME: Activated Clotting Time: 168 s

## 2024-09-17 LAB — BASIC METABOLIC PANEL WITH GFR
Anion gap: 15 (ref 5–15)
BUN: 31 mg/dL — ABNORMAL HIGH (ref 8–23)
CO2: 24 mmol/L (ref 22–32)
Calcium: 9.2 mg/dL (ref 8.9–10.3)
Chloride: 93 mmol/L — ABNORMAL LOW (ref 98–111)
Creatinine, Ser: 6.45 mg/dL — ABNORMAL HIGH (ref 0.44–1.00)
GFR, Estimated: 6 mL/min — ABNORMAL LOW
Glucose, Bld: 91 mg/dL (ref 70–99)
Potassium: 4.7 mmol/L (ref 3.5–5.1)
Sodium: 132 mmol/L — ABNORMAL LOW (ref 135–145)

## 2024-09-17 MED ORDER — DICYCLOMINE HCL 10 MG PO CAPS
10.0000 mg | ORAL_CAPSULE | Freq: Once | ORAL | Status: AC
  Administered 2024-09-17: 10 mg via ORAL
  Filled 2024-09-17: qty 1

## 2024-09-17 MED ORDER — PHENYLEPHRINE-MINERAL OIL-PET 0.25-14-74.9 % RE OINT
1.0000 | TOPICAL_OINTMENT | Freq: Two times a day (BID) | RECTAL | Status: DC | PRN
  Administered 2024-09-17: 1 via RECTAL
  Filled 2024-09-17: qty 57

## 2024-09-17 MED ORDER — WITCH HAZEL-GLYCERIN EX PADS
MEDICATED_PAD | CUTANEOUS | Status: DC | PRN
  Filled 2024-09-17: qty 100

## 2024-09-17 MED FILL — Nitroglycerin IV Soln 100 MCG/ML in D5W: INTRA_ARTERIAL | Qty: 10 | Status: AC

## 2024-09-17 MED FILL — Verapamil HCl IV Soln 2.5 MG/ML: INTRAVENOUS | Qty: 2 | Status: AC

## 2024-09-17 NOTE — Progress Notes (Signed)
 "  Rounding Note   Patient Name: Leah Davies Date of Encounter: 09/17/2024  [facility] Cardiologist: Marolyn Dooms, MD  Subjective No chest pain, no headache, no visual disturbances, moves all 4 extremities.  Patient's daughter is the translator as patient does not speak English.  Scheduled Meds:  aspirin  EC  81 mg Oral Daily   atorvastatin   80 mg Oral Daily   free water   250 mL Oral Once   metoprolol  succinate  25 mg Oral Daily   pantoprazole   20 mg Oral Daily   sodium chloride  flush  3 mL Intravenous Q12H   ticagrelor   90 mg Oral BID   Continuous Infusions:   PRN Meds: acetaminophen , melatonin, nitroGLYCERIN , ondansetron  (ZOFRAN ) IV, sodium chloride  flush   Vital Signs  Vitals:   09/16/24 2129 09/17/24 0100 09/17/24 0632 09/17/24 0820  BP: (!) 147/68 (!) 134/49 (!) 147/52 126/68  Pulse: 91  66 70  Resp: 13   14  Temp:  99.5 F (37.5 C) 98.9 F (37.2 C)   TempSrc:  Axillary Axillary   SpO2: 96%  97% 94%  Weight:      Height:        Intake/Output Summary (Last 24 hours) at 09/17/2024 1547 Last data filed at 09/16/2024 2130 Gross per 24 hour  Intake 120 ml  Output --  Net 120 ml      09/16/2024    4:18 AM 09/15/2024    4:25 AM 09/14/2024    7:22 PM  Last 3 Weights  Weight (lbs) 141 lb 1.5 oz 139 lb 8.8 oz 135 lb 5.8 oz  Weight (kg) 64 kg 63.3 kg 61.4 kg      Telemetry Normal sinus rhythm.- Personally Reviewed  ECG  EKG 09/17/2024: Normal sinus rhythm at rate of 68 bpm, left anterior fascicular block.  Right bundle branch block.  Cannot exclude posterior infarct old with early R progression.  Minimal ST elevation V2 and V3 and V4 suggests septal injury pattern versus posterior ischemia.  Compared to 09/16/2024, no significant change. Compared to baseline EKG on 12/16 and 09/15/2024, ST segment elevation in the anterior leads has much improved. - Personally Reviewed  Physical Exam Neck:     Vascular: No JVD.  Cardiovascular:     Rate and  Rhythm: Normal rate and regular rhythm.     Pulses: Intact distal pulses.     Heart sounds: S1 normal and S2 normal. Murmur heard.     Early systolic murmur is present with a grade of 2/6 at the upper right sternal border.     No gallop.     Comments: Left arm AV fistula present. Pulmonary:     Effort: Pulmonary effort is normal.     Breath sounds: Normal breath sounds.  Abdominal:     General: Bowel sounds are normal.     Palpations: Abdomen is soft.  Musculoskeletal:     Right lower leg: No edema.     Left lower leg: No edema.      Labs High Sensitivity Troponin:  No results for input(s): TROPONINIHS in the last 720 hours.  Recent Labs  Lab 09/11/24 2130 09/11/24 2331 09/12/24 0240 09/12/24 0530 09/12/24 1032  TRNPT 63* 318* 613* 754* 1,315*       Chemistry Recent Labs  Lab 09/11/24 2130 09/12/24 0240 09/13/24 1613 09/15/24 0702 09/16/24 0627 09/17/24 0833  NA 134*   < > 134* 130* 132* 132*  K 7.1*   < > 4.0 4.5 4.0 4.7  CL 97*   < > 93* 93* 93* 93*  CO2 15*   < > 26 21* 28 24  GLUCOSE 105*   < > 76 79 94 91  BUN 110*   < > 18 39* 22 31*  CREATININE 11.30*   < > 3.37* 6.61* 4.45* 6.45*  CALCIUM  9.6   < > 9.1 9.0 8.7* 9.2  PROT 7.9  --   --   --   --   --   ALBUMIN 4.4  --  4.1  --   --   --   AST 16  --   --   --   --   --   ALT 10  --   --   --   --   --   ALKPHOS 72  --   --   --   --   --   BILITOT 0.3  --   --   --   --   --   GFRNONAA 3*   < > 13* 6* 9* 6*  ANIONGAP 23*   < > 15 16* 11 15   < > = values in this interval not displayed.    Lipids  Recent Labs  Lab 09/15/24 0702  CHOL 179  TRIG 200*  HDL 36*  LDLCALC 104*  CHOLHDL 5.0    Hematology Recent Labs  Lab 09/15/24 0702 09/16/24 0313 09/17/24 0833  WBC 7.2 7.3 8.9  RBC 3.35* 3.15* 3.32*  HGB 10.4* 9.7* 10.2*  HCT 31.1* 28.6* 30.6*  MCV 92.8 90.8 92.2  MCH 31.0 30.8 30.7  MCHC 33.4 33.9 33.3  RDW 13.0 13.0 13.3  PLT 193 177 235    Cardiac Studies  Echocardiogram  09/15/2024:   1. Left ventricular ejection fraction, by estimation, is 60 to 65%. The left ventricle has normal function. The left ventricle has no regional wall motion abnormalities. The left ventricular internal cavity size was mildly dilated. There is mild left ventricular hypertrophy. Left ventricular diastolic parameters are consistent with Grade I diastolic dysfunction (impaired relaxation).  2. Right ventricular systolic function is mildly reduced. The right ventricular size is moderately enlarged. Mildly increased right ventricular wall thickness.  3. Left atrial size was mild to moderately dilated.  4. Right atrial size was mildly dilated.  5. The mitral valve is grossly normal. Mild mitral valve regurgitation.  6. The aortic valve is calcified. Aortic valve regurgitation is moderate.   CARDIAC CATHETERIZATION 09/14/2024 Left main appears to have ostial 20 to 30% stenosis and distal 20 to 30% stenosis.   Diffuse disease in the LAD and a very large D1 with diffuse disease and is subtotally occluded from mid to distal segment and filled by collaterals AV groove circumflex diffusely diseased and again diabetic looking vessel.   Focal stenosis in the proximal RCA, very large PL branch supplying a large lateral wall.  Coronary Stent Intervention 09/16/2024: (Please see detailed coronary angiogram 09/14/2024, except that ostial and distal LM stenosis is 20-30%, not 75%) RCA: Ostial 50%, proxmial 90% stenoses            Successful percutaneous coronary intervention prox RCA       Intravascular ultrasound (IVUS)       PTCA and stent placement 3.5 X 24 mm Synergy drug-eluting stent, deployed at 16 atm       Post dilatation using 3/75 X 12 mm [place] balloon up to 20 atm       0% residual stenosis at stented segment,  TIMI III flow       Prox RCA post PCI MSA 8.2 mm2       Ostial RCA (unstented) MLA 6.1 mm2  Contrast used: 55 cc Accidental air entry in the coronary without any slow flow No  neurodeficit Headache was present before accidental air entry and unlikely to related Continue to monitor the patient overnight in telemetry bed                Patient Profile   83 y.o. 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  She ruled in for NSTEMI.  LHC showed severe multivessel disease including 75% stenosis of ostial to mid left main.  She was transferred to [facility] for consideration of CABG,  after review, felt that she had high-grade culprit RCA disease that needs to be angioplastied and in view of her medical comorbidity, advanced age, end-stage renal disease on hemodialysis, patient not wanting blood transfusions in view of religious believes, it was felt that best option would be to proceed with target lesion revascularization only.   She underwent cardiac catheterization yesterday and angioplasty and stenting to the right coronary artery and unfortunately was complicated by air embolism.  Last night patient had confusional state as per family, this morning she was still confused but as day progressed, she is now completely oriented and alert and walked with the help of physical therapy without any neurologic deficits or speech deficits.  She also had her breakfast.  Patient's daughter is present at the bedside.  Assessment & Plan   NSTEMI CAD High-sensitivity troponin 63 >> 318 >> 754 >> 1,354.  Echo showed LVEF of 65% with normal wall motion, LVH, and grade 1 diastolic dysfunction, as well as mildly reduced RV function, MR, and moderate AI.  LHC at [facility] showed severe multivessel disease including 75% stenosis of ostial to mid left main.  She was transferred to [facility] for consideration of CABG it was not felt to be a good candidate for this.   Therefore, plan was for PCI.  She underwent repeat cardiac catheterization on 09/16/2024 showed 30% stenosis of left main, 50% stenosis of ostial RCA followed by 90% stenosis of proximal RCA, and 80% stenosis of proximal LCx.  She underwent PCI with DES to the RCA lesion. - No recurrent chest pain.  - Continue DAPT with Aspirin  81mg  daily and Brilinta  90mg  twice daily.  - Continue Lipitor  80mg  daily.   Hypertension BP labile this admission.  - Continue Toprol -XL 25mg  daily. - Home Losartan  and Nifedipine  currently on hold.  Hyperlipidemia Lipid panel this admission: Total Cholesterol 179, Triglycerides 200, HDL 36, LDL 104. LDL goal <55.  - She was started on Lipitor  80mg  daily this admission.  - Will need repeat lipid panel and LFTs this admission.  ESRD  On dialysis M/W/F. - Scheduled for dialysis today.  - Management per Nephrology.   Chronic Anemia Hemoglobin stable.  - Continue DAPT.   Altered mental status Patient's altered mental status is completely resolved.  I discussed with the family, her daughter at the bedside that she could have had either air embolization and small CVA or she could I had embolic complication from cardiac catheterization in view of her multiple medical comorbidity that was previously alluded to prior to cardiac catheterization.  We could certainly do MRI but patient's daughter states that patient is claustrophobic and she would be scared to be going through MRI.  We also discussed regarding CT scan.  As patient has no neurologic deficits, she is presently doing well, plan is to continue with physical therapy and consider outpatient physical therapy if needed and watchful waiting and if stable after dialysis today, discharged home in the morning.  All questions were answered.  Patient's family is aware of embolic complication that happened in the cardiac catheterization lab.  Fortunately no EKG abnormalities, has had excellent angiographic PCI results and no  obvious neurologic deficits.   Medical Readiness Date: 09/18/2024    For questions or updates, please contact [facility] Please consult [email or web address] for contact info under        Gordy Bergamo, MD, [facility] 09/17/2024, 3:47 PM [facility] [address] [place], [place] [address] Phone: [phone]. Fax:  [phone]  "

## 2024-09-17 NOTE — Progress Notes (Signed)
 Patient was informed of the ordered laboratory tests. Importance of the laboratory test and possible risks of refusing labs were explained and patient verbalizes understanding. Patient continues to refuse blood draw at this time. Provider notified.

## 2024-09-17 NOTE — Care Management Important Message (Signed)
 Important Message  Patient Details  Name: Leah Davies MRN: [id number] Date of Birth: [birth year]   Important Message Given:  Yes - Medicare IM     Vonzell Arrie Sharps 09/17/2024, 11:33 AM

## 2024-09-17 NOTE — Progress Notes (Signed)
 CARDIAC REHAB PHASE I   PRE:  Rate/Rhythm: 65 SR    BP: sitting 113/47    SpO2: 95 RA  MODE:  Ambulation: 270 ft   POST:  Rate/Rhythm: 75 SR    BP: sitting 134/46     SpO2: 97 RA   Pt feeling better today, family sts she is mostly clear cognition now. Pt able to slowly move to EOB and walk hall with contact guard. No major c/o. Tolerated well.  Discussed with pt through daughter interpreting MI, stent, Brilinta  importance, exercise, and CRPII. Her daughter cares for her and they both were receptive. Son also present.  Will refer to [facility] CRPII. Will bring educational materials in Spanish.  [id number]  Aliene Aris BS, ACSM-CEP 09/17/2024 11:12 AM

## 2024-09-17 NOTE — Progress Notes (Signed)
 " [facility] Progress Note   Subjective:   Seen in room - very sleepy. Family at bedside, they report she was up most of the night. S/p RCA stent yesterday - successful.   Objective Vitals:   09/16/24 2129 09/17/24 0100 09/17/24 0632 09/17/24 0820  BP: (!) 147/68 (!) 134/49 (!) 147/52 126/68  Pulse: 91  66 70  Resp: 13   14  Temp:  99.5 F (37.5 C) 98.9 F (37.2 C)   TempSrc:  Axillary Axillary   SpO2: 96%  97% 94%  Weight:      Height:       Physical Exam General: Sleepy, NAD. Room air Heart: RRR Lungs: CTA anteriorly Abdomen: soft Extremities: no LE edema Dialysis Access:   Additional Objective Labs: Basic Metabolic Panel: Recent Labs  Lab 09/13/24 1613 09/15/24 0702 09/16/24 0627 09/17/24 0833  NA 134* 130* 132* 132*  K 4.0 4.5 4.0 4.7  CL 93* 93* 93* 93*  CO2 26 21* 28 24  GLUCOSE 76 79 94 91  BUN 18 39* 22 31*  CREATININE 3.37* 6.61* 4.45* 6.45*  CALCIUM  9.1 9.0 8.7* 9.2  PHOS 3.5  --   --   --    Liver Function Tests: Recent Labs  Lab 09/11/24 2130 09/13/24 1613  AST 16  --   ALT 10  --   ALKPHOS 72  --   BILITOT 0.3  --   PROT 7.9  --   ALBUMIN 4.4 4.1   CBC: Recent Labs  Lab 09/11/24 2130 09/13/24 0627 09/14/24 0546 09/15/24 0702 09/16/24 0313 09/17/24 0833  WBC 6.5 5.9 6.0 7.2 7.3 8.9  NEUTROABS 4.8  --   --   --   --   --   HGB 10.9* 10.4* 10.2* 10.4* 9.7* 10.2*  HCT 33.7* 32.2* 31.3* 31.1* 28.6* 30.6*  MCV 94.4 93.6 92.6 92.8 90.8 92.2  PLT 178 202 192 193 177 235   Studies/Results: CARDIAC CATHETERIZATION Addendum Date: 09/16/2024 Coronary intervention 09/16/2024: (Please see detailed coronary angiogram 09/14/2024, except that ostial and distal LM stenosis is 20-30%, not 75%) RCA: Ostial 50%, proxmial 90% stenoses           Successful percutaneous coronary intervention prox RCA       Intravascular ultrasound (IVUS)       PTCA and stent placement 3.5 X 24 mm Synergy drug-eluting stent, deployed at 16 atm        Post dilatation using 3/75 X 12 mm [place] balloon up to 20 atm       0% residual stenosis at stented segment, TIMI III flow       Prox RCA post PCI MSA 8.2 mm2       Ostial RCA (unstented) MLA 6.1 mm2 Contrast used: 55 cc Accidental air entry in the coronary without any slow flow No neurodeficit Headache was present before accidental air entry and unlikely to related Continue to monitor the patient overnight in telemetry bed [name], MD   Result Date: 09/16/2024 Images from the original result were not included. Coronary intervention 09/16/2024: (Please see detailed coronary angiogram 09/14/2024, except that ostial and distal LM stenosis is 20-30%, not 75%) RCA: Ostial 50%, proxmial 90% stenoses           Successful percutaneous coronary intervention prox RCA       Intravascular ultrasound (IVUS)       PTCA and stent placement 3.5 X 24 mm Synergy drug-eluting stent, deployed at 16 atm  Post dilatation using 3/75 X 12 mm [place] balloon up to 20 atm       0% residual stenosis at stented segment, TIMI III flow       Prox RCA post PCI MSA 8.2 mm2       Ostial RCA (unstented) MLA 6.1 mm2 Contrast used: 55 cc Accidental air entry in the coronary without any slow flow No neurodeficit Headache was present before accidental air entry and unlikely to related Continue to monitor the patient overnight in telemetry bed Manish JINNY Lawrence, MD  Medications:  sodium chloride       aspirin  EC  81 mg Oral Daily   atorvastatin   80 mg Oral Daily   free water   250 mL Oral Once   metoprolol  succinate  25 mg Oral Daily   pantoprazole   20 mg Oral Daily   sodium chloride  flush  3 mL Intravenous Q12H   ticagrelor   90 mg Oral BID   Dialysis Orders MWF - [facility] - Unable to get through to outpt HD unit for orders  Assessment/Plan: NSTEMI/CAD: Now s/p PCI/DES to RCA on 12/18, felt that was culprit lesion. Still with L main stenosis. Will need DAPT for 1 year, further plan per  cardiology. ESRD: Will continue MWF schedule - for HD today. HTN/volume: BP stable, no edema on exam. Anemia of ESRD: Hgb 10.2 - follow without ESA for now. Secondary HPTH: Ca/Phos ok - does not appear on binders.  FYI -- Holiday Schedule for next week Usual MWF schedule -> Sun (21), Tues (23), Friday (26) Usual TTS schedule -> Mon (22), Wed (24), Sat (27)   Katie Demarius Archila, PA-C 09/17/2024, 1:24 PM  [facility]    "

## 2024-09-17 NOTE — Plan of Care (Signed)

## 2024-09-17 NOTE — Plan of Care (Signed)
" °  Problem: Clinical Measurements: Goal: Will remain free from infection Outcome: Progressing Goal: Respiratory complications will improve Outcome: Progressing Goal: Cardiovascular complication will be avoided Outcome: Progressing   Problem: Elimination: Goal: Will not experience complications related to urinary retention Outcome: Progressing   Problem: Pain Managment: Goal: General experience of comfort will improve and/or be controlled Outcome: Progressing   Problem: Safety: Goal: Ability to remain free from injury will improve Outcome: Progressing   Problem: Activity: Goal: Ability to tolerate increased activity will improve Outcome: Progressing   Problem: Cardiac: Goal: Ability to achieve and maintain adequate cardiovascular perfusion will improve Outcome: Progressing   Problem: Activity: Goal: Ability to return to baseline activity level will improve Outcome: Progressing   Problem: Cardiovascular: Goal: Ability to achieve and maintain adequate cardiovascular perfusion will improve Outcome: Progressing Goal: Vascular access site(s) Level 0-1 will be maintained Outcome: Progressing   "

## 2024-09-18 ENCOUNTER — Other Ambulatory Visit (HOSPITAL_COMMUNITY): Payer: Self-pay

## 2024-09-18 LAB — CBC
HCT: 28.9 % — ABNORMAL LOW (ref 36.0–46.0)
Hemoglobin: 9.7 g/dL — ABNORMAL LOW (ref 12.0–15.0)
MCH: 30.9 pg (ref 26.0–34.0)
MCHC: 33.6 g/dL (ref 30.0–36.0)
MCV: 92 fL (ref 80.0–100.0)
Platelets: 207 K/uL (ref 150–400)
RBC: 3.14 MIL/uL — ABNORMAL LOW (ref 3.87–5.11)
RDW: 13.4 % (ref 11.5–15.5)
WBC: 8.1 K/uL (ref 4.0–10.5)
nRBC: 0 % (ref 0.0–0.2)

## 2024-09-18 MED ORDER — TICAGRELOR 90 MG PO TABS
90.0000 mg | ORAL_TABLET | Freq: Two times a day (BID) | ORAL | 11 refills | Status: AC
  Filled 2024-09-18: qty 60, 30d supply, fill #0

## 2024-09-18 MED ORDER — NITROGLYCERIN 0.4 MG SL SUBL
0.4000 mg | SUBLINGUAL_TABLET | SUBLINGUAL | 12 refills | Status: AC | PRN
  Filled 2024-09-18: qty 25, 9d supply, fill #0

## 2024-09-18 MED ORDER — ATORVASTATIN CALCIUM 80 MG PO TABS
80.0000 mg | ORAL_TABLET | Freq: Every day | ORAL | 6 refills | Status: AC
  Filled 2024-09-18: qty 30, 30d supply, fill #0

## 2024-09-18 MED ORDER — ASPIRIN 81 MG PO TBEC
81.0000 mg | DELAYED_RELEASE_TABLET | Freq: Every day | ORAL | 12 refills | Status: AC
  Filled 2024-09-18: qty 30, 30d supply, fill #0

## 2024-09-18 NOTE — Progress Notes (Signed)
 RN took report for this PT from the primary RN. Per patient she wants to be done in the Morning. Explain to the PT that there is no guarantee that she will be done in the morning tomorrow base on tomorrow's census. Pt still insisted to be done tomorrow morning.

## 2024-09-18 NOTE — Plan of Care (Signed)
" °  Problem: Health Behavior/Discharge Planning: Goal: Ability to manage health-related needs will improve Outcome: Progressing   Problem: Clinical Measurements: Goal: Will remain free from infection Outcome: Progressing Goal: Respiratory complications will improve Outcome: Progressing Goal: Cardiovascular complication will be avoided Outcome: Progressing   Problem: Activity: Goal: Risk for activity intolerance will decrease Outcome: Progressing   Problem: Pain Managment: Goal: General experience of comfort will improve and/or be controlled Outcome: Progressing   Problem: Safety: Goal: Ability to remain free from injury will improve Outcome: Progressing   Problem: Activity: Goal: Ability to tolerate increased activity will improve Outcome: Progressing   Problem: Cardiac: Goal: Ability to achieve and maintain adequate cardiovascular perfusion will improve Outcome: Progressing   "

## 2024-09-18 NOTE — Discharge Summary (Signed)
 " Discharge Summary   Patient ID: Leah Davies MRN: [id number]; DOB: 1941/03/20  Admit date: 09/14/2024 Discharge date: 09/18/2024  PCP:  Sherial Bail, MD   [facility] Providers Cardiologist:  Marsa Dooms, MD   [facility]  Discharge Diagnoses  Principal Problem:   NSTEMI (non-ST elevated myocardial infarction) [facility]) Active Problems:   Primary hypertension   ESRD (end stage renal disease) (HCC)   Diagnostic Studies/Procedures   CORONARY STENT INTERVENTION  Coronary intervention 09/16/2024: (Please see detailed coronary angiogram 09/14/2024, except that ostial and distal LM stenosis is 20-30%, not 75%) RCA: Ostial 50%, proxmial 90% stenoses            Successful percutaneous coronary intervention prox RCA       Intravascular ultrasound (IVUS)       PTCA and stent placement 3.5 X 24 mm Synergy drug-eluting stent, deployed at 16 atm       Post dilatation using 3/75 X 12 mm [place] balloon up to 20 atm       0% residual stenosis at stented segment, TIMI III flow       Prox RCA post PCI MSA 8.2 mm2       Ostial RCA (unstented) MLA 6.1 mm2   CARDIAC CATH: 09/14/2024 at [facility] Diagnostic Dominance: Right   ECHO at [facility]: 09/13/2024  1. Left ventricular ejection fraction, by estimation, is 60 to 65%. The  left ventricle has normal function. The left ventricle has no regional  wall motion abnormalities. The left ventricular internal cavity size was  mildly dilated. There is mild left  ventricular hypertrophy. Left ventricular diastolic parameters are  consistent with Grade I diastolic dysfunction (impaired relaxation).   2. Right ventricular systolic function is mildly reduced. The right  ventricular size is moderately enlarged. Mildly increased right  ventricular wall thickness.   3. Left atrial size was mild to moderately dilated.   4. Right atrial size was mildly dilated.   5. The mitral valve is grossly normal. Mild mitral valve regurgitation.   6.  The aortic valve is calcified. Aortic valve regurgitation is moderate.   _____________   History of Present Illness   Leah Davies is a 83 y.o. female with ESRD-HD (MWF), HTN, HLD, gout, GERD, anemia, who presented to [facility] on 12/13 with chest pain, weakness and HA.   She had just been to [place] to visit family and had missed dialysis for an entire week.  She had severe hyperkalemia on admission with a potassium of 7.1.  She was seen at [facility] by Cardiology.  Her troponin peaked at 1315, cardiac catheterization was performed on 12/16, results are above.  Her EF appeared to be 25-35% cath, but was 60% by echo.  She was transferred to [facility] for CABG evaluation.   Hospital Course   Consultants: Nephrology  Dr. Ladona reviewed the cath films and discussed the situation with the patient and her family with interpretation.  Left main does not appear to be critically stenosed, good reflux of dye evident, patient also not on any medical therapy from cardiac standpoint. Patient does not want to have blood transfusions due to her religious beliefs. She would be a very poor candidate for CABG as well in view of her markedly sedentary lifestyle, advanced age, diffusely diseased vessels and left main does not appear critical.   The best option was felt to be PCI to the RCA.  She was loaded on Brilinta  and started on GDMT.  Dr. Dooms was in agreement.  Nephrology was consulted to manage her dialysis.  During the procedure there was accidental air entry in the contrast tubing.  This did not cause damage to the flow into the heart artery and she tolerated it well.  This information was printed out and given to the family.  On 12/19, she was refusing blood draws.  She was seen by cardiac rehab.  She ambulated without chest pain or shortness of breath.  She was referred to [facility] outpatient cardiac rehab and given educational materials in Spanish.  The plan was for her to have HD on 12/19.   However, they did not get her started until just after midnight.  At that time, she refused and stated she wished to wait to be done in the morning.  On 12/20, she was seen by Nephrology.  She still did not wish to go to dialysis and they felt that enough fluid had been removed, that she was stable to wait until Monday and keep her regular dialysis appointment in [place].  She was seen by Dr. Almetta and all data were reviewed.  TOC pharmacy filled the aspirin , Brilinta , nitroglycerin , and Lipitor .  She had already been started on ARB and beta-blocker at [facility].  Since it was felt that she could wait until Monday to get dialysis, no further inpatient workup was needed.  She is considered stable for discharge, to follow-up at the [facility].     Did the patient have an acute coronary syndrome (MI, NSTEMI, STEMI, etc) this admission?:  Yes                               AHA/ACC ACS Clinical Performance & Quality Measures: Aspirin  prescribed? - Yes ADP Receptor Inhibitor (Plavix/Clopidogrel, Brilinta /Ticagrelor  or Effient/Prasugrel) prescribed (includes medically managed patients)? - Yes Beta Blocker prescribed? - Yes High Intensity Statin (Lipitor  40-80mg  or Crestor 20-40mg ) prescribed? - Yes EF assessed during THIS hospitalization? - Yes For EF <40%, was ACEI/ARB prescribed? - Not Applicable (EF >/= 40%) For EF <40%, Aldosterone Antagonist (Spironolactone or Eplerenone) prescribed? - Not Applicable (EF >/= 40%) Cardiac Rehab Phase II ordered (including medically managed patients)? - Yes    _____________  Discharge Vitals Blood pressure (!) 112/39, pulse 66, temperature 97.9 F (36.6 C), temperature source Oral, resp. rate 17, height 4' 9 (1.448 m), weight 71.2 kg, SpO2 99%.  Filed Weights   09/15/24 0425 09/16/24 0418 09/18/24 0436  Weight: 63.3 kg 64 kg 71.2 kg    Labs & Radiologic Studies  CBC Recent Labs    09/17/24 0833 09/18/24 0423  WBC 8.9 8.1  HGB 10.2* 9.7*   HCT 30.6* 28.9*  MCV 92.2 92.0  PLT 235 207   Basic Metabolic Panel Recent Labs    87/81/74 0627 09/17/24 0833  NA 132* 132*  K 4.0 4.7  CL 93* 93*  CO2 28 24  GLUCOSE 94 91  BUN 22 31*  CREATININE 4.45* 6.45*  CALCIUM  8.7* 9.2   Liver Function Tests No results for input(s): AST, ALT, ALKPHOS, BILITOT, PROT, ALBUMIN in the last 72 hours. No results for input(s): LIPASE, AMYLASE in the last 72 hours. High Sensitivity Troponin:   No results for input(s): TROPONINIHS in the last 720 hours.  Recent Labs  Lab 09/11/24 2130 09/11/24 2331 09/12/24 0240 09/12/24 0530 09/12/24 1032  TRNPT 63* 318* 613* 754* 1,315*    BNP Invalid input(s): POCBNP No results for input(s): PROBNP in the last 72 hours.  No results for input(s): BNP in the last 72 hours.  D-Dimer No results for input(s): DDIMER in the last 72 hours. Hemoglobin A1C No results for input(s): HGBA1C in the last 72 hours. Fasting Lipid Panel No results for input(s): CHOL, HDL, LDLCALC, TRIG, CHOLHDL, LDLDIRECT in the last 72 hours. Lipoprotein (a)  Date/Time Value Ref Range Status  09/15/2024 07:02 AM 41.6 (H) <75.0 nmol/L Final    Comment:    (NOTE) This test was developed and its performance characteristics determined by [facility]. It has not been cleared or approved by the Food and Drug Administration. Note:  Values greater than or equal to 75.0 nmol/L may       indicate an independent risk factor for CHD,       but must be evaluated with caution when applied       to non-Caucasian populations due to the       influence of genetic factors on Lp(a) across       ethnicities. Performed At: [facility] [address] [place], [place] [id number] Jennette Shorter MD Ey:[phone]     Thyroid  Function Tests No results for input(s): TSH, T4TOTAL, T3FREE, THYROIDAB in the last 72 hours.  Invalid input(s): FREET3 _____________  CARDIAC  CATHETERIZATION Addendum Date: 09/16/2024 Coronary intervention 09/16/2024: (Please see detailed coronary angiogram 09/14/2024, except that ostial and distal LM stenosis is 20-30%, not 75%) RCA: Ostial 50%, proxmial 90% stenoses           Successful percutaneous coronary intervention prox RCA       Intravascular ultrasound (IVUS)       PTCA and stent placement 3.5 X 24 mm Synergy drug-eluting stent, deployed at 16 atm       Post dilatation using 3/75 X 12 mm [place] balloon up to 20 atm       0% residual stenosis at stented segment, TIMI III flow       Prox RCA post PCI MSA 8.2 mm2       Ostial RCA (unstented) MLA 6.1 mm2 Contrast used: 55 cc Accidental air entry in the coronary without any slow flow No neurodeficit Headache was present before accidental air entry and unlikely to related Continue to monitor the patient overnight in telemetry bed [name], MD   Result Date: 09/16/2024 Images from the original result were not included. Coronary intervention 09/16/2024: (Please see detailed coronary angiogram 09/14/2024, except that ostial and distal LM stenosis is 20-30%, not 75%) RCA: Ostial 50%, proxmial 90% stenoses           Successful percutaneous coronary intervention prox RCA       Intravascular ultrasound (IVUS)       PTCA and stent placement 3.5 X 24 mm Synergy drug-eluting stent, deployed at 16 atm       Post dilatation using 3/75 X 12 mm [place] balloon up to 20 atm       0% residual stenosis at stented segment, TIMI III flow       Prox RCA post PCI MSA 8.2 mm2       Ostial RCA (unstented) MLA 6.1 mm2 Contrast used: 55 cc Accidental air entry in the coronary without any slow flow No neurodeficit Headache was present before accidental air entry and unlikely to related Continue to monitor the patient overnight in telemetry bed [name], MD  ECHOCARDIOGRAM COMPLETE Result Date: 09/15/2024    ECHOCARDIOGRAM REPORT   Patient Name:   Leah Davies Date of Exam: 09/13/2024 Medical Rec #:  [id number]     Height:       57.0 in Accession #:    [id number]    Weight:       139.2 lb Date of Birth:  1941/07/14      BSA:          1.542 m Patient Age:    83 years      BP:           117/54 mmHg Patient Gender: F             HR:           67 bpm. Exam Location:  [facility] Procedure: 2D Echo, Cardiac Doppler, Color Doppler and Intracardiac            Opacification Agent (Both Spectral and Color Flow Doppler were            utilized during procedure). Indications:     Chest Pain R07.9  History:         Patient has prior history of Echocardiogram examinations, most                  recent 04/10/2021. Signs/Symptoms:Chest Pain.  Sonographer:     Ashley McNeely-Sloane Referring Phys:  [id number] ALEXANDER PARASCHOS Diagnosing Phys: Dwayne D Callwood MD IMPRESSIONS  1. Left ventricular ejection fraction, by estimation, is 60 to 65%. The left ventricle has normal function. The left ventricle has no regional wall motion abnormalities. The left ventricular internal cavity size was mildly dilated. There is mild left ventricular hypertrophy. Left ventricular diastolic parameters are consistent with Grade I diastolic dysfunction (impaired relaxation).  2. Right ventricular systolic function is mildly reduced. The right ventricular size is moderately enlarged. Mildly increased right ventricular wall thickness.  3. Left atrial size was mild to moderately dilated.  4. Right atrial size was mildly dilated.  5. The mitral valve is grossly normal. Mild mitral valve regurgitation.  6. The aortic valve is calcified. Aortic valve regurgitation is moderate. FINDINGS  Left Ventricle: Left ventricular ejection fraction, by estimation, is 60 to 65%. The left ventricle has normal function. The left ventricle has no regional wall motion abnormalities. Definity  contrast agent was given IV to delineate the left ventricular  endocardial borders. Strain was performed and the global longitudinal strain is indeterminate. The left ventricular internal  cavity size was mildly dilated. There is mild left ventricular hypertrophy. Left ventricular diastolic parameters are consistent  with Grade I diastolic dysfunction (impaired relaxation). Right Ventricle: The right ventricular size is moderately enlarged. Mildly increased right ventricular wall thickness. Right ventricular systolic function is mildly reduced. Left Atrium: Left atrial size was mild to moderately dilated. Right Atrium: Right atrial size was mildly dilated. Pericardium: Trivial pericardial effusion is present. Mitral Valve: The mitral valve is grossly normal. Mild mitral valve regurgitation. MV peak gradient, 4.3 mmHg. The mean mitral valve gradient is 2.0 mmHg. Tricuspid Valve: The tricuspid valve is grossly normal. Tricuspid valve regurgitation is mild. Aortic Valve: The aortic valve is calcified. Aortic valve regurgitation is moderate. Aortic valve mean gradient measures 4.0 mmHg. Aortic valve peak gradient measures 7.3 mmHg. Aortic valve area, by VTI measures 1.72 cm. Pulmonic Valve: The pulmonic valve was not well visualized. Pulmonic valve regurgitation is not visualized. Aorta: The ascending aorta was not well visualized. IAS/Shunts: No atrial level shunt detected by color flow Doppler. Additional Comments: 3D was performed not requiring image post processing on an independent workstation and was indeterminate.  LEFT VENTRICLE PLAX 2D LVIDd:  5.10 cm     Diastology LVIDs:         2.90 cm     LV e' medial:    3.32 cm/s LV PW:         1.20 cm     LV E/e' medial:  17.7 LV IVS:        0.90 cm     LV e' lateral:   4.14 cm/s LVOT diam:     1.80 cm     LV E/e' lateral: 14.2 LV SV:         55 LV SV Index:   35 LVOT Area:     2.54 cm LV IVRT:       123 msec  LV Volumes (MOD) LV vol d, MOD A2C: 78.6 ml LV vol d, MOD A4C: 69.8 ml LV vol s, MOD A2C: 23.7 ml LV vol s, MOD A4C: 33.3 ml LV SV MOD A2C:     54.9 ml LV SV MOD A4C:     69.8 ml LV SV MOD BP:      46.5 ml RIGHT VENTRICLE RV Basal diam:   3.60 cm    PULMONARY VEINS RV Mid diam:    2.90 cm    A Reversal Duration: 158.00 msec RV S prime:     7.18 cm/s  A Reversal Velocity: 24.20 cm/s TAPSE (M-mode): 1.7 cm     Diastolic Velocity:  18.90 cm/s                            S/D Velocity:        1.50                            Systolic Velocity:   28.40 cm/s LEFT ATRIUM             Index        RIGHT ATRIUM          Index LA diam:        4.00 cm 2.59 cm/m   RA Area:     7.23 cm LA Vol (A2C):   38.4 ml 24.90 ml/m  RA Volume:   11.30 ml 7.33 ml/m LA Vol (A4C):   54.6 ml 35.41 ml/m LA Biplane Vol: 46.2 ml 29.96 ml/m  AORTIC VALVE                    PULMONIC VALVE AV Area (Vmax):    1.90 cm     PV Vmax:        0.94 m/s AV Area (Vmean):   1.89 cm     PV Vmean:       67.800 cm/s AV Area (VTI):     1.72 cm     PV VTI:         0.221 m AV Vmax:           135.00 cm/s  PV Peak grad:   3.6 mmHg AV Vmean:          91.700 cm/s  PV Mean grad:   2.0 mmHg AV VTI:            0.318 m      RVOT Peak grad: 3 mmHg AV Peak Grad:      7.3 mmHg AV Mean Grad:      4.0 mmHg LVOT Vmax:         101.00 cm/s LVOT  Vmean:        68.100 cm/s LVOT VTI:          0.215 m LVOT/AV VTI ratio: 0.68  AORTA Ao Root diam: 2.30 cm Ao Asc diam:  2.40 cm MITRAL VALVE MV Area (PHT): 2.91 cm     SHUNTS MV Area VTI:   2.19 cm     Systemic VTI:  0.22 m MV Peak grad:  4.3 mmHg     Systemic Diam: 1.80 cm MV Mean grad:  2.0 mmHg     Pulmonic VTI:  0.198 m MV Vmax:       1.04 m/s MV Vmean:      63.4 cm/s MV Decel Time: 261 msec MV E velocity: 58.70 cm/s MV A velocity: 108.00 cm/s MV E/A ratio:  0.54 Dwayne JONETTA Lovelace MD Electronically signed by Cara JONETTA Lovelace MD Signature Date/Time: 09/15/2024/4:38:09 PM    Final    CARDIAC CATHETERIZATION Result Date: 09/14/2024   Ost LM to Mid LM lesion is 75% stenosed.   Ost Cx to Mid Cx lesion is 80% stenosed.   Prox RCA lesion is 90% stenosed.   There is moderate left ventricular systolic dysfunction.   The left ventricular ejection fraction is 25-35% by  visual estimate. 1.  NSTEMI 2.  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  Moderate to severely reduced left ventricular function, with estimated LV ejection fraction 30%, with anterior apical dyskinesis Recommendations 1.  Transfer to [facility] for evaluation for CABG 2.  Resume heparin  drip   US  Venous Img Lower Bilateral (DVT) Result Date: 09/12/2024 EXAM: ULTRASOUND DUPLEX OF THE BILATERAL LOWER EXTREMITY VEINS TECHNIQUE: Duplex ultrasound using B-mode/gray scaled imaging and Doppler spectral analysis and color flow was obtained of the deep venous structures of the bilateral lower extremity. COMPARISON: None available. CLINICAL HISTORY: 220372 Positive D dimer [id number] Positive D dimer FINDINGS: The common femoral veins, femoral veins, popliteal veins, and posterior tibial veins demonstrate normal compressibility with normal color flow and spectral analysis. IMPRESSION: 1. No evidence of DVT. Electronically signed by: Francis Quam MD 09/12/2024 06:50 AM EST RP Workstation: [id number]   CT Angio Chest Pulmonary Embolism (PE) W or WO Contrast Result Date: 09/12/2024 EXAM: CTA of the Chest with contrast for PE 09/12/2024 02:33:48 AM TECHNIQUE: CTA of the chest was performed after the administration of 75 mL iohexol  (OMNIPAQUE ) 350 MG/ML injection. Multiplanar reformatted images are provided for review. MIP images are provided for review. Automated exposure control, iterative reconstruction, and/or weight based adjustment of the mA/kV was utilized to reduce the radiation dose to as low as reasonably achievable. COMPARISON: Chest x-ray from the previous day. CLINICAL HISTORY: Chest pain FINDINGS: PULMONARY ARTERIES: No pulmonary embolism. Main pulmonary artery is normal in caliber. The pulmonary artery shows a normal branching pattern. MEDIASTINUM: The heart and pericardium demonstrate no acute abnormality. Coronary calcifications  are noted. Atherosclerotic calcifications of the thoracic aorta are noted without an aneurysmal dilatation. The degree of enhancement is limited with regards to evaluation for dissection. The thoracic inlet is within normal limits. The esophagus as visualized is within normal limits. LYMPH NODES: No mediastinal, hilar or axillary lymphadenopathy. LUNGS AND PLEURA: The lungs are well aerated bilaterally. No focal infiltrate or pulmonary edema. No pleural effusion or pneumothorax. A few scattered calcified granulomas are noted. UPPER ABDOMEN: The visualized upper abdomen shows no acute abnormality. SOFT TISSUES AND BONES: No acute bone or soft tissue abnormality. IMPRESSION: 1.  No pulmonary embolism. 2. Aortic atherosclerosis Electronically signed by: Oneil Devonshire MD 09/12/2024 02:48 AM EST RP Workstation: [id number]   DG Chest Portable 1 View Result Date: 09/11/2024 EXAM: 1 VIEW(S) XRAY OF THE CHEST 09/11/2024 10:18:58 PM COMPARISON: 04/10/2021 CLINICAL HISTORY: CP FINDINGS: LUNGS AND PLEURA: Mild patchy left lower lobe opacity, atelectasis versus pneumonia. Small left pleural effusion. No pneumothorax. HEART AND MEDIASTINUM: Thoracic atherosclerosis. No acute abnormality of the cardiac and mediastinal silhouettes. BONES AND SOFT TISSUES: No acute osseous abnormality. IMPRESSION: 1. Mild patchy left lower lobe opacity, atelectasis versus pneumonia. 2. Small left pleural effusion. Electronically signed by: Pinkie Pebbles MD 09/11/2024 10:25 PM EST RP Workstation: [id number]    Disposition Pt is being discharged home today in good condition.  Follow-up Plans & Appointments  Follow-up Information     Sherial Bail, MD Follow up.   Specialty: Internal Medicine Contact information: [address] [place] [address] [phone]         Ammon Blunt, MD Follow up.   Specialty: Cardiology Why: Please call to make an appointment, you need to be seen within 2 weeks. Contact  information: [address] [facility] [place] [address] [phone]                Discharge Instructions     Amb Referral to Cardiac Rehabilitation   Complete by: As directed    Diagnosis:  Coronary Stents PTCA NSTEMI     After initial evaluation and assessments completed: Virtual Based Care may be provided alone or in conjunction with Phase 2 Cardiac Rehab based on patient barriers.: Yes   Intensive Cardiac Rehabilitation (ICR) [facility] location only OR Traditional Cardiac Rehabilitation (TCR) *If criteria for ICR are not met will enroll in TCR [facility] only): Yes   Increase activity slowly   Complete by: As directed        Discharge Medications Allergies as of 09/18/2024       Reactions   Amlodipine  Rash, Other (See Comments), Hypertension   Caused increased blood pressure, kidney failure Caused increased blood pressure       Azithromycin  Other (See Comments)   Unknown   Prednisone Other (See Comments)   Pt states she feels like her BP dropped, got dizzy, and very pale.   Tizanidine Other (See Comments)   Dizziness, pt states BP dropped, and very pale.         Medication List     TAKE these medications    acetaminophen  500 MG tablet Commonly known as: TYLENOL  Take 1,000 mg by mouth daily.   aspirin  EC 81 MG tablet Take 1 tablet (81 mg total) by mouth daily.   atorvastatin  80 MG tablet Commonly known as: LIPITOR  Tome 1 tableta (80 mg en total) por va oral diariamente. (Take 1 tablet (80 mg total) by mouth daily.)   calcium  acetate 667 MG tablet Commonly known as: PHOSLO Take 667 mg by mouth 3 (three) times daily.   gabapentin 100 MG capsule Commonly known as: NEURONTIN Take 100 mg by mouth 2 (two) times daily as needed.   losartan  50 MG tablet Commonly known as: COZAAR  Take 50 mg by mouth in the morning and at bedtime.   meclizine  25 MG tablet Commonly known as: ANTIVERT  TAKE 1 TABLET BY MOUTH 3 TIMES DAILY AS  NEEDED FOR DIZZINESS OR NAUSEA.   metoprolol  succinate 25 MG 24 hr tablet Commonly known as: TOPROL -XL Take 1 tablet (25 mg total) by mouth daily.   multivitamin Tabs tablet Take  1 tablet by mouth daily.   NIFEdipine  30 MG 24 hr tablet Commonly known as: PROCARDIA -XL/NIFEDICAL-XL Take 30 mg by mouth at bedtime.   nitroGLYCERIN  0.4 MG SL tablet Commonly known as: NITROSTAT  Place 1 tablet (0.4 mg total) under the tongue every 5 (five) minutes x 3 doses as needed for chest pain.   omeprazole 20 MG capsule Commonly known as: PRILOSEC Take 20 mg by mouth daily.   ticagrelor  90 MG Tabs tablet Commonly known as: BRILINTA  Take 1 tablet (90 mg total) by mouth 2 (two) times daily.         Outstanding Labs/Studies None  Duration of Discharge Encounter: APP Time: 42 minutes   Signed, Shona Shad, PA-C 09/18/2024, 11:12 AM     "

## 2024-09-18 NOTE — Progress Notes (Signed)
 DISCHARGE NOTE HOME Leah Davies to be discharged Home per MD order. Discussed prescriptions and follow up appointments with the patient. Prescriptions given to patient; medication list explained in detail. Patient verbalized understanding.  Skin clean, dry and intact without evidence of skin break down, no evidence of skin tears noted. IV catheter discontinued intact. Site without signs and symptoms of complications. Dressing and pressure applied. Pt denies pain at the site currently. No complaints noted.  See LDA for fistula location at discharge Patient free of lines, drains, and wounds.   An After Visit Summary (AVS) was printed and given to the patient. Patient escorted via wheelchair, and discharged home via private auto.  Peyton SHAUNNA Pepper, RN

## 2024-09-18 NOTE — Progress Notes (Signed)
 Per NP [name], patient will go to clinic on Monday.  Will not run dialysis today.

## 2024-09-18 NOTE — Progress Notes (Signed)
 " [facility] Progress Note   Subjective:    Seen and examined patient at bedside. Patient's daughter also at bedside. Patient request she not do dialysis this morning and resume her treatment on Monday at her outpatient HD center. Current labs and volume are stable. From a renal standpoint, okay to discharge today and she can resume HD on Monday in outpatient.  Objective Vitals:   09/17/24 2142 09/17/24 2348 09/18/24 0436 09/18/24 0801  BP:  105/70 (!) 108/40 (!) 112/39  Pulse: 60 (!) 57 (!) 56 66  Resp: 16 16 16 17   Temp:  98.1 F (36.7 C) 97.9 F (36.6 C) 97.9 F (36.6 C)  TempSrc:  Oral Oral Oral  SpO2: 97% 97% 97% 99%  Weight:   71.2 kg   Height:       Physical Exam General: Sleepy, NAD. Room air Heart: RRR Lungs: CTA anteriorly Abdomen: soft Extremities: no LE edema Dialysis Access: AVF  Filed Weights   09/15/24 0425 09/16/24 0418 09/18/24 0436  Weight: 63.3 kg 64 kg 71.2 kg   No intake or output data in the 24 hours ending 09/18/24 1115  Additional Objective Labs: Basic Metabolic Panel: Recent Labs  Lab 09/13/24 1613 09/15/24 0702 09/16/24 0627 09/17/24 0833  NA 134* 130* 132* 132*  K 4.0 4.5 4.0 4.7  CL 93* 93* 93* 93*  CO2 26 21* 28 24  GLUCOSE 76 79 94 91  BUN 18 39* 22 31*  CREATININE 3.37* 6.61* 4.45* 6.45*  CALCIUM  9.1 9.0 8.7* 9.2  PHOS 3.5  --   --   --    Liver Function Tests: Recent Labs  Lab 09/11/24 2130 09/13/24 1613  AST 16  --   ALT 10  --   ALKPHOS 72  --   BILITOT 0.3  --   PROT 7.9  --   ALBUMIN 4.4 4.1   No results for input(s): LIPASE, AMYLASE in the last 168 hours. CBC: Recent Labs  Lab 09/11/24 2130 09/13/24 0627 09/14/24 0546 09/15/24 0702 09/16/24 0313 09/17/24 0833 09/18/24 0423  WBC 6.5   < > 6.0 7.2 7.3 8.9 8.1  NEUTROABS 4.8  --   --   --   --   --   --   HGB 10.9*   < > 10.2* 10.4* 9.7* 10.2* 9.7*  HCT 33.7*   < > 31.3* 31.1* 28.6* 30.6* 28.9*  MCV 94.4   < > 92.6 92.8 90.8 92.2  92.0  PLT 178   < > 192 193 177 235 207   < > = values in this interval not displayed.   Blood Culture    Component Value Date/Time   SDES  02/19/2020 2038    URINE, RANDOM Performed at [facility], [address] [place], [place] [address]    [facility]  02/19/2020 2038    NONE Performed at [facility], [address] [place], [place] [address]    CULT >=100,000 COLONIES/mL ESCHERICHIA COLI (A) 02/19/2020 2038   REPTSTATUS 02/22/2020 FINAL 02/19/2020 2038    Cardiac Enzymes: No results for input(s): CKTOTAL, CKMB, CKMBINDEX, TROPONINI in the last 168 hours. CBG: Recent Labs  Lab 09/12/24 0425 09/12/24 0535  GLUCAP 97 97   Iron Studies: No results for input(s): IRON, TIBC, TRANSFERRIN, FERRITIN in the last 72 hours. Lab Results  Component Value Date   INR 1.0 09/12/2024   INR 1.0 07/25/2020   Studies/Results: CARDIAC CATHETERIZATION Addendum Date: 09/16/2024 Coronary intervention 09/16/2024: (Please see detailed coronary  angiogram 09/14/2024, except that ostial and distal LM stenosis is 20-30%, not 75%) RCA: Ostial 50%, proxmial 90% stenoses           Successful percutaneous coronary intervention prox RCA       Intravascular ultrasound (IVUS)       PTCA and stent placement 3.5 X 24 mm Synergy drug-eluting stent, deployed at 16 atm       Post dilatation using 3/75 X 12 mm [place] balloon up to 20 atm       0% residual stenosis at stented segment, TIMI III flow       Prox RCA post PCI MSA 8.2 mm2       Ostial RCA (unstented) MLA 6.1 mm2 Contrast used: 55 cc Accidental air entry in the coronary without any slow flow No neurodeficit Headache was present before accidental air entry and unlikely to related Continue to monitor the patient overnight in telemetry bed Leah JINNY Lawrence, MD   Result Date: 09/16/2024 Images from the original result were not included. Coronary intervention 09/16/2024: (Please see detailed coronary angiogram 09/14/2024,  except that ostial and distal LM stenosis is 20-30%, not 75%) RCA: Ostial 50%, proxmial 90% stenoses           Successful percutaneous coronary intervention prox RCA       Intravascular ultrasound (IVUS)       PTCA and stent placement 3.5 X 24 mm Synergy drug-eluting stent, deployed at 16 atm       Post dilatation using 3/75 X 12 mm [place] balloon up to 20 atm       0% residual stenosis at stented segment, TIMI III flow       Prox RCA post PCI MSA 8.2 mm2       Ostial RCA (unstented) MLA 6.1 mm2 Contrast used: 55 cc Accidental air entry in the coronary without any slow flow No neurodeficit Headache was present before accidental air entry and unlikely to related Continue to monitor the patient overnight in telemetry bed Leah JINNY Lawrence, MD   Medications:   aspirin  EC  81 mg Oral Daily   atorvastatin   80 mg Oral Daily   free water   250 mL Oral Once   metoprolol  succinate  25 mg Oral Daily   pantoprazole   20 mg Oral Daily   sodium chloride  flush  3 mL Intravenous Q12H   ticagrelor   90 mg Oral BID    Dialysis Orders:  MWF [facility]   Assessment/Plan: NSTEMI: w/ LHC showing 3V CAD, 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 Transferred here for CABG evaluation. Cardiology following ESRD: on HD MWF. Missed a week of HD while out of town. Admitted 12/13 w/ [facility] and was dialyzed on 12/13 then again on 12/15 at [facility]. 1.7L was removed with HD yesterday. Next HD 12/22 HTN: bp's are normal to low-normal. Getting low-dose coreg  and procardia  xl 30 every day.  Volume: euvolemic on exam.  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 Current labs and volume are stable. Patient's daughter confirmed her next HD is on Monday. From a renal standpoint, okay to discharge today and she can resume treatment on 12/22.  Leah Piety, NP [facility] 09/18/2024,11:15 AM  LOS: 4 days    "

## 2024-09-18 NOTE — Progress Notes (Signed)
 Patient refuses her scheduled hemodialysis session at this time due to abdominal pain and multiple episodes of loose stools. Patient reports she does not feel well enough to proceed with dialysis at this time and verbalizes preference to wait for an available schedule in the morning. Patient education provided on the importance of receiving her scheduled HD and informed that availability of dialysis slot in the morning cannot be guaranteed due to large inpatient HD census. Risk and potential complications of delaying dialysis were explained. Patient verbalized understanding but continues to refuse. Patient is alert and oriented x4. Provider on call notified.

## 2024-09-20 NOTE — Progress Notes (Signed)
 Late note entry 12/22 0936 am  D/c over weekend noted. Contacted clinic [facility] to inform of this, their phones are still down. Left a confidential message for clinic manager informing of pt d/c and anticipated arrival. D/c summ and last neph note faxed at this time. No further support needed.   Breuna Loveall Dialysis Navigator [phone]

## 2024-10-18 ENCOUNTER — Other Ambulatory Visit (HOSPITAL_COMMUNITY): Payer: Self-pay

## 2024-11-16 ENCOUNTER — Encounter: Admission: RE | Payer: Self-pay | Source: Home / Self Care

## 2024-11-16 ENCOUNTER — Ambulatory Visit: Admission: RE | Admit: 2024-11-16 | Source: Home / Self Care | Admitting: Ophthalmology

## 2024-11-16 SURGERY — PHACOEMULSIFICATION, CATARACT, WITH IOL INSERTION
Anesthesia: Topical | Laterality: Right

## 2024-11-30 ENCOUNTER — Ambulatory Visit: Admit: 2024-11-30 | Admitting: Ophthalmology

## 2024-11-30 SURGERY — PHACOEMULSIFICATION, CATARACT, WITH IOL INSERTION
Anesthesia: Topical | Laterality: Left
